# Patient Record
Sex: Female | Born: 1960 | Race: White | Hispanic: No | Marital: Married | Smoking: Former smoker
Health system: Southern US, Community
[De-identification: ages and names within clinical notes are randomized; demographics above are authoritative.]

## PROBLEM LIST (undated history)

## (undated) ENCOUNTER — Emergency Department (HOSPITAL_COMMUNITY): Admission: EM | Payer: Medicaid Other | Source: Home / Self Care

## (undated) ENCOUNTER — Emergency Department (HOSPITAL_COMMUNITY): Discharge status: Against Medical Advice | Payer: Self-pay

## (undated) DIAGNOSIS — G8929 Other chronic pain: Secondary | ICD-10-CM

## (undated) DIAGNOSIS — F329 Major depressive disorder, single episode, unspecified: Secondary | ICD-10-CM

## (undated) DIAGNOSIS — I639 Cerebral infarction, unspecified: Secondary | ICD-10-CM

## (undated) DIAGNOSIS — R569 Unspecified convulsions: Secondary | ICD-10-CM

## (undated) DIAGNOSIS — F32A Depression, unspecified: Secondary | ICD-10-CM

## (undated) DIAGNOSIS — F319 Bipolar disorder, unspecified: Secondary | ICD-10-CM

## (undated) DIAGNOSIS — F1210 Cannabis abuse, uncomplicated: Secondary | ICD-10-CM

## (undated) DIAGNOSIS — M797 Fibromyalgia: Secondary | ICD-10-CM

## (undated) DIAGNOSIS — R51 Headache: Secondary | ICD-10-CM

## (undated) DIAGNOSIS — K599 Functional intestinal disorder, unspecified: Secondary | ICD-10-CM

## (undated) DIAGNOSIS — J449 Chronic obstructive pulmonary disease, unspecified: Secondary | ICD-10-CM

## (undated) DIAGNOSIS — F4310 Post-traumatic stress disorder, unspecified: Secondary | ICD-10-CM

## (undated) DIAGNOSIS — R160 Hepatomegaly, not elsewhere classified: Secondary | ICD-10-CM

## (undated) DIAGNOSIS — K55039 Acute (reversible) ischemia of large intestine, extent unspecified: Secondary | ICD-10-CM

## (undated) DIAGNOSIS — K219 Gastro-esophageal reflux disease without esophagitis: Secondary | ICD-10-CM

## (undated) DIAGNOSIS — M1990 Unspecified osteoarthritis, unspecified site: Secondary | ICD-10-CM

## (undated) DIAGNOSIS — D509 Iron deficiency anemia, unspecified: Secondary | ICD-10-CM

## (undated) DIAGNOSIS — G459 Transient cerebral ischemic attack, unspecified: Secondary | ICD-10-CM

## (undated) DIAGNOSIS — R519 Headache, unspecified: Secondary | ICD-10-CM

## (undated) DIAGNOSIS — J852 Abscess of lung without pneumonia: Secondary | ICD-10-CM

## (undated) HISTORY — DX: Abscess of lung without pneumonia: J85.2

## (undated) HISTORY — DX: Post-traumatic stress disorder, unspecified: F43.10

## (undated) HISTORY — DX: Headache: R51

## (undated) HISTORY — DX: Cannabis abuse, uncomplicated: F12.10

## (undated) HISTORY — PX: VEIN SURGERY: SHX48

## (undated) HISTORY — DX: Bipolar disorder, unspecified: F31.9

## (undated) HISTORY — DX: Chronic obstructive pulmonary disease, unspecified: J44.9

## (undated) HISTORY — DX: Gastro-esophageal reflux disease without esophagitis: K21.9

## (undated) HISTORY — DX: Transient cerebral ischemic attack, unspecified: G45.9

## (undated) HISTORY — DX: Iron deficiency anemia, unspecified: D50.9

## (undated) HISTORY — DX: Other chronic pain: G89.29

## (undated) HISTORY — DX: Hepatomegaly, not elsewhere classified: R16.0

## (undated) HISTORY — PX: COLONOSCOPY: SHX174

## (undated) HISTORY — DX: Functional intestinal disorder, unspecified: K59.9

## (undated) HISTORY — DX: Headache, unspecified: R51.9

---

## 2007-02-16 ENCOUNTER — Emergency Department (HOSPITAL_COMMUNITY): Admission: EM | Admit: 2007-02-16 | Discharge: 2007-02-16 | Payer: Self-pay | Admitting: Emergency Medicine

## 2007-02-16 ENCOUNTER — Ambulatory Visit: Payer: Self-pay | Admitting: *Deleted

## 2007-03-03 DIAGNOSIS — I639 Cerebral infarction, unspecified: Secondary | ICD-10-CM

## 2007-03-03 HISTORY — DX: Cerebral infarction, unspecified: I63.9

## 2007-03-18 ENCOUNTER — Emergency Department (HOSPITAL_COMMUNITY): Admission: EM | Admit: 2007-03-18 | Discharge: 2007-03-18 | Payer: Self-pay | Admitting: Emergency Medicine

## 2007-04-01 ENCOUNTER — Emergency Department (HOSPITAL_COMMUNITY): Admission: EM | Admit: 2007-04-01 | Discharge: 2007-04-01 | Payer: Self-pay | Admitting: Emergency Medicine

## 2007-04-01 ENCOUNTER — Emergency Department (HOSPITAL_COMMUNITY): Admission: EM | Admit: 2007-04-01 | Discharge: 2007-04-01 | Payer: Self-pay | Admitting: Family Medicine

## 2007-04-22 ENCOUNTER — Ambulatory Visit: Payer: Self-pay | Admitting: Internal Medicine

## 2007-05-11 ENCOUNTER — Encounter (INDEPENDENT_AMBULATORY_CARE_PROVIDER_SITE_OTHER): Payer: Self-pay | Admitting: Internal Medicine

## 2007-05-11 ENCOUNTER — Ambulatory Visit: Payer: Self-pay | Admitting: Family Medicine

## 2007-05-11 LAB — CONVERTED CEMR LAB
ALT: 12 units/L (ref 0–35)
AST: 17 units/L (ref 0–37)
Basophils Absolute: 0 10*3/uL (ref 0.0–0.1)
Basophils Relative: 0 % (ref 0–1)
Benzodiazepines.: NEGATIVE
Chloride: 104 meq/L (ref 96–112)
Creatinine, Ser: 0.66 mg/dL (ref 0.40–1.20)
Hemoglobin: 16.2 g/dL — ABNORMAL HIGH (ref 12.0–15.0)
MCHC: 33.2 g/dL (ref 30.0–36.0)
Methadone: NEGATIVE
Monocytes Absolute: 1 10*3/uL (ref 0.1–1.0)
Neutro Abs: 5.6 10*3/uL (ref 1.7–7.7)
Platelets: 228 10*3/uL (ref 150–400)
Propoxyphene: NEGATIVE
RDW: 15.2 % (ref 11.5–15.5)
TSH: 2.596 microintl units/mL (ref 0.350–5.50)
Total Bilirubin: 0.5 mg/dL (ref 0.3–1.2)
Total CHOL/HDL Ratio: 2.6
VLDL: 22 mg/dL (ref 0–40)

## 2007-05-14 ENCOUNTER — Emergency Department (HOSPITAL_COMMUNITY): Admission: EM | Admit: 2007-05-14 | Discharge: 2007-05-14 | Payer: Self-pay | Admitting: Emergency Medicine

## 2007-05-18 ENCOUNTER — Ambulatory Visit: Payer: Self-pay | Admitting: Family Medicine

## 2007-06-17 ENCOUNTER — Encounter (INDEPENDENT_AMBULATORY_CARE_PROVIDER_SITE_OTHER): Payer: Self-pay | Admitting: Nurse Practitioner

## 2007-06-17 LAB — CONVERTED CEMR LAB
ALT: 8 units/L (ref 0–35)
AST: 13 units/L (ref 0–37)
Basophils Absolute: 0 10*3/uL (ref 0.0–0.1)
CO2: 22 meq/L (ref 19–32)
Chlamydia, Swab/Urine, PCR: POSITIVE — AB
Chloride: 106 meq/L (ref 96–112)
Cholesterol: 160 mg/dL (ref 0–200)
Eosinophils Relative: 5 % (ref 0–5)
GC Probe Amp, Urine: NEGATIVE
LH: 2.1 milliintl units/mL
Lymphocytes Relative: 37 % (ref 12–46)
Neutro Abs: 6.5 10*3/uL (ref 1.7–7.7)
Platelets: 366 10*3/uL (ref 150–400)
RDW: 13.5 % (ref 11.5–15.5)
Sodium: 139 meq/L (ref 135–145)
TSH: 2.351 microintl units/mL (ref 0.350–5.50)
Total Bilirubin: 0.4 mg/dL (ref 0.3–1.2)
Total Protein: 8.2 g/dL (ref 6.0–8.3)
VLDL: 10 mg/dL (ref 0–40)

## 2007-07-08 ENCOUNTER — Ambulatory Visit: Payer: Self-pay | Admitting: Internal Medicine

## 2007-07-11 ENCOUNTER — Ambulatory Visit: Payer: Self-pay | Admitting: Internal Medicine

## 2007-07-18 ENCOUNTER — Ambulatory Visit: Payer: Self-pay | Admitting: Internal Medicine

## 2007-07-20 ENCOUNTER — Ambulatory Visit (HOSPITAL_COMMUNITY): Admission: RE | Admit: 2007-07-20 | Discharge: 2007-07-20 | Payer: Self-pay | Admitting: Family Medicine

## 2007-07-22 ENCOUNTER — Ambulatory Visit (HOSPITAL_COMMUNITY): Admission: RE | Admit: 2007-07-22 | Discharge: 2007-07-22 | Payer: Self-pay | Admitting: Internal Medicine

## 2007-08-16 ENCOUNTER — Emergency Department (HOSPITAL_COMMUNITY): Admission: EM | Admit: 2007-08-16 | Discharge: 2007-08-16 | Payer: Self-pay | Admitting: Emergency Medicine

## 2007-08-18 ENCOUNTER — Encounter: Payer: Self-pay | Admitting: Family Medicine

## 2007-08-18 ENCOUNTER — Ambulatory Visit: Payer: Self-pay | Admitting: Internal Medicine

## 2007-08-18 LAB — CONVERTED CEMR LAB: Chlamydia, DNA Probe: NEGATIVE

## 2007-09-27 ENCOUNTER — Emergency Department (HOSPITAL_COMMUNITY): Admission: EM | Admit: 2007-09-27 | Discharge: 2007-09-27 | Payer: Self-pay | Admitting: Family Medicine

## 2007-09-28 ENCOUNTER — Ambulatory Visit (HOSPITAL_COMMUNITY): Admission: RE | Admit: 2007-09-28 | Discharge: 2007-09-28 | Payer: Self-pay | Admitting: Family Medicine

## 2007-10-25 ENCOUNTER — Ambulatory Visit (HOSPITAL_COMMUNITY): Admission: RE | Admit: 2007-10-25 | Discharge: 2007-10-25 | Payer: Self-pay | Admitting: Family Medicine

## 2007-12-09 ENCOUNTER — Emergency Department (HOSPITAL_COMMUNITY): Admission: EM | Admit: 2007-12-09 | Discharge: 2007-12-09 | Payer: Self-pay | Admitting: Family Medicine

## 2008-03-28 ENCOUNTER — Encounter: Admission: RE | Admit: 2008-03-28 | Discharge: 2008-03-28 | Payer: Self-pay | Admitting: Family Medicine

## 2008-04-18 ENCOUNTER — Encounter: Payer: Self-pay | Admitting: Pulmonary Disease

## 2008-04-26 ENCOUNTER — Encounter: Payer: Self-pay | Admitting: Pulmonary Disease

## 2008-05-01 ENCOUNTER — Encounter: Payer: Self-pay | Admitting: Pulmonary Disease

## 2008-05-02 ENCOUNTER — Emergency Department (HOSPITAL_COMMUNITY): Admission: EM | Admit: 2008-05-02 | Discharge: 2008-05-02 | Payer: Self-pay | Admitting: Emergency Medicine

## 2008-05-04 ENCOUNTER — Emergency Department (HOSPITAL_COMMUNITY): Admission: EM | Admit: 2008-05-04 | Discharge: 2008-05-04 | Payer: Self-pay | Admitting: Emergency Medicine

## 2008-05-11 ENCOUNTER — Ambulatory Visit: Payer: Self-pay | Admitting: Family Medicine

## 2008-05-14 ENCOUNTER — Encounter: Admission: RE | Admit: 2008-05-14 | Discharge: 2008-05-14 | Payer: Self-pay | Admitting: Neurology

## 2008-08-16 ENCOUNTER — Ambulatory Visit (HOSPITAL_COMMUNITY): Admission: RE | Admit: 2008-08-16 | Discharge: 2008-08-16 | Payer: Self-pay | Admitting: Family Medicine

## 2008-08-25 ENCOUNTER — Emergency Department (HOSPITAL_COMMUNITY): Admission: EM | Admit: 2008-08-25 | Discharge: 2008-08-25 | Payer: Self-pay | Admitting: Family Medicine

## 2008-08-26 ENCOUNTER — Inpatient Hospital Stay (HOSPITAL_COMMUNITY): Admission: EM | Admit: 2008-08-26 | Discharge: 2008-08-29 | Payer: Self-pay | Admitting: Emergency Medicine

## 2008-09-01 ENCOUNTER — Encounter: Payer: Self-pay | Admitting: Pulmonary Disease

## 2008-09-13 ENCOUNTER — Encounter: Payer: Self-pay | Admitting: Pulmonary Disease

## 2008-09-19 ENCOUNTER — Ambulatory Visit: Payer: Self-pay | Admitting: Internal Medicine

## 2008-09-21 ENCOUNTER — Encounter: Payer: Self-pay | Admitting: Pulmonary Disease

## 2008-09-24 ENCOUNTER — Ambulatory Visit: Payer: Self-pay | Admitting: Pulmonary Disease

## 2008-09-24 DIAGNOSIS — G459 Transient cerebral ischemic attack, unspecified: Secondary | ICD-10-CM | POA: Insufficient documentation

## 2008-09-24 DIAGNOSIS — J449 Chronic obstructive pulmonary disease, unspecified: Secondary | ICD-10-CM | POA: Insufficient documentation

## 2008-09-24 DIAGNOSIS — F4310 Post-traumatic stress disorder, unspecified: Secondary | ICD-10-CM | POA: Insufficient documentation

## 2008-09-24 DIAGNOSIS — R519 Headache, unspecified: Secondary | ICD-10-CM | POA: Insufficient documentation

## 2008-09-24 DIAGNOSIS — Z8679 Personal history of other diseases of the circulatory system: Secondary | ICD-10-CM | POA: Insufficient documentation

## 2008-09-24 DIAGNOSIS — R930 Abnormal findings on diagnostic imaging of skull and head, not elsewhere classified: Secondary | ICD-10-CM

## 2008-09-24 DIAGNOSIS — R51 Headache: Secondary | ICD-10-CM | POA: Insufficient documentation

## 2008-09-24 DIAGNOSIS — J45909 Unspecified asthma, uncomplicated: Secondary | ICD-10-CM | POA: Insufficient documentation

## 2008-09-26 ENCOUNTER — Ambulatory Visit: Admission: RE | Admit: 2008-09-26 | Discharge: 2008-09-26 | Payer: Self-pay | Admitting: Pulmonary Disease

## 2008-09-26 ENCOUNTER — Encounter: Payer: Self-pay | Admitting: Pulmonary Disease

## 2008-09-26 ENCOUNTER — Ambulatory Visit: Payer: Self-pay | Admitting: Pulmonary Disease

## 2008-09-28 ENCOUNTER — Telehealth: Payer: Self-pay | Admitting: Pulmonary Disease

## 2008-10-01 ENCOUNTER — Ambulatory Visit: Payer: Self-pay | Admitting: Internal Medicine

## 2008-10-02 ENCOUNTER — Telehealth (INDEPENDENT_AMBULATORY_CARE_PROVIDER_SITE_OTHER): Payer: Self-pay | Admitting: *Deleted

## 2008-10-03 ENCOUNTER — Ambulatory Visit: Payer: Self-pay | Admitting: Pulmonary Disease

## 2008-10-24 ENCOUNTER — Ambulatory Visit: Payer: Self-pay | Admitting: Pulmonary Disease

## 2008-10-24 DIAGNOSIS — J852 Abscess of lung without pneumonia: Secondary | ICD-10-CM | POA: Insufficient documentation

## 2008-10-24 DIAGNOSIS — R222 Localized swelling, mass and lump, trunk: Secondary | ICD-10-CM

## 2008-10-30 ENCOUNTER — Ambulatory Visit (HOSPITAL_COMMUNITY): Admission: RE | Admit: 2008-10-30 | Discharge: 2008-10-30 | Payer: Self-pay | Admitting: Pulmonary Disease

## 2008-10-30 ENCOUNTER — Telehealth: Payer: Self-pay | Admitting: Pulmonary Disease

## 2008-10-31 ENCOUNTER — Telehealth: Payer: Self-pay | Admitting: Pulmonary Disease

## 2008-10-31 ENCOUNTER — Encounter: Payer: Self-pay | Admitting: Pulmonary Disease

## 2008-11-06 ENCOUNTER — Telehealth: Payer: Self-pay | Admitting: Pulmonary Disease

## 2008-11-07 ENCOUNTER — Ambulatory Visit: Payer: Self-pay | Admitting: Pulmonary Disease

## 2008-11-08 ENCOUNTER — Emergency Department (HOSPITAL_COMMUNITY): Admission: EM | Admit: 2008-11-08 | Discharge: 2008-11-09 | Payer: Self-pay | Admitting: Emergency Medicine

## 2008-11-08 ENCOUNTER — Encounter: Payer: Self-pay | Admitting: Pulmonary Disease

## 2008-11-09 ENCOUNTER — Telehealth (INDEPENDENT_AMBULATORY_CARE_PROVIDER_SITE_OTHER): Payer: Self-pay | Admitting: *Deleted

## 2008-11-12 ENCOUNTER — Ambulatory Visit: Payer: Self-pay | Admitting: Internal Medicine

## 2008-11-15 ENCOUNTER — Ambulatory Visit: Payer: Self-pay | Admitting: Pulmonary Disease

## 2008-11-22 ENCOUNTER — Telehealth: Payer: Self-pay | Admitting: Pulmonary Disease

## 2008-11-26 ENCOUNTER — Ambulatory Visit: Payer: Self-pay | Admitting: Cardiology

## 2008-11-26 ENCOUNTER — Telehealth: Payer: Self-pay | Admitting: Pulmonary Disease

## 2008-11-27 ENCOUNTER — Telehealth (INDEPENDENT_AMBULATORY_CARE_PROVIDER_SITE_OTHER): Payer: Self-pay | Admitting: *Deleted

## 2008-11-27 ENCOUNTER — Telehealth: Payer: Self-pay | Admitting: Pulmonary Disease

## 2008-11-28 ENCOUNTER — Telehealth (INDEPENDENT_AMBULATORY_CARE_PROVIDER_SITE_OTHER): Payer: Self-pay | Admitting: *Deleted

## 2008-12-03 ENCOUNTER — Telehealth: Payer: Self-pay | Admitting: Internal Medicine

## 2008-12-19 ENCOUNTER — Emergency Department (HOSPITAL_COMMUNITY): Admission: EM | Admit: 2008-12-19 | Discharge: 2008-12-20 | Payer: Self-pay | Admitting: Emergency Medicine

## 2009-01-11 ENCOUNTER — Encounter: Payer: Self-pay | Admitting: Pulmonary Disease

## 2009-01-11 ENCOUNTER — Ambulatory Visit: Payer: Self-pay | Admitting: Pulmonary Disease

## 2009-05-09 ENCOUNTER — Ambulatory Visit: Payer: Self-pay | Admitting: Pulmonary Disease

## 2009-05-09 ENCOUNTER — Encounter: Payer: Self-pay | Admitting: Pulmonary Disease

## 2009-06-05 ENCOUNTER — Encounter: Payer: Self-pay | Admitting: Nurse Practitioner

## 2009-07-18 ENCOUNTER — Encounter: Payer: Self-pay | Admitting: Pulmonary Disease

## 2009-08-19 ENCOUNTER — Encounter: Admission: RE | Admit: 2009-08-19 | Discharge: 2009-08-19 | Payer: Self-pay | Admitting: General Practice

## 2009-09-24 ENCOUNTER — Telehealth (INDEPENDENT_AMBULATORY_CARE_PROVIDER_SITE_OTHER): Payer: Self-pay | Admitting: *Deleted

## 2009-09-30 ENCOUNTER — Encounter: Payer: Self-pay | Admitting: Nurse Practitioner

## 2009-11-14 ENCOUNTER — Encounter: Payer: Self-pay | Admitting: Nurse Practitioner

## 2009-11-18 ENCOUNTER — Encounter: Payer: Self-pay | Admitting: Nurse Practitioner

## 2009-11-19 ENCOUNTER — Telehealth (INDEPENDENT_AMBULATORY_CARE_PROVIDER_SITE_OTHER): Payer: Self-pay | Admitting: *Deleted

## 2009-11-19 ENCOUNTER — Encounter (INDEPENDENT_AMBULATORY_CARE_PROVIDER_SITE_OTHER): Payer: Self-pay | Admitting: *Deleted

## 2009-11-20 ENCOUNTER — Ambulatory Visit: Payer: Self-pay | Admitting: Internal Medicine

## 2009-11-20 DIAGNOSIS — D509 Iron deficiency anemia, unspecified: Secondary | ICD-10-CM

## 2009-11-20 DIAGNOSIS — K219 Gastro-esophageal reflux disease without esophagitis: Secondary | ICD-10-CM

## 2009-11-21 ENCOUNTER — Telehealth: Payer: Self-pay | Admitting: Nurse Practitioner

## 2009-11-25 ENCOUNTER — Ambulatory Visit: Payer: Self-pay | Admitting: Internal Medicine

## 2009-11-28 ENCOUNTER — Ambulatory Visit: Payer: Self-pay | Admitting: Pulmonary Disease

## 2010-03-03 ENCOUNTER — Encounter (INDEPENDENT_AMBULATORY_CARE_PROVIDER_SITE_OTHER): Payer: Self-pay | Admitting: *Deleted

## 2010-03-14 ENCOUNTER — Telehealth: Payer: Self-pay | Admitting: Internal Medicine

## 2010-03-14 ENCOUNTER — Encounter (INDEPENDENT_AMBULATORY_CARE_PROVIDER_SITE_OTHER): Payer: Self-pay | Admitting: *Deleted

## 2010-03-23 ENCOUNTER — Encounter: Payer: Self-pay | Admitting: Obstetrics and Gynecology

## 2010-03-24 ENCOUNTER — Encounter (INDEPENDENT_AMBULATORY_CARE_PROVIDER_SITE_OTHER): Payer: Self-pay | Admitting: *Deleted

## 2010-03-24 ENCOUNTER — Encounter: Payer: Self-pay | Admitting: Family Medicine

## 2010-03-24 ENCOUNTER — Encounter: Payer: Self-pay | Admitting: Pulmonary Disease

## 2010-04-01 NOTE — Assessment & Plan Note (Signed)
Summary: rov for copd   Copy to:  [facility] Primary Provider/Referring Provider:  Dr. Alfonse Ras  CC:  Pt is here for a 4 month f/u appt.  Pt states breathing is " alittle" better since last ov- still has sob with exertion.  Pt c/o coughing up clear to yellow sputum and dizziness and "light headedness."  Pt states she is currently smoking greater than 1 ppd.  Pt would like rx for Chantix to help quit smoking. Marland Kitchen  History of Present Illness: the pt comes in today for f/u of her known emphysema and lung abscesses.  Her doe is at its usual baseline, but she is continuing to smoke greater than one ppd.  She has ongoing congestion, with intermittantly discolored mucus.  She does not feel she has a chest cold.  She has not had any further f/c/sweats.  There has been no hemoptysis.  She has continued to put back on her weight that she had lost.  Medications Prior to Update: 1)  Advair Diskus 250-50 Mcg/dose Misc (Fluticasone-Salmeterol) .... Inhale 1 Puff Two Times A Day 2)  Proair Hfa 108 (90 Base) Mcg/act  Aers (Albuterol Sulfate) .Marland Kitchen.. 1-2 Puffs Every 4-6 Hours As Needed 3)  Albuterol Sulfate (2.5 Mg/85ml) 0.083% Nebu (Albuterol Sulfate) .Marland Kitchen.. 1 Vial in Nebulizer Every 4 To 6 Hours As Needed 4)  Spiriva Handihaler 18 Mcg  Caps (Tiotropium Bromide Monohydrate) .... Inhale Contents of 1 Capsule Once A Day 5)  Tizanidine Hcl 4 Mg Tabs (Tizanidine Hcl) .... Take 1 Tablet By Mouth Two Times A Day As Needed 6)  Vistaril 50 Mg Caps (Hydroxyzine Pamoate) .... Take 1 Tablet By Mouth Three Times A Day 7)  Topamax 25 Mg Tabs (Topiramate) .... Take 1 Tab By Mouth 3 To 4 Times Per Day 8)  Xyzal 5 Mg Tabs (Levocetirizine Dihydrochloride) .... Take 1 Tablet By Mouth Once A Day 9)  Ibuprofen 800 Mg Tabs (Ibuprofen) .... Take 1 Tablet By Mouth Three Times A Day 10)  Elestat 0.05 % Soln (Epinastine Hcl) .Marland Kitchen.. 1 Drop in Each Eye Once Daily 11)  Freshkote 2.7-2 % Soln (Polyvin Alc-Povidon-Dimethylam) .... Use As  Needed 12)  Omeprazole 20 Mg Cpdr (Omeprazole) .Marland Kitchen.. 1-2 Tabs Once A Day 13)  Flexeril 10 Mg Tabs (Cyclobenzaprine Hcl) .... Take 1 Tablet By Mouth Two Times A Day As Needed 14)  Sudafed Triple Action 30-325-200 Mg Tabs (Pseudoephed-Apap-Guaifenesin) .... Take 1 Tablet By Mouth Two Times A Day  Allergies (verified): 1)  ! Tylox 2)  ! Codeine 3)  ! Penicillin 4)  ! Clindamycin  Social History: Patient states currently smoker.r. started at age 7.  Currently smoking approx. greater than 1 ppd.   pt is divorced. pt has 4 sons.  2 are alive and 2 deceased. pt is on disability.  previously worked as a Actuary care.   cats  Review of Systems      See HPI  Vital Signs:  Patient profile:   50 year old female Height:      67.5 inches Weight:      151.50 pounds BMI:     23.46 O2 Sat:      94 % on Room air Temp:     98.1 degrees F oral Pulse rate:   84 / minute BP sitting:   120 / 78  (left arm) Cuff size:   regular  Vitals Entered By: Arman Filter LPN (May 09, 2009 3:17 PM)  O2 Flow:  Room air CC: Pt  is here for a 4 month f/u appt.  Pt states breathing is " alittle" better since last ov- still has sob with exertion.  Pt c/o coughing up clear to yellow sputum, dizziness and "light headedness."  Pt states she is currently smoking greater than 1 ppd.  Pt would like rx for Chantix to help quit smoking.  Comments Medications reviewed with patient Arman Filter LPN  May 09, 2009 3:19 PM    Physical Exam  General:  thin female in nad Nose:  no purulence noted. Lungs:  mild decrease in bs, no wheezing Heart:  rrr, no mrg Extremities:  no edema or cyanosis Neurologic:  alert and oriented, moves all 4.   Impression & Recommendations:  Problem # 1:  LUNG ABSCESS (ICD-513.0)  the pt's xray today is much improved, and she denies any symptoms suggestive of ongoing pulmonary infection.  I suspect her current cxr represents her new baseline.  Problem # 2:  C O P D  (ICD-496) the pt is on a very good bronchodilator regimen, but continues to have symptoms due to her ongoing smoking.  I have told her things will never improve until she totally stops smoking.  I am very hesitant to prescribe chantix given her multiple neurologic/psychiatric meds, and have recommended instead nicotine gum and smoking cesssation classes.  I have given her samples of the gum, and instructed her on how to use.  I have also given her invitations to the smoking cessation classes at [facility].  Other Orders: Est. Patient Level III (52841) Tobacco use cessation intermediate 3-10 minutes (32440)  Patient Instructions: 1)  no change in pulmonary meds 2)  you must stop smoking 3)  can try nicorette gum 4mg .  Chew one piece every 1-2 hrs, so that you chew at least 9 pieces a day for the first 6 weeks. 4)  followup with me in 6mos.  Prescriptions: XYZAL 5 MG TABS (LEVOCETIRIZINE DIHYDROCHLORIDE) Take 1 tablet by mouth once a day  #90 x 0   Entered and Authorized by:   Barbaraann Share MD   Signed by:   Barbaraann Share MD on 05/09/2009   Method used:   Print then Give to Patient   RxID:   [id number]

## 2010-04-01 NOTE — Letter (Signed)
Summary: [facility] Instructions  [facility]  [address] [place], [place] [address]   Phone: [phone]  Fax: [phone]       [name]    [birth year]    MRN: [id number]        Procedure Day /Date: 11-25-20     Arrival Time:3:00 PM      Procedure Time: 4:00 PM     Location of Procedure:                    X      [facility] (4th Floor) PREPARATION FOR COLONOSCOPY WITH MOVIPREP   Starting 5 days prior to your procedure 9-21-11do not eat nuts, seeds, popcorn, corn, beans, peas,  salads, or any raw vegetables.  Do not take any fiber supplements (e.g. Metamucil, Citrucel, and Benefiber).  THE DAY BEFORE YOUR PROCEDURE         DATE: 11-24-09  DAY: Sunday  1.  Drink clear liquids the entire day-NO SOLID FOOD  2.  Do not drink anything colored red or purple.  Avoid juices with pulp.  No orange juice.  3.  Drink at least 64 oz. (8 glasses) of fluid/clear liquids during the day to prevent dehydration and help the prep work efficiently.  CLEAR LIQUIDS INCLUDE: Water Jello Ice Popsicles Tea (sugar ok, no milk/cream) Powdered fruit flavored drinks Coffee (sugar ok, no milk/cream) Gatorade Juice: apple, white grape, white cranberry  Lemonade Clear bullion, consomm, broth Carbonated beverages (any kind) Strained chicken noodle soup Hard Candy                             4.  In the morning, mix first dose of MoviPrep solution:    Empty 1 Pouch A and 1 Pouch B into the disposable container    Add lukewarm drinking water to the top line of the container. Mix to dissolve    Refrigerate (mixed solution should be used within 24 hrs)  5.  Begin drinking the prep at 5:00 p.m. The MoviPrep container is divided by 4 marks.   Every 15 minutes drink the solution down to the next mark (approximately 8 oz) until the full liter is complete.   6.  Follow completed prep with 16 oz of clear liquid of your choice (Nothing red or purple).  Continue to drink  clear liquids until bedtime.  7.  Before going to bed, mix second dose of MoviPrep solution:    Empty 1 Pouch A and 1 Pouch B into the disposable container    Add lukewarm drinking water to the top line of the container. Mix to dissolve    Refrigerate  THE DAY OF YOUR PROCEDURE      DATE: 11-25-09 DAY: Monday  Beginning at 11:00 Am  (5 hours before procedure):         1. Every 15 minutes, drink the solution down to the next mark (approx 8 oz) until the full liter is complete.  2. Follow completed prep with 16 oz. of clear liquid of your choice.    3. You may drink clear liquids until 2:00 PM (2 HOURS BEFORE PROCEDURE).   MEDICATION INSTRUCTIONS  Unless otherwise instructed, you should take regular prescription medications with a small sip of water   as early as possible the morning of your procedure.      OTHER INSTRUCTIONS  You will need a responsible adult at least 50 years of  age to accompany you and drive you home.   This person must remain in the waiting room during your procedure.  Wear loose fitting clothing that is easily removed.  Leave jewelry and other valuables at home.  However, you may wish to bring a book to read or  an iPod/MP3 player to listen to music as you wait for your procedure to start.  Remove all body piercing jewelry and leave at home.  Total time from sign-in until discharge is approximately 2-3 hours.  You should go home directly after your procedure and rest.  You can resume normal activities the  day after your procedure.  The day of your procedure you should not:   Drive   Make legal decisions   Operate machinery   Drink alcohol   Return to work  You will receive specific instructions about eating, activities and medications before you leave.    The above instructions have been reviewed and explained to me by   _______________________    I fully understand and can verbalize these instructions _____________________________  Date _________

## 2010-04-01 NOTE — Letter (Signed)
Summary: [facility]  [facility]   Imported By: Lester Fairfield 12/25/2009 10:27:29  _____________________________________________________________________  External Attachment:    Type:   Image     Comment:   External Document

## 2010-04-01 NOTE — Assessment & Plan Note (Signed)
Summary: RIGHT UPPER QUAD PAIN...AS.        (NEW TO GI)   History of Present Illness Visit Type: Initial Consult Primary GI MD: [name] MD Primary Provider: Dr. Alfonse Ras Requesting Provider: Darrin Nipper, MD Chief Complaint: RIght sided abd pain x 5-6 months. Pt states she has been very constipated for 6 months. Pt has taken Miralax, fiber supplements, enemas, MOM without any improvement. Pt did have a abd film done at Dr. Donovan Kail office that showed a completely blocked bowel. Pt started Lactulose, and continued Miralax, and Benefiber with a tiny BM on Saturday but not a complete BM. History of Present Illness:   Patient is a 50 year old female here for evaluation of a 4-5 month history of right mid abdominal pain and constipation. Abdominal xray at [facility] revealed marked stool and gas in right colon (records reviewed). Defecation improves pain to some degree. Taking Mirlax, MOM and Lactulose but still constipated.  No recent medication changes or dietary changes. No rectal bleeding.  Patient takes a daily baby ASA and infrequently takes Ketorolac. She has menstrual cycles every 40 days or so with light bleeding. No weight loss, in fact has gained weight. Some nausea without vomiting. Patient gives history of chronic GERD with regurgitation and heartburn. Takes daily PPI.    GI Review of Systems    Reports abdominal pain, bloating, and  nausea.     Location of  Abdominal pain: right side.    Denies acid reflux, belching, chest pain, dysphagia with liquids, dysphagia with solids, heartburn, loss of appetite, vomiting, vomiting blood, weight loss, and  weight gain.      Reports change in bowel habits and  constipation.     Denies anal fissure, black tarry stools, diarrhea, diverticulosis, fecal incontinence, heme positive stool, hemorrhoids, irritable bowel syndrome, jaundice, light color stool, liver problems, rectal bleeding, and  rectal pain.    Current Medications (verified): 1)   Advair Diskus 250-50 Mcg/dose Misc (Fluticasone-Salmeterol) .... Inhale 1 Puff Two Times A Day 2)  Spiriva Handihaler 18 Mcg  Caps (Tiotropium Bromide Monohydrate) .... Inhale Contents of 1 Capsule Once A Day 3)  Xyzal 5 Mg Tabs (Levocetirizine Dihydrochloride) .... Take 1 Tablet By Mouth Once A Day 4)  Omeprazole 20 Mg Cpdr (Omeprazole) .Marland Kitchen.. 1-2 Tabs Once A Day 5)  Miralax  Powd (Polyethylene Glycol 3350) .... One Capful Once Daily 6)  Benefiber  Powd (Wheat Dextrin) .Marland Kitchen.. 1-2 Capful By Mouth Once Daily 7)  Lactulose  Powd (Lactulose) .... 2 Tbsp  Every 2 Hours 8)  Diazepam 10 Mg Tabs (Diazepam) .... 2 Tablets By Mouth Once Daily As Needed 9)  Cardizem Cd 180 Mg Xr24h-Cap (Diltiazem Hcl Coated Beads) .... One Capsule By Mouth Once Daily 10)  Aspirin 81 Mg  Tabs (Aspirin) .... One Tablet By Mouth Once Daily 11)  Advair Diskus 250-50 Mcg/dose Aepb (Fluticasone-Salmeterol) .... As Needed 12)  B-100  Tabs (Vitamins-Lipotropics) .... One Tablet By Mouth Once Daily 13)  Vitamin B-6 100 Mg Tabs (Pyridoxine Hcl) .... One Tablet By Mouth Once Daily 14)  Vitamin B-12 100 Mcg Tabs (Cyanocobalamin) .... One Tablet By Mouth Once Daily 15)  Vitamin C 500 Mg  Tabs (Ascorbic Acid) .... One Tablet By Mouth Once Daily 16)  Vitamin E 600 Unit  Caps (Vitamin E) .... One Tablet By Mouth Once Daily 17)  Vitamin D 1000 Unit  Tabs (Cholecalciferol) .... One Tablet By Mouth Once Daily 18)  Glucosamine 500 Mg Caps (Glucosamine Sulfate) .... One  Capsule By Mouth Once Daily 19)  Ketorolac Tromethamine 10 Mg Tabs (Ketorolac Tromethamine) .... One Tablet By Mouth Once Daily As Needed  Allergies: 1)  ! Tylox 2)  ! Codeine  Past History:  Past Medical History: HEADACHE, CHRONIC (ICD-784.0) TIA (ICD-435.9)?? POST TRAUMATIC STRESS SYNDROME (ICD-309.81) ANGINA, HX OF (ICD-V12.50) BIPOLAR DISORDER COPD GERD  Past Surgical History: Reviewed history from 10/03/2008 and no changes required. vein transplant 2 C  section  Family History: Reviewed history from 09/24/2008 and no changes required. emphysema: father  asthma: son clotting disorders: mother rheumatism: sister, brother cancer: sister (ovarian/cervical)   Social History: Reviewed history from 05/09/2009 and no changes required. Patient states currently smoker.r. started at age 76.  Currently smoking approx. greater than 1 ppd.   pt is divorced. pt has 4 sons.  2 are alive and 2 deceased. pt is on disability.  previously worked as a Actuary care.   cats  Review of Systems  The patient denies allergy/sinus, anemia, anxiety-new, arthritis/joint pain, back pain, blood in urine, breast changes/lumps, change in vision, confusion, cough, coughing up blood, depression-new, fainting, fatigue, fever, headaches-new, hearing problems, heart murmur, heart rhythm changes, itching, menstrual pain, muscle pains/cramps, night sweats, nosebleeds, pregnancy symptoms, shortness of breath, skin rash, sleeping problems, sore throat, swelling of feet/legs, swollen lymph glands, thirst - excessive , urination - excessive , urination changes/pain, urine leakage, vision changes, and voice change.    Vital Signs:  Patient profile:   50 year old female Height:      67.5 inches Weight:      148.25 pounds BMI:     22.96 Pulse rate:   100 / minute Pulse rhythm:   regular BP sitting:   106 / 64  (left arm) Cuff size:   regular  Vitals Entered By: Christie Nottingham CMA Duncan Dull) (November 20, 2009 3:07 PM)  Physical Exam  General:  Well developed, well nourished, no acute distress. Head:  Normocephalic and atraumatic. Eyes:  Conjunctiva pink, no icterus.  Mouth:  No oral lesions. Tongue moist.  Neck:  ? thyroid Lungs:  Clear throughout to auscultation. Heart:  Regular rate and rhythm; no murmurs, rubs,  or bruits. Abdomen:  Soft, slightly distended with normal bowel sounds. No tenderness, no obvious masses. Rectal:  No external or internal  lesions. Light brown, heme negative stool. Msk:  Symmetrical with no gross deformities. Normal posture. Extremities:  No palmar erythema, no edema.  Neurologic:  Alert and  oriented x4;  grossly normal neurologically. Skin:  Intact without significant lesions or rashes. Cervical Nodes:  No significant cervical adenopathy. Psych:  Alert and cooperative. Normal mood and affect.   Impression & Recommendations:  Problem # 1:  CHANGE IN BOWELS (ZOX-096.04) Assessment New Four to five month history of severe constipation despite Miralax, MOM, and Lactulose. She is hemoccult negative today. Need to exclude colon neoplasm.  For further evaluation the patient will be scheduled for a colonoscopy with biopsies/polypectomy (if indicated).  The risks and benefits of the procedure, as well as alternatives were discussed with the patient and she agrees to proceed. Needs clear liquids for two days prior to procedure as she will likely be difficult to clean out. Add daily Dulcolax suppositories. Will call Dr. Donovan Kail office for copies of recent labwork.   Orders: Colonoscopy (Colon)  Problem # 2:  C O P D (ICD-496) Assessment: Comment Only  Problem # 3:  GERD (ICD-530.81) Assessment: Unchanged Continue daily PPI.  Assessment: New  Patient Instructions: 1)  Continue the Miralax. 17 grams in 8 0z of water or clear juice twice daily, AM and PM.  2)  Use Dulcolax suppositories 1 every morning. 3)  We have scheduled the Colonscopy with Dr [name] for 11-25-09. 4)  Directions and brochure provided. 5)  Be on clear liquids on 9-24 and 9-25.  6)  [facility] Patient Information Guide given to patient. 7)  Copy sent to : Darrin Nipper, MD 8)  The medication list was reviewed and reconciled.  All changed / newly prescribed medications were explained.  A complete medication list was provided to the patient / caregiver. Prescriptions: DULCOLAX 5 MG  TBEC (BISACODYL) Day before procedure  take 2 at 3pm and 2 at 8pm.  #4 x 0   Entered by:   Lowry Ram NCMA   Authorized by:   Willette Cluster NP   Signed by:   Lowry Ram NCMA on 11/20/2009   Method used:   Electronically to        [facility] [phone]* (retail)       [address]       [place], [place]  [address]       Ph: [phone]       Fax: [phone]   RxID:   [id number] REGLAN 10 MG  TABS (METOCLOPRAMIDE HCL) As per prep instructions.  #2 x 0   Entered by:   Lowry Ram NCMA   Authorized by:   Willette Cluster NP   Signed by:   Lowry Ram NCMA on 11/20/2009   Method used:   Electronically to        [facility] [phone] (retail)       [address]       [place], [place]  [address]       Ph: [phone]       Fax: [phone]   RxID:   [id number] MIRALAX   POWD (POLYETHYLENE GLYCOL 3350) As per prep  instructions.  #255gm x 0   Entered by:   Lowry Ram NCMA   Authorized by:   Willette Cluster NP   Signed by:   Lowry Ram NCMA on 11/20/2009   Method used:   Electronically to        [facility] [phone]* (retail)       [address]       [place], [place]  [address]       Ph: [phone]       Fax: [phone]   RxID:   [id number]  Called [facility] and cancelled the above prescriptions.  I ordered Moviprep over the phone to the pharmacist, [facility] [address] Lowry Ram CMA

## 2010-04-01 NOTE — Letter (Signed)
Summary: [facility]  [facility]   Imported By: Lester Nobles 12/25/2009 10:29:13  _____________________________________________________________________  External Attachment:    Type:   Image     Comment:   External Document

## 2010-04-01 NOTE — Assessment & Plan Note (Signed)
Summary: rov for emphysema and h/o lung abscess.   Copy to:  Darrin Nipper, MD Primary Provider/Referring Provider:  Dr. Alfonse Ras  CC:  follow up. Pt states her breathing has been doing a little better. Pt c/o of occasional cough with yellow phlem.  Pt states she is smoking 1 pdd. Pt states the nicorette gum did not help her. Pt states it made her crave nicotine more. Marland Kitchen  History of Present Illness: the pt comes in today for f/u of her known emphysema and h/o lung abscess.  She has been doing well from a lung abscess standpoint, with no further left sided chest pain, and no purulence of significance noted.  She unfortunately continues to smoke, and is taking advair but not spiriva regularly.  She feels that her exertional tolerance is reasonable and stable.    Current Medications (verified): 1)  Advair Diskus 250-50 Mcg/dose Misc (Fluticasone-Salmeterol) .... Inhale 1 Puff Two Times A Day 2)  Spiriva Handihaler 18 Mcg  Caps (Tiotropium Bromide Monohydrate) .... Inhale Contents of 1 Capsule Once A Day 3)  Xyzal 5 Mg Tabs (Levocetirizine Dihydrochloride) .... Take 1 Tablet By Mouth Once A Day 4)  Omeprazole 20 Mg Cpdr (Omeprazole) .Marland Kitchen.. 1-2 Tabs Once A Day 5)  Benefiber  Powd (Wheat Dextrin) .Marland Kitchen.. 1-2 Capful By Mouth Once Daily 6)  Diazepam 10 Mg Tabs (Diazepam) .... 2 Tablets By Mouth Once Daily As Needed 7)  Cardizem Cd 180 Mg Xr24h-Cap (Diltiazem Hcl Coated Beads) .... One Capsule By Mouth Once Daily 8)  Aspirin 81 Mg  Tabs (Aspirin) .... One Tablet By Mouth Once Daily 9)  B-100  Tabs (Vitamins-Lipotropics) .... One Tablet By Mouth Once Daily 10)  Vitamin B-6 100 Mg Tabs (Pyridoxine Hcl) .... One Tablet By Mouth Once Daily 11)  Vitamin B-12 100 Mcg Tabs (Cyanocobalamin) .... One Tablet By Mouth Once Daily 12)  Vitamin C 500 Mg  Tabs (Ascorbic Acid) .... One Tablet By Mouth Once Daily 13)  Vitamin E 600 Unit  Caps (Vitamin E) .... One Tablet By Mouth Once Daily 14)  Vitamin D 1000 Unit  Tabs  (Cholecalciferol) .... One Tablet By Mouth Once Daily 15)  Glucosamine 500 Mg Caps (Glucosamine Sulfate) .... One Capsule By Mouth Once Daily 16)  Ketorolac Tromethamine 10 Mg Tabs (Ketorolac Tromethamine) .... One Tablet By Mouth Once Daily As Needed 17)  Reglan 10 Mg  Tabs (Metoclopramide Hcl) .... As Per Prep Instructions. 18)  Miralax   Powd (Polyethylene Glycol 3350) .... Mix 17 Grams in A Glass of Water, Twice A Day. 19)  Ventolin Hfa 108 (90 Base) Mcg/act Aers (Albuterol Sulfate) .Marland Kitchen.. 1-2 Puffs Every 4-6 Hrs As Needed 20)  Nasonex 50 Mcg/act Susp (Mometasone Furoate) .... 2 Sprays Each Nostrile Once Daily  Allergies (verified): 1)  ! Tylox 2)  ! Codeine  Review of Systems       The patient complains of shortness of breath with activity, shortness of breath at rest, productive cough, non-productive cough, acid heartburn, indigestion, nasal congestion/difficulty breathing through nose, sneezing, and joint stiffness or pain.  The patient denies coughing up blood, chest pain, irregular heartbeats, loss of appetite, weight change, abdominal pain, difficulty swallowing, sore throat, tooth/dental problems, headaches, itching, ear ache, anxiety, depression, hand/feet swelling, rash, change in color of mucus, and fever.    Vital Signs:  Patient profile:   50 year old female Height:      67.5 inches Weight:      147.38 pounds BMI:  22.82 O2 Sat:      97 % on Room air Temp:     98.6 degrees F oral Pulse rate:   96 / minute BP sitting:   130 / 74  (right arm) Cuff size:   regular  Vitals Entered By: Carver Fila (November 28, 2009 4:03 PM)  O2 Flow:  Room air CC: follow up. Pt states her breathing has been doing a little better. Pt c/o of occasional cough with yellow phlem.  Pt states she is smoking 1 pdd. Pt states the nicorette gum did not help her. Pt states it made her crave nicotine more.  Comments meds and allergies updated Phone number updated Carver Fila  November 28, 2009  4:03 PM    Physical Exam  General:  thin female in nad Lungs:  decreased bs, no wheezing, no rhonchi Heart:  rrr, no mrg Extremities:  no edema or cyanosis  Neurologic:  alert and oriented, moves all 4.   Impression & Recommendations:  Problem # 1:  C O P D (ICD-496) the pt continues to smoke, and needs to be more compliant with her bronchodilator use.  Despite this, she appears to be near her usual baseline.  I have encouraged her again to stop smoking, and also asked her to be working on some type of exercise program.  She needs to also increase her calorie intake.  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  Will check one more cxr for completeness.  Medications Added to Medication List This Visit: 1)  Ventolin Hfa 108 (90 Base) Mcg/act Aers (Albuterol sulfate) .Marland Kitchen.. 1-2 puffs every 4-6 hrs as needed 2)  Nasonex 50 Mcg/act Susp (Mometasone furoate) .... 2 sprays each nostrile once daily  Other Orders: Est. Patient Level III ([address]) Admin 1st Vaccine (60454) Flu Vaccine 63yrs + (09811) T-2 View CXR (71020TC)  Patient Instructions: 1)  will check cxr today, and call you with results. 2)  will give you the flu shot 3)  stop smoking!!! 4)  stay on spiriva and advair every day.   5)  followup with me in 6mos.   Immunization History:  Influenza Immunization History:    Influenza:  historical (01/11/2009)         Flu Vaccine Consent Questions     Do you have a history of severe allergic reactions to this vaccine? no    Any prior history of allergic reactions to egg and/or gelatin? no    Do you have a sensitivity to the preservative Thimersol? no    Do you have a past history of Guillan-Barre Syndrome? no    Do you currently have an acute febrile illness? no    Have you ever had a severe reaction to latex? no    Vaccine information given and explained to patient? yes    Are you currently pregnant? no    Lot Number:[id number]   Exp  Date:08/30/2010   Site Given  Left Deltoid IMflu   Carver Fila  November 28, 2009 4:36 PM

## 2010-04-01 NOTE — Letter (Signed)
Summary: New Patient letter  [facility]  [address] [place], [place] [address]   Phone: [phone]  Fax: [phone]       11/19/2009 MRN: [id number]  [name] [address] [place], [place]  [address]  Dear [name],  Welcome to the Gastroenterology Division at [facility].    You are scheduled to see Dr. Jarold Motto on 01/03/2010 at 9:30AM on the 3rd floor at [facility], [address]  We ask that you try to arrive at our office 15 minutes prior to your appointment time to allow for check-in.  We would like you to complete the enclosed self-administered evaluation form prior to your visit and bring it with you on the day of your appointment.  We will review it with you.  Also, please bring a complete list of all your medications or, if you prefer, bring the medication bottles and we will list them.  Please bring your insurance card so that we may make a copy of it.  If your insurance requires a referral to see a specialist, please bring your referral form from your primary care physician.  Co-payments are due at the time of your visit and may be paid by cash, check or credit card.     Your office visit will consist of a consult with your physician (includes a physical exam), any laboratory testing he/she may order, scheduling of any necessary diagnostic testing (e.g. x-ray, ultrasound, CT-scan), and scheduling of a procedure (e.g. Endoscopy, Colonoscopy) if required.  Please allow enough time on your schedule to allow for any/all of these possibilities.    If you cannot keep your appointment, please call [phone] to cancel or reschedule prior to your appointment date.  This allows [place] the opportunity to schedule an appointment for another patient in need of care.  If you do not cancel or reschedule by 5 p.m. the business day prior to your appointment date, you will be charged a $50.00 late cancellation/no-show fee.    Thank you for choosing  [facility] for your medical needs.  We appreciate the opportunity to care for you.  Please visit [place] at our website  to learn more about our practice.                     Sincerely,                                                             The Gastroenterology Division

## 2010-04-01 NOTE — Procedures (Signed)
Summary: Colonoscopy  Patient: Sya Nestler Note: All result statuses are Final unless otherwise noted.  Tests: (1) Colonoscopy (COL)   COL Colonoscopy           DONE     [facility]     [address]     [place], [place]  [address]           COLONOSCOPY PROCEDURE REPORT           PATIENT:  Jernee, Murtaugh  MR#:  [id number]     BIRTHDATE:  [birth year], 49 yrs. old  GENDER:  female     ENDOSCOPIST:  Hedwig Morton. Juanda Chance, MD     REF. BY: DR Alfonse Ras, Dr Dell Ponto     PROCEDURE DATE:  11/25/2009     PROCEDURE:  Colonoscopy [address]     ASA CLASS:  Class II     INDICATIONS:  Abdominal pain, Abnormal CT of abdomen constipationx     5 months, RUQ abd. pain     MEDICATIONS:   Versed 10 mg, Fentanyl 125 mcg, Benadryl 25 mg           DESCRIPTION OF PROCEDURE:   After the risks benefits and     alternatives of the procedure were thoroughly explained, informed     consent was obtained.  No rectal exam performed. The LB [id number]-[id number]     [id number] endoscope was introduced through the anus and advanced to     the cecum, which was identified by both the appendix and ileocecal     valve, without limitations.  The quality of the prep was good,     using MiraLax.  The instrument was then slowly withdrawn as the     colon was fully examined.     <<PROCEDUREIMAGES>>           FINDINGS:  No polyps or cancers were seen (see image1, image2,     image3, image4, image5, image6, image7, image8, and image9).     mildly dilated colon, redundant especially right side and splenic     flexure, difficult intubation   Retroflexed views in the rectum     revealed no abnormalities.    The scope was then withdrawn from     the patient and the procedure completed.           COMPLICATIONS:  None     ENDOSCOPIC IMPRESSION:     1) No polyps or cancers     redundant slightly dilated colon, suspect colonic inertia     RECOMMENDATIONS:     1) high fiber diet     continue Miralax 17gm qd or bid, consider Amitiza     needs an  OV follow up to modify her bowl program.     REPEAT EXAM:  In 10 year(s) for.           ______________________________     Hedwig Morton. Juanda Chance, MD           CC:           n.     eSIGNED:   Hedwig Morton. Brodie at 11/25/2009 05:22 PM           Otilio Saber, [id number]  Note: An exclamation mark (!) indicates a result that was not dispersed into the flowsheet. Document Creation Date: 11/25/2009 5:23 PM _______________________________________________________________________  (1) Order result status: Final Collection or observation date-time: 11/25/2009 17:10 Requested date-time:  Receipt date-time:  Reported date-time:  Referring Physician:   Ordering Physician: Verlee Monte  Juanda Chance [phone]) Specimen Source:  Source: Launa Grill Order Number: [id number] Lab site:   Appended Document: Colonoscopy    Clinical Lists Changes  Observations: Added new observation of COLONNXTDUE: 11/2019 (11/25/2009 17:50)

## 2010-04-01 NOTE — Progress Notes (Signed)
Summary: Triage  Phone Note Call from Patient Call back at Home Phone [phone]   Caller: Patient Call For: Willette Cluster Reason for Call: Talk to Nurse Summary of Call: pt is sch'd for COL on 11-25-09 and has some questions about her prep and being "impacted" Initial call taken by: Karna Christmas,  November 21, 2009 2:03 PM  Follow-up for Phone Call        Pt had questions about takint Lactulose along with the Miralax.  Gunnar Fusi said she could but not to take the Lactulose on Sun when you do the prep. Follow-up by: Joselyn Glassman,  November 21, 2009 4:38 PM

## 2010-04-01 NOTE — Miscellaneous (Signed)
Summary: Orders Update  Clinical Lists Changes  Orders: Added new Test order of T-2 View CXR (71020TC) - Signed 

## 2010-04-01 NOTE — Miscellaneous (Signed)
Summary: Miralax RX  Clinical Lists Changes  Medications: Added new medication of MIRALAX   POWD (POLYETHYLENE GLYCOL 3350) Mix 17 grams in a glass of water, twice a day. - Signed Rx of MIRALAX   POWD (POLYETHYLENE GLYCOL 3350) Mix 17 grams in a glass of water, twice a day.;  #522 grams x 12;  Signed;  Entered by: Durwin Glaze RN;  Authorized by: Hart Carwin MD;  Method used: Electronically to [facility] [address] [phone]*, [address] [place], [place]  [address], Ph: [phone], Fax: [phone]    Prescriptions: MIRALAX   POWD (POLYETHYLENE GLYCOL 3350) Mix 17 grams in a glass of water, twice a day.  #522 grams x 12   Entered by:   Durwin Glaze RN   Authorized by:   Hart Carwin MD   Signed by:   Durwin Glaze RN on 11/25/2009   Method used:   Electronically to        [facility] [address] [phone]* (retail)       [address]       [place], [place]  [address]       Ph: [phone]       Fax: [phone]   RxID:   [id number]

## 2010-04-01 NOTE — Letter (Signed)
Summary: [facility]  [facility]   Imported By: Lester [place] 12/25/2009 10:30:43  _____________________________________________________________________  External Attachment:    Type:   Image     Comment:   External Document

## 2010-04-01 NOTE — Progress Notes (Signed)
Summary: Triage-Sooner Appt.  Phone Note Call from Patient Call back at Home Phone [phone]   Caller: Patient Call For:  any doc Reason for Call: Talk to Nurse Summary of Call: Patient wants to be seen sooner than 11-4 for severe abd and rectal pain and Lactulose is not helping her. Initial call taken by: Tawni Levy,  November 19, 2009 1:45 PM  Follow-up for Phone Call        Message left for patient to callback. Laureen Ochs LPN  November 19, 2009 1:55 PM   Pt'a appt. has been moved to 11-20-09 at 3pm. Pt. advised of med.list/co-pay. If symptoms become worse call back immediately or go to ER.  Follow-up by: Laureen Ochs LPN,  November 19, 2009 2:07 PM

## 2010-04-01 NOTE — Progress Notes (Signed)
Summary: pulmonary rehab  Phone Note Call from Patient Call back at Home Phone [phone]   Caller: Patient Call For: clance Summary of Call: pt wants to see about getting into pulmonary rehab Initial call taken by: Lacinda Axon,  September 24, 2009 12:14 PM  Follow-up for Phone Call        called and spoke with pt.  pt would like to be referred to pulm rehab at [facility].  will forward message to [facility] to address.  Aundra Millet Reynolds LPN  September 24, 2009 12:36 PM   Additional Follow-up for Phone Call Additional follow up Details #1::        I do not see where we have ever done breathing tests on her.  You can only qualify for pulmonary rehab with significant lung disease.  please see if she has ever had pfts in the past..Marland KitchenI can;t find in computer.   Additional Follow-up by: Barbaraann Share MD,  September 24, 2009 12:56 PM    Additional Follow-up for Phone Call Additional follow up Details #2::    called and spoke with pt and she stated that she had a breathing test done in the last month at Dr. Louanna Raw office---called Dr. [name] office and they  will have to look for this and will fax it to [place]. Randell Loop CMA  September 24, 2009 2:17 PM    received paperwork and put in [facility] very important look at folder.  Aundra Millet Reynolds LPN  September 24, 2009 3:10 PM   Additional Follow-up for Phone Call Additional follow up Details #3:: Details for Additional Follow-up Action Taken: pfts reviewed.  Is a candidate for pulmonary rehab.  let her know I will send an order to [facility]  Called, spoke with pt.  Pt informed per [facility], she is a canadiate for pulmonary rehab and order was sent to [facility] for this.  She verbalized understanding. Gweneth Dimitri RN  September 26, 2009 8:44 AM

## 2010-04-03 NOTE — Progress Notes (Signed)
Summary: Triage  Phone Note Call from Patient Call back at Home Phone [phone]   Caller: Patient Call For: Dr. Juanda Chance Reason for Call: Talk to Nurse Summary of Call: Pt has an impacted bowel, saw her primary today and had x-rays that showed it, having a lot of pain aswell, wants to speak wtih nurse Initial call taken by: [place] Johnson,  March 14, 2010 4:18 PM  Follow-up for Phone Call        Patient calling because she has "impacted bowel" States she went to her PCP and had an ultrasound that showed she was impacted. States she is taking Miralax two times a day. Pain for the last 2 weeks in her right side in the belly button area that is getting worse. States her last BM was yesterday. Dr. Juanda Chance notified of above. Per Dr. Juanda Chance- Magnesium Citrate 1 bottle daily until bowels are moving better.Patient aware. Follow-up by: Jesse Fall RN,  March 14, 2010 4:54 PM

## 2010-04-16 ENCOUNTER — Encounter (INDEPENDENT_AMBULATORY_CARE_PROVIDER_SITE_OTHER): Payer: Self-pay | Admitting: *Deleted

## 2010-04-18 ENCOUNTER — Telehealth: Payer: Self-pay | Admitting: Internal Medicine

## 2010-04-18 DIAGNOSIS — F319 Bipolar disorder, unspecified: Secondary | ICD-10-CM | POA: Insufficient documentation

## 2010-04-18 DIAGNOSIS — Z8719 Personal history of other diseases of the digestive system: Secondary | ICD-10-CM | POA: Insufficient documentation

## 2010-04-21 ENCOUNTER — Ambulatory Visit: Payer: Self-pay | Admitting: Internal Medicine

## 2010-04-23 NOTE — Letter (Signed)
Summary: [facility]-CT ABD/PELVIS  [facility] A[facility]   Imported By: Lamona Curl CMA (AAMA) 04/18/2010 14:25:49  _____________________________________________________________________  External Attachment:    Type:   Image     Comment:   External Document

## 2010-04-23 NOTE — Letter (Signed)
Summary: [facility]-Abdominal Ultrasound  [facility]   Imported By: Lamona Curl CMA (AAMA) 04/18/2010 16:14:16  _____________________________________________________________________  External Attachment:    Type:   Image     Comment:   External Document

## 2010-04-23 NOTE — Progress Notes (Addendum)
Summary: Triage  Phone Note Call from Patient Call back at Home Phone [phone]   Caller: Patient Call For: Dr. Juanda Chance Reason for Call: Talk to Nurse Summary of Call: Pt says her bowel is still impacted, had CT scan that is being sent to [place] by her primary doctor, wants to be seen and something done about this because drink mag citrate is not helping and she si having a lot of pain Initial call taken by: [place] Johnson,  April 18, 2010 10:12 AM  Follow-up for Phone Call        Patient c/o continued constipation.  She states that Dr Onalee Hua id a CT on her and she has a fecal impaction again.  She has been taking a bottle of Mag Citrate daily and htis is not helping.  Patient is advised to hold Mag Citrate.  I have advised her to take 2 dulcolax tablets and then 1 hour later begin 17 Gm Miralax in 8 oz of clear liquids every 15 minutes for 6-8 glasses with last glass please take 2 more Dulcolax.  She refused appointment with Willette Cluster RNP for today.  She did take an appointment with Dr Juanda Chance for Monday at 8:30.  She is c/o right sided abdominal pain.  She is also asked to contact Dr Onalee Hua to have CT scan faxed here. Follow-up by: Darcey Nora RN, CGRN,  April 18, 2010 12:16 PM  Additional Follow-up for Phone Call Additional follow up Details #1::        OK,  i will see her, and will review her CT scan Additional Follow-up by: Hart Carwin MD,  April 18, 2010 7:45 PM     Appended Document: Triage patient canceled the appointment for this am via the on call MD

## 2010-04-23 NOTE — Letter (Signed)
Summary: [facility] Office Note  [facility] Office Note   Imported By: Lamona Curl CMA (AAMA) 04/18/2010 16:13:42  _____________________________________________________________________  External Attachment:    Type:   Image     Comment:   External Document

## 2010-04-23 NOTE — Letter (Signed)
Summary: [facility] Office Note  [facility] Office Note   Imported By: Lamona Curl CMA (AAMA) 04/18/2010 16:14:39  _____________________________________________________________________  External Attachment:    Type:   Image     Comment:   External Document

## 2010-05-01 ENCOUNTER — Encounter (HOSPITAL_COMMUNITY): Payer: Self-pay

## 2010-05-04 ENCOUNTER — Emergency Department (HOSPITAL_COMMUNITY): Payer: Medicaid Other

## 2010-05-04 ENCOUNTER — Telehealth: Payer: Self-pay | Admitting: Internal Medicine

## 2010-05-04 ENCOUNTER — Emergency Department (HOSPITAL_COMMUNITY)
Admission: EM | Admit: 2010-05-04 | Discharge: 2010-05-04 | Disposition: A | Discharge status: Home / Self Care | Payer: Medicaid Other | Attending: Emergency Medicine | Admitting: Emergency Medicine

## 2010-05-04 DIAGNOSIS — R509 Fever, unspecified: Secondary | ICD-10-CM | POA: Insufficient documentation

## 2010-05-04 DIAGNOSIS — R112 Nausea with vomiting, unspecified: Secondary | ICD-10-CM | POA: Insufficient documentation

## 2010-05-04 DIAGNOSIS — K5900 Constipation, unspecified: Secondary | ICD-10-CM | POA: Insufficient documentation

## 2010-05-05 ENCOUNTER — Telehealth: Payer: Self-pay | Admitting: Internal Medicine

## 2010-05-06 ENCOUNTER — Telehealth: Payer: Self-pay | Admitting: Internal Medicine

## 2010-05-13 NOTE — Progress Notes (Signed)
Summary: Questions  Phone Note Call from Patient Call back at Home Phone [phone]   Caller: Patient Call For: Dr. Juanda Chance Reason for Call: Talk to Nurse Summary of Call: Patient needs to speak with nurse again about medication being called in for her Initial call taken by: [place] Johnson,  May 06, 2010 11:26 AM  Follow-up for Phone Call        Patient calling because she is concerned about not taking laxative for five days after taking the sitz marker pill. She states she does not go at all without Miralax and this worries her. Explained to patient that this is how the test is done to see what her body is doing.  Follow-up by: Jesse Fall RN,  May 06, 2010 11:56 AM  Additional Follow-up for Phone Call Additional follow up Details #1::        I agree, she needs to stay OFF laxatives for 5 days. Additional Follow-up by: Hart Carwin MD,  May 06, 2010 10:17 PM

## 2010-05-13 NOTE — Progress Notes (Signed)
Summary: triage  Phone Note Call from Patient Call back at Home Phone [phone]   Caller: Patient Call For: Dr Juanda Chance Reason for Call: Talk to Nurse Summary of Call: Patient wants to speak to nurse regarding rx Dr Leone Payor set to her this weekend. Initial call taken by: Tawni Levy,  May 05, 2010 11:17 AM  Follow-up for Phone Call        Patient calling because her brother mixed her Colyte for her and he mixed it in [employer] aid. She states the instructions say NOT to mix this with a flavored drink. She is wanting to know if she can get a new rx and mix it correctly with water. Follow-up by: Jesse Fall RN,  May 05, 2010 12:04 PM  Additional Follow-up for Phone Call Additional follow up Details #1::        it should work with Kool-Aid just fine. Additional Follow-up by: Hart Carwin MD,  May 05, 2010 1:06 PM     Appended Document: triage Called patient and she has already poured out the Colyte. May I reorder it?  Appended Document: triage May reorder per Dr Juanda Chance. Rx sent to pharmacy   Clinical Lists Changes  Medications: Rx of COLYTE WITH FLAVOR PACKS 240 GM SOLR (PEG 3350-KCL-NABCB-NACL-NASULF) drink 4 liters over 4-5 hours (try for 8 oz every 15 minutes to treat severe constipation;  #1 x 0;  Signed;  Entered by: Jesse Fall RN;  Authorized by: Hart Carwin MD;  Method used: Electronically to [facility] [address] [phone]*, [address] [place], [place]  [address], Ph: [phone], Fax: [phone]    Prescriptions: COLYTE WITH FLAVOR PACKS 240 GM SOLR (PEG 3350-KCL-NABCB-NACL-NASULF) drink 4 liters over 4-5 hours (try for 8 oz every 15 minutes to treat severe constipation  #1 x 0   Entered by:   Jesse Fall RN   Authorized by:   Hart Carwin MD   Signed by:   Jesse Fall RN on 05/05/2010   Method used:   Electronically to        [facility] [address] [address] [phone]* (retail)       [address]       [place], [place]  [address]       Ph: [phone]  Fax: [phone]   RxID:   [id number]    Appended Document: triage Left a message for patient as per her request that a new rx has been sent.

## 2010-05-13 NOTE — Progress Notes (Signed)
Summary: constipation and abd pain  Phone Note Call from Patient   Summary of Call: having persistent constipation and RUQ pain as before despite using dulcolax, enemas and daily MiraLax. Missed appt late Feb due to laryngitis. Things are really an exacerbation of chronic problems. CT, [place], colonoscopy recently, I reviewed. 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 Plan: PEG-based lavage today will forward note to Dr. Juanda Chance to review and for further recomendations. Initial call taken by: Iva Boop MD, Clementeen Graham,  May 04, 2010 2:21 PM    New/Updated Medications: COLYTE WITH FLAVOR PACKS 240 GM SOLR (PEG 3350-KCL-NABCB-NACL-NASULF) drink 4 liters over 4-5 hours (try for 8 oz every 15 minutes to treat severe constipation Prescriptions: COLYTE WITH FLAVOR PACKS 240 GM SOLR (PEG 3350-KCL-NABCB-NACL-NASULF) drink 4 liters over 4-5 hours (try for 8 oz every 15 minutes to treat severe constipation  #1 x 0   Entered and Authorized by:   Iva Boop MD, [facility]   Signed by:   Iva Boop MD, FACG on 05/04/2010   Method used:   Electronically to        [facility] [address] [address] [id number]* (retail)       [address]       [place], [place]  [address]       Ph: [phone]       Fax: [phone]   RxID:   [id number]   Appended Document: constipation and abd pain reviewed, I would like to do a Stz mark transit time  test, to get an idea  whay we are dealing with. Rene Kocher, please schedule, she will not be able to take any laxatives for 5 days whilr doing the transit study. I will see her in the office to discuss options.  Appended Document: constipation and abd pain Spoke with patient. She is having problems Medicaid card and will need to schedule after she gets her card straightend out probably around April 1st.

## 2010-05-26 ENCOUNTER — Encounter: Payer: Self-pay | Admitting: Pulmonary Disease

## 2010-05-28 ENCOUNTER — Ambulatory Visit: Payer: Self-pay | Admitting: Pulmonary Disease

## 2010-06-02 ENCOUNTER — Telehealth: Payer: Self-pay | Admitting: *Deleted

## 2010-06-02 NOTE — Telephone Encounter (Signed)
Spoke with patient re: sitz markers. She wants to call back and set it up after she checks on someone giving her a ride to our office.

## 2010-06-05 ENCOUNTER — Ambulatory Visit: Payer: Self-pay | Admitting: Pulmonary Disease

## 2010-06-06 LAB — COMPREHENSIVE METABOLIC PANEL
ALT: 14 U/L (ref 0–35)
AST: 17 U/L (ref 0–37)
Albumin: 3.5 g/dL (ref 3.5–5.2)
CO2: 27 mEq/L (ref 19–32)
Chloride: 100 mEq/L (ref 96–112)
GFR calc Af Amer: >60 mL/min (ref >60)
GFR calc non Af Amer: >60 mL/min (ref >60)
Potassium: 3.2 mEq/L — ABNORMAL LOW (ref 3.5–5.1)
Sodium: 134 mEq/L — ABNORMAL LOW (ref 135–145)
Total Bilirubin: 0.2 mg/dL — ABNORMAL LOW (ref 0.3–1.2)

## 2010-06-06 LAB — DIFFERENTIAL
Basophils Absolute: 0 10*3/uL (ref 0.0–0.1)
Eosinophils Absolute: 0.1 10*3/uL (ref 0.0–0.7)
Eosinophils Relative: 1 % (ref 0–5)
Lymphs Abs: 3.1 10*3/uL (ref 0.7–4.0)
Monocytes Absolute: 0.9 10*3/uL (ref 0.1–1.0)

## 2010-06-06 LAB — POCT CARDIAC MARKERS
CKMB, poc: 1.1 ng/mL (ref 1.0–8.0)
Troponin i, poc: <0.05 ng/mL (ref 0.00–0.09)

## 2010-06-06 LAB — CBC
Platelets: 348 10*3/uL (ref 150–400)
RBC: 4.34 MIL/uL (ref 3.87–5.11)
WBC: 11.8 10*3/uL — ABNORMAL HIGH (ref 4.0–10.5)

## 2010-06-07 LAB — GLUCOSE, CAPILLARY: Glucose-Capillary: 96 mg/dL (ref 70–99)

## 2010-06-08 LAB — CULTURE, RESPIRATORY W GRAM STAIN: Special Requests: ABNORMAL

## 2010-06-08 LAB — AFB STAIN: Acid Fast Smear: NONE SEEN

## 2010-06-08 LAB — MISCELLANEOUS TEST

## 2010-06-08 LAB — FUNGUS CULTURE W SMEAR
Fungal Smear: NONE SEEN
Special Requests: ABNORMAL

## 2010-06-09 ENCOUNTER — Telehealth: Payer: Self-pay | Admitting: Pulmonary Disease

## 2010-06-09 ENCOUNTER — Telehealth: Payer: Self-pay | Admitting: Internal Medicine

## 2010-06-09 ENCOUNTER — Encounter: Payer: Self-pay | Admitting: Pulmonary Disease

## 2010-06-09 DIAGNOSIS — K5900 Constipation, unspecified: Secondary | ICD-10-CM

## 2010-06-09 LAB — BLOOD GAS, ARTERIAL
Acid-base deficit: 8.5 mmol/L — ABNORMAL HIGH (ref 0.0–2.0)
Bicarbonate: 17.5 mEq/L — ABNORMAL LOW (ref 20.0–24.0)
Drawn by: 145321
Expiratory PAP: 5
FIO2: 0.5 %
FIO2: 0.5 %
O2 Saturation: 98.6 %
Patient temperature: 98.6
RATE: 15 resp/min
TCO2: 15.5 mmol/L (ref 0–100)
pCO2 arterial: 42.3 mmHg (ref 35.0–45.0)
pH, Arterial: 7.266 — ABNORMAL LOW (ref 7.350–7.400)
pH, Arterial: 7.305 — ABNORMAL LOW (ref 7.350–7.400)
pO2, Arterial: 144 mmHg — ABNORMAL HIGH (ref 80.0–100.0)

## 2010-06-09 LAB — URINALYSIS, ROUTINE W REFLEX MICROSCOPIC
Glucose, UA: NEGATIVE mg/dL
Hgb urine dipstick: NEGATIVE
Ketones, ur: NEGATIVE mg/dL
Protein, ur: NEGATIVE mg/dL

## 2010-06-09 LAB — CULTURE, BLOOD (SINGLE)

## 2010-06-09 LAB — BASIC METABOLIC PANEL
BUN: 10 mg/dL (ref 6–23)
BUN: 10 mg/dL (ref 6–23)
CO2: 18 mEq/L — ABNORMAL LOW (ref 19–32)
CO2: 22 mEq/L (ref 19–32)
CO2: 24 mEq/L (ref 19–32)
Calcium: 7.9 mg/dL — ABNORMAL LOW (ref 8.4–10.5)
Calcium: 8.5 mg/dL (ref 8.4–10.5)
Calcium: 9.3 mg/dL (ref 8.4–10.5)
Chloride: 112 mEq/L (ref 96–112)
Creatinine, Ser: 0.55 mg/dL (ref 0.4–1.2)
Creatinine, Ser: 0.66 mg/dL (ref 0.4–1.2)
Creatinine, Ser: 0.74 mg/dL (ref 0.4–1.2)
Creatinine, Ser: 0.75 mg/dL (ref 0.4–1.2)
GFR calc Af Amer: >60 mL/min (ref >60)
GFR calc Af Amer: >60 mL/min (ref >60)
GFR calc Af Amer: >60 mL/min (ref >60)
GFR calc non Af Amer: >60 mL/min (ref >60)
Glucose, Bld: 123 mg/dL — ABNORMAL HIGH (ref 70–99)
Glucose, Bld: 127 mg/dL — ABNORMAL HIGH (ref 70–99)
Glucose, Bld: 165 mg/dL — ABNORMAL HIGH (ref 70–99)
Sodium: 139 mEq/L (ref 135–145)

## 2010-06-09 LAB — CULTURE, BLOOD (ROUTINE X 2): Culture: NO GROWTH

## 2010-06-09 LAB — CBC
HCT: 40.3 % (ref 36.0–46.0)
Hemoglobin: 11.9 g/dL — ABNORMAL LOW (ref 12.0–15.0)
MCHC: 33.1 g/dL (ref 30.0–36.0)
MCHC: 33.5 g/dL (ref 30.0–36.0)
MCHC: 33.7 g/dL (ref 30.0–36.0)
MCV: 94.4 fL (ref 78.0–100.0)
Platelets: 155 10*3/uL (ref 150–400)
Platelets: 156 10*3/uL (ref 150–400)
Platelets: 187 10*3/uL (ref 150–400)
RBC: 3.98 MIL/uL (ref 3.87–5.11)
RDW: 13.9 % (ref 11.5–15.5)
RDW: 14.1 % (ref 11.5–15.5)
RDW: 14.1 % (ref 11.5–15.5)
WBC: 10.9 10*3/uL — ABNORMAL HIGH (ref 4.0–10.5)
WBC: 9.1 10*3/uL (ref 4.0–10.5)

## 2010-06-09 LAB — HEMOGLOBIN A1C
Hgb A1c MFr Bld: 5.6 % (ref 4.6–6.1)
Mean Plasma Glucose: 114 mg/dL

## 2010-06-09 LAB — DIFFERENTIAL
Basophils Absolute: 0 10*3/uL (ref 0.0–0.1)
Eosinophils Relative: 0 % (ref 0–5)
Lymphocytes Relative: 4 % — ABNORMAL LOW (ref 12–46)
Lymphs Abs: 0.4 10*3/uL — ABNORMAL LOW (ref 0.7–4.0)
Neutro Abs: 9.9 10*3/uL — ABNORMAL HIGH (ref 1.7–7.7)
Neutrophils Relative %: 91 % — ABNORMAL HIGH (ref 43–77)

## 2010-06-09 LAB — COMPREHENSIVE METABOLIC PANEL
ALT: 12 U/L (ref 0–35)
AST: 21 U/L (ref 0–37)
Albumin: 2.4 g/dL — ABNORMAL LOW (ref 3.5–5.2)
Chloride: 104 mEq/L (ref 96–112)
Creatinine, Ser: 1.13 mg/dL (ref 0.4–1.2)
GFR calc Af Amer: >60 mL/min (ref >60)
Sodium: 133 mEq/L — ABNORMAL LOW (ref 135–145)
Total Bilirubin: 0.6 mg/dL (ref 0.3–1.2)

## 2010-06-09 LAB — URINE CULTURE
Colony Count: NO GROWTH
Culture: NO GROWTH
Special Requests: NEGATIVE

## 2010-06-09 LAB — PHOSPHORUS: Phosphorus: 2.4 mg/dL (ref 2.3–4.6)

## 2010-06-09 NOTE — Telephone Encounter (Signed)
Pt aware KC has no available appts for 06/13/2010 and rescheduled for Wed., 07/02/2010.

## 2010-06-09 NOTE — Telephone Encounter (Signed)
Reviewed and agree.

## 2010-06-09 NOTE — Telephone Encounter (Signed)
Patient is due for sitz marks not capsule endoscopy.  She will come by today after 2 pm and ask for me.  She will come on Friday for KUB

## 2010-06-10 ENCOUNTER — Ambulatory Visit: Payer: Self-pay | Admitting: Pulmonary Disease

## 2010-06-11 ENCOUNTER — Telehealth: Payer: Self-pay | Admitting: *Deleted

## 2010-06-11 NOTE — Telephone Encounter (Signed)
See other note from 06/03/10

## 2010-06-11 NOTE — Telephone Encounter (Signed)
Left a message for patient to see if she has a ride lined up so she can do the sitz markers.

## 2010-06-11 NOTE — Telephone Encounter (Signed)
Left patient a message to see if she has arranged a ride yet.

## 2010-06-12 LAB — POCT I-STAT, CHEM 8
BUN: 11 mg/dL (ref 6–23)
Calcium, Ion: 1.16 mmol/L (ref 1.12–1.32)
Chloride: 104 mEq/L (ref 96–112)
Creatinine, Ser: 0.8 mg/dL (ref 0.4–1.2)
Glucose, Bld: 113 mg/dL — ABNORMAL HIGH (ref 70–99)
HCT: 49 % — ABNORMAL HIGH (ref 36.0–46.0)
Potassium: 3.7 mEq/L (ref 3.5–5.1)

## 2010-06-12 LAB — DIFFERENTIAL
Lymphocytes Relative: 18 % (ref 12–46)
Lymphs Abs: 1.1 10*3/uL (ref 0.7–4.0)
Lymphs Abs: 2.1 10*3/uL (ref 0.7–4.0)
Monocytes Absolute: 0.7 10*3/uL (ref 0.1–1.0)
Monocytes Relative: 11 % (ref 3–12)
Monocytes Relative: 13 % — ABNORMAL HIGH (ref 3–12)
Neutro Abs: 4.6 10*3/uL (ref 1.7–7.7)
Neutro Abs: 3.6 10*3/uL (ref 1.7–7.7)
Neutrophils Relative %: 70 % (ref 43–77)
Neutrophils Relative %: 54 % (ref 43–77)

## 2010-06-12 LAB — COMPREHENSIVE METABOLIC PANEL
ALT: 16 U/L (ref 0–35)
Albumin: 4 g/dL (ref 3.5–5.2)
BUN: 10 mg/dL (ref 6–23)
Calcium: 9.1 mg/dL (ref 8.4–10.5)
Glucose, Bld: 86 mg/dL (ref 70–99)
Sodium: 138 mEq/L (ref 135–145)
Total Protein: 7 g/dL (ref 6.0–8.3)

## 2010-06-12 LAB — URINALYSIS, ROUTINE W REFLEX MICROSCOPIC
Bilirubin Urine: NEGATIVE
Glucose, UA: NEGATIVE mg/dL
Hgb urine dipstick: NEGATIVE
Ketones, ur: NEGATIVE mg/dL
Nitrite: NEGATIVE
Specific Gravity, Urine: 1.01 (ref 1.005–1.030)
pH: 6.5 (ref 5.0–8.0)

## 2010-06-12 LAB — CBC
Hemoglobin: 15.6 g/dL — ABNORMAL HIGH (ref 12.0–15.0)
Hemoglobin: 15.7 g/dL — ABNORMAL HIGH (ref 12.0–15.0)
MCHC: 34.8 g/dL (ref 30.0–36.0)
MCHC: 33.4 g/dL (ref 30.0–36.0)
Platelets: 209 10*3/uL (ref 150–400)
RBC: 4.9 MIL/uL (ref 3.87–5.11)
RDW: 14.7 % (ref 11.5–15.5)
WBC: 6.5 10*3/uL (ref 4.0–10.5)

## 2010-06-12 LAB — RAPID URINE DRUG SCREEN, HOSP PERFORMED
Benzodiazepines: NOT DETECTED
Cocaine: NOT DETECTED
Opiates: NOT DETECTED
Tetrahydrocannabinol: POSITIVE — AB

## 2010-06-12 LAB — VALPROIC ACID LEVEL: Valproic Acid Lvl: <10 ug/mL — ABNORMAL LOW (ref 50.0–100.0)

## 2010-06-13 ENCOUNTER — Ambulatory Visit (INDEPENDENT_AMBULATORY_CARE_PROVIDER_SITE_OTHER)
Admission: RE | Admit: 2010-06-13 | Discharge: 2010-06-13 | Disposition: A | Discharge status: Home / Self Care | Payer: Medicaid Other | Source: Ambulatory Visit | Attending: Internal Medicine | Admitting: Internal Medicine

## 2010-06-13 DIAGNOSIS — K5900 Constipation, unspecified: Secondary | ICD-10-CM

## 2010-06-16 ENCOUNTER — Telehealth: Payer: Self-pay | Admitting: Internal Medicine

## 2010-06-16 NOTE — Telephone Encounter (Signed)
Spoke with patient and gave her the results of KUB/sitz markers. Patient instructed to add Dulcolax tab in AM with Miralax. Scheduled patient for OV on 06/26/10 at 3:00 PM.

## 2010-06-16 NOTE — Telephone Encounter (Signed)
Patient had KUB

## 2010-06-17 ENCOUNTER — Telehealth: Payer: Self-pay | Admitting: Internal Medicine

## 2010-06-17 NOTE — Telephone Encounter (Signed)
Spoke with patient and she states she is having nausea since she had to stop her constipation medications for the sitz marker test. States in the past she has used Phenergan supp 25 mg and it helped. States she has vomited x1. She is using Miralax and Dulcolax to get her bowels going again. [facility] [employer]. Please, advise.

## 2010-06-17 NOTE — Telephone Encounter (Signed)
OK to send Phenergan 25 mg, tab or supp, ,#10, 1 q 6 hrs prn nausea

## 2010-06-17 NOTE — Telephone Encounter (Signed)
Left message for patient to call me

## 2010-06-18 MED ORDER — PROMETHAZINE HCL 25 MG RE SUPP: 25.0000 mg | Freq: Four times a day (QID) | RECTAL | Status: DC | PRN

## 2010-06-18 NOTE — Telephone Encounter (Signed)
Left a message for patient that rx has been sent to her pharmacy

## 2010-06-24 ENCOUNTER — Other Ambulatory Visit (HOSPITAL_COMMUNITY): Payer: Self-pay | Admitting: *Deleted

## 2010-06-26 ENCOUNTER — Ambulatory Visit: Payer: Medicaid Other | Admitting: Internal Medicine

## 2010-06-30 ENCOUNTER — Encounter: Payer: Self-pay | Admitting: Pulmonary Disease

## 2010-06-30 ENCOUNTER — Ambulatory Visit (HOSPITAL_COMMUNITY)
Admission: RE | Admit: 2010-06-30 | Payer: Medicaid Other | Source: Ambulatory Visit | Attending: *Deleted | Admitting: *Deleted

## 2010-07-02 ENCOUNTER — Ambulatory Visit (HOSPITAL_COMMUNITY)
Admission: RE | Admit: 2010-07-02 | Payer: Medicaid Other | Source: Ambulatory Visit | Attending: *Deleted | Admitting: *Deleted

## 2010-07-02 ENCOUNTER — Ambulatory Visit (INDEPENDENT_AMBULATORY_CARE_PROVIDER_SITE_OTHER): Payer: Medicaid Other | Admitting: Pulmonary Disease

## 2010-07-02 ENCOUNTER — Encounter: Payer: Self-pay | Admitting: Pulmonary Disease

## 2010-07-02 VITALS — BP 106/60 | HR 85 | Temp 98.5°F | Ht 67.5 in | Wt 140.0 lb

## 2010-07-02 DIAGNOSIS — J449 Chronic obstructive pulmonary disease, unspecified: Secondary | ICD-10-CM

## 2010-07-02 DIAGNOSIS — R930 Abnormal findings on diagnostic imaging of skull and head, not elsewhere classified: Secondary | ICD-10-CM

## 2010-07-02 MED ORDER — NICOTINE 14 MG/24HR TD PT24: 1.0000 | MEDICATED_PATCH | TRANSDERMAL | Status: AC

## 2010-07-02 NOTE — Progress Notes (Signed)
  Subjective:    Patient ID: [name], female    DOB: 1961-01-28, 50 y.o.   MRN: [id number]  HPI The pt comes in today for f/u of her known severe obstructive disease.  She is maintaining on her meds, but unfortunately continues to smoke.  She denies any chest congestion or purulence, and feels her breathing is a little better from the last visit. She is eating well, but has lost some weight compared to last visit.   Review of Systems  Constitutional: Positive for fever. Negative for unexpected weight change.  HENT: Positive for ear pain, congestion, sneezing and sinus pressure. Negative for nosebleeds, sore throat, rhinorrhea, trouble swallowing, dental problem and postnasal drip.   Eyes: Positive for redness and itching.  Respiratory: Positive for cough, shortness of breath and wheezing. Negative for chest tightness.   Cardiovascular: Negative for leg swelling.  Gastrointestinal: Positive for nausea. Negative for vomiting.  Genitourinary: Positive for dysuria.  Musculoskeletal: Negative for joint swelling.  Skin: Negative for rash.  Neurological: Positive for headaches.  Hematological: Bruises/bleeds easily.  Psychiatric/Behavioral: Negative for dysphoric mood. The patient is nervous/anxious.        Objective:   Physical Exam Thin female in nad Chest with mildly decreased bs, no wheezing Cor with rrr LE without edema, no cyanosis  Alert and oriented, moves all 4        Assessment & Plan:

## 2010-07-02 NOTE — Patient Instructions (Signed)
Stay on spiriva and advair Stop smoking.  Will try nicotine patch 14mg  one daily followup with me in 6mos

## 2010-07-06 NOTE — Assessment & Plan Note (Signed)
The pt feels that she is better to stable on her current bronchodilator regimen.  I have told her the most important thing that she can do is quit smoking.  Failure to do so will result in worsening lung disease and disability.  The pt states she will try harder.  I have asked her to stay on her current meds, and try and get some kind of conditioning.

## 2010-07-15 NOTE — H&P (Signed)
NAME:  [name], BUSHARD NO.:  [id number]   MEDICAL RECORD NO.:  [id number]          PATIENT TYPE:  INP   LOCATION:  1228                         FACILITY:  [facility]   PHYSICIAN:  Vania Rea, M.D. DATE OF BIRTH:  [birth year]   DATE OF ADMISSION:  08/26/2008  DATE OF DISCHARGE:                              HISTORY & PHYSICAL   PRIMARY CARE PHYSICIAN:  Dr. Alfonse Ras at [facility].   CHIEF COMPLAINT:  Fever, cough and shortness of breath for the past 3  days.   HISTORY OF PRESENT ILLNESS:  This is a 50 year old Caucasian lady with a  long history of tobacco abuse and COPD who for the past 2-3 days has  been feeling very unwell with headache, fever, cough and shortness of  breath and eventually came to the emergency room to be evaluated.  Was  found to be very ill-looking tachypneic saturating at 91% on room air,  and the hospitalist service called to assist with management.   The patient also complains of episodic vomiting when she tries to eat  about once per day.  Also complains of a marked weight loss and  decreased appetite over the past 3 days and headache and generalized  malaise.   The patient has a history of H1N1 infection as well as pneumonia about  one year ago.   PAST MEDICAL HISTORY:  1. Tobacco abuse.  2. COPD.  3. Bipolar disorder.  4. Depression.  5. Post-traumatic stress disorder.  6. Sinusitis.   MEDICATIONS:  1. Xyzal 5 mg daily.  2. Depakote extended release 500 mg twice daily.  3. Celexa 40 mg daily.  4. Vistaril 50 mg twice daily.  5. Advair discus 250/50 one puff twice daily.  6. Albuterol inhaler every 4 hours p.r.n. usually uses daily.  7. Atrovent inhaler every 4 hours p.r.n. usually uses this daily.  8. Clindamycin.   ALLERGIES:  CLINDAMYCIN, PENICILLIN, CODEINE AND TYLOX.  ALSO, ALLERGIC  TO ANOTHER MEDICATION THAT CAUSED THE STROKE-LIKE SYMPTOMS.  HOWEVER,  SHE CANNOT REMEMBER THE NAME OF IT.  SHE FEELS IT WAS FOR  SINUSITIS.   SOCIAL HISTORY:  She lives with her husband and son.  She smokes one and  a half packs per day.  Denies alcohol or illicit drug use in front of  her family.  However, her medical records indicate cannabis abuse.   FAMILY HISTORY:  Significant for diabetes.  She denies any family  history of lung problems or cancers.  She denies any sick contacts.   REVIEW OF SYSTEMS:  On a 10-point review of systems, other than noted  above, was unremarkable.   PHYSICAL EXAMINATION:  GENERAL:  An ill-looking middle-aged Caucasian  lady lying in the stretcher, looks dehydrated with sunken eyes.  VITALS:  Temperature is 101, pulses 109, respiration 20, blood pressure  is 80/45.  She is saturating at 92% on room air.  Pupils are round and  equal.  Mucous membranes pink.  Anicteric.  She is moderately  dehydrated.  No cervical lymphadenopathy or thyromegaly.  No jugular  venous distention.  No carotid bruit.  CHEST:  She has diffuse rhonchi and crackles bilaterally.  CARDIOVASCULAR:  Regular rhythm.  No murmur.  ABDOMEN:  Obese and soft.  She is tender in the right upper quadrant  with fullness in the right upper quadrant.  EXTREMITIES:  Without edema.  She has 2+ dorsalis pedis pulses  bilaterally.  CENTRAL NERVOUS SYSTEM: Cranial nerves II-XII are grossly intact, and  she has no focal neurologic deficit.   LABORATORY DATA:  White count is 10.9, hemoglobin 13.6, MCV 93.8,  platelets 156.  She has 91% neutrophils.  Absolute granulocyte count  elevated at 9.9.  No serum chemistry has been done as yet.  A two-view  chest x-ray shows bilateral upper lobe pneumonia, left greater than  right with underlying COPD.  No effusions.   ASSESSMENT:  1. Bilateral pneumonia.  2. Acute exacerbation of COPD.  3. Tobacco abuse.  4. Bipolar disorder.  5. Post-traumatic stress disorder.  6. Dehydration and hypotension.  7. Clinically enlarged liver.   PLAN:  We will the admit lady to the step-down  unit to give IV fluids  and check her serum chemistry.  Agree with treating her with Avelox.  Will add aztreonam and check her liver functions.  Will give serial  nebulizations, but will withhold steroids for the time being because of  infection.  We will give nicotine replacement patch.  Other plans as per  orders.      Vania Rea, M.D.  Electronically Signed     LC/MEDQ  D:  08/26/2008  T:  08/26/2008  Job:  [id number]   cc:   Sharin Grave, MD   [facility] Price [place], [place]

## 2010-07-15 NOTE — Discharge Summary (Signed)
[name], [name]                 ACCOUNT NO.:  [id number]   MEDICAL RECORD NO.:  [id number]          PATIENT TYPE:  INP   LOCATION:  1442                         FACILITY:  [facility]   PHYSICIAN:  Hillery Aldo, M.D.   DATE OF BIRTH:  October 26, 1960   DATE OF ADMISSION:  08/25/2008  DATE OF DISCHARGE:  08/29/2008                               DISCHARGE SUMMARY   PRIMARY CARE PHYSICIAN:  Dr. Alfonse Ras at [facility]   DISCHARGE DIAGNOSES:  1. Bilateral community-acquired pneumonia.  2. COPD with acute exacerbation.  3. Tobacco abuse.  4. Bipolar disorder.  5. Post traumatic stress disorder.  6. Steroid-induced hyperglycemia.  7. Chronic sinusitis post nasal drip.   DISCHARGE MEDICATIONS:  1. Xyzal 5 mg p.o. daily.  2. Depakote ER 500 mg p.o. b.i.d.  3. Celexa 40 mg p.o. daily.  4. Vistaril 50 mg p.o. b.i.d.  5. Advair discus 250/50 one inhalation b.i.d.  6. Albuterol inhaler 2 puffs q.4 h p.r.n. dyspnea.  7. Atrovent inhaler 2 puffs q.4 h p.r.n. dyspnea.  8. Aspirin 81 mg p.o. daily.  9. Reglan 10 mg p.o. q.6 h p.r.n. nausea.  10.Toradol 10 mg p.o. q.8 h p.r.n. headache.  11.Topiramate 100 mg p.o. q.h.s.  12.Tramadol 50 mg p.o. q.6 h p.r.n. pain.  13.Avelox 400 mg p.o. daily times 7 days.  14.Prednisone taper 60 mg to off over the next 6 days.   CONSULTATIONS:  None.   BRIEF ADMISSION HISTORY OF PRESENT ILLNESS:  The patient is a 50-year-  old female with past medical history of chronic sinusitis, chronic  obstructive pulmonary disease, and ongoing tobacco use who presented to  the hospital with a chief complaint of feeling unwell with headache,  fever, cough, and change in sputum production.  The patient was also  tachypneic with mild hypoxia and subsequently was referred to the  hospitalist service for further evaluation and inpatient treatment.  For  the full details, please see the dictated report done by Dr. Orvan Falconer.   PROCEDURES AND DIAGNOSTIC STUDIES.:  1. Chest x-ray  on August 25, 2008, showed bilateral pneumonia, left      greater than right.  2. Chest x-ray on August 28, 2008, showed worsening asymmetric air space      infiltrates, left worse than right.   DISCHARGE LABORATORY VALUES:  Sodium was 139, potassium four, chloride  112, bicarb 24, BUN 18, creatinine 0.55, glucose 123, calcium 8.5.  White blood cell count was 9.4, hemoglobin 11.9, hematocrit 36,  platelets 177.  Hemoglobin A1c was 5.6%.  Urine cultures and blood  cultures are negative to date.   HOSPITAL COURSE:  1. Bilateral community-acquired pneumonia.  The patient was put on      empiric aztreonam and Avelox and has completed 3 days of therapy.      We will continue her on these medications for an additional 7 days      for a total treatment course of 10 days.  The patient is advised to      follow up with her primary care physician at the end of her  treatment course to ensure that she is progressing well.  2. Chronic obstructive pulmonary disease with acute exacerbation:  The      patient was put on IV Solu-Medrol, empiric antibiotics, nebulized      bronchodilator therapy.  She is maintaining her oxygen saturation      in the low 90s on room air.  She is anxious for discharge, and      therefore, we will discharge her home with strict instructions to      return to the hospital if she develops any further dyspnea, or      change in her sputum production.  3. Tobacco abuse:  The patient was the patient was counseled on the      importance of cessation.  She was provided with a nicotine patch      while in the hospital.  4. Bipolar disorder/post traumatic stress disorder:  The patient was      continued on the usual psychotropic medications.  Her mood was      stable.  5. Hyperglycemia.  The patient's hyperglycemia is felt to be steroid      induced.  Hemoglobin A1c failed to show any evidence of underlying      diabetes.   DISPOSITION:  The patient is medically stable and can  be discharged  home.  She is encouraged to follow up with Dr. Weyman Pedro in 1-2 weeks.   Time spent coordinating care for discharge and discharge instructions  approximately equal to 35 minutes.      Hillery Aldo, M.D.  Electronically Signed     CR/MEDQ  D:  08/29/2008  T:  08/29/2008  Job:  [id number]   cc:   Sharin Grave, MD

## 2010-07-15 NOTE — Op Note (Signed)
NAME:  [name], [name]                 ACCOUNT NO.:  [id number]   MEDICAL RECORD NO.:  [id number]          PATIENT TYPE:  AMB   LOCATION:  CARD                         FACILITY:  [facility]   PHYSICIAN:  Barbaraann Share, MD,FCCPDATE OF BIRTH:  [birth year]   DATE OF PROCEDURE:  09/26/2008  DATE OF DISCHARGE:                               OPERATIVE REPORT   PROCEDURE:  Flexible fiberoptic bronchoscopy with bronchial brushes and  bronchoalveolar lavage.   INDICATIONS:  Bilateral pulmonary infiltrates of unknown origin that are  refractory to antibiotics.   ANESTHESIA:  Demerol 50 mg IV, Versed 10 mg IV in various aliquots, and  also topical 1% lidocaine to both cords and airways during the  procedure.   DESCRIPTION OF PROCEDURE:  After obtaining informed consent and under  close cardiopulmonary monitoring, the above preop anesthesia was given  and the fiberoptic scope was passed through the right nare and into the  posterior pharynx.  There were no obvious anatomical abnormalities  noted.  The vocal cords appeared to be within normal limits and moved  bilaterally on phonation.  The scope was then passed into the trachea  where it was examined along its entire length down to the level of the  carina; all of which was normal.  The left and right tracheobronchial  trees were examined serially with no obvious endobronchial abnormality  being found.  Surprisingly, there were very few secretions noted, and  only a small amount of creamy white congealed mucus in the apical  posterior segment of the left upper lobe.  A bronchoalveolar lavage was  done from the various segments of the left upper lobe and right upper  lobe and combined into one specimen.  Bronchial brushes were then done  from the apical posterior segment of the left upper lobe and sent for  cytology and also air-dried slots for AFB smears.  Finally, brushes were  also done from the anterior, posterior and apical segments of the right  upper lobe and sent for cytology only since there was some concern about  a spiculated mass in that area.  Overall, the patient tolerated the  procedure well and there were no complications.      Barbaraann Share, MD,FCCP  Electronically Signed     [facility]/MEDQ  D:  09/26/2008  T:  09/26/2008  Job:  [id number]   cc:   Barbaraann Share, MD,FCCP  [address]  [place]  [place] [address]

## 2010-07-16 ENCOUNTER — Other Ambulatory Visit (HOSPITAL_COMMUNITY): Payer: Self-pay | Admitting: Obstetrics and Gynecology

## 2010-07-17 ENCOUNTER — Ambulatory Visit (HOSPITAL_COMMUNITY)
Admission: RE | Admit: 2010-07-17 | Discharge: 2010-07-17 | Disposition: A | Discharge status: Home / Self Care | Payer: Medicaid Other | Source: Ambulatory Visit | Attending: *Deleted | Admitting: *Deleted

## 2010-08-07 ENCOUNTER — Telehealth: Payer: Self-pay | Admitting: Internal Medicine

## 2010-08-07 NOTE — Telephone Encounter (Signed)
Spoke with patient. She states she has been having a lot of problems with constipation and last week she had vomiting also. States she saw Dr. [name] at [facility]gent [facility] and was advised to f/u here. Patient states she is using Miralax, Dulcolax and Magnesium Citrate. States she had a bowel movement yesterday. Patient is crying stating she was told last week by a scheduler that she had to have a referral to get an appointment. Patient states she had a KUB with sitz markers and did not keep the appointment after this in April. She wants to get the results and know what to do now. Called Dr. Dell Ponto and obtained a referral for patient. Scheduled her to see Willette Cluster, NP on 08/08/10 at 1:30 PM.

## 2010-08-07 NOTE — Telephone Encounter (Signed)
Agree with her seeing Gunnar Fusi NP

## 2010-08-08 ENCOUNTER — Ambulatory Visit (INDEPENDENT_AMBULATORY_CARE_PROVIDER_SITE_OTHER): Payer: Medicaid Other | Admitting: Nurse Practitioner

## 2010-08-08 ENCOUNTER — Other Ambulatory Visit (INDEPENDENT_AMBULATORY_CARE_PROVIDER_SITE_OTHER): Payer: Medicaid Other

## 2010-08-08 ENCOUNTER — Encounter: Payer: Self-pay | Admitting: Nurse Practitioner

## 2010-08-08 DIAGNOSIS — K5900 Constipation, unspecified: Secondary | ICD-10-CM

## 2010-08-08 LAB — BASIC METABOLIC PANEL
CO2: 31 mEq/L (ref 19–32)
Chloride: 104 mEq/L (ref 96–112)
Potassium: 5.1 mEq/L (ref 3.5–5.1)
Sodium: 140 mEq/L (ref 135–145)

## 2010-08-08 MED ORDER — LUBIPROSTONE 8 MCG PO CAPS: 8.0000 ug | ORAL_CAPSULE | Freq: Two times a day (BID) | ORAL | Status: AC

## 2010-08-08 MED ORDER — NA SULFATE-K SULFATE-MG SULF 17.5-3.13-1.6 GM/177ML PO SOLN: ORAL | Status: DC

## 2010-08-08 MED ORDER — PROMETHAZINE HCL 25 MG PO TABS: 25.0000 mg | ORAL_TABLET | Freq: Four times a day (QID) | ORAL | Status: DC | PRN

## 2010-08-08 NOTE — Patient Instructions (Addendum)
Please go to the basement level to have your labs drawn.  We will call you with the lab results. Drink PLENTY OF FLUIDS!! [name] would like you to drink a bowel prep. We have given you a sample of SUPREP. Follow the directions on the box. We have given you samples of Amitiza, start after the bowel prep. Please call us after you have taken the bowel prep and give [place] a progress report.

## 2010-08-08 NOTE — Progress Notes (Signed)
[name] [id number] 10-04-1960   HISTORY OR PRESENT ILLNESS :  Ms. [name] is a 50 year old female followed by Dr. Juanda Chance for history of severe chronic constipation. In April patient had a sitz marker study with 16 rings remaining in the colon at the end of the study. Patient was supposed to follow up with Dr. Juanda Chance but because of insurance and scheduling issues she has not been able to come in. Despite Miralax, magnesium citrate, enemas, etc...patient still having tremendous times moving her bowels.  With Colonoscopy in September of 2011 revealed redundant, slightly dilated colon. She has frequent episodes of right mid abdominal pain which may be secondary to constipation. No nausea or vomiting.   Current Medications, Allergies, Past Medical History, Past Surgical History, Family History and Social History were reviewed in [employer] record.   PHYSICAL EXAMINATION : General: Well developed  female in no acute distress Head: Normocephalic and atraumatic Eyes:  sclerae anicteric,conjunctive pink. Ears: Normal auditory acuity Mouth: No deformity or lesions. Tongue somewhat dry Neck: Supple, no masses.  Lungs: Clear throughout to auscultation Heart: Regular rate and rhythm; no murmurs heard Abdomen: Soft, nontender, mildly distended. No masses or hepatomegaly noted. Normal bowel sounds Rectal: No impaction, no stool in vault.  Musculoskeletal: Symmetrical with no gross deformities  Skin: No lesions on visible extremities Extremities: No edema or deformities noted Neurological: Alert oriented x 4, grossly nonfocal Cervical Nodes:  No significant cervical adenopathy Psychological:  Alert and cooperative. Normal mood and affect  ASSESSMENT AND PLAN :

## 2010-08-11 ENCOUNTER — Telehealth: Payer: Self-pay | Admitting: Nurse Practitioner

## 2010-08-11 ENCOUNTER — Other Ambulatory Visit: Payer: Self-pay | Admitting: Pulmonary Disease

## 2010-08-11 ENCOUNTER — Telehealth: Payer: Self-pay | Admitting: Pulmonary Disease

## 2010-08-11 MED ORDER — FLUTICASONE-SALMETEROL 250-50 MCG/DOSE IN AEPB: 1.0000 | INHALATION_SPRAY | Freq: Two times a day (BID) | RESPIRATORY_TRACT | Status: DC

## 2010-08-11 NOTE — Telephone Encounter (Signed)
Called and spoke with pt.  Pt is requesting a refill on her Advair 250/50.  Informed pt rx sent to pharmacy.  Also, pt wanted to see if she can get rx for Chantix.  States she is currently on the nicotine patches but "it is making it harder for pt to quit smoking."  Pt states KC had recommended Chantix in the past but didn't want to prescribe to pt d/t her psych meds.  Pt states she is currently off Depakote x 1 week because she will be started a new med (Lithium) on 6/26.  Therefore wanted to try Chantix while not on any psych meds.  KC out of office this week.  Will forward message to doc of the day to address.

## 2010-08-11 NOTE — Telephone Encounter (Signed)
Ok to refill advair. REgarding chantix: given [facility] psych hx, not being my patient, she needs to discuss with [facility] about it upon return as telephone note or return OV

## 2010-08-11 NOTE — Telephone Encounter (Signed)
Spoke with pt and notified of recs per MR. She verbalized understanding. She prefers this be handled through msg rather than ov. KC, pls advise thanks

## 2010-08-11 NOTE — Telephone Encounter (Signed)
Called the pt back and she did do the bowel purge with Suprep on Sat.  She did the split dose with several hours in between the doses. She said she did get rid of a lot of stool.   She said that evening and Sunday 6-10, she felt better and the pain on her side was much better.  She started taking the Amitiza on Sunday 08-10-10.

## 2010-08-12 ENCOUNTER — Telehealth: Payer: Self-pay | Admitting: Nurse Practitioner

## 2010-08-12 ENCOUNTER — Telehealth: Payer: Self-pay | Admitting: Internal Medicine

## 2010-08-12 NOTE — Telephone Encounter (Signed)
Spoke with patient and told her the Cmet results were normal.

## 2010-08-12 NOTE — Telephone Encounter (Signed)
Left a message for patient to call me. 

## 2010-08-13 NOTE — Progress Notes (Signed)
i agree with the plan outlined in this note 

## 2010-08-13 NOTE — Assessment & Plan Note (Addendum)
Severe chronic constipation / colonic inertia. Colonoscopy Sept. 2011 revealed redundant slightly dilated colon. Sitz marker study in April abnormal with 16 rings remaining at end of study. Patient is taking numerous agents including Miralax, Mag citrate, Dulcolax, enemas, etc..Marland KitchenWill purge bowels with a colonoscopy prep then give her a trial of Amitiza 8mg  BID which can be increased to 24mg  BID in a couple of weeks if need be. Encouraged adequate fluid intake. Her heartrate is 100, tongue slightly dry, ? Slightly dehydrated. Will check basic metabolic profile today .

## 2010-08-13 NOTE — Telephone Encounter (Signed)
No medication indicated in this refill request. Will sign off.

## 2010-08-14 ENCOUNTER — Telehealth: Payer: Self-pay | Admitting: Internal Medicine

## 2010-08-14 MED ORDER — NA SULFATE-K SULFATE-MG SULF 17.5-3.13-1.6 GM/177ML PO SOLN: ORAL | Status: DC

## 2010-08-14 NOTE — Telephone Encounter (Signed)
Patient advised.

## 2010-08-14 NOTE — Telephone Encounter (Signed)
Patient was started on Amitiza 24 last week by Willette Cluster RNP .  The patient did a bowel purge on Saturday with Suprep. The patient states she was instructed to stop her other laxatives while on Amitiza.   She had a BM over the weekend , but not had one since.  She states she is in pain, her back and side hurts.  Dr Juanda Chance she is asking for another bowel purge.  Dr Juanda Chance please advise

## 2010-08-14 NOTE — Telephone Encounter (Signed)
Please prescribe Suprep 1 bottle 2x/week, disp #10, 2 refill, may skip a dose if diarrhea occurs. May continue other laxatives. Would check electrolytes(, b-met) monthly

## 2010-08-18 NOTE — Telephone Encounter (Signed)
I informed pt of KC's recommendations. Pt verbalized understanding

## 2010-08-18 NOTE — Telephone Encounter (Signed)
I would prefer she not be on this medication if possible.  I am concerned that she will need to be on chantix for awhile, and there may be interactions with a lot of the meds she may need to take.

## 2010-08-18 NOTE — Telephone Encounter (Signed)
[id number].[name], CMA  

## 2010-09-25 ENCOUNTER — Telehealth: Payer: Self-pay | Admitting: *Deleted

## 2010-09-25 ENCOUNTER — Other Ambulatory Visit (INDEPENDENT_AMBULATORY_CARE_PROVIDER_SITE_OTHER): Payer: Medicaid Other

## 2010-09-25 DIAGNOSIS — K5900 Constipation, unspecified: Secondary | ICD-10-CM

## 2010-09-25 LAB — BASIC METABOLIC PANEL
Chloride: 95 mEq/L — ABNORMAL LOW (ref 96–112)
GFR: 106.09 mL/min (ref >60.00)
Glucose, Bld: 90 mg/dL (ref 70–99)
Potassium: 4 mEq/L (ref 3.5–5.1)
Sodium: 132 mEq/L — ABNORMAL LOW (ref 135–145)

## 2010-09-25 NOTE — Telephone Encounter (Signed)
Patient is in lab stating she needs to have monthly labs. Per Dr. Regino Schultze note patient is to have Bmet monthly. Labs in computer.

## 2010-09-26 ENCOUNTER — Telehealth: Payer: Self-pay | Admitting: Internal Medicine

## 2010-09-26 NOTE — Telephone Encounter (Signed)
Patient calling for lab results and had many questions about long term use of laxatives and the effects on her body. She wants to know if there is anything that can be done for her condition other than laxatives. Please, advise.

## 2010-09-27 ENCOUNTER — Other Ambulatory Visit: Payer: Self-pay | Admitting: Pulmonary Disease

## 2010-09-28 NOTE — Telephone Encounter (Signed)
Please call pt with normal K+, the Na+ is slightly low due to laxatives. Please dring lot of liquids. For now she has to take laxatives frequently enough to have at least 2-3 BM's per week. Use Probiotics,too.  Lot of fiber. Prune juice.

## 2010-09-29 ENCOUNTER — Telehealth: Payer: Self-pay

## 2010-09-29 NOTE — Telephone Encounter (Signed)
Pt aware.

## 2010-09-29 NOTE — Telephone Encounter (Signed)
Message copied by Michele Mcalpine on Mon Sep 29, 2010  2:57 PM ------      Message from: [place], [place] M      Created: Sun Sep 28, 2010 10:14 AM       Please call pt with K+ being back to normal now.

## 2010-09-30 ENCOUNTER — Other Ambulatory Visit: Payer: Self-pay | Admitting: Family Medicine

## 2010-09-30 DIAGNOSIS — M419 Scoliosis, unspecified: Secondary | ICD-10-CM

## 2010-09-30 DIAGNOSIS — N6320 Unspecified lump in the left breast, unspecified quadrant: Secondary | ICD-10-CM

## 2010-10-08 ENCOUNTER — Ambulatory Visit
Admission: RE | Admit: 2010-10-08 | Discharge: 2010-10-08 | Disposition: A | Discharge status: Home / Self Care | Payer: Medicaid Other | Source: Ambulatory Visit | Attending: Family Medicine | Admitting: Family Medicine

## 2010-10-08 ENCOUNTER — Other Ambulatory Visit: Payer: Medicaid Other

## 2010-10-08 ENCOUNTER — Other Ambulatory Visit: Payer: Self-pay | Admitting: Family Medicine

## 2010-10-08 DIAGNOSIS — R0781 Pleurodynia: Secondary | ICD-10-CM

## 2010-11-20 LAB — POCT CARDIAC MARKERS
CKMB, poc: <1 — ABNORMAL LOW
Myoglobin, poc: 52.8
Operator id: 161631
Troponin i, poc: <0.05

## 2010-11-20 LAB — I-STAT 8, (EC8 V) (CONVERTED LAB)
Bicarbonate: 26.9 — ABNORMAL HIGH
Glucose, Bld: 101 — ABNORMAL HIGH
Sodium: 138
TCO2: 28
pCO2, Ven: 50.2 — ABNORMAL HIGH
pH, Ven: 7.337 — ABNORMAL HIGH

## 2010-11-20 LAB — D-DIMER, QUANTITATIVE

## 2010-11-20 LAB — PROTIME-INR: INR: 1

## 2010-11-28 LAB — POCT RAPID STREP A: Streptococcus, Group A Screen (Direct): NEGATIVE

## 2010-12-01 ENCOUNTER — Other Ambulatory Visit: Payer: Self-pay | Admitting: Pulmonary Disease

## 2010-12-01 LAB — POCT URINALYSIS DIP (DEVICE)
Glucose, UA: NEGATIVE
Nitrite: NEGATIVE
Operator id: 29721
Urobilinogen, UA: 0.2

## 2010-12-01 LAB — POCT PREGNANCY, URINE: Preg Test, Ur: NEGATIVE

## 2011-01-02 ENCOUNTER — Ambulatory Visit: Payer: Medicaid Other | Admitting: Pulmonary Disease

## 2011-01-05 NOTE — Telephone Encounter (Signed)
done

## 2011-01-18 ENCOUNTER — Other Ambulatory Visit: Payer: Self-pay | Admitting: Pulmonary Disease

## 2011-01-30 ENCOUNTER — Ambulatory Visit: Payer: Self-pay | Admitting: Pulmonary Disease

## 2011-02-05 ENCOUNTER — Ambulatory Visit: Payer: Self-pay | Admitting: Pulmonary Disease

## 2011-02-09 ENCOUNTER — Ambulatory Visit (INDEPENDENT_AMBULATORY_CARE_PROVIDER_SITE_OTHER): Payer: Medicaid Other | Admitting: Pulmonary Disease

## 2011-02-09 ENCOUNTER — Encounter: Payer: Self-pay | Admitting: Pulmonary Disease

## 2011-02-09 ENCOUNTER — Ambulatory Visit (INDEPENDENT_AMBULATORY_CARE_PROVIDER_SITE_OTHER)
Admission: RE | Admit: 2011-02-09 | Discharge: 2011-02-09 | Disposition: A | Discharge status: Home / Self Care | Payer: Medicaid Other | Source: Ambulatory Visit | Attending: Pulmonary Disease | Admitting: Pulmonary Disease

## 2011-02-09 VITALS — BP 102/70 | HR 108 | Temp 98.0°F | Ht 67.5 in | Wt 136.0 lb

## 2011-02-09 DIAGNOSIS — J4489 Other specified chronic obstructive pulmonary disease: Secondary | ICD-10-CM

## 2011-02-09 DIAGNOSIS — J449 Chronic obstructive pulmonary disease, unspecified: Secondary | ICD-10-CM

## 2011-02-09 NOTE — Assessment & Plan Note (Signed)
The patient is fairly stable from a COPD standpoint.  She denies a recent acute exacerbation, and feels that her dyspnea on exertion is at her usual baseline.  I have asked her to continue on her bronchodilator regimen, and that smoking cessation is the best treatment currently for her lung disease.  I have recommended that she not take daliresp at this time.

## 2011-02-09 NOTE — Patient Instructions (Signed)
Stay on spiriva and advair Would stop daliresp since you have not recently been having recurrent flareups, and the number one cause of flareups is smoking.  Do the best you can to quit.  Will check cxr today, and call you with results.  followup with me in 6mos.

## 2011-02-09 NOTE — Progress Notes (Signed)
  Subjective:    Patient ID: [name], female    DOB: 02/18/1961, 50 y.o.   MRN: [id number]  HPI Patient comes in today for followup of her known COPD.  She's been staying on her bronchodilator regimen, but unfortunately continues to smoke.  She has not been having recurrent exacerbations since her last visit.  She was started on daliresp, but has not been taking the medication.  I have told her a better treatment will be total smoking cessation.  Patient states that her dyspnea on exertion is at her usual baseline.   Review of Systems  Constitutional: Positive for chills. Negative for fever and unexpected weight change.  HENT: Positive for trouble swallowing. Negative for ear pain, nosebleeds, congestion, sore throat, rhinorrhea, sneezing, dental problem, postnasal drip and sinus pressure.   Eyes: Negative for redness and itching.  Respiratory: Positive for cough, chest tightness and shortness of breath. Negative for wheezing.   Cardiovascular: Negative for palpitations and leg swelling.  Gastrointestinal: Negative for nausea and vomiting.  Genitourinary: Negative for dysuria.  Musculoskeletal: Negative for joint swelling.  Skin: Negative for rash.  Neurological: Positive for headaches.  Hematological: Does not bruise/bleed easily.  Psychiatric/Behavioral: Negative for dysphoric mood. The patient is not nervous/anxious.        Objective:   Physical Exam Thin female in no acute distress Nose without purulence or discharge noted Chest with mild decrease in breath sounds, no wheezes or rhonchi Cardiac exam with mild tachycardia, but regular Lower extremities without edema, no cyanosis noted Alert and oriented, moves all 4 extremities.       Assessment & Plan:

## 2011-02-18 ENCOUNTER — Other Ambulatory Visit: Payer: Self-pay | Admitting: Pulmonary Disease

## 2011-03-13 ENCOUNTER — Telehealth: Payer: Self-pay | Admitting: Pulmonary Disease

## 2011-03-13 NOTE — Telephone Encounter (Signed)
I spoke with pt and she states she wanted to know what type of flu shot she got from Dr. Fredirick Maudlin office. I advised her we do not have that on file she will have to call their office and see. She voiced her understanding and had no questions

## 2011-03-25 ENCOUNTER — Telehealth: Payer: Self-pay | Admitting: Pulmonary Disease

## 2011-03-25 NOTE — Telephone Encounter (Signed)
Spoke with pt. She states was hospitalized while visiting her mother in [place]- had PNA and was started on o2. She states has OV with [facility] scheduled for this Friday 03/27/11 and wants to be sure that [facility] has received records from her hospital stay. She states that these were to have been faxed here already.  Checked the file up front and did not see any papers on her and per [facility] she has not received anything.  LMOM for pt (per her request) informing her that we have not received her records and may want to call the hospital she was at and have them refax to [place]. I left our [phone] fax number for her and advised to call back only if has further questions.

## 2011-03-27 ENCOUNTER — Ambulatory Visit: Payer: Medicaid Other | Admitting: Pulmonary Disease

## 2011-06-11 ENCOUNTER — Other Ambulatory Visit: Payer: Self-pay | Admitting: Pulmonary Disease

## 2011-08-09 ENCOUNTER — Other Ambulatory Visit: Payer: Self-pay | Admitting: Pulmonary Disease

## 2011-08-12 ENCOUNTER — Ambulatory Visit: Payer: Medicaid Other | Admitting: Pulmonary Disease

## 2011-08-14 ENCOUNTER — Telehealth: Payer: Self-pay | Admitting: Pulmonary Disease

## 2011-08-14 MED ORDER — FLUTICASONE-SALMETEROL 250-50 MCG/DOSE IN AEPB: 1.0000 | INHALATION_SPRAY | Freq: Two times a day (BID) | RESPIRATORY_TRACT | Status: DC

## 2011-08-14 NOTE — Telephone Encounter (Signed)
Spoke with pt made aware that she needs to make an appt  To see Dr Shelle Iron. Pt  Verbally understood transferred her to  Up front to make appt.

## 2011-09-08 ENCOUNTER — Other Ambulatory Visit: Payer: Self-pay | Admitting: Family Medicine

## 2011-09-08 ENCOUNTER — Ambulatory Visit
Admission: RE | Admit: 2011-09-08 | Discharge: 2011-09-08 | Disposition: A | Discharge status: Home / Self Care | Payer: Medicaid Other | Source: Ambulatory Visit | Attending: Family Medicine | Admitting: Family Medicine

## 2011-09-08 DIAGNOSIS — R52 Pain, unspecified: Secondary | ICD-10-CM

## 2011-09-29 ENCOUNTER — Emergency Department (HOSPITAL_COMMUNITY)
Admission: EM | Admit: 2011-09-29 | Discharge: 2011-09-30 | Disposition: A | Discharge status: Home / Self Care | Payer: Medicaid Other | Attending: Emergency Medicine | Admitting: Emergency Medicine

## 2011-09-29 ENCOUNTER — Encounter (HOSPITAL_COMMUNITY): Payer: Self-pay

## 2011-09-29 DIAGNOSIS — R109 Unspecified abdominal pain: Secondary | ICD-10-CM

## 2011-09-29 DIAGNOSIS — F17200 Nicotine dependence, unspecified, uncomplicated: Secondary | ICD-10-CM | POA: Insufficient documentation

## 2011-09-29 DIAGNOSIS — J438 Other emphysema: Secondary | ICD-10-CM | POA: Insufficient documentation

## 2011-09-29 DIAGNOSIS — R1011 Right upper quadrant pain: Secondary | ICD-10-CM | POA: Insufficient documentation

## 2011-09-29 LAB — CBC
MCH: 30.5 pg (ref 26.0–34.0)
Platelets: 240 10*3/uL (ref 150–400)
RBC: 4.78 MIL/uL (ref 3.87–5.11)
RDW: 13.6 % (ref 11.5–15.5)
WBC: 11.5 10*3/uL — ABNORMAL HIGH (ref 4.0–10.5)

## 2011-09-29 LAB — COMPREHENSIVE METABOLIC PANEL
ALT: 12 U/L (ref 0–35)
AST: 17 U/L (ref 0–37)
Albumin: 3.8 g/dL (ref 3.5–5.2)
CO2: 29 mEq/L (ref 19–32)
Calcium: 9.1 mg/dL (ref 8.4–10.5)
Chloride: 98 mEq/L (ref 96–112)
GFR calc non Af Amer: >90 mL/min (ref >90)
Sodium: 135 mEq/L (ref 135–145)

## 2011-09-29 NOTE — ED Notes (Signed)
Went to PMD today for RUQ pain that she's been having x last year on and off.  Has had issues w/pneumonia and constipation on and off as well.  Her PMD sent her b/c of her liver being enlarged.  Pt is having increased shob.  Wears 2L/O2 aat.

## 2011-09-30 ENCOUNTER — Emergency Department (HOSPITAL_COMMUNITY): Payer: Medicaid Other

## 2011-09-30 LAB — URINALYSIS, ROUTINE W REFLEX MICROSCOPIC
Glucose, UA: NEGATIVE mg/dL
Hgb urine dipstick: NEGATIVE
Leukocytes, UA: NEGATIVE
Protein, ur: NEGATIVE mg/dL
pH: 7 (ref 5.0–8.0)

## 2011-09-30 MED ORDER — FENTANYL CITRATE 0.05 MG/ML IJ SOLN
50.0000 ug | Freq: Once | INTRAMUSCULAR | Status: AC
  Administered 2011-09-30: 02:00:00 via INTRAVENOUS
  Filled 2011-09-30: qty 2

## 2011-09-30 MED ORDER — IOHEXOL 300 MG/ML  SOLN
100.0000 mL | Freq: Once | INTRAMUSCULAR | Status: AC | PRN
  Administered 2011-09-30: 100 mL via INTRAVENOUS

## 2011-09-30 MED ORDER — SODIUM CHLORIDE 0.9 % IV BOLUS (SEPSIS)
1000.0000 mL | Freq: Once | INTRAVENOUS | Status: AC
  Administered 2011-09-30: 1000 mL via INTRAVENOUS

## 2011-09-30 MED ORDER — HYDROMORPHONE HCL PF 1 MG/ML IJ SOLN
1.0000 mg | Freq: Once | INTRAMUSCULAR | Status: AC
  Administered 2011-09-30: 1 mg via INTRAVENOUS
  Filled 2011-09-30: qty 1

## 2011-09-30 NOTE — ED Notes (Signed)
Pt has been told 3 times to call for assistance when getting out of bed after each pain medication administration. Pt is seen walking to and from bathroom hold IV pole and advised not to do so.

## 2011-09-30 NOTE — ED Provider Notes (Signed)
History     CSN: [id number]  Arrival date & time 09/29/11  2207   First MD Initiated Contact with Patient 09/29/11 2356      Chief Complaint  Patient presents with  . Abdominal Pain     The history is provided by the patient.   patient reports pain in her right side of her abdomen the right flank for approximately 9-10 months.  She reports the pain is always there and at times gets worse.  She denies nausea and vomiting.  She has no diarrhea.  She's been seen multiple times by her primary care physician for this without imaging and no clear diagnosis has been made.  Should she went to her primary care doctor today for right-sided abdominal pain he was concerned about possible hepatomegaly and it sounds as though the patient was sent to the emergency department for evaluation.  The patient has a history of pneumonia and is now oxygen dependent as a result of this and her underlying emphysema.  She reports her breathing has been shorter the past several months.  She denies chest pain fevers or chills.  She's had no productive cough.   Past Medical History  Diagnosis Date  . Chronic headache   . TIA (transient ischemic attack)   . PTSD (post-traumatic stress disorder)   . Bipolar disorder   . COPD (chronic obstructive pulmonary disease)   . GERD (gastroesophageal reflux disease)   . Hepatomegaly     hx  . Colonic inertia   . Iron deficiency anemia   . Lung abscess     Past Surgical History  Procedure Date  . Vein surgery     transplant  . Cesarean section     x 2    Family History  Problem Relation Age of Onset  . Emphysema Father   . Asthma Son   . Clotting disorder Mother   . Rheum arthritis Sister   . Rheum arthritis Brother   . Ovarian cancer Sister     History  Substance Use Topics  . Smoking status: Current Everyday Smoker -- 1.0 packs/day for 39 years  . Smokeless tobacco: Not on file  . Alcohol Use: No    OB History    Grav Para Term Preterm Abortions  TAB SAB Ect Mult Living                  Review of Systems  All other systems reviewed and are negative.    Allergies  Codeine; Hydrocodone; and Oxycodone-acetaminophen  Home Medications   Current Outpatient Rx  Name Route Sig Dispense Refill  . ALBUTEROL SULFATE (2.5 MG/3ML) 0.083% IN NEBU Nebulization Take 2.5 mg by nebulization every 6 (six) hours as needed. For shortness of breath    . ALBUTEROL SULFATE HFA 108 (90 BASE) MCG/ACT IN AERS Inhalation Inhale 2 puffs into the lungs every 6 (six) hours as needed.      Marland Kitchen BISACODYL 5 MG PO TBEC Oral Take 5 mg by mouth 2 (two) times daily.      Marland Kitchen DILTIAZEM HCL ER 240 MG PO CP24 Oral Take 240 mg by mouth daily.      Marland Kitchen FLUTICASONE-SALMETEROL 250-50 MCG/DOSE IN AEPB Inhalation Inhale 1 puff into the lungs 2 (two) times daily. 60 each 0  . MOMETASONE FUROATE 50 MCG/ACT NA SUSP Nasal Place 2 sprays into the nose 2 (two) times daily.     . OXYCODONE HCL ER 10 MG PO TB12 Oral Take 10 mg by mouth  every 12 (twelve) hours as needed. For pain    . POLYETHYLENE GLYCOL 3350 PO POWD Oral Take 17 g by mouth daily.     Marland Kitchen PROMETHAZINE HCL 25 MG PO TABS Oral Take 25 mg by mouth 4 (four) times daily as needed. For nausea    . QUETIAPINE FUMARATE 100 MG PO TABS Oral Take 100 mg by mouth at bedtime.      Marland Kitchen SPIRIVA HANDIHALER 18 MCG IN CAPS  inhale contents of 1 capsule by mouth once daily 30 capsule 6  . PROMETHAZINE HCL 25 MG PO TABS Oral Take 1 tablet (25 mg total) by mouth every 6 (six) hours as needed for nausea. 20 tablet 0    BP 118/75  Pulse 95  Temp 97.1 F (36.2 C) (Oral)  Resp 20  SpO2 100%  Physical Exam  Nursing note and vitals reviewed. Constitutional: She is oriented to person, place, and time. She appears well-developed and well-nourished. No distress.  HENT:  Head: Normocephalic and atraumatic.  Eyes: EOM are normal.  Neck: Normal range of motion.  Cardiovascular: Normal rate, regular rhythm and normal heart sounds.     Pulmonary/Chest: Effort normal and breath sounds normal.  Abdominal: Soft. She exhibits no distension. There is no tenderness.  Musculoskeletal: Normal range of motion.  Neurological: She is alert and oriented to person, place, and time.  Skin: Skin is warm and dry.  Psychiatric: She has a normal mood and affect. Judgment normal.    ED Course  Procedures (including critical care time)  Labs Reviewed  CBC - Abnormal; Notable for the following:    WBC 11.5 (*)     All other components within normal limits  COMPREHENSIVE METABOLIC PANEL - Abnormal; Notable for the following:    Glucose, Bld 126 (*)     All other components within normal limits  LIPASE, BLOOD  URINALYSIS, ROUTINE W REFLEX MICROSCOPIC   Ct Abdomen Pelvis Wo Contrast  09/30/2011  *RADIOLOGY REPORT*  Clinical Data: Right upper quadrant abdominal pain.  CT ABDOMEN AND PELVIS WITHOUT CONTRAST  Technique:  Multidetector CT imaging of the abdomen and pelvis was performed following the standard protocol without intravenous contrast.  Comparison: 10/30/2008 PET-CT  Findings: Hyperinflated lung bases.  Normal heart size.  No pleural or pericardial effusion.  Organ abnormality/lesion detection is limited in the absence of intravenous contrast. Within this limitation, unremarkable liver, biliary system, pancreas, and adrenal glands.  Punctate calcifications throughout the spleen, in keeping with sequelae of prior granulomas infection.  Symmetric renal size.  No hydronephrosis or hydroureter.  No urinary tract calculi identified.  No bowel obstruction.  No CT evidence for colitis.  Normal appendix.  No free intraperitoneal air or fluid.  No lymphadenopathy.  There is scattered atherosclerotic calcification of the aorta and its branches. No aneurysmal dilatation.  Mild nonspecific bladder wall thickening.  The bladder is distended.  Noncontrast appearance of the uterus is within normal limits.  No adnexal mass.  Multilevel facet arthropathy.   No acute osseous finding.  IMPRESSION: Mild bladder wall thickening is nonspecific.  Correlate with urinalysis if concerned for cystitis.  Otherwise, within limitations of a noncontrast examination, no acute abnormality identified.  Original Report Authenticated By: Waneta Martins, M.D.   Dg Chest 2 View  09/30/2011  *RADIOLOGY REPORT*  Clinical Data: Emphysema  CHEST - 2 VIEW  Comparison: 02/09/2011.  Findings: The cardiac silhouette, mediastinal and hilar contours are within normal limits and stable.  There are stable emphysematous changes and a  stable calcified left upper lobe granuloma. Left upper lobe scarring changes are stable.  No infiltrates, edema or effusions.  IMPRESSION:  1.  Stable emphysematous changes and pulmonary scarring. 2.  No acute overlying pulmonary process.  Original Report Authenticated By: P. Loralie Champagne, M.D.    I personally reviewed the imaging tests through PACS system  I reviewed available ER/hospitalization records thought the EMR   1. Abdominal pain       MDM  The patient's pain is improved in the emergency department.  Her labs and CT scan are without significant pathology.  The patient will continue to followup with her primary care physician regarding her ongoing pain in her right flank and right lower abdomen        Lyanne Co, MD 09/30/11 [phone]

## 2011-10-09 ENCOUNTER — Ambulatory Visit (INDEPENDENT_AMBULATORY_CARE_PROVIDER_SITE_OTHER): Payer: Medicaid Other | Admitting: Pulmonary Disease

## 2011-10-09 ENCOUNTER — Encounter: Payer: Self-pay | Admitting: Pulmonary Disease

## 2011-10-09 VITALS — BP 120/76 | HR 80 | Temp 98.2°F | Ht 67.5 in | Wt 137.0 lb

## 2011-10-09 DIAGNOSIS — J449 Chronic obstructive pulmonary disease, unspecified: Secondary | ICD-10-CM

## 2011-10-09 NOTE — Patient Instructions (Addendum)
Continue on current inhalers. Stop smoking.  You know this is the key to you getting better. You need to stay on oxygen while sleeping and with exertion.  You do not need while sitting and awake.

## 2011-10-09 NOTE — Progress Notes (Signed)
  Subjective:    Patient ID: [name], female    DOB: 10-02-1960, 51 y.o.   MRN: [id number]  HPI The patient comes in today for followup of her known emphysema.  She was visiting family in [place] in January when she required admission to the hospital for pneumonia.  She improved with antibiotics, but required oxygen at discharge.  Since that time, she has stayed on her oxygen intermittently, as well as her bronchodilators.  Unfortunately, she continues to smoke.  She has a mild cough with no purulent mucus, and her exertional tolerance is at her usual baseline.   Review of Systems  Constitutional: Negative for fever and unexpected weight change.  HENT: Negative for ear pain, nosebleeds, congestion, sore throat, rhinorrhea, sneezing, trouble swallowing, dental problem, postnasal drip and sinus pressure.   Eyes: Negative for redness and itching.  Respiratory: Positive for cough and shortness of breath. Negative for chest tightness and wheezing.   Cardiovascular: Negative for palpitations and leg swelling.  Gastrointestinal: Negative for nausea and vomiting.  Genitourinary: Positive for difficulty urinating. Negative for dysuria.  Musculoskeletal: Negative for joint swelling.  Skin: Negative for rash.  Neurological: Negative for headaches.  Hematological: Does not bruise/bleed easily.  Psychiatric/Behavioral: Negative for dysphoric mood. The patient is not nervous/anxious.   All other systems reviewed and are negative.       Objective:   Physical Exam Thin female in no acute distress Nose with purulent discharge noted Chest with decreased breath sounds throughout, no wheezes or rhonchi Cardiac exam with regular rate and rhythm Lower extremities without edema, no cyanosis Alert and oriented, moves all 4 extremities.       Assessment & Plan:

## 2011-10-09 NOTE — Assessment & Plan Note (Signed)
The patient appears to be fairly stable from a pulmonary standpoint currently, but unfortunately continues to smoke.  I have told her that she will have progressive disease, and also recurrent pulmonary infections if she continues to do so.  She is on an excellent bronchodilator regimen, and unfortunately still requires oxygen with exertion.  I suspect she will not be able to come off oxygen until she totally quits, and also participates in pulmonary rehabilitation.

## 2011-10-12 ENCOUNTER — Telehealth: Payer: Self-pay | Admitting: Pulmonary Disease

## 2011-10-12 NOTE — Telephone Encounter (Signed)
Called spoke with patient who stated that she has been having urinary symptoms for 1.5-2 yrs, approx as long as she has been on the spiriva.  C/o difficulty starting urinating, she occasionally has to strain and push to urinate, does not feel as though she empties her bladder, occasional urgency and finds herself holding her urine until her bladder is fuller and this makes it easier.  Denies burning, odor or darker clears.  Remarks that she drinks plenty of water and her urine is generally light- to clear in color.  Notes symptoms have worsened in the last 9 months.  Wonders if symptoms may be related to her spiriva.   Dr Shelle Iron please advise, thanks.  *NOTE: pt did mention she was okay with a call back tomorrow d/t the late hour.

## 2011-10-12 NOTE — Telephone Encounter (Signed)
Then stop the spiriva and see if things get better.

## 2011-10-13 ENCOUNTER — Ambulatory Visit: Payer: Medicaid Other | Admitting: Pulmonary Disease

## 2011-10-13 NOTE — Telephone Encounter (Signed)
lmomtcb x1 

## 2011-10-13 NOTE — Telephone Encounter (Signed)
I spoke with pt and is aware of [facility] recs. She voiced her understanding and had no questions

## 2011-10-15 ENCOUNTER — Other Ambulatory Visit (HOSPITAL_COMMUNITY): Payer: Self-pay | Admitting: Family Medicine

## 2011-10-15 DIAGNOSIS — R1011 Right upper quadrant pain: Secondary | ICD-10-CM

## 2011-10-15 DIAGNOSIS — R1110 Vomiting, unspecified: Secondary | ICD-10-CM

## 2011-10-15 DIAGNOSIS — R634 Abnormal weight loss: Secondary | ICD-10-CM

## 2011-10-18 ENCOUNTER — Other Ambulatory Visit: Payer: Self-pay | Admitting: Pulmonary Disease

## 2011-10-21 ENCOUNTER — Telehealth: Payer: Self-pay | Admitting: Internal Medicine

## 2011-10-21 ENCOUNTER — Ambulatory Visit (HOSPITAL_COMMUNITY)
Admission: RE | Admit: 2011-10-21 | Discharge: 2011-10-21 | Disposition: A | Discharge status: Home / Self Care | Payer: Medicaid Other | Source: Ambulatory Visit | Attending: Family Medicine | Admitting: Family Medicine

## 2011-10-21 DIAGNOSIS — R1110 Vomiting, unspecified: Secondary | ICD-10-CM | POA: Insufficient documentation

## 2011-10-21 DIAGNOSIS — R634 Abnormal weight loss: Secondary | ICD-10-CM

## 2011-10-21 DIAGNOSIS — R1011 Right upper quadrant pain: Secondary | ICD-10-CM

## 2011-10-21 MED ORDER — TECHNETIUM TC 99M MEBROFENIN IV KIT
5.0000 | PACK | Freq: Once | INTRAVENOUS | Status: AC | PRN
  Administered 2011-10-21: 5 via INTRAVENOUS

## 2011-10-21 NOTE — Telephone Encounter (Signed)
Patient calling to report nausea and chronic constipation. States she is taking Miralax, Dulcolax and enemas but not having good bowel movements. She states she has nausea and is only taking in liquids. Patient states her PCP ordered HIDA scan and Ultrasound abdomen recently. States Dr. Clarene Duke her PCP has not given her anything for constipation. Patient states she did not continue the Suprep purges or montlhy labs as suggested 08/14/2010. She is interested in discussing if this is the way she will always have to take medications to have bowel movements. Scheduled patient with Willette Cluster, NP on 10/27/11 at 2:30 PM.

## 2011-10-21 NOTE — Telephone Encounter (Signed)
She has a hx of colonic hypomotility/redundant colon on last colon 2011, positive Sitz marks. She has to be on something strong. She has tried alredy  Amitiza, Mag citrate, Miralax. Do you want her  to try Lactulose 45 cc po qam,16 oz,  Dulcolox tab 2 po qhs. before she sees [facility]?

## 2011-10-22 MED ORDER — LACTULOSE 10 GM/15ML PO SOLN: ORAL | Status: DC

## 2011-10-22 NOTE — Telephone Encounter (Signed)
Left a message for patient to call me. 

## 2011-10-22 NOTE — Telephone Encounter (Signed)
Spoke with patient and gave her Dr. Brodie's recommendation. Rx sent to pharmacy. 

## 2011-10-27 ENCOUNTER — Encounter: Payer: Self-pay | Admitting: Nurse Practitioner

## 2011-10-27 ENCOUNTER — Other Ambulatory Visit (HOSPITAL_COMMUNITY): Payer: Medicaid Other

## 2011-10-27 ENCOUNTER — Ambulatory Visit (INDEPENDENT_AMBULATORY_CARE_PROVIDER_SITE_OTHER): Payer: Medicaid Other | Admitting: Nurse Practitioner

## 2011-10-27 VITALS — BP 100/70 | HR 64 | Ht 66.5 in | Wt 136.8 lb

## 2011-10-27 DIAGNOSIS — K5900 Constipation, unspecified: Secondary | ICD-10-CM

## 2011-10-27 DIAGNOSIS — R1310 Dysphagia, unspecified: Secondary | ICD-10-CM | POA: Insufficient documentation

## 2011-10-27 DIAGNOSIS — R1013 Epigastric pain: Secondary | ICD-10-CM | POA: Insufficient documentation

## 2011-10-27 DIAGNOSIS — R110 Nausea: Secondary | ICD-10-CM | POA: Insufficient documentation

## 2011-10-27 MED ORDER — ESOMEPRAZOLE MAGNESIUM 40 MG PO PACK: 40.0000 mg | PACK | Freq: Every day | ORAL | Status: DC

## 2011-10-27 MED ORDER — NA SULFATE-K SULFATE-MG SULF 17.5-3.13-1.6 GM/177ML PO SOLN: 1.0000 | Freq: Once | ORAL | Status: AC

## 2011-10-27 NOTE — Patient Instructions (Addendum)
We made you an appointment with Dr. Juanda Chance on 12-01-2011. At that appointment she can discuss with you when her hospital week is in October to do the Endoscopy. We have given you a Suprep to help purge the bowels.  Directions on the side of the box.  After taking the Suprep, DAILY take 1 dose of Miralax twice daily.   Also, 3 times a week take 1 Dulcolax tablet.  After 2 weeks of this if you are not doing any better with constipation,  We have given you samples of Linzess 145 mcg to take 1 daily.

## 2011-10-27 NOTE — Progress Notes (Addendum)
[name] [id number] December 13, 1960   HISTORY OR PRESENT ILLNESS :  Ms. Poznanski is a 51 year old female with multiple medical problems. Patient is known to Dr. Juanda Chance for history of colonic inertia, she is here today with multiple GI complaints including nausea, epigastric pain, dysphasia, anorexia, right lower quadrant pain, and ongoing constipation.  I saw her June 2012 for severe constipation, bowels were purged and patient started on Amitiza. Somewhere in between then and now the Amitiza was stopped. She takes a capful of Miralax every day and Dulcolax almost daily.    Patient gives a several month history of nausea, epigastric discomfort, and basic swallowing problem. Patient does not feel food goes down the esophagus like it should though nothing actually gets stuck. No NSAID use. She is on daily Prilosec. Nexium used to cause loose stool, she would like to change back to Nexium in hopes of helping constipation.   Current Medications, Allergies, Past Medical History, Past Surgical History, Family History and Social History were reviewed in [employer] record.   PHYSICAL EXAMINATION : General:  thin  female in no acute distress Head: Normocephalic and atraumatic Eyes:  sclerae anicteric,conjunctive pink. Ears: Normal auditory acuity Neck: Supple, no masses.  Lungs: Clear throughout to auscultation. Portable oxygen tank. Heart: Regular rate and rhythm Abdomen: Soft, nondistended, mild right mid abdominal tenderness. No masses or hepatomegaly noted. Normal bowel sounds Musculoskeletal: Symmetrical with no gross deformities  Skin: No lesions on visible extremities Extremities: No edema or deformities noted Neurological: Oriented x 4, grossly nonfocal Cervical Nodes:  No significant cervical adenopathy Psychological:  Alert and cooperative. Normal mood and affect  ASSESSMENT AND PLAN :  1. Colonic inertia, patient extremely frustrated with ongoing constipation and  RLQ pain. I am doubtful she has given Miralax an adequate try as she has never taken it more than once daily. First, she will need another bowel purge.. Following that, recommend she try MiraLax every day twice a day for at least 2 weeks. She can continue Dulcolax three times a week. After 2 weeks if no results she will add Linzess. If constipation refractory to above, consider referral to motility clinic.  2. Addended 11/19/11.   Nausea, epigastric discomfort, anorexia and vague swallowing problems. Symptoms have been going on for about 5 weeks. She is already on a daily PPI.  Will change her Prilosec to Nexium, samples given. We discussed EGD. Her weight is the same as it was in Dec 2012. I think we need to get constipation under better control and see if symptoms improve. If not then she will probably need EGD with Propofol. Procedure will probably need to be done at hospital as she is on oxygen.Follow up with Dr. Juanda Chance, need for EGD can be decided at that point.     Addendum: Reviewed and agree with initial management. Beverley Fiedler, MD

## 2011-10-28 ENCOUNTER — Telehealth: Payer: Self-pay | Admitting: *Deleted

## 2011-10-28 NOTE — Telephone Encounter (Signed)
Patient calling because she is not eating much only taking liquids for 4-5 weeks. She is wanting to know if she can get something to increase her appetite. Please, advise.

## 2011-11-03 ENCOUNTER — Telehealth: Payer: Self-pay | Admitting: *Deleted

## 2011-11-03 NOTE — Telephone Encounter (Signed)
She can drink 3 cans of Ensure or Boost daily to boost calories.

## 2011-11-03 NOTE — Telephone Encounter (Signed)
I called and left a message with the patient's husband. She was just getting in the shower.  I advised him per Willette Cluster ACNP for the pt to drink 3 cans of either Ensure or Boost daily.  I told him to have her call us on Friday and let us know how she is doing.

## 2011-11-04 NOTE — Telephone Encounter (Signed)
Left a message for patient. 

## 2011-11-04 NOTE — Telephone Encounter (Signed)
Spoke with patient and gave her Willette Cluster, NP recommendations. She states she is drinking Boost. She is concerned about waiting to see Dr. Juanda Chance on 12/01/11. States she has already had 4-5 weeks of problems and now has to wait again. Patient is asking to set up EGD and not wait on appointment. Please, advise.

## 2011-11-10 ENCOUNTER — Other Ambulatory Visit: Payer: Self-pay | Admitting: Internal Medicine

## 2011-11-11 ENCOUNTER — Other Ambulatory Visit: Payer: Self-pay | Admitting: *Deleted

## 2011-11-11 MED ORDER — LACTULOSE 10 GM/15ML PO SOLN: ORAL | Status: DC

## 2011-11-17 ENCOUNTER — Other Ambulatory Visit: Payer: Self-pay | Admitting: Pulmonary Disease

## 2011-11-17 ENCOUNTER — Ambulatory Visit: Payer: Medicaid Other

## 2011-11-19 NOTE — Telephone Encounter (Signed)
Per Willette Cluster, FNP, patient needs to follow her advice given at the time of her office visit. If she would like any further testing, she must follow up with Dr Juanda Chance first. I have spoken to patient and have advised her that Gunnar Fusi would like her to follow prior recommendations until she gets Dr Regino Schultze opinion. Patient verbalizes understanding.

## 2011-11-26 ENCOUNTER — Other Ambulatory Visit: Payer: Self-pay | Admitting: Obstetrics and Gynecology

## 2011-12-01 ENCOUNTER — Ambulatory Visit (INDEPENDENT_AMBULATORY_CARE_PROVIDER_SITE_OTHER): Payer: Medicaid Other | Admitting: Internal Medicine

## 2011-12-01 ENCOUNTER — Encounter: Payer: Self-pay | Admitting: Internal Medicine

## 2011-12-01 VITALS — BP 120/64 | HR 100 | Ht 66.5 in | Wt 136.2 lb

## 2011-12-01 DIAGNOSIS — K599 Functional intestinal disorder, unspecified: Secondary | ICD-10-CM

## 2011-12-01 DIAGNOSIS — R110 Nausea: Secondary | ICD-10-CM

## 2011-12-01 MED ORDER — SUCRALFATE 1 G PO TABS: 1.0000 g | ORAL_TABLET | Freq: Two times a day (BID) | ORAL | Status: DC

## 2011-12-01 MED ORDER — LACTULOSE 10 GM/15ML PO SOLN: 30.0000 mL | Freq: Three times a day (TID) | ORAL | Status: DC

## 2011-12-01 MED ORDER — ONDANSETRON HCL 4 MG PO TABS: 4.0000 mg | ORAL_TABLET | Freq: Three times a day (TID) | ORAL | Status: DC | PRN

## 2011-12-01 NOTE — Patient Instructions (Addendum)
You have been scheduled for an endoscopy. Please follow written instructions given to you at your visit today. If you use inhalers (even only as needed), please bring them with you on the day of your procedure. We have sent the following medications to your pharmacy for you to pick up at your convenience: Carafate twice daily Zofran every 8 hours as needed Kristalose 2 tablespoons three times daily CC: Dr Aida Puffer

## 2011-12-01 NOTE — Progress Notes (Signed)
[name] 02/09/1961 MRN [id number]    History of Present Illness:  This is a 51 year old white female with colonic inertia, nausea, vomiting and dyspepsia. She has a strong family history of gallbladder disease in her mother, sister and paternal uncle. She just had a negative upper abdominal ultrasound and HIDA scan with normal ejection fraction. She had some crampy abdominal pain after the CCK infusion . She has oxygen-dependent COPD. She has been on Prilosec 20 mg twice a day for gastroesophageal reflux and dyspepsia. She tried Nexium but did not like it. Her last colonoscopy in September 2011 showed a redundant slightly dilated colon suggestive of inertia. A CT scan of the abdomen showed a thickened bladder and increased stool burden. She saw Paula Guenther, NP in August 2013. An upper endoscopy was considered in case Nexium did not work.   Past Medical History  Diagnosis Date  . Chronic headache   . TIA (transient ischemic attack)   . PTSD (post-traumatic stress disorder)   . Bipolar disorder   . COPD (chronic obstructive pulmonary disease)   . GERD (gastroesophageal reflux disease)   . Hepatomegaly     hx  . Colonic inertia   . Iron deficiency anemia   . Lung abscess    Past Surgical History  Procedure Date  . Vein surgery     transplant  . Cesarean section     x 2    reports that she has been smoking.  She has never used smokeless tobacco. She reports that she does not drink alcohol or use illicit drugs. family history includes Asthma in her son; Clotting disorder in her mother; Emphysema in her father; Ovarian cancer in her sister; and Rheum arthritis in her brother and sister. Allergies  Allergen Reactions  . Codeine Other (See Comments)    unknown  . Hydrocodone Other (See Comments)    unknown  . Oxycodone-Acetaminophen     Tylox        Review of Systems: Denies dysphagia. Positive for abdominal epigastric pain constipation constant bloating and nausea  The  remainder of the 10 point ROS is negative except as outlined in H&P   Physical Exam: General appearance  Well developed, in no distress.appears nervous, speaks fast with pressured expressions Eyes- non icteric. HEENT nontraumatic, normocephalic. Mouth no lesions, tongue papillated,  Cheilosis.mouth corners Neck supple without adenopathy, thyroid not enlarged, no carotid bruits, no JVD. Lungs Clear to auscultation bilaterally. Cor normal S1, normal S2, regular rhythm, no murmur,  quiet precordium. Abdomen: Tender mostly in epigastrium and right upper quadrant. No palpable mass. No rebound. Normal active bowel sounds.liver not enlarged by percussion Extremities no pedal edema. Skin no lesions. Neurological alert and oriented x 3. Psychological nervous with pressured affect. Patient seems to be somewhat agitated.  Assessment and Plan:  Problem #1 Chronic nausea and dyspepsia. Patient has normal gallbladder studies despite a strong family history of gallbladder disease. She has had no response to proton pump inhibitor I suspect gastrittis or PUD. We will proceed with an upper endoscopy and biopsies. I have given her a prescription for Zofran 4 mg when necessary for nausea. We will also add Carafate 1 g by mouth twice a day.  Problem #2 Colonic inertia. We will try lactulose 30 cc 3 times a day. She is up-to-date on her colonoscopy.   12/01/2011 Jasper Hanf  

## 2011-12-02 ENCOUNTER — Other Ambulatory Visit: Payer: Self-pay | Admitting: Obstetrics and Gynecology

## 2011-12-02 DIAGNOSIS — N644 Mastodynia: Secondary | ICD-10-CM

## 2011-12-08 ENCOUNTER — Ambulatory Visit
Admission: RE | Admit: 2011-12-08 | Discharge: 2011-12-08 | Disposition: A | Discharge status: Home / Self Care | Payer: Medicaid Other | Source: Ambulatory Visit | Attending: Obstetrics and Gynecology | Admitting: Obstetrics and Gynecology

## 2011-12-08 ENCOUNTER — Other Ambulatory Visit: Payer: Self-pay | Admitting: Obstetrics and Gynecology

## 2011-12-08 DIAGNOSIS — N644 Mastodynia: Secondary | ICD-10-CM

## 2011-12-09 ENCOUNTER — Telehealth: Payer: Self-pay | Admitting: Pulmonary Disease

## 2011-12-09 NOTE — Telephone Encounter (Signed)
Pt aware we have the flu vaccine that covers 3 strands of influenza. Pt verbalized understanding.

## 2011-12-23 ENCOUNTER — Ambulatory Visit (HOSPITAL_COMMUNITY)
Admission: RE | Admit: 2011-12-23 | Discharge: 2011-12-23 | Disposition: A | Discharge status: Home / Self Care | Payer: Medicaid Other | Source: Ambulatory Visit | Attending: Internal Medicine | Admitting: Internal Medicine

## 2011-12-23 ENCOUNTER — Encounter (HOSPITAL_COMMUNITY)
Admission: RE | Disposition: A | Discharge status: Home / Self Care | Payer: Self-pay | Source: Ambulatory Visit | Attending: Internal Medicine

## 2011-12-23 ENCOUNTER — Encounter (HOSPITAL_COMMUNITY): Payer: Self-pay | Admitting: *Deleted

## 2011-12-23 DIAGNOSIS — J4489 Other specified chronic obstructive pulmonary disease: Secondary | ICD-10-CM | POA: Insufficient documentation

## 2011-12-23 DIAGNOSIS — K219 Gastro-esophageal reflux disease without esophagitis: Secondary | ICD-10-CM | POA: Insufficient documentation

## 2011-12-23 DIAGNOSIS — K3189 Other diseases of stomach and duodenum: Secondary | ICD-10-CM | POA: Insufficient documentation

## 2011-12-23 DIAGNOSIS — R110 Nausea: Secondary | ICD-10-CM

## 2011-12-23 DIAGNOSIS — Z8673 Personal history of transient ischemic attack (TIA), and cerebral infarction without residual deficits: Secondary | ICD-10-CM | POA: Insufficient documentation

## 2011-12-23 DIAGNOSIS — J449 Chronic obstructive pulmonary disease, unspecified: Secondary | ICD-10-CM | POA: Insufficient documentation

## 2011-12-23 DIAGNOSIS — R112 Nausea with vomiting, unspecified: Secondary | ICD-10-CM

## 2011-12-23 HISTORY — DX: Unspecified osteoarthritis, unspecified site: M19.90

## 2011-12-23 HISTORY — DX: Major depressive disorder, single episode, unspecified: F32.9

## 2011-12-23 HISTORY — DX: Fibromyalgia: M79.7

## 2011-12-23 HISTORY — DX: Unspecified convulsions: R56.9

## 2011-12-23 HISTORY — DX: Cerebral infarction, unspecified: I63.9

## 2011-12-23 HISTORY — DX: Depression, unspecified: F32.A

## 2011-12-23 HISTORY — PX: ESOPHAGOGASTRODUODENOSCOPY: SHX5428

## 2011-12-23 SURGERY — EGD (ESOPHAGOGASTRODUODENOSCOPY)
Anesthesia: Moderate Sedation

## 2011-12-23 MED ORDER — SODIUM CHLORIDE 0.9 % IV SOLN
INTRAVENOUS | Status: DC
  Administered 2011-12-23: 10:00:00 via INTRAVENOUS

## 2011-12-23 MED ORDER — MIDAZOLAM HCL 10 MG/2ML IJ SOLN
INTRAMUSCULAR | Status: AC
  Filled 2011-12-23: qty 2

## 2011-12-23 MED ORDER — FENTANYL CITRATE 0.05 MG/ML IJ SOLN
INTRAMUSCULAR | Status: AC
  Filled 2011-12-23: qty 2

## 2011-12-23 MED ORDER — BUTAMBEN-TETRACAINE-BENZOCAINE 2-2-14 % EX AERO
INHALATION_SPRAY | CUTANEOUS | Status: DC | PRN
  Administered 2011-12-23: 2 via TOPICAL

## 2011-12-23 MED ORDER — MIDAZOLAM HCL 10 MG/2ML IJ SOLN
INTRAMUSCULAR | Status: DC | PRN
  Administered 2011-12-23 (×4): 2 mg via INTRAVENOUS

## 2011-12-23 MED ORDER — FENTANYL CITRATE 0.05 MG/ML IJ SOLN
INTRAMUSCULAR | Status: DC | PRN
  Administered 2011-12-23 (×3): 25 ug via INTRAVENOUS

## 2011-12-23 NOTE — Interval H&P Note (Signed)
History and Physical Interval Note:  12/23/2011 8:24 AM  [name]  has presented today for surgery, with the diagnosis of nausea  The various methods of treatment have been discussed with the patient and family. After consideration of risks, benefits and other options for treatment, the patient has consented to  Procedure(s) (LRB) with comments: ESOPHAGOGASTRODUODENOSCOPY (EGD) (N/A) as a surgical intervention .  The patient's history has been reviewed, patient examined, no change in status, stable for surgery.  I have reviewed the patient's chart and labs.  Questions were answered to the patient's satisfaction.     [name]

## 2011-12-23 NOTE — H&P (View-Only) (Signed)
[name] 1960/03/10 MRN [id number]    History of Present Illness:  This is a 51 year old white female with colonic inertia, nausea, vomiting and dyspepsia. She has a strong family history of gallbladder disease in her mother, sister and paternal uncle. She just had a negative upper abdominal ultrasound and HIDA scan with normal ejection fraction. She had some crampy abdominal pain after the CCK infusion . She has oxygen-dependent COPD. She has been on Prilosec 20 mg twice a day for gastroesophageal reflux and dyspepsia. She tried Nexium but did not like it. Her last colonoscopy in September 2011 showed a redundant slightly dilated colon suggestive of inertia. A CT scan of the abdomen showed a thickened bladder and increased stool burden. She saw Willette Cluster, NP in August 2013. An upper endoscopy was considered in case Nexium did not work.   Past Medical History  Diagnosis Date  . Chronic headache   . TIA (transient ischemic attack)   . PTSD (post-traumatic stress disorder)   . Bipolar disorder   . COPD (chronic obstructive pulmonary disease)   . GERD (gastroesophageal reflux disease)   . Hepatomegaly     hx  . Colonic inertia   . Iron deficiency anemia   . Lung abscess    Past Surgical History  Procedure Date  . Vein surgery     transplant  . Cesarean section     x 2    reports that she has been smoking.  She has never used smokeless tobacco. She reports that she does not drink alcohol or use illicit drugs. family history includes Asthma in her son; Clotting disorder in her mother; Emphysema in her father; Ovarian cancer in her sister; and Rheum arthritis in her brother and sister. Allergies  Allergen Reactions  . Codeine Other (See Comments)    unknown  . Hydrocodone Other (See Comments)    unknown  . Oxycodone-Acetaminophen     Tylox        Review of Systems: Denies dysphagia. Positive for abdominal epigastric pain constipation constant bloating and nausea  The  remainder of the 10 point ROS is negative except as outlined in H&P   Physical Exam: General appearance  Well developed, in no distress.appears nervous, speaks fast with pressured expressions Eyes- non icteric. HEENT nontraumatic, normocephalic. Mouth no lesions, tongue papillated,  Cheilosis.mouth corners Neck supple without adenopathy, thyroid not enlarged, no carotid bruits, no JVD. Lungs Clear to auscultation bilaterally. Cor normal S1, normal S2, regular rhythm, no murmur,  quiet precordium. Abdomen: Tender mostly in epigastrium and right upper quadrant. No palpable mass. No rebound. Normal active bowel sounds.liver not enlarged by percussion Extremities no pedal edema. Skin no lesions. Neurological alert and oriented x 3. Psychological nervous with pressured affect. Patient seems to be somewhat agitated.  Assessment and Plan:  Problem #1 Chronic nausea and dyspepsia. Patient has normal gallbladder studies despite a strong family history of gallbladder disease. She has had no response to proton pump inhibitor I suspect gastrittis or PUD. We will proceed with an upper endoscopy and biopsies. I have given her a prescription for Zofran 4 mg when necessary for nausea. We will also add Carafate 1 g by mouth twice a day.  Problem #2 Colonic inertia. We will try lactulose 30 cc 3 times a day. She is up-to-date on her colonoscopy.   12/01/2011 [name]

## 2011-12-23 NOTE — Op Note (Addendum)
[facility] [address] [place], [address]   ENDOSCOPY PROCEDURE REPORT  PATIENT: [name], [name]  MR#: [id number] BIRTHDATE: [birth year] , 51  yrs. old GENDER: Female ENDOSCOPIST: Hart Carwin, MD REFERRED BY:  Aida Puffer, M.D. PROCEDURE DATE:  12/23/2011 PROCEDURE:  EGD, diagnostic ASA CLASS:     Class III INDICATIONS:  nausea.   vomiting.   positive family hx of gall bladder disease,negative HIDA,,O2 dependent COPD. MEDICATIONS: These medications were titrated to patient response per physician's verbal order, Fentanyl-Detailed 75 mcg IV, and Versed-Detailed 8 mg IV TOPICAL ANESTHETIC: Cetacaine Spray  DESCRIPTION OF PROCEDURE: After the risks benefits and alternatives of the procedure were thoroughly explained, informed consent was obtained.  The Pentax Gastroscope [id number] endoscope was introduced through the mouth and advanced to the second portion of the duodenum. Without limitations.  The instrument was slowly withdrawn as the mucosa was fully examined.    The upper, middle and distal third of the esophagus were carefully inspected and no abnormalities were noted.  The z-line was well seen at the GEJ.  The endoscope was pushed into the fundus which was normal including a retroflexed view.  The antrum, gastric body, first and second part of the duodenum were unremarkable. Retroflexed views revealed no abnormalities.     The scope was then withdrawn from the patient and the procedure completed.  COMPLICATIONS: There were no complications. ENDOSCOPIC IMPRESSION: Normal EGD , nothing to account for N&V  RECOMMENDATIONS: continue PPI, Carafate and Zofran REPEAT EXAM: no repeat EGD  eSigned:  Hart Carwin, MD 12/23/2011 11:12 AM   CC:

## 2011-12-24 ENCOUNTER — Encounter (HOSPITAL_COMMUNITY): Payer: Self-pay

## 2011-12-24 ENCOUNTER — Encounter (HOSPITAL_COMMUNITY): Payer: Self-pay | Admitting: Internal Medicine

## 2012-01-14 ENCOUNTER — Telehealth: Payer: Self-pay | Admitting: Pulmonary Disease

## 2012-01-14 NOTE — Telephone Encounter (Signed)
Spoke with pt She is c/o dry, stuffy nose Does not want to take the nasonex anymore since this causes HA's  I advised KC out of the office until tomorrow, and will ask him for recs, but in the meantime can try saline NS otc She verbalized understanding KC, please advise, thanks!

## 2012-01-15 NOTE — Telephone Encounter (Signed)
Agree with nasal saline spray.   Can also take otc antihistamine such as zyrtec, allegra, or claritin.

## 2012-01-15 NOTE — Telephone Encounter (Signed)
lmomtcb  

## 2012-01-15 NOTE — Telephone Encounter (Signed)
mucinex is fine, would avoid asirin unless prescribed by a neurologist, and can take ibuprofen and tylenol ok.

## 2012-01-15 NOTE — Telephone Encounter (Signed)
Pt called back and she is aware of KC recs for the nasal saline spray and otc antihistamine such as Zyrtec, allegra or claritin.  Pt stated that she has questions about taking the mucinex.  Is this ok for her to take with her breathing issues and pt also wanted to know about taking tylenol, aspirin or ibuprofen for her headaches.  KC please advise.  thanks

## 2012-01-18 NOTE — Telephone Encounter (Signed)
lmomtcb x1 

## 2012-01-18 NOTE — Telephone Encounter (Signed)
Pt returned call. [name]  

## 2012-01-18 NOTE — Telephone Encounter (Signed)
Spoke with pt and notified of recs per KC She verbalized understanding and states nothing further needed 

## 2012-01-21 ENCOUNTER — Telehealth (HOSPITAL_COMMUNITY): Payer: Self-pay | Admitting: *Deleted

## 2012-01-21 NOTE — Telephone Encounter (Signed)
Several calls have been made to this patient regarding Pulmonary Rehab Referral.  She has been approved for financial assistance, however we have been unable to speak with her.  Letter has been mailed to her to contact us.

## 2012-01-26 ENCOUNTER — Telehealth: Payer: Self-pay | Admitting: Pulmonary Disease

## 2012-01-26 NOTE — Telephone Encounter (Signed)
Pt returned call. [name]  

## 2012-01-26 NOTE — Telephone Encounter (Signed)
KC, are you okay with giving an order for this or would you rather patient buy this out of pocket at a medical store?

## 2012-01-26 NOTE — Telephone Encounter (Signed)
If she just wants something to help her sleep more upright, it would save her a lot of money to go by [employer] or [facility].

## 2012-01-26 NOTE — Telephone Encounter (Signed)
LMTCB x 1 

## 2012-01-27 NOTE — Telephone Encounter (Signed)
Called, spoke with pt. Informed her of below per [facility].  She verbalized understanding and states Medicaid will pay for this through [facility] with an order.  KC, are you ok with order being sent?

## 2012-01-27 NOTE — Telephone Encounter (Signed)
Called [employer] and spoke with "Q" to see what guidelines they go by when ordering wedge pillows.  Per Q, Medicaid does not reimburse for wedge pillows.  Pt could try to speak with someone w/ Medicaid to see if she herself would be reimbursed.  Called spoke with patient who reiterated that she is not able to afford a wedge pillow at this time, even if she could be reimbursed.  Pt stated that it was either [facility] or [facility].  Called spoke with Lucretia w/ [facility] who needed to check on this > call her back in 5 mins.  Called Lucretia who stated that Medicaid does not reimburse them for wedge pillows.  Lucretia recommended she try using several pillows to elevate her head.  Called spoke with patient, advised that [facility] stated that Medicaid will not reimburse for a wedge pillow.  Before could discuss with patient other options, she said "okay, thank you" and hung up.  Will sign and forward to [facility] as FYI.

## 2012-01-27 NOTE — Telephone Encounter (Signed)
I have no idea what to order, what angle, how large, etc.  If there is anyone to give me some guidance, I am not opposed to giving her a prescription.   I still think [facility] is her best bet.

## 2012-02-02 ENCOUNTER — Other Ambulatory Visit: Payer: Self-pay | Admitting: *Deleted

## 2012-02-02 MED ORDER — LACTULOSE 10 GM/15ML PO SOLN: 30.0000 mL | Freq: Three times a day (TID) | ORAL | Status: DC

## 2012-02-12 ENCOUNTER — Other Ambulatory Visit (HOSPITAL_COMMUNITY): Payer: Self-pay | Admitting: Cardiovascular Disease

## 2012-02-12 DIAGNOSIS — R0602 Shortness of breath: Secondary | ICD-10-CM

## 2012-02-16 ENCOUNTER — Ambulatory Visit (HOSPITAL_COMMUNITY): Payer: Medicaid Other

## 2012-02-16 ENCOUNTER — Telehealth (HOSPITAL_COMMUNITY): Payer: Self-pay | Admitting: *Deleted

## 2012-02-16 NOTE — Telephone Encounter (Signed)
[name] was scheduled for orientation to Pulmonary Rehab on 02/16/2012.  She called on 02/15/2012 requesting cancel due to "family crisis".  She did not reschedule.   Cathie Olden RN

## 2012-02-17 ENCOUNTER — Ambulatory Visit (HOSPITAL_COMMUNITY)
Admission: RE | Admit: 2012-02-17 | Discharge: 2012-02-17 | Disposition: A | Discharge status: Home / Self Care | Payer: Medicaid Other | Source: Ambulatory Visit | Attending: Cardiovascular Disease | Admitting: Cardiovascular Disease

## 2012-02-17 DIAGNOSIS — R0602 Shortness of breath: Secondary | ICD-10-CM

## 2012-02-17 DIAGNOSIS — J449 Chronic obstructive pulmonary disease, unspecified: Secondary | ICD-10-CM | POA: Insufficient documentation

## 2012-02-17 DIAGNOSIS — K219 Gastro-esophageal reflux disease without esophagitis: Secondary | ICD-10-CM | POA: Insufficient documentation

## 2012-02-17 DIAGNOSIS — F17200 Nicotine dependence, unspecified, uncomplicated: Secondary | ICD-10-CM | POA: Insufficient documentation

## 2012-02-17 DIAGNOSIS — J4489 Other specified chronic obstructive pulmonary disease: Secondary | ICD-10-CM | POA: Insufficient documentation

## 2012-02-17 NOTE — Progress Notes (Signed)
[facility]   2D echo completed 02/17/2012.   Cindy Blessing Ozga, RDCS   

## 2012-02-18 ENCOUNTER — Ambulatory Visit (HOSPITAL_COMMUNITY): Payer: Medicaid Other

## 2012-02-18 ENCOUNTER — Emergency Department (HOSPITAL_BASED_OUTPATIENT_CLINIC_OR_DEPARTMENT_OTHER)
Admission: EM | Admit: 2012-02-18 | Discharge: 2012-02-18 | Disposition: A | Discharge status: Home / Self Care | Payer: Medicaid Other | Attending: Emergency Medicine | Admitting: Emergency Medicine

## 2012-02-18 ENCOUNTER — Encounter (HOSPITAL_BASED_OUTPATIENT_CLINIC_OR_DEPARTMENT_OTHER): Payer: Self-pay | Admitting: *Deleted

## 2012-02-18 ENCOUNTER — Emergency Department (HOSPITAL_BASED_OUTPATIENT_CLINIC_OR_DEPARTMENT_OTHER): Payer: Medicaid Other

## 2012-02-18 DIAGNOSIS — Z8041 Family history of malignant neoplasm of ovary: Secondary | ICD-10-CM | POA: Insufficient documentation

## 2012-02-18 DIAGNOSIS — F17200 Nicotine dependence, unspecified, uncomplicated: Secondary | ICD-10-CM | POA: Insufficient documentation

## 2012-02-18 DIAGNOSIS — R209 Unspecified disturbances of skin sensation: Secondary | ICD-10-CM | POA: Insufficient documentation

## 2012-02-18 DIAGNOSIS — K219 Gastro-esophageal reflux disease without esophagitis: Secondary | ICD-10-CM | POA: Insufficient documentation

## 2012-02-18 DIAGNOSIS — Z8739 Personal history of other diseases of the musculoskeletal system and connective tissue: Secondary | ICD-10-CM | POA: Insufficient documentation

## 2012-02-18 DIAGNOSIS — Z8679 Personal history of other diseases of the circulatory system: Secondary | ICD-10-CM | POA: Insufficient documentation

## 2012-02-18 DIAGNOSIS — G40909 Epilepsy, unspecified, not intractable, without status epilepticus: Secondary | ICD-10-CM | POA: Insufficient documentation

## 2012-02-18 DIAGNOSIS — J4489 Other specified chronic obstructive pulmonary disease: Secondary | ICD-10-CM | POA: Insufficient documentation

## 2012-02-18 DIAGNOSIS — Z8673 Personal history of transient ischemic attack (TIA), and cerebral infarction without residual deficits: Secondary | ICD-10-CM | POA: Insufficient documentation

## 2012-02-18 DIAGNOSIS — G8929 Other chronic pain: Secondary | ICD-10-CM

## 2012-02-18 DIAGNOSIS — Z8659 Personal history of other mental and behavioral disorders: Secondary | ICD-10-CM | POA: Insufficient documentation

## 2012-02-18 DIAGNOSIS — K5900 Constipation, unspecified: Secondary | ICD-10-CM | POA: Insufficient documentation

## 2012-02-18 DIAGNOSIS — IMO0001 Reserved for inherently not codable concepts without codable children: Secondary | ICD-10-CM | POA: Insufficient documentation

## 2012-02-18 DIAGNOSIS — Z862 Personal history of diseases of the blood and blood-forming organs and certain disorders involving the immune mechanism: Secondary | ICD-10-CM | POA: Insufficient documentation

## 2012-02-18 DIAGNOSIS — G894 Chronic pain syndrome: Secondary | ICD-10-CM | POA: Insufficient documentation

## 2012-02-18 DIAGNOSIS — J449 Chronic obstructive pulmonary disease, unspecified: Secondary | ICD-10-CM | POA: Insufficient documentation

## 2012-02-18 DIAGNOSIS — Z79899 Other long term (current) drug therapy: Secondary | ICD-10-CM | POA: Insufficient documentation

## 2012-02-18 DIAGNOSIS — G5710 Meralgia paresthetica, unspecified lower limb: Secondary | ICD-10-CM | POA: Insufficient documentation

## 2012-02-18 DIAGNOSIS — R112 Nausea with vomiting, unspecified: Secondary | ICD-10-CM

## 2012-02-18 DIAGNOSIS — Z8261 Family history of arthritis: Secondary | ICD-10-CM | POA: Insufficient documentation

## 2012-02-18 DIAGNOSIS — R202 Paresthesia of skin: Secondary | ICD-10-CM

## 2012-02-18 LAB — URINALYSIS, ROUTINE W REFLEX MICROSCOPIC
Leukocytes, UA: NEGATIVE
Nitrite: NEGATIVE
Specific Gravity, Urine: 1.009 (ref 1.005–1.030)
Urobilinogen, UA: 1 mg/dL (ref 0.0–1.0)

## 2012-02-18 LAB — COMPREHENSIVE METABOLIC PANEL
ALT: 12 U/L (ref 0–35)
Alkaline Phosphatase: 78 U/L (ref 39–117)
CO2: 32 mEq/L (ref 19–32)
GFR calc Af Amer: >90 mL/min (ref >90)
Glucose, Bld: 151 mg/dL — ABNORMAL HIGH (ref 70–99)
Potassium: 3.9 mEq/L (ref 3.5–5.1)
Sodium: 138 mEq/L (ref 135–145)
Total Protein: 7.4 g/dL (ref 6.0–8.3)

## 2012-02-18 LAB — CBC WITH DIFFERENTIAL/PLATELET
Eosinophils Absolute: 0.1 10*3/uL (ref 0.0–0.7)
Lymphocytes Relative: 33 % (ref 12–46)
Lymphs Abs: 2.5 10*3/uL (ref 0.7–4.0)
Neutro Abs: 4.5 10*3/uL (ref 1.7–7.7)
Neutrophils Relative %: 58 % (ref 43–77)
Platelets: 256 10*3/uL (ref 150–400)
RBC: 4.7 MIL/uL (ref 3.87–5.11)
WBC: 7.7 10*3/uL (ref 4.0–10.5)

## 2012-02-18 MED ORDER — ONDANSETRON HCL 4 MG PO TABS: 4.0000 mg | ORAL_TABLET | Freq: Three times a day (TID) | ORAL | Status: DC | PRN

## 2012-02-18 MED ORDER — ONDANSETRON HCL 4 MG/2ML IJ SOLN
4.0000 mg | Freq: Once | INTRAMUSCULAR | Status: AC
  Administered 2012-02-18: 4 mg via INTRAVENOUS
  Filled 2012-02-18: qty 2

## 2012-02-18 MED ORDER — FENTANYL CITRATE 0.05 MG/ML IJ SOLN
50.0000 ug | Freq: Once | INTRAMUSCULAR | Status: AC
  Administered 2012-02-18: 50 ug via INTRAVENOUS
  Filled 2012-02-18: qty 2

## 2012-02-18 MED ORDER — SODIUM CHLORIDE 0.9 % IV BOLUS (SEPSIS)
1000.0000 mL | Freq: Once | INTRAVENOUS | Status: AC
  Administered 2012-02-18: 1000 mL via INTRAVENOUS

## 2012-02-18 MED ORDER — HYDROMORPHONE HCL PF 1 MG/ML IJ SOLN
1.0000 mg | Freq: Once | INTRAMUSCULAR | Status: AC
  Administered 2012-02-18: 1 mg via INTRAVENOUS
  Filled 2012-02-18: qty 1

## 2012-02-18 NOTE — ED Notes (Signed)
This RN asked pt if she could E-sign, confirming this RN has discussed her D/C instructions, medications and answered questions. Pt states, "No, I'm not signing anything" and walked out of room.

## 2012-02-18 NOTE — ED Provider Notes (Signed)
History     CSN: [id number]  Arrival date & time 02/18/12  1533   First MD Initiated Contact with Patient 02/18/12 1547      Chief Complaint  Patient presents with  . Nausea    (Consider location/radiation/quality/duration/timing/severity/associated sxs/prior treatment) HPI Pt with multiple medical problems reports she has had intermittent nausea and vomiting for the last several months, worse in the last few days, associated with decreased appetite and poor PO intake. She has had extensive outpatient workup of same including endoscopy two months ago which was normal. She also reports chronic constipation, presumably related to chronic narcotic use.  She has a second complaint of numbness/tingling on L foot and lower leg. Described as a pins and needles sensation, worse with standing, associated with warm feeling. She has never had this before, but she has chronic diffuse back pain.   Past Medical History  Diagnosis Date  . TIA (transient ischemic attack)   . GERD (gastroesophageal reflux disease)   . Hepatomegaly     hx  . Colonic inertia   . Lung abscess     "calcified over"; bilateral   . COPD (chronic obstructive pulmonary disease)   . Asthma   . Stroke 2009    TIA  . PTSD (post-traumatic stress disorder)   . Bipolar disorder   . Depression   . Chronic headache   . Seizures     in past  . Fibromyalgia   . Iron deficiency anemia   . Arthritis   . Pneumonia     Past Surgical History  Procedure Date  . 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 Cesarean section     x 2  . Esophagogastroduodenoscopy 12/23/2011    Procedure: ESOPHAGOGASTRODUODENOSCOPY (EGD);  Surgeon: Hart Carwin, MD;  Location: [facility];  Service: Endoscopy;  Laterality: N/A;    Family History  Problem Relation Age of Onset  . Emphysema Father   . Asthma Son   . Clotting disorder Mother   . Rheum arthritis Sister   . Rheum arthritis Brother   . Ovarian  cancer Sister     History  Substance Use Topics  . Smoking status: Current Every Day Smoker -- 1.0 packs/day for 39 years  . Smokeless tobacco: Never Used  . Alcohol Use: No    OB History    Grav Para Term Preterm Abortions TAB SAB Ect Mult Living                  Review of Systems All other systems reviewed and are negative except as noted in HPI.   Allergies  Codeine; Hydrocodone; and Oxycodone-acetaminophen  Home Medications   Current Outpatient Rx  Name  Route  Sig  Dispense  Refill  . ADVAIR DISKUS 250-50 MCG/DOSE IN AEPB      inhale 1 dose by mouth twice a day   60 each   5   . ALBUTEROL SULFATE (2.5 MG/3ML) 0.083% IN NEBU   Nebulization   Take 2.5 mg by nebulization every 6 (six) hours as needed. For shortness of breath         . ALBUTEROL SULFATE HFA 108 (90 BASE) MCG/ACT IN AERS   Inhalation   Inhale 2 puffs into the lungs every 6 (six) hours as needed.           . BUPROPION HCL ER (XL) 300 MG PO TB24   Oral   Take 300  mg by mouth daily.         Marland Kitchen CLONAZEPAM 2 MG PO TABS   Oral   Take 2 mg by mouth 3 (three) times daily.         Marland Kitchen DILTIAZEM HCL 120 MG PO TABS   Oral   Take 120 mg by mouth daily.         Marland Kitchen DEPAKOTE PO   Oral   Take by mouth. 1000 mg. Every night         . ESOMEPRAZOLE MAGNESIUM 40 MG PO PACK   Oral   Take 40 mg by mouth daily before breakfast.   30 each   12     Lot # [id number] Exp date [id number]   . LACTULOSE 10 GM/15ML PO SOLN   Oral   Take 30 mLs (20 g total) by mouth 3 (three) times daily. Take 45 cc po every morning   500 mL   1   . LACTULOSE PO   Oral   Take by mouth. 45 mg daily         . LEVOCETIRIZINE DIHYDROCHLORIDE 5 MG PO TABS   Oral   Take 5 mg by mouth every evening.         . MOMETASONE FUROATE 50 MCG/ACT NA SUSP   Nasal   Place 2 sprays into the nose 2 (two) times daily.          Marland Kitchen OMEPRAZOLE 20 MG PO CPDR   Oral   Take 20 mg by mouth 2 (two) times daily.         Marland Kitchen  ONDANSETRON HCL 4 MG PO TABS   Oral   Take 1 tablet (4 mg total) by mouth every 8 (eight) hours as needed for nausea.   30 tablet   1   . OXYCODONE HCL ER 10 MG PO TB12   Oral   Take 10 mg by mouth every 12 (twelve) hours as needed. For pain         . SPIRIVA HANDIHALER 18 MCG IN CAPS      inhale contents of 1 capsule by mouth once daily   30 capsule   6   . SUCRALFATE 1 G PO TABS   Oral   Take 1 tablet (1 g total) by mouth 2 (two) times daily.   60 tablet   1     BP 118/61  Pulse 107  Temp 97.5 F (36.4 C) (Oral)  Resp 16  Ht 5\' 7"  (1.702 m)  Wt 134 lb (60.782 kg)  BMI 20.99 kg/m2  SpO2 95%  Physical Exam  Nursing note and vitals reviewed. Constitutional: She is oriented to person, place, and time. She appears well-developed and well-nourished.  HENT:  Head: Normocephalic and atraumatic.  Eyes: EOM are normal. Pupils are equal, round, and reactive to light.  Neck: Normal range of motion. Neck supple.  Cardiovascular: Normal rate, normal heart sounds and intact distal pulses.   Pulmonary/Chest: Effort normal and breath sounds normal.  Abdominal: Bowel sounds are normal. She exhibits no distension. There is no tenderness.  Musculoskeletal: Normal range of motion. She exhibits no edema and no tenderness.  Neurological: She is alert and oriented to person, place, and time. She has normal strength. No cranial nerve deficit or sensory deficit (subjective paresthesia of LLE below knee in a stocking distribution).  Skin: Skin is warm and dry. No rash noted.  Psychiatric: She has a normal mood and affect.    ED Course  Procedures (including critical care time)  Labs Reviewed  COMPREHENSIVE METABOLIC PANEL - Abnormal; Notable for the following:    Glucose, Bld 151 (*)     All other components within normal limits  URINALYSIS, ROUTINE W REFLEX MICROSCOPIC  CBC WITH DIFFERENTIAL  LIPASE, BLOOD   Dg Abd Acute W/chest  02/18/2012  *RADIOLOGY REPORT*  Clinical Data:  Nausea, low grade fever, history of COPD, asthma, stroke, prior lung pneumonia lung abscess  ACUTE ABDOMEN SERIES (ABDOMEN 2 VIEW & CHEST 1 VIEW)  Comparison: Chest radiograph 09/30/2011, abdominal radiograph 06/13/2010  Findings: Normal heart size, mediastinal contours, and pulmonary vascularity. Calcified granuloma left upper lobe. Emphysematous and bronchitic changes compatible with COPD. Bilateral nipple shadows. No definite infiltrate, pleural effusion, or pneumothorax. Scarring upper lobes appear stable.  Nonobstructive bowel gas pattern. No bowel dilatation, bowel wall thickening or free intraperitoneal air. Osseous demineralization. No urinary tract calcification.  IMPRESSION: No acute abnormalities. COPD.   Original Report Authenticated By: Ulyses Southward, M.D.      No diagnosis found.    MDM  Labs unremarkable. Pt advised to see her PCP for refill of her chronic pain medications and that no narcotics would be prescribed for her in the ED. Offered NSAID rx which she declines. Will give Rx for zofran for her nausea.         Charles B. Bernette Mayers, MD 02/18/12 1610

## 2012-02-18 NOTE — ED Notes (Signed)
This RN offered wheelchair to assist pt to the car, pt states "my husband went to the car to get my walker". This RN offered pt wheelchair again. Pt states "I'll walk". This RN walking beside pt up to registration, pt yelled "get out of my way" to this RN. Security, Faylene Million offered wheelchair again to pt, pt refused, yelling at him.

## 2012-02-18 NOTE — ED Notes (Signed)
Pt called ED stating she needed her nausea medication called in to [facility] on [address]. Consulted with Dr. Bernette Mayers who approved. Rx for zofran 4mg  odt #12 tab called to pharmacy as requested.

## 2012-02-18 NOTE — ED Notes (Signed)
Patient transported to X-ray via stretcher 

## 2012-02-18 NOTE — ED Notes (Signed)
Nausea. Low grade fever. Pain in her left leg. Home oxygen 2l/m [place]. Hx of pneumonia and lung abscess.

## 2012-02-18 NOTE — ED Notes (Signed)
Pt had some questions about d/c.  Lauren ,RN ask the doctor about nthe concerns and I went back in with her to help with the explanation.  She wanted to know about pain meds until Monday because that when her doctor would be back  In the office and why if we couldn't tell her what was wrong with her leg,the she wanted to see someone else now.  She also wanted [place] to call and get her in tonight,which Dr. Bernette Mayers said he was not going to do for chronic pain.  Then she became very rude and hostile and stated she would not pay the bill.Since we had just given her dilaudid to go home on and a prescription for zofran  waiting in the pharmacy.  She and husband continued to argue about not getting meds for pain and her doctor would not be back in the office til Monday.  Pt continued to be rude and hostile.  She did finally admit on that her pain was better but her nausea was not.  Finally left still disgruntled.

## 2012-02-23 ENCOUNTER — Ambulatory Visit (HOSPITAL_COMMUNITY): Payer: Medicaid Other

## 2012-02-25 ENCOUNTER — Ambulatory Visit (HOSPITAL_COMMUNITY): Payer: Medicaid Other

## 2012-03-01 ENCOUNTER — Inpatient Hospital Stay (HOSPITAL_COMMUNITY): Admission: RE | Admit: 2012-03-01 | Payer: Medicaid Other | Source: Ambulatory Visit

## 2012-03-02 DIAGNOSIS — F1210 Cannabis abuse, uncomplicated: Secondary | ICD-10-CM

## 2012-03-02 HISTORY — DX: Cannabis abuse, uncomplicated: F12.10

## 2012-03-03 ENCOUNTER — Ambulatory Visit (HOSPITAL_COMMUNITY): Payer: Medicaid Other

## 2012-03-08 ENCOUNTER — Ambulatory Visit (HOSPITAL_COMMUNITY): Payer: Medicaid Other

## 2012-03-10 ENCOUNTER — Ambulatory Visit (HOSPITAL_COMMUNITY): Payer: Medicaid Other

## 2012-03-15 ENCOUNTER — Ambulatory Visit (HOSPITAL_COMMUNITY): Payer: Medicaid Other

## 2012-03-17 ENCOUNTER — Ambulatory Visit (HOSPITAL_COMMUNITY): Payer: Medicaid Other

## 2012-03-22 ENCOUNTER — Ambulatory Visit (HOSPITAL_COMMUNITY): Payer: Medicaid Other

## 2012-03-23 ENCOUNTER — Other Ambulatory Visit: Payer: Self-pay | Admitting: Pulmonary Disease

## 2012-03-23 MED ORDER — TIOTROPIUM BROMIDE MONOHYDRATE 18 MCG IN CAPS: 18.0000 ug | ORAL_CAPSULE | Freq: Every day | RESPIRATORY_TRACT | Status: DC

## 2012-03-23 NOTE — Telephone Encounter (Signed)
Pt hsa appt on 04-12-12

## 2012-03-24 ENCOUNTER — Ambulatory Visit (HOSPITAL_COMMUNITY): Payer: Medicaid Other

## 2012-03-29 ENCOUNTER — Ambulatory Visit (HOSPITAL_COMMUNITY): Payer: Medicaid Other

## 2012-03-31 ENCOUNTER — Ambulatory Visit (HOSPITAL_COMMUNITY): Payer: Medicaid Other

## 2012-04-05 ENCOUNTER — Ambulatory Visit (HOSPITAL_COMMUNITY): Payer: Medicaid Other

## 2012-04-05 ENCOUNTER — Telehealth: Payer: Self-pay | Admitting: Internal Medicine

## 2012-04-05 NOTE — Telephone Encounter (Signed)
Her PCP can refer her to an [facility] or to Dr Kinnie Scales or to Dr Loreta Ave.

## 2012-04-05 NOTE — Telephone Encounter (Signed)
Patient calling because she would like a referral somewhere else for her chronic constipation. States she cant handle it anymore. Please, advise.

## 2012-04-06 ENCOUNTER — Telehealth: Payer: Self-pay | Admitting: Internal Medicine

## 2012-04-06 DIAGNOSIS — K5909 Other constipation: Secondary | ICD-10-CM

## 2012-04-06 NOTE — Telephone Encounter (Signed)
I doubt surgeon would operate on her in the setting of O2 dependent COPD. She has not been seen in the office for over 2 years so she ought to be seen by me first. She saw Gunnar Fusi) To go to a surgeon at this time would not be productive.

## 2012-04-06 NOTE — Telephone Encounter (Signed)
Left a message for patient to call me. 

## 2012-04-06 NOTE — Telephone Encounter (Signed)
Patient given Dr. Brodie's recommendation. 

## 2012-04-06 NOTE — Telephone Encounter (Signed)
Patient states she is looking for a surgical referral to take out the colon that is not working. She states Dr. Juanda Chance told her about having this back in 2011 when she had her colonoscopy. She states she does not want another GI MD. Please, advise.

## 2012-04-07 ENCOUNTER — Ambulatory Visit (HOSPITAL_COMMUNITY): Payer: Medicaid Other

## 2012-04-07 NOTE — Telephone Encounter (Signed)
I have spoken to the pt at length ( at least 30 minutes). She will start Miralax 17 gm am and Dulcolox 2 hs. I offered to refer her to [facility] in [facility]. She agrees to go there. Please make a referral to [facility] division of GI dismot[facility], and send my notes. Do they call her with an appointment or they will call us. I asked the pt to call us in 1-2 weeks with an update.Consider adding Linzess .

## 2012-04-07 NOTE — Telephone Encounter (Signed)
Last OV 12/01/11 per patient and she wants to know what would change with another OV. Please, advise.

## 2012-04-07 NOTE — Telephone Encounter (Signed)
Left a message for patient to call me. 

## 2012-04-08 NOTE — Telephone Encounter (Signed)
Patient notified of the process for referral to [facility].

## 2012-04-08 NOTE — Telephone Encounter (Signed)
Faxed records to [facility] for review.

## 2012-04-08 NOTE — Telephone Encounter (Signed)
Spoke with [facility] [phone]) and they have received our referral. They will call the patient with the appointment and fax [place] the appointment information.

## 2012-04-08 NOTE — Telephone Encounter (Signed)
Left a message for patient to call me. 

## 2012-04-12 ENCOUNTER — Ambulatory Visit (HOSPITAL_COMMUNITY): Payer: Medicaid Other

## 2012-04-12 ENCOUNTER — Ambulatory Visit: Payer: Medicaid Other | Admitting: Pulmonary Disease

## 2012-04-14 ENCOUNTER — Ambulatory Visit (HOSPITAL_COMMUNITY): Payer: Medicaid Other

## 2012-04-19 ENCOUNTER — Ambulatory Visit (HOSPITAL_COMMUNITY): Payer: Medicaid Other

## 2012-04-21 ENCOUNTER — Ambulatory Visit (HOSPITAL_COMMUNITY): Payer: Medicaid Other

## 2012-04-25 ENCOUNTER — Ambulatory Visit (INDEPENDENT_AMBULATORY_CARE_PROVIDER_SITE_OTHER): Payer: Self-pay | Admitting: Surgery

## 2012-04-26 ENCOUNTER — Ambulatory Visit (HOSPITAL_COMMUNITY): Payer: Medicaid Other

## 2012-04-28 ENCOUNTER — Ambulatory Visit (HOSPITAL_COMMUNITY): Payer: Medicaid Other

## 2012-05-02 ENCOUNTER — Ambulatory Visit: Payer: Medicaid Other | Admitting: Pulmonary Disease

## 2012-05-03 ENCOUNTER — Ambulatory Visit (HOSPITAL_COMMUNITY): Payer: Medicaid Other

## 2012-05-05 ENCOUNTER — Ambulatory Visit (HOSPITAL_COMMUNITY): Payer: Medicaid Other

## 2012-05-10 ENCOUNTER — Ambulatory Visit (HOSPITAL_COMMUNITY): Payer: Medicaid Other

## 2012-05-10 ENCOUNTER — Telehealth: Payer: Self-pay | Admitting: *Deleted

## 2012-05-10 NOTE — Telephone Encounter (Signed)
Called [facility] to verify if patient has been scheduled to see them. Was told patient is going to see Dr. Dorita Fray but has not been scheduled yet. Appointment will be in April. Patient should be scheduled in the next few weeks.

## 2012-05-12 ENCOUNTER — Ambulatory Visit (HOSPITAL_COMMUNITY): Payer: Medicaid Other

## 2012-05-17 ENCOUNTER — Ambulatory Visit (HOSPITAL_COMMUNITY): Payer: Medicaid Other

## 2012-05-19 ENCOUNTER — Ambulatory Visit (HOSPITAL_COMMUNITY): Payer: Medicaid Other

## 2012-05-24 ENCOUNTER — Ambulatory Visit (HOSPITAL_COMMUNITY): Payer: Medicaid Other

## 2012-05-26 ENCOUNTER — Ambulatory Visit (HOSPITAL_COMMUNITY): Payer: Medicaid Other

## 2012-05-31 ENCOUNTER — Ambulatory Visit (HOSPITAL_COMMUNITY): Payer: Medicaid Other

## 2012-06-02 ENCOUNTER — Other Ambulatory Visit: Payer: Self-pay | Admitting: Pulmonary Disease

## 2012-06-02 ENCOUNTER — Ambulatory Visit (HOSPITAL_COMMUNITY): Payer: Medicaid Other

## 2012-06-02 ENCOUNTER — Telehealth: Payer: Self-pay | Admitting: *Deleted

## 2012-06-02 NOTE — Telephone Encounter (Signed)
Spoke with [facility] and scheduled referral 08/08/12 1:20 PM Dr. Mills Koller.[facility])  Patient notified of appointment date and time.

## 2012-06-02 NOTE — Telephone Encounter (Signed)
Message copied by Daphine Deutscher on Thu Jun 02, 2012  3:59 PM ------      Message from: Daphine Deutscher      Created: Mon May 23, 2012 11:29 AM       Did patient get appointment at [facility]? ------

## 2012-07-07 ENCOUNTER — Other Ambulatory Visit: Payer: Self-pay | Admitting: Pulmonary Disease

## 2012-07-13 ENCOUNTER — Ambulatory Visit (INDEPENDENT_AMBULATORY_CARE_PROVIDER_SITE_OTHER)
Admission: RE | Admit: 2012-07-13 | Discharge: 2012-07-13 | Disposition: A | Discharge status: Home / Self Care | Payer: Medicaid Other | Source: Ambulatory Visit | Attending: Pulmonary Disease | Admitting: Pulmonary Disease

## 2012-07-13 ENCOUNTER — Ambulatory Visit (INDEPENDENT_AMBULATORY_CARE_PROVIDER_SITE_OTHER): Payer: Medicaid Other | Admitting: Pulmonary Disease

## 2012-07-13 ENCOUNTER — Encounter: Payer: Self-pay | Admitting: Pulmonary Disease

## 2012-07-13 ENCOUNTER — Other Ambulatory Visit: Payer: Medicaid Other

## 2012-07-13 VITALS — BP 110/68 | HR 102 | Temp 97.3°F | Ht 66.5 in | Wt 138.0 lb

## 2012-07-13 DIAGNOSIS — J449 Chronic obstructive pulmonary disease, unspecified: Secondary | ICD-10-CM

## 2012-07-13 DIAGNOSIS — J4489 Other specified chronic obstructive pulmonary disease: Secondary | ICD-10-CM

## 2012-07-13 NOTE — Assessment & Plan Note (Signed)
The patient overall is fairly stable, but I have explained to her that she needs to quit smoking and get back to pulmonary rehabilitation in order to improve her exertional tolerance.  I have asked her to continue on her current bronchodilator regimen, and we'll check a chest x-ray today to followup.  Will also check an alpha-1 antitrypsin level today since this has not been done.

## 2012-07-13 NOTE — Patient Instructions (Addendum)
No change in your breathing medications Will check cxr today, and also a blood test for the genetic form of emphysema that is inherited. Stop smoking.  This is the single most important factor for you to improve your quality of life. Get back to pulmonary rehab.  Let us know if we need to send another order to return.  followup with me in 6mos.

## 2012-07-13 NOTE — Progress Notes (Signed)
  Subjective:    Patient ID: [name], female    DOB: 19-Oct-1960, 52 y.o.   MRN: [id number]  HPI Patient comes in today for followup of her known COPD.  She denies having an acute exacerbation or true chest infection since last visit, but unfortunately has continued to smoke.  She feels that her breathing has at least stabilized, but she is motivated to try and work on improvement.  She wants to go back to pulmonary rehabilitation.  She denies any significant cough, mucus, or chest congestion currently.   Review of Systems  Constitutional: Negative for fever and unexpected weight change.  HENT: Positive for congestion. Negative for ear pain, nosebleeds, sore throat, rhinorrhea, sneezing, trouble swallowing, dental problem, postnasal drip and sinus pressure.        Allergies  Eyes: Negative for redness and itching.  Respiratory: Positive for cough, shortness of breath and wheezing. Negative for chest tightness.   Cardiovascular: Negative for palpitations and leg swelling.  Gastrointestinal: Negative for nausea and vomiting.  Genitourinary: Negative for dysuria.  Musculoskeletal: Negative for joint swelling.  Skin: Negative for rash.  Neurological: Negative for headaches.  Hematological: Does not bruise/bleed easily.  Psychiatric/Behavioral: Negative for dysphoric mood. The patient is not nervous/anxious.        Objective:   Physical Exam Thin female in no acute distress Nose without purulent discharge noted  neck without lymphadenopathy or thyromegaly Chest with decreased breath sounds, no active wheezing or rhonchi Cardiac exam regular rate and rhythm Lower extremities without edema, no cyanosis  alert and oriented, moves all 4 extremities.       Assessment & Plan:

## 2012-07-18 LAB — ALPHA-1 ANTITRYPSIN PHENOTYPE: A-1 Antitrypsin: 91 mg/dL (ref 83–199)

## 2012-07-19 ENCOUNTER — Ambulatory Visit: Payer: Medicaid Other | Attending: Anesthesiology | Admitting: Physical Therapy

## 2012-07-19 DIAGNOSIS — M545 Low back pain, unspecified: Secondary | ICD-10-CM | POA: Insufficient documentation

## 2012-07-19 DIAGNOSIS — M546 Pain in thoracic spine: Secondary | ICD-10-CM | POA: Insufficient documentation

## 2012-07-19 DIAGNOSIS — Z9981 Dependence on supplemental oxygen: Secondary | ICD-10-CM | POA: Insufficient documentation

## 2012-07-19 DIAGNOSIS — M542 Cervicalgia: Secondary | ICD-10-CM | POA: Insufficient documentation

## 2012-07-19 DIAGNOSIS — IMO0001 Reserved for inherently not codable concepts without codable children: Secondary | ICD-10-CM | POA: Insufficient documentation

## 2012-07-19 DIAGNOSIS — M4120 Other idiopathic scoliosis, site unspecified: Secondary | ICD-10-CM | POA: Insufficient documentation

## 2012-07-20 ENCOUNTER — Telehealth: Payer: Self-pay | Admitting: Pulmonary Disease

## 2012-07-20 NOTE — Telephone Encounter (Signed)
Spoke with patient, informed her of results as listed below per [facility] Patient verbalized understanding and nothing further needed at this time.

## 2012-07-20 NOTE — Telephone Encounter (Signed)
Notes Recorded by Barbaraann Share, MD on 07/18/2012 at 5:48 PM Please let pt know that bloodwork for the hereditary form of emphysema is normal   lmtcb x1

## 2012-07-20 NOTE — Progress Notes (Signed)
Quick Note:  Spoke with patient, informed her of results as listed below per [facility] Patient verbalized understanding and nothing further needed at this time. ______

## 2012-07-20 NOTE — Telephone Encounter (Signed)
Pt returned call. She says it's difficult to call back but will try to answer the phone when called back again. [name]

## 2012-07-27 ENCOUNTER — Telehealth: Payer: Self-pay | Admitting: Pulmonary Disease

## 2012-07-27 NOTE — Telephone Encounter (Signed)
Overall is safe to do the procedure, but there is always some increased risk in patients with copd.  I think she will do ok.

## 2012-07-27 NOTE — Telephone Encounter (Signed)
Spoke to pt. She states that she is going to [facility] for pain management. States that they are wanting to do a lumbar injection and she would be put to sleep. Wanting to know if this would be safe to do with her condition.  KC - please advise. Thanks.

## 2012-07-27 NOTE — Telephone Encounter (Signed)
Called and spoke with pt and she is aware of [facility] recs.  Nothing further is needed.  

## 2012-07-28 ENCOUNTER — Ambulatory Visit: Payer: Medicaid Other | Admitting: Physical Therapy

## 2012-08-02 ENCOUNTER — Other Ambulatory Visit: Payer: Self-pay | Admitting: Pulmonary Disease

## 2012-08-03 ENCOUNTER — Ambulatory Visit: Payer: Medicaid Other | Attending: Anesthesiology | Admitting: Physical Therapy

## 2012-08-03 DIAGNOSIS — M545 Low back pain, unspecified: Secondary | ICD-10-CM | POA: Insufficient documentation

## 2012-08-03 DIAGNOSIS — IMO0001 Reserved for inherently not codable concepts without codable children: Secondary | ICD-10-CM | POA: Insufficient documentation

## 2012-08-03 DIAGNOSIS — Z9981 Dependence on supplemental oxygen: Secondary | ICD-10-CM | POA: Insufficient documentation

## 2012-08-03 DIAGNOSIS — M4120 Other idiopathic scoliosis, site unspecified: Secondary | ICD-10-CM | POA: Insufficient documentation

## 2012-08-03 DIAGNOSIS — M542 Cervicalgia: Secondary | ICD-10-CM | POA: Insufficient documentation

## 2012-08-03 DIAGNOSIS — M546 Pain in thoracic spine: Secondary | ICD-10-CM | POA: Insufficient documentation

## 2012-08-11 ENCOUNTER — Ambulatory Visit: Payer: Medicaid Other | Admitting: Physical Therapy

## 2012-08-15 ENCOUNTER — Telehealth: Payer: Self-pay | Admitting: Pulmonary Disease

## 2012-08-15 NOTE — Telephone Encounter (Signed)
Per CY-okay to rake Nuvigil despite COPD.

## 2012-08-15 NOTE — Telephone Encounter (Signed)
Pt aware and will forward to Dr. Shelle Iron as an Lorain Childes

## 2012-08-15 NOTE — Telephone Encounter (Signed)
I spoke with [name]. She stated she was giving nuvigil 150 mg by another physician for ADHD. She stated since she has COPD she was told to make sure this was okay to take. She does not want to wait until [facility] comes back in the office next week for an answer. Please advise MR thanks

## 2012-08-15 NOTE — Telephone Encounter (Signed)
thiss questioin is best directed to a sleep/pulmonary doc in the office like Dr Maple Hudson, Dr Craige Cotta, Dr Vassie Loll who LIKELY will have more experience on this. I do not have experience to comment on this questions   Dr. Kalman Shan, M.D., [facility].C.P Pulmonary and Critical Care Medicine Staff Physician [facility] [place] [facility] Pager: [phone], If no answer or between  15:00h - 7:00h: call [phone]  08/15/2012 10:34 AM

## 2012-08-15 NOTE — Telephone Encounter (Signed)
Will forward to Dr. Maple Hudson. Please advise thanks

## 2012-09-09 ENCOUNTER — Other Ambulatory Visit: Payer: Self-pay | Admitting: *Deleted

## 2012-09-09 ENCOUNTER — Encounter: Payer: Self-pay | Admitting: *Deleted

## 2012-09-09 NOTE — Telephone Encounter (Signed)
error 

## 2012-10-13 ENCOUNTER — Inpatient Hospital Stay (HOSPITAL_COMMUNITY): Payer: Medicaid Other

## 2012-10-13 ENCOUNTER — Encounter (HOSPITAL_COMMUNITY): Payer: Self-pay | Admitting: Emergency Medicine

## 2012-10-13 ENCOUNTER — Inpatient Hospital Stay (HOSPITAL_COMMUNITY)
Admission: EM | Admit: 2012-10-13 | Discharge: 2012-10-14 | DRG: 069 | Disposition: A | Discharge status: Home / Self Care | Payer: Medicaid Other | Attending: Internal Medicine | Admitting: Internal Medicine

## 2012-10-13 ENCOUNTER — Emergency Department (HOSPITAL_COMMUNITY): Payer: Medicaid Other

## 2012-10-13 DIAGNOSIS — J449 Chronic obstructive pulmonary disease, unspecified: Secondary | ICD-10-CM

## 2012-10-13 DIAGNOSIS — R110 Nausea: Secondary | ICD-10-CM

## 2012-10-13 DIAGNOSIS — Z8673 Personal history of transient ischemic attack (TIA), and cerebral infarction without residual deficits: Secondary | ICD-10-CM

## 2012-10-13 DIAGNOSIS — K5909 Other constipation: Secondary | ICD-10-CM | POA: Diagnosis present

## 2012-10-13 DIAGNOSIS — R109 Unspecified abdominal pain: Secondary | ICD-10-CM

## 2012-10-13 DIAGNOSIS — F319 Bipolar disorder, unspecified: Secondary | ICD-10-CM | POA: Diagnosis present

## 2012-10-13 DIAGNOSIS — F1210 Cannabis abuse, uncomplicated: Secondary | ICD-10-CM | POA: Diagnosis present

## 2012-10-13 DIAGNOSIS — F17200 Nicotine dependence, unspecified, uncomplicated: Secondary | ICD-10-CM | POA: Diagnosis present

## 2012-10-13 DIAGNOSIS — G459 Transient cerebral ischemic attack, unspecified: Principal | ICD-10-CM | POA: Diagnosis present

## 2012-10-13 DIAGNOSIS — G8929 Other chronic pain: Secondary | ICD-10-CM | POA: Diagnosis present

## 2012-10-13 DIAGNOSIS — F411 Generalized anxiety disorder: Secondary | ICD-10-CM | POA: Diagnosis present

## 2012-10-13 DIAGNOSIS — R4789 Other speech disturbances: Secondary | ICD-10-CM | POA: Diagnosis present

## 2012-10-13 DIAGNOSIS — F4310 Post-traumatic stress disorder, unspecified: Secondary | ICD-10-CM | POA: Diagnosis present

## 2012-10-13 DIAGNOSIS — J4489 Other specified chronic obstructive pulmonary disease: Secondary | ICD-10-CM | POA: Diagnosis present

## 2012-10-13 DIAGNOSIS — R4781 Slurred speech: Secondary | ICD-10-CM

## 2012-10-13 DIAGNOSIS — I517 Cardiomegaly: Secondary | ICD-10-CM

## 2012-10-13 LAB — TROPONIN I
Troponin I: <0.3 ng/mL (ref <0.30)
Troponin I: <0.3 ng/mL (ref <0.30)
Troponin I: <0.3 ng/mL (ref <0.30)

## 2012-10-13 LAB — POCT I-STAT TROPONIN I: Troponin i, poc: 0 ng/mL (ref 0.00–0.08)

## 2012-10-13 LAB — URINALYSIS, ROUTINE W REFLEX MICROSCOPIC
Bilirubin Urine: NEGATIVE
Leukocytes, UA: NEGATIVE
Nitrite: NEGATIVE
Specific Gravity, Urine: 1.014 (ref 1.005–1.030)
pH: 6 (ref 5.0–8.0)

## 2012-10-13 LAB — COMPREHENSIVE METABOLIC PANEL
ALT: 13 U/L (ref 0–35)
Calcium: 10 mg/dL (ref 8.4–10.5)
Creatinine, Ser: 0.51 mg/dL (ref 0.50–1.10)
GFR calc Af Amer: >90 mL/min (ref >90)
Glucose, Bld: 115 mg/dL — ABNORMAL HIGH (ref 70–99)
Sodium: 132 mEq/L — ABNORMAL LOW (ref 135–145)
Total Protein: 7.7 g/dL (ref 6.0–8.3)

## 2012-10-13 LAB — CBC
Hemoglobin: 15.9 g/dL — ABNORMAL HIGH (ref 12.0–15.0)
MCH: 30.3 pg (ref 26.0–34.0)
MCHC: 34.3 g/dL (ref 30.0–36.0)

## 2012-10-13 LAB — RAPID URINE DRUG SCREEN, HOSP PERFORMED
Amphetamines: NOT DETECTED
Benzodiazepines: NOT DETECTED
Cocaine: NOT DETECTED
Tetrahydrocannabinol: POSITIVE — AB

## 2012-10-13 MED ORDER — DIAZEPAM 5 MG PO TABS
5.0000 mg | ORAL_TABLET | Freq: Two times a day (BID) | ORAL | Status: DC | PRN
  Administered 2012-10-13: 5 mg via ORAL
  Filled 2012-10-13 (×2): qty 1

## 2012-10-13 MED ORDER — ONDANSETRON 4 MG PO TBDP
4.0000 mg | ORAL_TABLET | Freq: Three times a day (TID) | ORAL | Status: DC | PRN
  Administered 2012-10-13: 4 mg via ORAL
  Filled 2012-10-13 (×2): qty 1

## 2012-10-13 MED ORDER — FLUTICASONE PROPIONATE 50 MCG/ACT NA SUSP
2.0000 | Freq: Every day | NASAL | Status: DC
  Administered 2012-10-13: 2 via NASAL
  Filled 2012-10-13: qty 16

## 2012-10-13 MED ORDER — PROMETHAZINE HCL 25 MG/ML IJ SOLN
12.5000 mg | Freq: Four times a day (QID) | INTRAMUSCULAR | Status: AC | PRN
  Administered 2012-10-14 (×2): 12.5 mg via INTRAVENOUS
  Filled 2012-10-13 (×2): qty 1

## 2012-10-13 MED ORDER — PANTOPRAZOLE SODIUM 40 MG PO TBEC
40.0000 mg | DELAYED_RELEASE_TABLET | Freq: Every day | ORAL | Status: DC
  Administered 2012-10-13 – 2012-10-14 (×2): 40 mg via ORAL
  Filled 2012-10-13 (×2): qty 1

## 2012-10-13 MED ORDER — LUBIPROSTONE 24 MCG PO CAPS
24.0000 ug | ORAL_CAPSULE | Freq: Every day | ORAL | Status: DC
  Administered 2012-10-14: 24 ug via ORAL
  Filled 2012-10-13 (×5): qty 1

## 2012-10-13 MED ORDER — HYDROMORPHONE HCL PF 1 MG/ML IJ SOLN
0.5000 mg | INTRAMUSCULAR | Status: DC | PRN
  Administered 2012-10-13: 0.5 mg via INTRAVENOUS
  Filled 2012-10-13: qty 1

## 2012-10-13 MED ORDER — TIOTROPIUM BROMIDE MONOHYDRATE 18 MCG IN CAPS
18.0000 ug | ORAL_CAPSULE | Freq: Every day | RESPIRATORY_TRACT | Status: DC
  Administered 2012-10-13 – 2012-10-14 (×2): 18 ug via RESPIRATORY_TRACT
  Filled 2012-10-13: qty 5

## 2012-10-13 MED ORDER — ASPIRIN 81 MG PO CHEW
324.0000 mg | CHEWABLE_TABLET | Freq: Once | ORAL | Status: AC
  Administered 2012-10-13: 324 mg via ORAL
  Filled 2012-10-13: qty 4

## 2012-10-13 MED ORDER — ASPIRIN 325 MG PO TABS
325.0000 mg | ORAL_TABLET | Freq: Every day | ORAL | Status: DC
  Administered 2012-10-14: 325 mg via ORAL
  Filled 2012-10-13: qty 1

## 2012-10-13 MED ORDER — HEPARIN SODIUM (PORCINE) 5000 UNIT/ML IJ SOLN
5000.0000 [IU] | Freq: Three times a day (TID) | INTRAMUSCULAR | Status: DC
  Administered 2012-10-13 – 2012-10-14 (×3): 5000 [IU] via SUBCUTANEOUS
  Filled 2012-10-13 (×11): qty 1

## 2012-10-13 MED ORDER — HYDROMORPHONE HCL PF 1 MG/ML IJ SOLN
1.0000 mg | INTRAMUSCULAR | Status: DC | PRN
  Administered 2012-10-13: 1 mg via INTRAVENOUS
  Filled 2012-10-13: qty 1

## 2012-10-13 MED ORDER — ALBUTEROL SULFATE HFA 108 (90 BASE) MCG/ACT IN AERS
2.0000 | INHALATION_SPRAY | Freq: Four times a day (QID) | RESPIRATORY_TRACT | Status: DC | PRN
  Administered 2012-10-13: 2 via RESPIRATORY_TRACT
  Filled 2012-10-13: qty 6.7

## 2012-10-13 MED ORDER — DIAZEPAM 5 MG PO TABS: 5.0000 mg | ORAL_TABLET | Freq: Two times a day (BID) | ORAL | Status: DC | PRN

## 2012-10-13 MED ORDER — OXYCODONE HCL 5 MG PO TABS: 5.0000 mg | ORAL_TABLET | ORAL | Status: DC | PRN

## 2012-10-13 MED ORDER — MOMETASONE FURO-FORMOTEROL FUM 100-5 MCG/ACT IN AERO
2.0000 | INHALATION_SPRAY | Freq: Two times a day (BID) | RESPIRATORY_TRACT | Status: DC
  Administered 2012-10-13 – 2012-10-14 (×3): 2 via RESPIRATORY_TRACT
  Filled 2012-10-13: qty 8.8

## 2012-10-13 MED ORDER — ONDANSETRON HCL 4 MG/2ML IJ SOLN
4.0000 mg | Freq: Once | INTRAMUSCULAR | Status: AC
Start: 2012-10-13 — End: 2012-10-13
  Administered 2012-10-13: 4 mg via INTRAVENOUS
  Filled 2012-10-13: qty 2

## 2012-10-13 MED ORDER — LORATADINE 10 MG PO TABS
10.0000 mg | ORAL_TABLET | Freq: Every day | ORAL | Status: DC
  Administered 2012-10-13 – 2012-10-14 (×2): 10 mg via ORAL
  Filled 2012-10-13 (×3): qty 1

## 2012-10-13 NOTE — Progress Notes (Signed)
Patient tearful while vascular tech was performing 2D Echo stating she was in severe pain in bilateral feet describing it as a "shooting, throbbing, cramping pain."  She is also complaining of abdominal pain on the right side and that she hasn't had much of an appetite in the past couple of weeks and has lost 12 pounds recently due to repeat nausea/vomiting.  MD made aware of patient's pain in feet and abdomen.  Told patient I would notify her once I heard from MD. Micah Flesher in patient's room to tell her he had adjusted her pain medication and patient was asleep. Will continue to monitor.

## 2012-10-13 NOTE — ED Provider Notes (Signed)
CSN: [id number]     Arrival date & time 10/13/12  0410 History     First MD Initiated Contact with Patient 10/13/12 0536     Chief Complaint  Patient presents with  . Chest Pain   (Consider location/radiation/quality/duration/timing/severity/associated sxs/prior Treatment) HPI Hx per PT - on the phone with her sister, developed slurred speech and some left leg heaviness. She is unable to say how long this lasted but believes it was anywhere from a minute to a few minutes. Her husband was home but not in the same room at the time.  Symptoms now improved. Patient has history of TIA about 2 years ago. She recently started on new medication, Cymbalta. He became very anxious with symptoms develop some slight chest pain that is now resolved. Symptoms moderate in severity.   Past Medical History  Diagnosis Date  . TIA (transient ischemic attack)   . GERD (gastroesophageal reflux disease)   . Hepatomegaly     hx  . Colonic inertia   . Lung abscess     "calcified over"; bilateral   . COPD (chronic obstructive pulmonary disease)   . Stroke 2009    TIA  . PTSD (post-traumatic stress disorder)   . Bipolar disorder   . Depression   . Chronic headache   . Seizures     in past  . Fibromyalgia   . Iron deficiency anemia   . Arthritis    Past Surgical History  Procedure Laterality Date  . 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 Cesarean section      x 2  . Esophagogastroduodenoscopy  12/23/2011    Procedure: ESOPHAGOGASTRODUODENOSCOPY (EGD);  Surgeon: Hart Carwin, MD;  Location: [facility];  Service: Endoscopy;  Laterality: N/A;   Family History  Problem Relation Age of Onset  . Emphysema Father   . Asthma Son   . Clotting disorder Mother   . Rheum arthritis Sister   . Rheum arthritis Brother   . Ovarian cancer Sister    History  Substance Use Topics  . Smoking status: Current Every Day Smoker -- 1.00 packs/day for 39 years  . Smokeless  tobacco: Never Used  . Alcohol Use: No   OB History   Grav Para Term Preterm Abortions TAB SAB Ect Mult Living                 Review of Systems  Constitutional: Negative for fever and chills.  HENT: Negative for neck pain and neck stiffness.   Eyes: Negative for pain.  Respiratory: Negative for shortness of breath.   Cardiovascular: Positive for chest pain.  Gastrointestinal: Negative for abdominal pain.  Genitourinary: Negative for dysuria.  Musculoskeletal: Negative for back pain.  Skin: Negative for rash.  Neurological: Positive for speech difficulty and weakness. Negative for headaches.  All other systems reviewed and are negative.    Allergies  Codeine; Hydrocodone; and Oxycodone-acetaminophen  Home Medications   Current Outpatient Rx  Name  Route  Sig  Dispense  Refill  . ADVAIR DISKUS 250-50 MCG/DOSE AEPB      inhale 1 dose by mouth twice a day   60 each   4   . albuterol (PROVENTIL) (2.5 MG/3ML) 0.083% nebulizer solution   Nebulization   Take 2.5 mg by nebulization every 6 (six) hours as needed. For shortness of breath         . albuterol (VENTOLIN HFA) 108 (90 BASE)  MCG/ACT inhaler   Inhalation   Inhale 2 puffs into the lungs every 6 (six) hours as needed.           . diazepam (VALIUM) 10 MG tablet   Oral   Take 10 mg by mouth 2 (two) times daily.         Marland Kitchen lactulose (CHRONULAC) 10 GM/15ML solution   Oral   Take 30 mLs (20 g total) by mouth 3 (three) times daily. Take 45 cc po every morning   500 mL   1   . loratadine (CLARITIN) 10 MG tablet   Oral   Take 10 mg by mouth daily.         Marland Kitchen lubiprostone (AMITIZA) 24 MCG capsule   Oral   Take 24 mcg by mouth daily with breakfast.         . mometasone (NASONEX) 50 MCG/ACT nasal spray   Nasal   Place 2 sprays into the nose 2 (two) times daily.          Marland Kitchen omeprazole (PRILOSEC) 20 MG capsule   Oral   Take 20 mg by mouth 2 (two) times daily.         . ondansetron (ZOFRAN) 4 MG  tablet   Oral   Take 1 tablet (4 mg total) by mouth every 8 (eight) hours as needed for nausea.   12 tablet   0   . oxyCODONE (OXYCONTIN) 10 MG 12 hr tablet   Oral   Take 10 mg by mouth every 12 (twelve) hours as needed. For pain         . Polyethylene Glycol 3350 (MIRALAX PO)   Oral   Take 1 packet by mouth daily as needed. For constipation         . tiotropium (SPIRIVA HANDIHALER) 18 MCG inhalation capsule   Inhalation   Place 1 capsule (18 mcg total) into inhaler and inhale daily.   30 capsule   6    BP 157/75  Pulse 109  Temp(Src) 97.9 F (36.6 C) (Oral)  Resp 18  Ht 5\' 7"  (1.702 m)  Wt 134 lb (60.782 kg)  BMI 20.98 kg/m2  SpO2 97% Physical Exam  Nursing note and vitals reviewed. Constitutional: She is oriented to person, place, and time. She appears well-developed and well-nourished.  HENT:  Head: Normocephalic and atraumatic.  Eyes: EOM are normal. Pupils are equal, round, and reactive to light.  Neck: Neck supple.  Cardiovascular: Normal heart sounds and intact distal pulses.   Pulmonary/Chest: Effort normal. No respiratory distress.  Musculoskeletal: Normal range of motion. She exhibits no edema.  Neurological: She is alert and oriented to person, place, and time.  Speech clear without unilateral deficits. Equal grips, biceps triceps. Equal hip flexion and dorsi plantar flexion. Sensorium to light touch equal and intact throughout. No facial droop. No pronator drift.  Skin: Skin is warm and dry.  Psychiatric:  Anxious with some pressured speech    ED Course   Procedures (including critical care time)  Results for orders placed during the hospital encounter of 10/13/12  CBC      Result Value Range   WBC 9.7  4.0 - 10.5 K/uL   RBC 5.24 (*) 3.87 - 5.11 MIL/uL   Hemoglobin 15.9 (*) 12.0 - 15.0 g/dL   HCT 16.1 (*) 09.6 - 04.5 %   MCV 88.5  78.0 - 100.0 fL   MCH 30.3  26.0 - 34.0 pg   MCHC 34.3  30.0 - 36.0  g/dL   RDW 16.1  09.6 - 04.5 %   Platelets  235  150 - 400 K/uL  COMPREHENSIVE METABOLIC PANEL      Result Value Range   Sodium 132 (*) 135 - 145 mEq/L   Potassium 3.8  3.5 - 5.1 mEq/L   Chloride 95 (*) 96 - 112 mEq/L   CO2 29  19 - 32 mEq/L   Glucose, Bld 115 (*) 70 - 99 mg/dL   BUN 9  6 - 23 mg/dL   Creatinine, Ser 4.09  0.50 - 1.10 mg/dL   Calcium 81.1  8.4 - 91.4 mg/dL   Total Protein 7.7  6.0 - 8.3 g/dL   Albumin 3.9  3.5 - 5.2 g/dL   AST 20  0 - 37 U/L   ALT 13  0 - 35 U/L   Alkaline Phosphatase 82  39 - 117 U/L   Total Bilirubin 0.3  0.3 - 1.2 mg/dL   GFR calc non Af Amer >90  >90 mL/min   GFR calc Af Amer >90  >90 mL/min  URINALYSIS, ROUTINE W REFLEX MICROSCOPIC      Result Value Range   Color, Urine YELLOW  YELLOW   APPearance CLEAR  CLEAR   Specific Gravity, Urine 1.014  1.005 - 1.030   pH 6.0  5.0 - 8.0   Glucose, UA NEGATIVE  NEGATIVE mg/dL   Hgb urine dipstick NEGATIVE  NEGATIVE   Bilirubin Urine NEGATIVE  NEGATIVE   Ketones, ur NEGATIVE  NEGATIVE mg/dL   Protein, ur NEGATIVE  NEGATIVE mg/dL   Urobilinogen, UA 0.2  0.0 - 1.0 mg/dL   Nitrite NEGATIVE  NEGATIVE   Leukocytes, UA NEGATIVE  NEGATIVE  POCT I-STAT TROPONIN I      Result Value Range   Troponin i, poc 0.00  0.00 - 0.08 ng/mL   Comment 3            Dg Chest 2 View  10/13/2012   *RADIOLOGY REPORT*  Clinical Data: Chest pain.  CHEST - 2 VIEW  Comparison: Chest x-ray 07/13/2012.  Findings: Lungs appear hyperexpanded with flattening of the hemidiaphragms, increased retrosternal air space and pruning of the pulmonary vasculature in the periphery, suggestive of underlying COPD.  No acute consolidative airspace disease.  No pleural effusions.  No evidence of pulmonary edema.  Calcified granuloma in the left upper lobe.  Spiculated nodular opacity in the right suprahilar region is unchanged compared to several prior examinations, favored to represent an area of post infectious or inflammatory scarring.  Heart size is normal.  Mediastinal contours are  unremarkable.  Prominent left-sided nipple shadow again noted. Old healed fracture of the posterolateral aspect of the left eleventh rib.  IMPRESSION: 1.  Chronic changes of COPD redemonstrated, as above, without radiographic evidence of acute cardiopulmonary disease.   Original Report Authenticated By: Trudie Reed, M.D.   Ct Head Wo Contrast  10/13/2012   *RADIOLOGY REPORT*  Clinical Data: Confusion and rambling speech.  Weakness.  CT HEAD WITHOUT CONTRAST  Technique:  Contiguous axial images were obtained from the base of the skull through the vertex without contrast.  Comparison: 05/02/2008.  Findings: No evidence of an acute infarct, acute hemorrhage, mass lesion, mass effect or hydrocephalus.  A focal area of low attenuation in the right basal ganglia is unchanged and may present a perivascular space or remote lacunar infarct.  Visualized portions of the paranasal sinuses and mastoid air cells are clear.  IMPRESSION: No acute intracranial abnormality.   Original  Report Authenticated By: Leanna Battles, M.D.     Date: 10/13/2012  Rate: 86  Rhythm: normal sinus rhythm  QRS Axis: normal  Intervals: normal  ST/T Wave abnormalities: nonspecific ST changes  Conduction Disutrbances:none  Narrative Interpretation:   Old EKG Reviewed: none available  7:42 AM MED consult, plan admit TIA work up  MDM  Slurred speech while talking to her sister on the phone prior to arrival. Symptoms resolved in a matter of minutes. Patient developed some anxiety symptoms and chest tightness that resolved. Normal neuro exam. EKG. Chest x-ray. Labs obtained and reviewed as above. Medical admission    Sunnie Nielsen, MD 10/13/12 [phone]

## 2012-10-13 NOTE — H&P (Signed)
[facility] History and Physical  [name]:[id number] DOB: 1960-07-08 DOA: 10/13/2012  Referring physician: Dr. Dierdre Highman PCP: Pcp Not In System  Specialists: none  Chief Complaint: slurred speech  HPI: [name] is a 52 y.o. female has a past medical history significant for TIA few years ago, COPD, PTSD, chronic pain, presents to ED with a chief complaint of slurred speech, self resolved that happened last night. She also complains of left leg heaviness at the same time. In the ED her symptoms have improved. Also endorses fleeting chest pain that she experienced this morning. Denies fever/chills, denies lightheadedness, she denies shortness of breath.   Review of Systems: as per HPI otherwise negative  Past Medical History  Diagnosis Date  . TIA (transient ischemic attack)   . GERD (gastroesophageal reflux disease)   . Hepatomegaly     hx  . Colonic inertia   . Lung abscess     "calcified over"; bilateral   . COPD (chronic obstructive pulmonary disease)   . Stroke 2009    TIA  . PTSD (post-traumatic stress disorder)   . Bipolar disorder   . Depression   . Chronic headache   . Seizures     in past  . Fibromyalgia   . Iron deficiency anemia   . Arthritis    Past Surgical History  Procedure Laterality Date  . 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 Cesarean section      x 2  . Esophagogastroduodenoscopy  12/23/2011    Procedure: ESOPHAGOGASTRODUODENOSCOPY (EGD);  Surgeon: Hart Carwin, MD;  Location: [facility];  Service: Endoscopy;  Laterality: N/A;   Social History:  reports that she has been smoking.  She has never used smokeless tobacco. She reports that she does not drink alcohol or use illicit drugs.  Allergies  Allergen Reactions  . Codeine Other (See Comments)    unknown  . Hydrocodone Other (See Comments)    unknown  . Oxycodone-Acetaminophen     Tylox (Oxycontin is on the PTA med list)    Family  History  Problem Relation Age of Onset  . Emphysema Father   . Asthma Son   . Clotting disorder Mother   . Rheum arthritis Sister   . Rheum arthritis Brother   . Ovarian cancer Sister    Prior to Admission medications   Medication Sig Start Date End Date Taking? Authorizing Provider  ADVAIR DISKUS 250-50 MCG/DOSE AEPB inhale 1 dose by mouth twice a day 08/02/12  Yes Barbaraann Share, MD  albuterol (PROVENTIL) (2.5 MG/3ML) 0.083% nebulizer solution Take 2.5 mg by nebulization every 6 (six) hours as needed. For shortness of breath   Yes Historical Provider, MD  albuterol (VENTOLIN HFA) 108 (90 BASE) MCG/ACT inhaler Inhale 2 puffs into the lungs every 6 (six) hours as needed.     Yes Historical Provider, MD  diazepam (VALIUM) 10 MG tablet Take 10 mg by mouth 2 (two) times daily.   Yes Historical Provider, MD  lactulose (CHRONULAC) 10 GM/15ML solution Take 30 mLs (20 g total) by mouth 3 (three) times daily. Take 45 cc po every morning 02/02/12  Yes Hart Carwin, MD  loratadine (CLARITIN) 10 MG tablet Take 10 mg by mouth daily.   Yes Historical Provider, MD  lubiprostone (AMITIZA) 24 MCG capsule Take 24 mcg by mouth daily with breakfast.   Yes Historical Provider, MD  mometasone (NASONEX) 50 MCG/ACT  nasal spray Place 2 sprays into the nose 2 (two) times daily.    Yes Historical Provider, MD  omeprazole (PRILOSEC) 20 MG capsule Take 20 mg by mouth 2 (two) times daily.   Yes Historical Provider, MD  ondansetron (ZOFRAN) 4 MG tablet Take 1 tablet (4 mg total) by mouth every 8 (eight) hours as needed for nausea. 02/18/12  Yes Charles B. Bernette Mayers, MD  oxyCODONE (OXYCONTIN) 10 MG 12 hr tablet Take 10 mg by mouth every 12 (twelve) hours as needed. For pain   Yes Historical Provider, MD  Polyethylene Glycol 3350 (MIRALAX PO) Take 1 packet by mouth daily as needed. For constipation   Yes Historical Provider, MD  tiotropium (SPIRIVA HANDIHALER) 18 MCG inhalation capsule Place 1 capsule (18 mcg total) into inhaler  and inhale daily. 03/23/12  Yes Barbaraann Share, MD   Physical Exam: Filed Vitals:   10/13/12 1324 10/13/12 0951 10/13/12 1153 10/13/12 1347  BP:  151/63  122/71  Pulse:  74  84  Temp:  97 F (36.1 C)  97.7 F (36.5 C)  TempSrc:  Oral  Axillary  Resp:  18  18  Height: 5\' 7"  (1.702 m)     Weight: 57.788 kg (127 lb 6.4 oz)     SpO2:  93% 92% 93%     General:  No apparent distress  Eyes: PERRL, EOMI, no scleral icterus  ENT: moist oropharynx  Neck: supple, no JVD  Cardiovascular: regular rate without MRG; 2+ peripheral pulses  Respiratory: CTA biL, good air movement without wheezing, rhonchi or crackled  Abdomen: soft, non tender to palpation, positive bowel sounds, no guarding, no rebound  Skin: no rashes  Musculoskeletal: no peripheral edema  Psychiatric: normal mood and affect  Neurologic: CN 2-12 grossly intact, MS 5/5 in all 4  Labs on Admission:  Basic Metabolic Panel:  Recent Labs Lab 10/13/12 0515  NA 132*  K 3.8  CL 95*  CO2 29  GLUCOSE 115*  BUN 9  CREATININE 0.51  CALCIUM 10.0   Liver Function Tests:  Recent Labs Lab 10/13/12 0515  AST 20  ALT 13  ALKPHOS 82  BILITOT 0.3  PROT 7.7  ALBUMIN 3.9   CBC:  Recent Labs Lab 10/13/12 0515  WBC 9.7  HGB 15.9*  HCT 46.4*  MCV 88.5  PLT 235   Cardiac Enzymes:  Recent Labs Lab 10/13/12 0905  TROPONINI <0.30    Radiological Exams on Admission: Dg Chest 2 View  10/13/2012   *RADIOLOGY REPORT*  Clinical Data: Chest pain.  CHEST - 2 VIEW  Comparison: Chest x-ray 07/13/2012.  Findings: Lungs appear hyperexpanded with flattening of the hemidiaphragms, increased retrosternal air space and pruning of the pulmonary vasculature in the periphery, suggestive of underlying COPD.  No acute consolidative airspace disease.  No pleural effusions.  No evidence of pulmonary edema.  Calcified granuloma in the left upper lobe.  Spiculated nodular opacity in the right suprahilar region is unchanged compared  to several prior examinations, favored to represent an area of post infectious or inflammatory scarring.  Heart size is normal.  Mediastinal contours are unremarkable.  Prominent left-sided nipple shadow again noted. Old healed fracture of the posterolateral aspect of the left eleventh rib.  IMPRESSION: 1.  Chronic changes of COPD redemonstrated, as above, without radiographic evidence of acute cardiopulmonary disease.   Original Report Authenticated By: Trudie Reed, M.D.   Ct Head Wo Contrast  10/13/2012   *RADIOLOGY REPORT*  Clinical Data: Confusion and rambling speech.  Weakness.  CT HEAD WITHOUT CONTRAST  Technique:  Contiguous axial images were obtained from the base of the skull through the vertex without contrast.  Comparison: 05/02/2008.  Findings: No evidence of an acute infarct, acute hemorrhage, mass lesion, mass effect or hydrocephalus.  A focal area of low attenuation in the right basal ganglia is unchanged and may present a perivascular space or remote lacunar infarct.  Visualized portions of the paranasal sinuses and mastoid air cells are clear.  IMPRESSION: No acute intracranial abnormality.   Original Report Authenticated By: Leanna Battles, M.D.   Mr [facility] Wo Contrast  10/13/2012   *RADIOLOGY REPORT*  Clinical Data:  Confusion. Possible TIA.  Slurred speech.  MRI HEAD WITHOUT CONTRAST MRA HEAD WITHOUT CONTRAST  Technique:  Multiplanar, multiecho pulse sequences of the brain and surrounding structures were obtained without intravenous contrast. Angiographic images of the head were obtained using MRA technique without contrast.  Comparison:  CT head 10/13/2012  MRI HEAD  Findings:  There is no evidence for acute infarction, intracranial hemorrhage, mass lesion, hydrocephalus, or extra-axial fluid. There is no atrophy or white matter disease.  No foci of chronic hemorrhage.  No midline shift.  Flow voids are maintained throughout intracranial vasculature.  Negative osseous structures. No  sinus or mastoid disease.  Good agreement prior CT.  IMPRESSION: Negative exam.  MRA HEAD  Findings: Widely patent internal carotid arteries and basilar artery.  Both vertebrals contribute to formation of the basilar with the right dominant.  No intracranial stenosis or visible aneurysm.  IMPRESSION: Negative exam.   Original Report Authenticated By: Davonna Belling, M.D.   Mri Brain Without Contrast  10/13/2012   *RADIOLOGY REPORT*  Clinical Data:  Confusion. Possible TIA.  Slurred speech.  MRI HEAD WITHOUT CONTRAST MRA HEAD WITHOUT CONTRAST  Technique:  Multiplanar, multiecho pulse sequences of the brain and surrounding structures were obtained without intravenous contrast. Angiographic images of the head were obtained using MRA technique without contrast.  Comparison:  CT head 10/13/2012  MRI HEAD  Findings:  There is no evidence for acute infarction, intracranial hemorrhage, mass lesion, hydrocephalus, or extra-axial fluid. There is no atrophy or white matter disease.  No foci of chronic hemorrhage.  No midline shift.  Flow voids are maintained throughout intracranial vasculature.  Negative osseous structures. No sinus or mastoid disease.  Good agreement prior CT.  IMPRESSION: Negative exam.  MRA HEAD  Findings: Widely patent internal carotid arteries and basilar artery.  Both vertebrals contribute to formation of the basilar with the right dominant.  No intracranial stenosis or visible aneurysm.  IMPRESSION: Negative exam.   Original Report Authenticated By: Davonna Belling, M.D.    EKG: Independently reviewed.  Assessment/Plan Active Problems:   TIA   C O P D  Patient admitted with TIA symptoms, now resolved. Will pursue workup including MRI/MRA, carotid dopplers, TTE. Telemetry. Cycle CE given fleeting chest pain. UDS positive for marijuana.  Code Status: Presumed full  Family Communication: none  Disposition Plan: home when medically ready, hopefully 1 days  Time spent: 44  Aneesa Romey M. Elvera Lennox,  MD [facility] Pager [phone]  If 7PM-7AM, please contact night-coverage [email or web address] Password [facility] 10/13/2012, 2:37 PM

## 2012-10-13 NOTE — Progress Notes (Signed)
Called for report at 0800 and waited on hold for 4 minutes. Told by NT to call back in a couple minutes because the RN was in a patient's room.

## 2012-10-13 NOTE — ED Notes (Signed)
Report given to 4 th floor 

## 2012-10-13 NOTE — Progress Notes (Signed)
Received report at [phone] from ED, patient admitted with CP/TIA? Asked RN to do bedside swallow since patient is being admitted to unit NPO. RN stated no order for bedside swallow. Will page admitting MD once arrival to unit.

## 2012-10-13 NOTE — Progress Notes (Signed)
Called ED to check on status of patient coming up to 4th floor. NT stated per RN that MD came to bedside and ordered a stroke workup that RN needed to perform.  Pt. Will be up after this is done.

## 2012-10-13 NOTE — Progress Notes (Signed)
*  PRELIMINARY RESULTS* Vascular Ultrasound Carotid Duplex (Doppler) has been completed.  Preliminary findings: Bilateral:  1-39% ICA stenosis.  Vertebral artery flow is antegrade.      Farrel Demark, RDMS, RVT  10/13/2012, 10:14 AM

## 2012-10-13 NOTE — ED Notes (Signed)
Pt c/o midsternal chest pain onset this evening (unsure of time) radiating to L arm and leg, describes as heaviness. Pt appears slightly confused with rambling speech.

## 2012-10-13 NOTE — Progress Notes (Signed)
  Echocardiogram 2D Echocardiogram has been performed.  Arvil Chaco 10/13/2012, 5:12 PM

## 2012-10-14 DIAGNOSIS — R4789 Other speech disturbances: Secondary | ICD-10-CM

## 2012-10-14 DIAGNOSIS — R110 Nausea: Secondary | ICD-10-CM

## 2012-10-14 DIAGNOSIS — G8929 Other chronic pain: Secondary | ICD-10-CM

## 2012-10-14 DIAGNOSIS — R109 Unspecified abdominal pain: Secondary | ICD-10-CM

## 2012-10-14 DIAGNOSIS — K5900 Constipation, unspecified: Secondary | ICD-10-CM

## 2012-10-14 DIAGNOSIS — K5909 Other constipation: Secondary | ICD-10-CM | POA: Diagnosis present

## 2012-10-14 LAB — BASIC METABOLIC PANEL
CO2: 33 mEq/L — ABNORMAL HIGH (ref 19–32)
Chloride: 96 mEq/L (ref 96–112)
Glucose, Bld: 105 mg/dL — ABNORMAL HIGH (ref 70–99)
Sodium: 135 mEq/L (ref 135–145)

## 2012-10-14 LAB — CBC
Hemoglobin: 14.9 g/dL (ref 12.0–15.0)
MCV: 91 fL (ref 78.0–100.0)
Platelets: 243 10*3/uL (ref 150–400)
RBC: 4.98 MIL/uL (ref 3.87–5.11)
WBC: 7.6 10*3/uL (ref 4.0–10.5)

## 2012-10-14 LAB — LIPID PANEL
Cholesterol: 166 mg/dL (ref 0–200)
Total CHOL/HDL Ratio: 2.9 RATIO

## 2012-10-14 MED ORDER — OXYCODONE HCL 5 MG PO TABS
5.0000 mg | ORAL_TABLET | Freq: Once | ORAL | Status: AC
  Administered 2012-10-14: 5 mg via ORAL
  Filled 2012-10-14: qty 1

## 2012-10-14 MED ORDER — ASPIRIN EC 81 MG PO TBEC: 81.0000 mg | DELAYED_RELEASE_TABLET | Freq: Every day | ORAL | Status: DC

## 2012-10-14 MED ORDER — OXYCODONE HCL 10 MG PO TB12: 10.0000 mg | ORAL_TABLET | Freq: Two times a day (BID) | ORAL | Status: DC | PRN

## 2012-10-14 MED ORDER — LACTULOSE 10 GM/15ML PO SOLN: 30.0000 mL | Freq: Three times a day (TID) | ORAL | Status: DC

## 2012-10-14 MED ORDER — DIAZEPAM 5 MG PO TABS: 10.0000 mg | ORAL_TABLET | Freq: Two times a day (BID) | ORAL | Status: DC | PRN

## 2012-10-14 MED ORDER — DIAZEPAM 10 MG PO TABS: 10.0000 mg | ORAL_TABLET | Freq: Two times a day (BID) | ORAL | Status: DC | PRN

## 2012-10-14 MED ORDER — POLYETHYLENE GLYCOL 3350 17 GM/SCOOP PO POWD: 17.0000 g | Freq: Every day | ORAL | Status: DC | PRN

## 2012-10-14 MED ORDER — CYCLOBENZAPRINE HCL 5 MG PO TABS
5.0000 mg | ORAL_TABLET | Freq: Once | ORAL | Status: AC
  Administered 2012-10-14: 5 mg via ORAL
  Filled 2012-10-14: qty 1

## 2012-10-14 MED ORDER — CYCLOBENZAPRINE HCL 5 MG PO TABS: 5.0000 mg | ORAL_TABLET | Freq: Three times a day (TID) | ORAL | Status: DC | PRN

## 2012-10-14 MED ORDER — PROMETHAZINE HCL 12.5 MG PO TABS: 12.5000 mg | ORAL_TABLET | Freq: Four times a day (QID) | ORAL | Status: DC | PRN

## 2012-10-14 NOTE — Progress Notes (Signed)
Patient is alert and oriented x 4. Patient refusing bed alarm, said to NT, "Can you show me how to turn off the bed alarm so I can use the bathroom?" NT declined showing patient how to turn off bed alarm and both NT and RN educated patient on fall prevention safety and the importance of the bed alarm. A few minutes later, the patient gets up and the bed alarm went off. Before RN or NT could reach the room, the patient had turned the bed alarm off herself. Patient now up in chair with chair alarm placed. Patient is continuing to get up frequently, despite the alarms and further education. Will continue to monitor and check frequently for safety. Leeroy Cha

## 2012-10-14 NOTE — Discharge Summary (Signed)
Physician Discharge Summary  [name] BJY:[id number] DOB: Jul 13, 1960 DOA: 10/13/2012  PCP: Kaleen Mask, MD  Admit date: 10/13/2012 Discharge date: 10/14/2012  Time spent: 45 minutes  Recommendations for Outpatient Follow-up:  1. Follow up with PCP in a week 2. Follow up with [facility] as previously scheduled for chronic abdominal pain, chronic nausea and vomiting and chronic constipation   Discharge Diagnoses:  Active Problems:   TIA   C O P D   Chronic abdominal pain   Chronic constipation   Chronic nausea  Discharge Condition: stable  Diet recommendation: heart healthy  Filed Weights   10/13/12 0415 10/13/12 0943  Weight: 60.782 kg (134 lb) 57.788 kg (127 lb 6.4 oz)   History of present illness:  [name] is a 52 y.o. female has a past medical history significant for TIA few years ago, COPD, PTSD, chronic pain, presents to ED with a chief complaint of slurred speech, self resolved that happened last night. She also complains of left leg heaviness at the same time. In the ED her symptoms have improved. Also endorses fleeting chest pain that she experienced this morning. Denies fever/chills, denies lightheadedness, she denies shortness of breath.   Hospital Course:  Slurred speech - self resolved episode at home. Patient reports that she has had episode like this with her previous TIA. She was admitted to telemetry and underwent MRI which was negative for evidence of ischemia. 2D echo showed normal systolic and diastolic function, carotid dopplers were done as well (results below). Patient was started on Aspirin during this hospitalization. She was advised to follow up with her PCP in a week following discharge.   Drug abuse - UDS positive for marijuana, and per patient she only had "2 puffs" to help with her nausea and she does not commonly uses marijuana. I did however noticed that she is on multiple medications for pain, anxiety and nausea which can make her drowsy  including Oxycodone and Valium, and on 8/15 am as I was talking to Mrs. Pavlov after she received iv phenergan for nausea she seemed drowsy and sleepy. I have had extensive discussion with the patient regarding her medication regimen, the fact that her pain medication, and anxiety medication, nausea medication, they can all cause drowsiness and impaired level of consciousness. While she may have indications for each one of them, I urged her to take them separately and never combined as it may be dangerous. She expressed understanding. Chronic abdominal pain with nausea and chronic constipation - she had previous extensive workup as evidenced by gastroenterology visits as an outpatient, she is currently followed at [facility], and she has an appointment in about 10 days for additional testing.  Procedures:  2D echo Study Conclusions Left ventricle: The cavity size was normal. Wall thickness was increased in a pattern of mild LVH. Systolic function was normal. The estimated ejection fraction was in the range of 50% to 55%. Left ventricular diastolic function parameters were normal.   Carotid doppler  Summary: - The vertebral arteries appear patent with antegrade flow. Findings consistent with less than 39 percent stenosis involving the right internal carotid artery and the left internal carotid artery.  Consultations:  none  Discharge Exam: Filed Vitals:   10/13/12 2013 10/13/12 2100 10/14/12 0026 10/14/12 0504  BP:  111/53 112/71 91/75  Pulse:  98 89 86  Temp:  98.2 F (36.8 C) 97.7 F (36.5 C) 97.5 F (36.4 C)  TempSrc:  Axillary Axillary Axillary  Resp:  18  18 16  Height:      Weight:      SpO2: 89% 93% 96% 97%   General: NAD Cardiovascular: RRR Respiratory: CTA biL  Discharge Instructions   Future Appointments Provider Department Dept Phone   01/10/2013 3:30 PM Barbaraann Share, MD [facility] [phone]       Medication List         ADVAIR DISKUS 250-50 MCG/DOSE  Aepb  Generic drug:  Fluticasone-Salmeterol  inhale 1 dose by mouth twice a day     aspirin EC 81 MG tablet  Take 1 tablet (81 mg total) by mouth daily.     cyclobenzaprine 5 MG tablet  Commonly known as:  FLEXERIL  Take 1 tablet (5 mg total) by mouth 3 (three) times daily as needed for muscle spasms.     diazepam 10 MG tablet  Commonly known as:  VALIUM  Take 1 tablet (10 mg total) by mouth every 12 (twelve) hours as needed for anxiety.     lactulose 10 GM/15ML solution  Commonly known as:  CHRONULAC  Take 30 mL (20 g total) by mouth 3 (three) times daily. Take 45 cc po every morning     loratadine 10 MG tablet  Commonly known as:  CLARITIN  Take 10 mg by mouth daily.     lubiprostone 24 MCG capsule  Commonly known as:  AMITIZA  Take 24 mcg by mouth daily with breakfast.     mometasone 50 MCG/ACT nasal spray  Commonly known as:  NASONEX  Place 2 sprays into the nose 2 (two) times daily.     omeprazole 20 MG capsule  Commonly known as:  PRILOSEC  Take 20 mg by mouth 2 (two) times daily.     ondansetron 4 MG tablet  Commonly known as:  ZOFRAN  Take 1 tablet (4 mg total) by mouth every 8 (eight) hours as needed for nausea.     oxyCODONE 10 MG 12 hr tablet  Commonly known as:  OXYCONTIN  Take 1 tablet (10 mg total) by mouth every 12 (twelve) hours as needed. For pain     polyethylene glycol powder powder  Commonly known as:  MIRALAX  Take 17 g by mouth daily as needed.     tiotropium 18 MCG inhalation capsule  Commonly known as:  SPIRIVA HANDIHALER  Place 1 capsule (18 mcg total) into inhaler and inhale daily.     VENTOLIN HFA 108 (90 BASE) MCG/ACT inhaler  Generic drug:  albuterol  Inhale 2 puffs into the lungs every 6 (six) hours as needed.     albuterol (2.5 MG/3ML) 0.083% nebulizer solution  Commonly known as:  PROVENTIL  Take 2.5 mg by nebulization every 6 (six) hours as needed. For shortness of breath       The results of significant diagnostics from  this hospitalization (including imaging, microbiology, ancillary and laboratory) are listed below for reference.    Significant Diagnostic Studies: Dg Chest 2 View  10/13/2012   *RADIOLOGY REPORT*  Clinical Data: Chest pain.  CHEST - 2 VIEW  Comparison: Chest x-ray 07/13/2012.  Findings: Lungs appear hyperexpanded with flattening of the hemidiaphragms, increased retrosternal air space and pruning of the pulmonary vasculature in the periphery, suggestive of underlying COPD.  No acute consolidative airspace disease.  No pleural effusions.  No evidence of pulmonary edema.  Calcified granuloma in the left upper lobe.  Spiculated nodular opacity in the right suprahilar region is unchanged compared to several prior examinations, favored to  represent an area of post infectious or inflammatory scarring.  Heart size is normal.  Mediastinal contours are unremarkable.  Prominent left-sided nipple shadow again noted. Old healed fracture of the posterolateral aspect of the left eleventh rib.  IMPRESSION: 1.  Chronic changes of COPD redemonstrated, as above, without radiographic evidence of acute cardiopulmonary disease.   Original Report Authenticated By: Trudie Reed, M.D.   Ct Head Wo Contrast  10/13/2012   *RADIOLOGY REPORT*  Clinical Data: Confusion and rambling speech.  Weakness.  CT HEAD WITHOUT CONTRAST  Technique:  Contiguous axial images were obtained from the base of the skull through the vertex without contrast.  Comparison: 05/02/2008.  Findings: No evidence of an acute infarct, acute hemorrhage, mass lesion, mass effect or hydrocephalus.  A focal area of low attenuation in the right basal ganglia is unchanged and may present a perivascular space or remote lacunar infarct.  Visualized portions of the paranasal sinuses and mastoid air cells are clear.  IMPRESSION: No acute intracranial abnormality.   Original Report Authenticated By: Leanna Battles, M.D.   Mr [facility] Wo Contrast  10/13/2012   *RADIOLOGY  REPORT*  Clinical Data:  Confusion. Possible TIA.  Slurred speech.  MRI HEAD WITHOUT CONTRAST MRA HEAD WITHOUT CONTRAST  Technique:  Multiplanar, multiecho pulse sequences of the brain and surrounding structures were obtained without intravenous contrast. Angiographic images of the head were obtained using MRA technique without contrast.  Comparison:  CT head 10/13/2012  MRI HEAD  Findings:  There is no evidence for acute infarction, intracranial hemorrhage, mass lesion, hydrocephalus, or extra-axial fluid. There is no atrophy or white matter disease.  No foci of chronic hemorrhage.  No midline shift.  Flow voids are maintained throughout intracranial vasculature.  Negative osseous structures. No sinus or mastoid disease.  Good agreement prior CT.  IMPRESSION: Negative exam.  MRA HEAD  Findings: Widely patent internal carotid arteries and basilar artery.  Both vertebrals contribute to formation of the basilar with the right dominant.  No intracranial stenosis or visible aneurysm.  IMPRESSION: Negative exam.   Original Report Authenticated By: Davonna Belling, M.D.   Mri Brain Without Contrast  10/13/2012   *RADIOLOGY REPORT*  Clinical Data:  Confusion. Possible TIA.  Slurred speech.  MRI HEAD WITHOUT CONTRAST MRA HEAD WITHOUT CONTRAST  Technique:  Multiplanar, multiecho pulse sequences of the brain and surrounding structures were obtained without intravenous contrast. Angiographic images of the head were obtained using MRA technique without contrast.  Comparison:  CT head 10/13/2012  MRI HEAD  Findings:  There is no evidence for acute infarction, intracranial hemorrhage, mass lesion, hydrocephalus, or extra-axial fluid. There is no atrophy or white matter disease.  No foci of chronic hemorrhage.  No midline shift.  Flow voids are maintained throughout intracranial vasculature.  Negative osseous structures. No sinus or mastoid disease.  Good agreement prior CT.  IMPRESSION: Negative exam.  MRA HEAD  Findings: Widely  patent internal carotid arteries and basilar artery.  Both vertebrals contribute to formation of the basilar with the right dominant.  No intracranial stenosis or visible aneurysm.  IMPRESSION: Negative exam.   Original Report Authenticated By: Davonna Belling, M.D.   Labs: Basic Metabolic Panel:  Recent Labs Lab 10/13/12 0515 10/14/12 0500  NA 132* 135  K 3.8 3.9  CL 95* 96  CO2 29 33*  GLUCOSE 115* 105*  BUN 9 7  CREATININE 0.51 0.61  CALCIUM 10.0 10.2   Liver Function Tests:  Recent Labs Lab 10/13/12 0515  AST 20  ALT 13  ALKPHOS 82  BILITOT 0.3  PROT 7.7  ALBUMIN 3.9   CBC:  Recent Labs Lab 10/13/12 0515 10/14/12 0500  WBC 9.7 7.6  HGB 15.9* 14.9  HCT 46.4* 45.3  MCV 88.5 91.0  PLT 235 243   Cardiac Enzymes:  Recent Labs Lab 10/13/12 0905 10/13/12 1445 10/13/12 2053  TROPONINI <0.30 <0.30 <0.30    Signed:  Pamella Pert  [facility] 10/14/2012, 9:56 AM

## 2012-10-29 ENCOUNTER — Inpatient Hospital Stay (HOSPITAL_COMMUNITY)
Admission: EM | Admit: 2012-10-29 | Discharge: 2012-11-02 | DRG: 393 | Disposition: A | Discharge status: Home / Self Care | Payer: Medicaid Other | Attending: Internal Medicine | Admitting: Internal Medicine

## 2012-10-29 ENCOUNTER — Emergency Department (HOSPITAL_COMMUNITY): Payer: Medicaid Other

## 2012-10-29 ENCOUNTER — Encounter (HOSPITAL_COMMUNITY): Payer: Self-pay | Admitting: Emergency Medicine

## 2012-10-29 DIAGNOSIS — K921 Melena: Secondary | ICD-10-CM

## 2012-10-29 DIAGNOSIS — F319 Bipolar disorder, unspecified: Secondary | ICD-10-CM

## 2012-10-29 DIAGNOSIS — K529 Noninfective gastroenteritis and colitis, unspecified: Secondary | ICD-10-CM

## 2012-10-29 DIAGNOSIS — R1310 Dysphagia, unspecified: Secondary | ICD-10-CM

## 2012-10-29 DIAGNOSIS — E43 Unspecified severe protein-calorie malnutrition: Secondary | ICD-10-CM

## 2012-10-29 DIAGNOSIS — F17200 Nicotine dependence, unspecified, uncomplicated: Secondary | ICD-10-CM | POA: Diagnosis present

## 2012-10-29 DIAGNOSIS — R1013 Epigastric pain: Secondary | ICD-10-CM

## 2012-10-29 DIAGNOSIS — F4310 Post-traumatic stress disorder, unspecified: Secondary | ICD-10-CM

## 2012-10-29 DIAGNOSIS — R110 Nausea: Secondary | ICD-10-CM

## 2012-10-29 DIAGNOSIS — G8929 Other chronic pain: Secondary | ICD-10-CM

## 2012-10-29 DIAGNOSIS — G459 Transient cerebral ischemic attack, unspecified: Secondary | ICD-10-CM

## 2012-10-29 DIAGNOSIS — R930 Abnormal findings on diagnostic imaging of skull and head, not elsewhere classified: Secondary | ICD-10-CM

## 2012-10-29 DIAGNOSIS — Z8719 Personal history of other diseases of the digestive system: Secondary | ICD-10-CM

## 2012-10-29 DIAGNOSIS — J449 Chronic obstructive pulmonary disease, unspecified: Secondary | ICD-10-CM

## 2012-10-29 DIAGNOSIS — K625 Hemorrhage of anus and rectum: Secondary | ICD-10-CM

## 2012-10-29 DIAGNOSIS — R112 Nausea with vomiting, unspecified: Secondary | ICD-10-CM

## 2012-10-29 DIAGNOSIS — R51 Headache: Secondary | ICD-10-CM

## 2012-10-29 DIAGNOSIS — K5900 Constipation, unspecified: Secondary | ICD-10-CM | POA: Diagnosis present

## 2012-10-29 DIAGNOSIS — F411 Generalized anxiety disorder: Secondary | ICD-10-CM | POA: Diagnosis present

## 2012-10-29 DIAGNOSIS — K55059 Acute (reversible) ischemia of intestine, part and extent unspecified: Principal | ICD-10-CM | POA: Diagnosis present

## 2012-10-29 DIAGNOSIS — R109 Unspecified abdominal pain: Secondary | ICD-10-CM

## 2012-10-29 DIAGNOSIS — K219 Gastro-esophageal reflux disease without esophagitis: Secondary | ICD-10-CM

## 2012-10-29 DIAGNOSIS — Z8679 Personal history of other diseases of the circulatory system: Secondary | ICD-10-CM

## 2012-10-29 DIAGNOSIS — Z8673 Personal history of transient ischemic attack (TIA), and cerebral infarction without residual deficits: Secondary | ICD-10-CM

## 2012-10-29 DIAGNOSIS — D509 Iron deficiency anemia, unspecified: Secondary | ICD-10-CM

## 2012-10-29 DIAGNOSIS — J4489 Other specified chronic obstructive pulmonary disease: Secondary | ICD-10-CM

## 2012-10-29 DIAGNOSIS — K5909 Other constipation: Secondary | ICD-10-CM

## 2012-10-29 DIAGNOSIS — K55039 Acute (reversible) ischemia of large intestine, extent unspecified: Secondary | ICD-10-CM

## 2012-10-29 HISTORY — DX: Acute (reversible) ischemia of large intestine, extent unspecified: K55.039

## 2012-10-29 LAB — CBC WITH DIFFERENTIAL/PLATELET
Eosinophils Absolute: 0.2 10*3/uL (ref 0.0–0.7)
Eosinophils Relative: 1 % (ref 0–5)
HCT: 48 % — ABNORMAL HIGH (ref 36.0–46.0)
Lymphocytes Relative: 31 % (ref 12–46)
Lymphs Abs: 4 10*3/uL (ref 0.7–4.0)
MCH: 30.1 pg (ref 26.0–34.0)
MCV: 89.9 fL (ref 78.0–100.0)
Monocytes Absolute: 0.9 10*3/uL (ref 0.1–1.0)
Monocytes Relative: 7 % (ref 3–12)
RBC: 5.34 MIL/uL — ABNORMAL HIGH (ref 3.87–5.11)
WBC: 13.1 10*3/uL — ABNORMAL HIGH (ref 4.0–10.5)

## 2012-10-29 MED ORDER — IOHEXOL 300 MG/ML  SOLN
50.0000 mL | Freq: Once | INTRAMUSCULAR | Status: AC | PRN
  Administered 2012-10-29: 50 mL via ORAL

## 2012-10-29 NOTE — ED Notes (Signed)
Pt has had abdominal pain for the last month. Pt has been seen by doctor for abdominal pain.  Pt was told by doctor that she needed to come to ED after reporting rectal bleeding.  Pt complains of right lower abdominal pain.  Pt reports 16#'s in the last month

## 2012-10-29 NOTE — ED Provider Notes (Signed)
CSN: [id number]     Arrival date & time 10/29/12  2247 History   First MD Initiated Contact with Patient 10/29/12 2308     Chief Complaint  Patient presents with  . Rectal Pain  . Abdominal Pain   (Consider location/radiation/quality/duration/timing/severity/associated sxs/prior Treatment) HPI 52 yo female presents to the ER from home with complaint of worsening right sided abdominal pain and passing blood clots in stool.  Pt reports h/o chronic constipation, right sided abd pain for over a year.  She is followed by GI at [facility].  She was admitted 2 weeks ago for stroke like symptoms, thought to be due to her celexa.  She reports 16 lb weight loss and chronic nausea over the last month.  Her pcm has been working up these complaints, but has been put on hold due to recent admission.  Pt woke in the middle of the night with severe sharp right lower abd pain.  It resolved some, but has been persistent through the day.  Pt with 5-8 episodes of blood per rectum today, sometimes mixed with stool but other times just dark blood clots.  No prior h/o same.  Colonoscopy about 3 years ago.  No history of diverticulitis.  She reports fever to 101.9, took tylenol prior to arrival  Past Medical History  Diagnosis Date  . TIA (transient ischemic attack)   . GERD (gastroesophageal reflux disease)   . Hepatomegaly     hx  . Colonic inertia   . Lung abscess     "calcified over"; bilateral   . COPD (chronic obstructive pulmonary disease)   . Stroke 2009    TIA  . PTSD (post-traumatic stress disorder)   . Bipolar disorder   . Depression   . Chronic headache   . Seizures     in past  . Fibromyalgia   . Iron deficiency anemia   . Arthritis    Past Surgical History  Procedure Laterality Date  . 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 Cesarean section      x 2  . Esophagogastroduodenoscopy  12/23/2011    Procedure: ESOPHAGOGASTRODUODENOSCOPY (EGD);  Surgeon: Hart Carwin, MD;  Location: [facility];  Service: Endoscopy;  Laterality: N/A;   Family History  Problem Relation Age of Onset  . Emphysema Father   . Asthma Son   . Clotting disorder Mother   . Rheum arthritis Sister   . Rheum arthritis Brother   . Ovarian cancer Sister    History  Substance Use Topics  . Smoking status: Current Every Day Smoker -- 1.00 packs/day for 39 years  . Smokeless tobacco: Never Used  . Alcohol Use: No   OB History   Grav Para Term Preterm Abortions TAB SAB Ect Mult Living                 Review of Systems  All other systems reviewed and are negative.    Allergies  Oxycodone-acetaminophen; Codeine; and Hydrocodone  Home Medications   Current Outpatient Rx  Name  Route  Sig  Dispense  Refill  . ADVAIR DISKUS 250-50 MCG/DOSE AEPB      inhale 1 dose by mouth twice a day   60 each   4   . albuterol (PROVENTIL) (2.5 MG/3ML) 0.083% nebulizer solution   Nebulization   Take 2.5 mg by nebulization every 6 (six) hours as needed. For shortness of breath         .  albuterol (VENTOLIN HFA) 108 (90 BASE) MCG/ACT inhaler   Inhalation   Inhale 2 puffs into the lungs every 6 (six) hours as needed.           Marland Kitchen aspirin EC 81 MG tablet   Oral   Take 1 tablet (81 mg total) by mouth daily.   30 tablet   2   . cyclobenzaprine (FLEXERIL) 5 MG tablet   Oral   Take 1 tablet (5 mg total) by mouth 3 (three) times daily as needed for muscle spasms.   6 tablet   0   . diazepam (VALIUM) 10 MG tablet   Oral   Take 1 tablet (10 mg total) by mouth every 12 (twelve) hours as needed for anxiety.   14 tablet   0   . lactulose (CHRONULAC) 10 GM/15ML solution   Oral   Take 30 mL (20 g total) by mouth 3 (three) times daily. Take 45 cc po every morning   500 mL   1   . loratadine (CLARITIN) 10 MG tablet   Oral   Take 10 mg by mouth daily.         Marland Kitchen lubiprostone (AMITIZA) 24 MCG capsule   Oral   Take 24 mcg by mouth daily with breakfast.         .  mometasone (NASONEX) 50 MCG/ACT nasal spray   Nasal   Place 2 sprays into the nose 2 (two) times daily.          Marland Kitchen omeprazole (PRILOSEC) 20 MG capsule   Oral   Take 20 mg by mouth 2 (two) times daily.         . ondansetron (ZOFRAN) 4 MG tablet   Oral   Take 1 tablet (4 mg total) by mouth every 8 (eight) hours as needed for nausea.   12 tablet   0   . oxyCODONE (OXYCONTIN) 10 MG 12 hr tablet   Oral   Take 1 tablet (10 mg total) by mouth every 12 (twelve) hours as needed. For pain   14 tablet   0   . polyethylene glycol powder (MIRALAX) powder   Oral   Take 17 g by mouth daily as needed.   255 g   0   . promethazine (PHENERGAN) 12.5 MG tablet   Oral   Take 1 tablet (12.5 mg total) by mouth every 6 (six) hours as needed for nausea.   15 tablet   0   . tiotropium (SPIRIVA HANDIHALER) 18 MCG inhalation capsule   Inhalation   Place 1 capsule (18 mcg total) into inhaler and inhale daily.   30 capsule   6    BP 109/68  Pulse 111  Temp(Src) 98.3 F (36.8 C) (Oral)  Resp 20  SpO2 94% Physical Exam  Nursing note and vitals reviewed. Constitutional: She is oriented to person, place, and time. She appears well-developed and well-nourished.  HENT:  Head: Normocephalic and atraumatic.  Nose: Nose normal.  Mouth/Throat: Oropharynx is clear and moist.  Eyes: Conjunctivae and EOM are normal. Pupils are equal, round, and reactive to light.  Neck: Normal range of motion. Neck supple. No JVD present. No tracheal deviation present. No thyromegaly present.  Cardiovascular: Normal rate, regular rhythm, normal heart sounds and intact distal pulses.  Exam reveals no gallop and no friction rub.   No murmur heard. Pulmonary/Chest: Effort normal and breath sounds normal. No stridor. No respiratory distress. She has no wheezes. She has no rales. She exhibits no  tenderness.  Abdominal: Soft. Bowel sounds are normal. She exhibits no distension and no mass. There is tenderness (right abd  pain with palpation). There is no rebound and no guarding.  Genitourinary: Guaiac positive stool.  Musculoskeletal: Normal range of motion. She exhibits no edema and no tenderness.  Lymphadenopathy:    She has no cervical adenopathy.  Neurological: She is alert and oriented to person, place, and time. She exhibits normal muscle tone. Coordination normal.  Skin: Skin is warm and dry. No rash noted. No erythema. No pallor.  Psychiatric: She has a normal mood and affect. Her behavior is normal. Judgment and thought content normal.    ED Course  Procedures (including critical care time) Labs Review Labs Reviewed  CBC WITH DIFFERENTIAL - Abnormal; Notable for the following:    WBC 13.1 (*)    RBC 5.34 (*)    Hemoglobin 16.1 (*)    HCT 48.0 (*)    Neutro Abs 8.0 (*)    All other components within normal limits  URINALYSIS, ROUTINE W REFLEX MICROSCOPIC - Abnormal; Notable for the following:    Hgb urine dipstick LARGE (*)    All other components within normal limits  LACTIC ACID, PLASMA - Abnormal; Notable for the following:    Lactic Acid, Venous 2.5 (*)    All other components within normal limits  CBC - Abnormal; Notable for the following:    WBC 12.1 (*)    Hemoglobin 15.2 (*)    All other components within normal limits  OCCULT BLOOD, POC DEVICE - Abnormal; Notable for the following:    Fecal Occult Bld POSITIVE (*)    All other components within normal limits  POCT I-STAT, CHEM 8 - Abnormal; Notable for the following:    BUN 5 (*)    Hemoglobin 17.7 (*)    HCT 52.0 (*)    All other components within normal limits  CLOSTRIDIUM DIFFICILE BY PCR  STOOL CULTURE  APTT  PROTIME-INR  URINE MICROSCOPIC-ADD ON  PROTIME-INR  COMPREHENSIVE METABOLIC PANEL  SEDIMENTATION RATE  C-REACTIVE PROTEIN  CBC WITH DIFFERENTIAL   Imaging Review Ct Abdomen Pelvis W Contrast  10/30/2012   CLINICAL DATA:  Right lower quadrant pain.  EXAM: CT ABDOMEN AND PELVIS WITH CONTRAST  TECHNIQUE:  Multidetector CT imaging of the abdomen and pelvis was performed using the standard protocol following bolus administration of intravenous contrast.  CONTRAST:  50mL OMNIPAQUE IOHEXOL 300 MG/ML SOLN, OMNIPAQUE IOHEXOL 300 MG/ML SOLN  COMPARISON:  09/30/2011  FINDINGS: Lung bases are clear. No effusions. Heart is normal size.  Liver, gallbladder, pancreas, adrenals and kidneys are normal. Calcifications in the spleen compatible with old granulomatous disease.  Moderate stool in the right colon appendix is visualized and is normal. Small bowel is decompressed. There is apparent wall thickening involving the descending colon. Slight surrounding inflammatory change. This extends into the proximal sigmoid colon. Findings concerning for left-sided colitis, likely infectious or inflammatory. Mesenteric vessels are patent. Calcifications in the distal aorta and iliac vessels without aneurysm.  Uterus, urinary bladder, and adnexa are unremarkable. No free fluid, free air or adenopathy.  No acute bony abnormality.  IMPRESSION: Descending colonic and proximal sigmoid wall thickening and surrounding inflammation concerning for infectious or inflammatory colitis.  Moderate stool burden in the right colon.  Appendix normal.   Electronically Signed   By: Charlett Nose   On: 10/30/2012 01:32    MDM   1. Colitis   2. Chronic nausea   3. Chronic constipation  52 yo female with acute on chronic abdominal pain, now with blood per rectum.  Concern for diverticulitis, possible colonic malignancy.  Will get labs, ct abd pelvis.    Olivia Mackie, MD 10/30/12 [phone]

## 2012-10-30 ENCOUNTER — Inpatient Hospital Stay (HOSPITAL_COMMUNITY): Payer: Medicaid Other

## 2012-10-30 ENCOUNTER — Encounter (HOSPITAL_COMMUNITY): Payer: Self-pay | Admitting: General Practice

## 2012-10-30 DIAGNOSIS — K625 Hemorrhage of anus and rectum: Secondary | ICD-10-CM

## 2012-10-30 DIAGNOSIS — K5289 Other specified noninfective gastroenteritis and colitis: Secondary | ICD-10-CM

## 2012-10-30 DIAGNOSIS — R109 Unspecified abdominal pain: Secondary | ICD-10-CM | POA: Diagnosis present

## 2012-10-30 DIAGNOSIS — K529 Noninfective gastroenteritis and colitis, unspecified: Secondary | ICD-10-CM | POA: Diagnosis present

## 2012-10-30 DIAGNOSIS — F319 Bipolar disorder, unspecified: Secondary | ICD-10-CM

## 2012-10-30 LAB — CBC
HCT: 44.2 % (ref 36.0–46.0)
Hemoglobin: 14.6 g/dL (ref 12.0–15.0)
MCH: 30.9 pg (ref 26.0–34.0)
MCH: 30.2 pg (ref 26.0–34.0)
MCHC: 34.4 g/dL (ref 30.0–36.0)
MCHC: 33.5 g/dL (ref 30.0–36.0)
MCV: 91.2 fL (ref 78.0–100.0)
Platelets: 188 10*3/uL (ref 150–400)
Platelets: 217 10*3/uL (ref 150–400)
RBC: 4.57 MIL/uL (ref 3.87–5.11)
RDW: 13.5 % (ref 11.5–15.5)
RDW: 13.6 % (ref 11.5–15.5)
RDW: 13.5 % (ref 11.5–15.5)
WBC: 7.3 10*3/uL (ref 4.0–10.5)

## 2012-10-30 LAB — URINE MICROSCOPIC-ADD ON

## 2012-10-30 LAB — URINALYSIS, ROUTINE W REFLEX MICROSCOPIC
Bilirubin Urine: NEGATIVE
Glucose, UA: NEGATIVE mg/dL
Ketones, ur: NEGATIVE mg/dL
Leukocytes, UA: NEGATIVE
pH: 6 (ref 5.0–8.0)

## 2012-10-30 LAB — POCT I-STAT, CHEM 8
Calcium, Ion: 1.21 mmol/L (ref 1.12–1.23)
Chloride: 100 mEq/L (ref 96–112)
HCT: 52 % — ABNORMAL HIGH (ref 36.0–46.0)
Sodium: 139 mEq/L (ref 135–145)

## 2012-10-30 LAB — COMPREHENSIVE METABOLIC PANEL
ALT: 10 U/L (ref 0–35)
AST: 15 U/L (ref 0–37)
Alkaline Phosphatase: 75 U/L (ref 39–117)
CO2: 30 mEq/L (ref 19–32)
GFR calc Af Amer: >90 mL/min (ref >90)
GFR calc non Af Amer: >90 mL/min (ref >90)
Glucose, Bld: 109 mg/dL — ABNORMAL HIGH (ref 70–99)
Potassium: 3.8 mEq/L (ref 3.5–5.1)
Sodium: 135 mEq/L (ref 135–145)
Total Protein: 6.7 g/dL (ref 6.0–8.3)

## 2012-10-30 LAB — PROTIME-INR: INR: 1.01 (ref 0.00–1.49)

## 2012-10-30 LAB — OCCULT BLOOD, POC DEVICE: Fecal Occult Bld: POSITIVE — AB

## 2012-10-30 MED ORDER — PROCHLORPERAZINE MALEATE 10 MG PO TABS
10.0000 mg | ORAL_TABLET | Freq: Four times a day (QID) | ORAL | Status: DC | PRN
  Administered 2012-10-30: 06:00:00 10 mg via ORAL
  Filled 2012-10-30 (×2): qty 1

## 2012-10-30 MED ORDER — HYDROMORPHONE HCL PF 1 MG/ML IJ SOLN
0.5000 mg | INTRAMUSCULAR | Status: DC | PRN
  Administered 2012-10-30 – 2012-11-01 (×9): 0.5 mg via INTRAVENOUS
  Filled 2012-10-30 (×9): qty 1

## 2012-10-30 MED ORDER — ONDANSETRON HCL 4 MG/2ML IJ SOLN
4.0000 mg | Freq: Four times a day (QID) | INTRAMUSCULAR | Status: DC | PRN
  Administered 2012-10-30 – 2012-10-31 (×3): 4 mg via INTRAVENOUS
  Filled 2012-10-30 (×2): qty 2

## 2012-10-30 MED ORDER — MORPHINE SULFATE 2 MG/ML IJ SOLN
2.0000 mg | INTRAMUSCULAR | Status: DC | PRN
  Administered 2012-11-02: 2 mg via INTRAVENOUS
  Filled 2012-10-30 (×2): qty 1

## 2012-10-30 MED ORDER — SODIUM CHLORIDE 0.9 % IJ SOLN
3.0000 mL | Freq: Two times a day (BID) | INTRAMUSCULAR | Status: DC
  Administered 2012-10-30 – 2012-11-02 (×6): 3 mL via INTRAVENOUS

## 2012-10-30 MED ORDER — ALBUTEROL SULFATE (5 MG/ML) 0.5% IN NEBU
INHALATION_SOLUTION | RESPIRATORY_TRACT | Status: AC
  Filled 2012-10-30: qty 0.5

## 2012-10-30 MED ORDER — CIPROFLOXACIN IN D5W 400 MG/200ML IV SOLN
400.0000 mg | Freq: Once | INTRAVENOUS | Status: AC
  Administered 2012-10-30: 400 mg via INTRAVENOUS
  Filled 2012-10-30: qty 200

## 2012-10-30 MED ORDER — ALBUTEROL SULFATE HFA 108 (90 BASE) MCG/ACT IN AERS: 2.0000 | INHALATION_SPRAY | Freq: Four times a day (QID) | RESPIRATORY_TRACT | Status: DC | PRN

## 2012-10-30 MED ORDER — NICOTINE 14 MG/24HR TD PT24
14.0000 mg | MEDICATED_PATCH | Freq: Every day | TRANSDERMAL | Status: DC
  Administered 2012-10-30 – 2012-11-02 (×4): 14 mg via TRANSDERMAL
  Filled 2012-10-30 (×4): qty 1

## 2012-10-30 MED ORDER — ONDANSETRON HCL 4 MG/2ML IJ SOLN
4.0000 mg | Freq: Once | INTRAMUSCULAR | Status: DC
  Filled 2012-10-30 (×2): qty 2

## 2012-10-30 MED ORDER — MOMETASONE FURO-FORMOTEROL FUM 100-5 MCG/ACT IN AERO
2.0000 | INHALATION_SPRAY | Freq: Two times a day (BID) | RESPIRATORY_TRACT | Status: DC
  Administered 2012-10-30 – 2012-11-02 (×7): 2 via RESPIRATORY_TRACT
  Filled 2012-10-30 (×2): qty 8.8

## 2012-10-30 MED ORDER — CYCLOBENZAPRINE HCL 5 MG PO TABS
5.0000 mg | ORAL_TABLET | Freq: Three times a day (TID) | ORAL | Status: DC | PRN
  Administered 2012-10-30 – 2012-11-02 (×6): 5 mg via ORAL
  Filled 2012-10-30 (×6): qty 1

## 2012-10-30 MED ORDER — LORATADINE 10 MG PO TABS
10.0000 mg | ORAL_TABLET | Freq: Every day | ORAL | Status: DC
  Administered 2012-10-30 – 2012-11-02 (×4): 10 mg via ORAL
  Filled 2012-10-30 (×4): qty 1

## 2012-10-30 MED ORDER — METRONIDAZOLE IN NACL 5-0.79 MG/ML-% IV SOLN
500.0000 mg | Freq: Once | INTRAVENOUS | Status: AC
  Administered 2012-10-30: 500 mg via INTRAVENOUS
  Filled 2012-10-30: qty 100

## 2012-10-30 MED ORDER — PROMETHAZINE HCL 25 MG/ML IJ SOLN
12.5000 mg | Freq: Three times a day (TID) | INTRAMUSCULAR | Status: DC | PRN
  Administered 2012-10-30 – 2012-11-02 (×4): 12.5 mg via INTRAVENOUS
  Filled 2012-10-30 (×7): qty 1

## 2012-10-30 MED ORDER — PROMETHAZINE HCL 25 MG/ML IJ SOLN
12.5000 mg | Freq: Once | INTRAMUSCULAR | Status: AC
  Administered 2012-10-30: 12.5 mg via INTRAVENOUS
  Filled 2012-10-30: qty 1

## 2012-10-30 MED ORDER — TIOTROPIUM BROMIDE MONOHYDRATE 18 MCG IN CAPS
18.0000 ug | ORAL_CAPSULE | Freq: Every day | RESPIRATORY_TRACT | Status: DC
  Administered 2012-10-30 – 2012-11-01 (×3): 18 ug via RESPIRATORY_TRACT
  Filled 2012-10-30 (×2): qty 5

## 2012-10-30 MED ORDER — IOHEXOL 300 MG/ML  SOLN
100.0000 mL | Freq: Once | INTRAMUSCULAR | Status: AC | PRN
  Administered 2012-10-30: 100 mL via INTRAVENOUS

## 2012-10-30 MED ORDER — PANTOPRAZOLE SODIUM 40 MG IV SOLR
40.0000 mg | Freq: Two times a day (BID) | INTRAVENOUS | Status: DC
  Administered 2012-10-30 – 2012-10-31 (×3): 40 mg via INTRAVENOUS
  Filled 2012-10-30 (×4): qty 40

## 2012-10-30 MED ORDER — METRONIDAZOLE IN NACL 5-0.79 MG/ML-% IV SOLN
500.0000 mg | Freq: Three times a day (TID) | INTRAVENOUS | Status: DC
  Administered 2012-10-30 – 2012-10-31 (×3): 500 mg via INTRAVENOUS
  Filled 2012-10-30 (×4): qty 100

## 2012-10-30 MED ORDER — SACCHAROMYCES BOULARDII 250 MG PO CAPS
250.0000 mg | ORAL_CAPSULE | Freq: Two times a day (BID) | ORAL | Status: DC
  Administered 2012-10-30 – 2012-10-31 (×2): 250 mg via ORAL
  Filled 2012-10-30 (×4): qty 1

## 2012-10-30 MED ORDER — ONDANSETRON HCL 4 MG PO TABS: 4.0000 mg | ORAL_TABLET | Freq: Four times a day (QID) | ORAL | Status: DC | PRN

## 2012-10-30 MED ORDER — ALBUTEROL SULFATE (5 MG/ML) 0.5% IN NEBU
2.5000 mg | INHALATION_SOLUTION | Freq: Four times a day (QID) | RESPIRATORY_TRACT | Status: DC | PRN
  Administered 2012-10-30 – 2012-11-01 (×2): 2.5 mg via RESPIRATORY_TRACT
  Filled 2012-10-30: qty 0.5

## 2012-10-30 MED ORDER — CIPROFLOXACIN IN D5W 400 MG/200ML IV SOLN
400.0000 mg | Freq: Two times a day (BID) | INTRAVENOUS | Status: DC
  Administered 2012-10-30 – 2012-10-31 (×2): 400 mg via INTRAVENOUS
  Filled 2012-10-30 (×3): qty 200

## 2012-10-30 MED ORDER — DIAZEPAM 5 MG PO TABS
5.0000 mg | ORAL_TABLET | Freq: Two times a day (BID) | ORAL | Status: DC | PRN
  Administered 2012-10-30 – 2012-11-01 (×4): 5 mg via ORAL
  Filled 2012-10-30 (×4): qty 1

## 2012-10-30 MED ORDER — FLUTICASONE PROPIONATE 50 MCG/ACT NA SUSP
2.0000 | Freq: Every day | NASAL | Status: DC
  Administered 2012-10-30 – 2012-11-02 (×3): 2 via NASAL
  Filled 2012-10-30 (×2): qty 16

## 2012-10-30 MED ORDER — BOOST PLUS PO LIQD
237.0000 mL | Freq: Three times a day (TID) | ORAL | Status: DC
  Administered 2012-10-30 – 2012-11-02 (×5): 237 mL via ORAL
  Filled 2012-10-30 (×10): qty 237

## 2012-10-30 MED ORDER — SODIUM CHLORIDE 0.9 % IV BOLUS (SEPSIS)
1000.0000 mL | Freq: Once | INTRAVENOUS | Status: AC
  Administered 2012-10-30: 08:00:00 1000 mL via INTRAVENOUS

## 2012-10-30 MED ORDER — FENTANYL CITRATE 0.05 MG/ML IJ SOLN
50.0000 ug | Freq: Once | INTRAMUSCULAR | Status: AC
  Administered 2012-10-30: 50 ug via INTRAVENOUS
  Filled 2012-10-30: qty 2

## 2012-10-30 MED ORDER — SODIUM CHLORIDE 0.9 % IV SOLN
INTRAVENOUS | Status: DC
  Administered 2012-10-30: 08:00:00 via INTRAVENOUS
  Administered 2012-10-30: 100 mL/h via INTRAVENOUS
  Administered 2012-10-31: 02:00:00 via INTRAVENOUS

## 2012-10-30 NOTE — Progress Notes (Signed)
Patient is upset about having a bed alarm on. Explained about her fall risk score and the need per protocol for the bed alarm and offered for the charge nurse to come by and also to explain. Will continue to educate. Patient is argumentative about explanations. Erskin Burnet RN

## 2012-10-30 NOTE — H&P (Signed)
[facility] History and Physical  Patient: [name]  ZOX:[id number]  DOB: [birth year]  DOA: 10/29/2012  Referring physician: Dr. Norlene Campbell PCP: Kaleen Mask, MD  Specialists: Dr. Loreta Ave  Chief Complaint: Abdominal pain nausea and vomiting, with blood in bowel  HPI: [name] is a 52 y.o. female with Past medical history of colonic inertia, GERD, iron deficiency anemia, recent admission for TIA. Today she presented with the complaint of abdominal pain that started yesterday located on the right side felt like a continuous sharp pain. Followed by nausea and vomiting without any blood. This morning when she woke up and went for urination she had bright red blood per rectum, she continues to have similar episodes 4 times at home with dark maroon blood. When she was in the ER she had another episode. She had a colonoscopy done with Dr. Melina Fiddler 3 years ago which did not show diverticulosis, or any mucosal abnormality.  She mentions that she had a fever of 101 and has taken Tylenol before arrival. She denies any complaint of chest pain, but have some epigastric discomfort and heartburn. Denies any shortness of breath headache focal neurological deficit.  Review of Systems: as mentioned in the history of present illness.  A Comprehensive review of the other systems is negative.  Past Medical History  Diagnosis Date  . TIA (transient ischemic attack)   . GERD (gastroesophageal reflux disease)   . Hepatomegaly     hx  . Colonic inertia   . Lung abscess     "calcified over"; bilateral   . COPD (chronic obstructive pulmonary disease)   . Stroke 2009    TIA  . PTSD (post-traumatic stress disorder)   . Bipolar disorder   . Depression   . Chronic headache   . Seizures     in past  . Fibromyalgia   . Iron deficiency anemia   . Arthritis    Past Surgical History  Procedure Laterality Date  . 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  Cesarean section      x 2  . Esophagogastroduodenoscopy  12/23/2011    Procedure: ESOPHAGOGASTRODUODENOSCOPY (EGD);  Surgeon: Hart Carwin, MD;  Location: [facility];  Service: Endoscopy;  Laterality: N/A;   Social History:  reports that she has been smoking.  She has never used smokeless tobacco. She reports that she does not drink alcohol or use illicit drugs. Patient is coming from home. Independent for most of her  ADL.  Allergies  Allergen Reactions  . Oxycodone-Acetaminophen Anaphylaxis, Itching and Nausea And Vomiting    Tylox (Pt states, "I can only take Demerol, Oxycontin, and Dilaudid")  . Codeine Nausea And Vomiting  . Hydrocodone Nausea And Vomiting    Family History  Problem Relation Age of Onset  . Emphysema Father   . Asthma Son   . Clotting disorder Mother   . Rheum arthritis Sister   . Rheum arthritis Brother   . Ovarian cancer Sister     Prior to Admission medications   Medication Sig Start Date End Date Taking? Authorizing Provider  albuterol (PROVENTIL) (2.5 MG/3ML) 0.083% nebulizer solution Take 2.5 mg by nebulization every 6 (six) hours as needed for shortness of breath. For shortness of breath   Yes Historical Provider, MD  albuterol (VENTOLIN HFA) 108 (90 BASE) MCG/ACT inhaler Inhale 2 puffs into the lungs every 6 (six) hours as needed for wheezing or shortness of breath.  Yes Historical Provider, MD  aspirin EC 81 MG tablet Take 1 tablet (81 mg total) by mouth daily. 10/14/12  Yes Pamella Pert, MD  cyclobenzaprine (FLEXERIL) 5 MG tablet Take 1 tablet (5 mg total) by mouth 3 (three) times daily as needed for muscle spasms. 10/14/12  Yes Pamella Pert, MD  diazepam (VALIUM) 10 MG tablet Take 1 tablet (10 mg total) by mouth every 12 (twelve) hours as needed for anxiety. 10/14/12  Yes Costin Gherghe, MD  Fluticasone-Salmeterol (ADVAIR) 250-50 MCG/DOSE AEPB Inhale 1 puff into the lungs every 12 (twelve) hours.   Yes Historical Provider, MD  lactulose  (CHRONULAC) 10 GM/15ML solution Take 30 mLs by mouth 3 (three) times daily. 10/14/12  Yes Pamella Pert, MD  loratadine (CLARITIN) 10 MG tablet Take 10 mg by mouth every morning.    Yes Historical Provider, MD  mometasone (NASONEX) 50 MCG/ACT nasal spray Place 2 sprays into the nose 2 (two) times daily.    Yes Historical Provider, MD  omeprazole (PRILOSEC) 20 MG capsule Take 20 mg by mouth 2 (two) times daily.   Yes Historical Provider, MD  oxyCODONE (OXYCONTIN) 10 MG 12 hr tablet Take 1 tablet (10 mg total) by mouth every 12 (twelve) hours as needed. For pain 10/14/12  Yes Pamella Pert, MD  polyethylene glycol (MIRALAX / GLYCOLAX) packet Take 17 g by mouth daily as needed (constipation).   Yes Historical Provider, MD  promethazine (PHENERGAN) 25 MG tablet Take 12.5-25 mg by mouth every 6 (six) hours as needed for nausea.   Yes Historical Provider, MD  tiotropium (SPIRIVA HANDIHALER) 18 MCG inhalation capsule Place 1 capsule (18 mcg total) into inhaler and inhale daily. 03/23/12  Yes Barbaraann Share, MD  prochlorperazine (COMPAZINE) 10 MG tablet Take 10 mg by mouth every 6 (six) hours as needed (nausea).    Historical Provider, MD    Physical Exam: Filed Vitals:   10/30/12 0018 10/30/12 0320 10/30/12 0441 10/30/12 0457  BP: 129/64 113/70 101/54   Pulse: 103 92 92   Temp: 98.1 F (36.7 C) 97.9 F (36.6 C) 97.4 F (36.3 C)   TempSrc: Oral Oral Oral   Resp: 18 15 18    Height:    5' 6.93" (1.7 m)  Weight:    57.8 kg (127 lb 6.8 oz)  SpO2: 97% 97% 92%     General: Alert, Awake and Oriented to Time, Place and Person. Appear in moderate distress Eyes: PERRL ENT: Oral Mucosa clear dry. Neck: No JVD, no Carotid Bruits  Cardiovascular: S1 and S2 Present, no Murmur, Peripheral Pulses Present Respiratory: Bilateral Air entry equal and Decreased, Clear to Auscultation,  No Crackles, no wheezes Abdomen: Bowel Sound Present, Soft and Non tender on the left, mild tenderness on the right side. Skin:  No Rash Extremities: Trace Pedal edema, no calf tenderness Neurologic: Grossly Unremarkable.  Labs on Admission:  CBC:  Recent Labs Lab 10/29/12 2320 10/30/12  WBC 13.1*  --   NEUTROABS 8.0*  --   HGB 16.1* 17.7*  HCT 48.0* 52.0*  MCV 89.9  --   PLT 246  --     CMP     Component Value Date/Time   NA 139 10/30/2012 0000   K 3.8 10/30/2012 0000   CL 100 10/30/2012 0000   CO2 33* 10/14/2012 0500   GLUCOSE 87 10/30/2012 0000   BUN 5* 10/30/2012 0000   CREATININE 0.80 10/30/2012 0000   CALCIUM 10.2 10/14/2012 0500   PROT 7.7 10/13/2012 0515   ALBUMIN  3.9 10/13/2012 0515   AST 20 10/13/2012 0515   ALT 13 10/13/2012 0515   ALKPHOS 82 10/13/2012 0515   BILITOT 0.3 10/13/2012 0515   GFRNONAA >90 10/14/2012 0500   GFRAA >90 10/14/2012 0500    No results found for this basename: LIPASE, AMYLASE,  in the last 168 hours No results found for this basename: AMMONIA,  in the last 168 hours  Cardiac Enzymes: No results found for this basename: CKTOTAL, CKMB, CKMBINDEX, TROPONINI,  in the last 168 hours  BNP (last 3 results) No results found for this basename: PROBNP,  in the last 8760 hours  Radiological Exams on Admission: Ct Abdomen Pelvis W Contrast  10/30/2012   CLINICAL DATA:  Right lower quadrant pain.  EXAM: CT ABDOMEN AND PELVIS WITH CONTRAST  TECHNIQUE: Multidetector CT imaging of the abdomen and pelvis was performed using the standard protocol following bolus administration of intravenous contrast.  CONTRAST:  50mL OMNIPAQUE IOHEXOL 300 MG/ML SOLN, OMNIPAQUE IOHEXOL 300 MG/ML SOLN  COMPARISON:  09/30/2011  FINDINGS: Lung bases are clear. No effusions. Heart is normal size.  Liver, gallbladder, pancreas, adrenals and kidneys are normal. Calcifications in the spleen compatible with old granulomatous disease.  Moderate stool in the right colon appendix is visualized and is normal. Small bowel is decompressed. There is apparent wall thickening involving the descending colon. Slight  surrounding inflammatory change. This extends into the proximal sigmoid colon. Findings concerning for left-sided colitis, likely infectious or inflammatory. Mesenteric vessels are patent. Calcifications in the distal aorta and iliac vessels without aneurysm.  Uterus, urinary bladder, and adnexa are unremarkable. No free fluid, free air or adenopathy.  No acute bony abnormality.  IMPRESSION: Descending colonic and proximal sigmoid wall thickening and surrounding inflammation concerning for infectious or inflammatory colitis.  Moderate stool burden in the right colon.  Appendix normal.   Electronically Signed   By: Charlett Nose   On: 10/30/2012 01:32   Assessment/Plan Principal Problem:   Colitis Active Problems:   GERD   Abdominal pain   1. Colitis The patient presents with abdominal pain, hematochezia and vomiting. She had a CT scan that shows possible infectious versus inflammatory colitis involving descending colon and proximal sigmoid. Although she does not have any pain on the left side on examination. She has signs of hemoconcentration. But her hemoglobin appears stable. She does not appear to have any coagulopathy. Other than recent hospitalization she does not have any significant risk factor for C. Difficile. Considering this I will monitor patient's H&H. GI will be consulted, patient has seen Dr. Melina Fiddler from [facility] in the past. IV Protonix. IV fluids. IV Dilaudid for pain control. IV Zofran for nausea. I will keep the patient n.p.o.. Since the patient's hemoglobin is stable as well as she is hemodynamically stable she can safely monitored on telemetry floor. Considering possibility of infectious etiology I will give the patient IV Cipro IV Flagyl. I will check C. difficile PCR, and stool cultures.  2. Anxiety Continue diazepam at a lower dose  3. History of COPD Continue inhalers  DVT Prophylaxis: mechanical compression device Nutrition: npo  Code Status: full     Author: Lynden Oxford, MD [facility] Pager: [phone] 10/30/2012, 5:37 AM    If 7PM-7AM, please contact night-coverage [email or web address] Password [email or web address]

## 2012-10-30 NOTE — ED Notes (Signed)
Patient transported to CT 

## 2012-10-30 NOTE — Progress Notes (Signed)
Patient is again reminded of the hospital fall policy and that she needs to have a a bed alarm on. Went through each point again on her fall risk score. Emphasized that this is per protocol and for her safety and is not something that we do without solid reason. Patient is extremely argumentative. Erskin Burnet RN

## 2012-10-30 NOTE — ED Notes (Signed)
Pt went to bathroom passed bright red blood from rectum  rn notifited

## 2012-10-30 NOTE — Progress Notes (Signed)
Pt is unhappy with having to have the bed alarm on.  Pt's direct care RN has already spoken with the pt and attempted to explain the need for the bed alarm.  Pt was not satisfied after the conversation because she said she was told by a physician that she did not need the bed alarm.  This RN, Press photographer, went in to explain to the pt about how we assess fall risk, was shown the fall risk assessment tool, her own fall risk score, along with the risk factors that she has that makes up the fall risk score.  The pt was then shown the protocol for the interventions required of a patient with that high of a fall risk score.  Pt is concerned because she say she get cramps in her feet and needs to get up and walk around often to "work the cramp out."  Pt was told to call the RN or the tech if she needs to ambulate.  Pt still insists that she does not want the bed alarm on when her husband gets here so that he can ambulate her.  It was explained to the pt that the ambulation must still be supervised by our staff per fall risk protocol.  Pt does not wish to comply with this protocol when her husband arrives.  Risks of a fall were explained to the patient.  Bed alarm is currently still on now.

## 2012-10-30 NOTE — Progress Notes (Signed)
INITIAL NUTRITION ASSESSMENT  DOCUMENTATION CODES Per approved criteria  -Severe malnutrition in the context of acute illness or injury   INTERVENTION:  Boost tid  Monitor diet tolerance and intake.  (Patient received full liquid tray while I was in the room.  This tray had double amount usually provided.  Told patient that she should only eat small amounts until tolerance of food established.)  NUTRITION DIAGNOSIS: Inadequate oral intake related to altered gi function as evidenced by diagnosis, diet hx.  Goal: Tolerate diet advancement with intake >90% of meals/supplements.  Monitor:  Diet advancement, tolerance, intake, labs, weight  Reason for Assessment: mst  52 y.o. female  Admitting Dx: Colitis  ASSESSMENT: Patient admitted with colitis.  Reported 16 lb weight loss and chronic nausea over the past month.  Patient reports intake of 3-4 Boost daily and oatmeal with the occasional meal.  Otherwise reported not tolerating.  Patient with sunken eyes, Very black orbital area.    Patient meets criteria for severe malnutrition related to chronic illness AEB 9% weight loss in the past 3 1/2 months and intake <50% for the past month.    Height: Ht Readings from Last 1 Encounters:  10/30/12 5' 6.93" (1.7 m)    Weight: Wt Readings from Last 1 Encounters:  10/30/12 127 lb 6.8 oz (57.8 kg)    Ideal Body Weight: 135 lbs  % Ideal Body Weight: 94  Wt Readings from Last 10 Encounters:  10/30/12 127 lb 6.8 oz (57.8 kg)  10/13/12 127 lb 6.4 oz (57.788 kg)  07/13/12 138 lb (62.596 kg)  02/18/12 134 lb (60.782 kg)  12/23/11 134 lb (60.782 kg)  12/23/11 134 lb (60.782 kg)  12/01/11 136 lb 3.2 oz (61.78 kg)  10/27/11 136 lb 12.8 oz (62.052 kg)  10/09/11 137 lb (62.143 kg)  02/09/11 136 lb (61.689 kg)    Usual Body Weight: 138 lbs in May 2014, 144 lbs 1 month ago per patient.  % Usual Body Weight: 8% weight loss in the past 3.5 months.12% loss in the past month per patient  report.  BMI:  Body mass index is 20 kg/(m^2).  Estimated Nutritional Needs: Kcal: 1600-1700 Protein: 70-80 gm Fluid: >1.6L  Skin: no rash  Diet Order: Full Liquid  EDUCATION NEEDS: -Education needs addressed   Intake/Output Summary (Last 24 hours) at 10/30/12 1519 Last data filed at 10/30/12 1004  Gross per 24 hour  Intake    200 ml  Output      0 ml  Net    200 ml    Labs:   Recent Labs Lab 10/30/12 10/30/12 0540  NA 139 135  K 3.8 3.8  CL 100 98  CO2  --  30  BUN 5* 6  CREATININE 0.80 0.62  CALCIUM  --  9.5  GLUCOSE 87 109*    CBG (last 3)   Recent Labs  10/30/12 0732  GLUCAP 90    Scheduled Meds: . ciprofloxacin  400 mg Intravenous Q12H  . fluticasone  2 spray Each Nare Daily  . loratadine  10 mg Oral Daily  . metronidazole  500 mg Intravenous Q8H  . mometasone-formoterol  2 puff Inhalation BID  . nicotine  14 mg Transdermal Daily  . ondansetron  4 mg Intravenous Once  . pantoprazole (PROTONIX) IV  40 mg Intravenous Q12H  . sodium chloride  3 mL Intravenous Q12H  . tiotropium  18 mcg Inhalation Daily    Continuous Infusions: . sodium chloride 125 mL/hr at 10/30/12 0813  Past Medical History  Diagnosis Date  . TIA (transient ischemic attack)   . GERD (gastroesophageal reflux disease)   . Hepatomegaly     hx  . Colonic inertia   . Lung abscess     "calcified over"; bilateral   . COPD (chronic obstructive pulmonary disease)   . Stroke 2009    TIA  . PTSD (post-traumatic stress disorder)   . Bipolar disorder   . Depression   . Chronic headache   . Seizures     in past  . Fibromyalgia   . Iron deficiency anemia   . Arthritis     Past Surgical History  Procedure Laterality Date  . 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 Cesarean section      x 2  . Esophagogastroduodenoscopy  12/23/2011    Procedure: ESOPHAGOGASTRODUODENOSCOPY (EGD);  Surgeon: Hart Carwin, MD;  Location: [facility];   Service: Endoscopy;  Laterality: N/A;    Oran Rein, RD, LDN Clinical Inpatient Dietitian Pager:  [phone] Weekend and after hours pager:  [phone]

## 2012-10-30 NOTE — Consult Note (Signed)
Cross cover [facility] Reason for Consult: Abdominal pain/rectal bleeding. Referring Physician: THP  [name] is an 52 y.o. female.  HPI: 52 year old white female, with multiple medical problems listed below, admitted for worsening RLQ pain and rectal bleeding, that started on Saturday morning. On Friday night she woke up with abdominal cramping. Patient gives a history of having a sluggish colon and on a colonoscopy done in 2011 by Dr. Juanda Chance, she was found to have a very redundant colon with no other abnormalities noted on exam. She had a EGD done last year for nausea, vomiting and abdominal pain and that exam was normal as well. She has a history of chronic constipation and gives a history of  BRBPR with loose stools for the last 2 days. As per her nurse, she has not had a BM since admission. The patient tells me she is not at a "high risk for falls" and wants to walk around the hallways. She seems to have a flight of ideas and it is hard to get a history from her. She claims she has lost 15 lbs in the last few weeks and may have gained 5 of those pounds back. She suffers from chronic constipation and also has GERD. There is a history of dysphagia in the past but no stricture or esophagitis was noted on her EGD. CT scan of the abdomen and pelvis revealed thickening of the descending colon ?infectious vs inflammatory colitis.    Past Medical History  Diagnosis Date  . TIA (transient ischemic attack)   . GERD (gastroesophageal reflux disease)   . Hepatomegaly     hx  . Colonic inertia   . Lung abscess     "calcified over"; bilateral   . COPD (chronic obstructive pulmonary disease)   . Stroke 2009    TIA  . PTSD (post-traumatic stress disorder)   . Bipolar disorder   . Depression   . Chronic headache   . Seizures     in past  . Fibromyalgia   . Iron deficiency anemia   . Arthritis    Past Surgical History  Procedure Laterality Date  . Vein surgery      transplant; removed from RT leg  to inside of LT arm   . Cesarean section      x 2  . Esophagogastroduodenoscopy  12/23/2011    Procedure: ESOPHAGOGASTRODUODENOSCOPY (EGD);  Surgeon: Hart Carwin, MD;  Location: [facility];  Service: Endoscopy;  Laterality: N/A;   Family History  Problem Relation Age of Onset  . Emphysema Father   . Asthma Son   . Clotting disorder Mother   . Rheum arthritis Sister   . Rheum arthritis Brother   . Ovarian cancer Sister    Social History:  reports that she has been smoking.  She has never used smokeless tobacco. She reports that she does not drink alcohol or use illicit drugs.  Allergies:  Allergies  Allergen Reactions  . Oxycodone-Acetaminophen Anaphylaxis, Itching and Nausea And Vomiting    Tylox (Pt states, "I can only take Demerol, Oxycontin, and Dilaudid")  . Codeine Nausea And Vomiting  . Hydrocodone Nausea And Vomiting   Medications: I have reviewed the patient's current medications.  Results for orders placed during the hospital encounter of 10/29/12 (from the past 48 hour(s))  CBC WITH DIFFERENTIAL     Status: Abnormal   Collection Time    10/29/12 11:20 PM      Result Value Range   WBC 13.1 (*)  4.0 - 10.5 K/uL   RBC 5.34 (*) 3.87 - 5.11 MIL/uL   Hemoglobin 16.1 (*) 12.0 - 15.0 g/dL   HCT 08.6 (*) 57.8 - 46.9 %   MCV 89.9  78.0 - 100.0 fL   MCH 30.1  26.0 - 34.0 pg   MCHC 33.5  30.0 - 36.0 g/dL   RDW 62.9  52.8 - 41.3 %   Platelets 246  150 - 400 K/uL   Neutrophils Relative % 61  43 - 77 %   Neutro Abs 8.0 (*) 1.7 - 7.7 K/uL   Lymphocytes Relative 31  12 - 46 %   Lymphs Abs 4.0  0.7 - 4.0 K/uL   Monocytes Relative 7  3 - 12 %   Monocytes Absolute 0.9  0.1 - 1.0 K/uL   Eosinophils Relative 1  0 - 5 %   Eosinophils Absolute 0.2  0.0 - 0.7 K/uL   Basophils Relative 0  0 - 1 %   Basophils Absolute 0.0  0.0 - 0.1 K/uL  LACTIC ACID, PLASMA     Status: Abnormal   Collection Time    10/29/12 11:30 PM      Result Value Range   Lactic Acid, Venous 2.5 (*) 0.5 -  2.2 mmol/L  APTT     Status: None   Collection Time    10/29/12 11:33 PM      Result Value Range   aPTT 27  24 - 37 seconds  PROTIME-INR     Status: None   Collection Time    10/29/12 11:33 PM      Result Value Range   Prothrombin Time 12.8  11.6 - 15.2 seconds   INR 0.98  0.00 - 1.49  POCT I-STAT, CHEM 8     Status: Abnormal   Collection Time    10/30/12 12:00 AM      Result Value Range   Sodium 139  135 - 145 mEq/L   Potassium 3.8  3.5 - 5.1 mEq/L   Chloride 100  96 - 112 mEq/L   BUN 5 (*) 6 - 23 mg/dL   Creatinine, Ser 2.44  0.50 - 1.10 mg/dL   Glucose, Bld 87  70 - 99 mg/dL   Calcium, Ion 0.10  2.72 - 1.23 mmol/L   TCO2 28  0 - 100 mmol/L   Hemoglobin 17.7 (*) 12.0 - 15.0 g/dL   HCT 53.6 (*) 64.4 - 03.4 %  OCCULT BLOOD, POC DEVICE     Status: Abnormal   Collection Time    10/30/12 12:03 AM      Result Value Range   Fecal Occult Bld POSITIVE (*) NEGATIVE  URINALYSIS, ROUTINE W REFLEX MICROSCOPIC     Status: Abnormal   Collection Time    10/30/12 12:15 AM      Result Value Range   Color, Urine YELLOW  YELLOW   APPearance CLEAR  CLEAR   Specific Gravity, Urine 1.013  1.005 - 1.030   pH 6.0  5.0 - 8.0   Glucose, UA NEGATIVE  NEGATIVE mg/dL   Hgb urine dipstick LARGE (*) NEGATIVE   Bilirubin Urine NEGATIVE  NEGATIVE   Ketones, ur NEGATIVE  NEGATIVE mg/dL   Protein, ur NEGATIVE  NEGATIVE mg/dL   Urobilinogen, UA 0.2  0.0 - 1.0 mg/dL   Nitrite NEGATIVE  NEGATIVE   Leukocytes, UA NEGATIVE  NEGATIVE  URINE MICROSCOPIC-ADD ON     Status: None   Collection Time    10/30/12 12:15 AM  Result Value Range   Squamous Epithelial / LPF RARE  RARE   WBC, UA 0-2  <3 WBC/hpf   RBC / HPF 0-2  <3 RBC/hpf   Bacteria, UA RARE  RARE  COMPREHENSIVE METABOLIC PANEL     Status: Abnormal   Collection Time    10/30/12  5:40 AM      Result Value Range   Sodium 135  135 - 145 mEq/L   Potassium 3.8  3.5 - 5.1 mEq/L   Chloride 98  96 - 112 mEq/L   CO2 30  19 - 32 mEq/L   Glucose,  Bld 109 (*) 70 - 99 mg/dL   BUN 6  6 - 23 mg/dL   Creatinine, Ser 3.08  0.50 - 1.10 mg/dL   Calcium 9.5  8.4 - 65.7 mg/dL   Total Protein 6.7  6.0 - 8.3 g/dL   Albumin 3.4 (*) 3.5 - 5.2 g/dL   AST 15  0 - 37 U/L   ALT 10  0 - 35 U/L   Alkaline Phosphatase 75  39 - 117 U/L   Total Bilirubin 0.6  0.3 - 1.2 mg/dL   GFR calc non Af Amer >90  >90 mL/min   GFR calc Af Amer >90  >90 mL/min   Comment: (NOTE)     The eGFR has been calculated using the CKD EPI equation.     This calculation has not been validated in all clinical situations.     eGFR's persistently <90 mL/min signify possible Chronic Kidney     Disease.  CBC     Status: Abnormal   Collection Time    10/30/12  5:40 AM      Result Value Range   WBC 12.1 (*) 4.0 - 10.5 K/uL   RBC 4.92  3.87 - 5.11 MIL/uL   Hemoglobin 15.2 (*) 12.0 - 15.0 g/dL   HCT 84.6  96.2 - 95.2 %   MCV 89.8  78.0 - 100.0 fL   MCH 30.9  26.0 - 34.0 pg   MCHC 34.4  30.0 - 36.0 g/dL   RDW 84.1  32.4 - 40.1 %   Platelets 224  150 - 400 K/uL  PROTIME-INR     Status: None   Collection Time    10/30/12  5:40 AM      Result Value Range   Prothrombin Time 13.1  11.6 - 15.2 seconds   INR 1.01  0.00 - 1.49  SEDIMENTATION RATE     Status: None   Collection Time    10/30/12  5:40 AM      Result Value Range   Sed Rate 5  0 - 22 mm/hr  C-REACTIVE PROTEIN     Status: Abnormal   Collection Time    10/30/12  5:40 AM      Result Value Range   CRP 1.6 (*) <0.60 mg/dL   Comment: Performed at [facility]  GLUCOSE, CAPILLARY     Status: None   Collection Time    10/30/12  7:32 AM      Result Value Range   Glucose-Capillary 90  70 - 99 mg/dL  CBC     Status: None   Collection Time    10/30/12  1:10 PM      Result Value Range   WBC 9.2  4.0 - 10.5 K/uL   RBC 4.84  3.87 - 5.11 MIL/uL   Hemoglobin 14.6  12.0 - 15.0 g/dL   HCT 02.7  25.3 - 66.4 %  MCV 90.1  78.0 - 100.0 fL   MCH 30.2  26.0 - 34.0 pg   MCHC 33.5  30.0 - 36.0 g/dL   RDW 16.1  09.6 -  04.5 %   Platelets 188  150 - 400 K/uL   Ct Abdomen Pelvis W Contrast  10/30/2012   CLINICAL DATA:  Right lower quadrant pain.  EXAM: CT ABDOMEN AND PELVIS WITH CONTRAST  TECHNIQUE: Multidetector CT imaging of the abdomen and pelvis was performed using the standard protocol following bolus administration of intravenous contrast.  CONTRAST:  50mL OMNIPAQUE IOHEXOL 300 MG/ML SOLN, OMNIPAQUE IOHEXOL 300 MG/ML SOLN  COMPARISON:  09/30/2011  FINDINGS: Lung bases are clear. No effusions. Heart is normal size.  Liver, gallbladder, pancreas, adrenals and kidneys are normal. Calcifications in the spleen compatible with old granulomatous disease.  Moderate stool in the right colon appendix is visualized and is normal. Small bowel is decompressed. There is apparent wall thickening involving the descending colon. Slight surrounding inflammatory change. This extends into the proximal sigmoid colon. Findings concerning for left-sided colitis, likely infectious or inflammatory. Mesenteric vessels are patent. Calcifications in the distal aorta and iliac vessels without aneurysm.  Uterus, urinary bladder, and adnexa are unremarkable. No free fluid, free air or adenopathy.  No acute bony abnormality.  IMPRESSION: Descending colonic and proximal sigmoid wall thickening and surrounding inflammation concerning for infectious or inflammatory colitis.  Moderate stool burden in the right colon.  Appendix normal.   Electronically Signed   By: Charlett Nose   On: 10/30/2012 01:32   Dg Chest Port 1 View  10/30/2012   *RADIOLOGY REPORT*  Clinical Data: Shortness of breath and cough  PORTABLE CHEST - 1 VIEW  Comparison: 10/13/2012 and prior chest radiographs dating back to 04/01/2007  Findings: The cardiomediastinal silhouette is unremarkable. COPD/emphysema again identified. There is no evidence of focal airspace disease, pulmonary edema, suspicious pulmonary nodule/mass, pleural effusion, or pneumothorax. No acute bony  abnormalities are identified.  IMPRESSION: COPD/emphysema without evidence of acute cardiopulmonary disease.   Original Report Authenticated By: Harmon Pier, M.D.   Review of Systems  Constitutional: Positive for fever, weight loss and malaise/fatigue. Negative for diaphoresis.  HENT: Negative for hearing loss, ear pain and tinnitus.   Eyes: Negative.   Cardiovascular: Negative.   Gastrointestinal: Positive for heartburn, nausea, vomiting, abdominal pain and constipation.  Genitourinary: Negative.   Skin: Negative.   Neurological: Positive for weakness and headaches.  Psychiatric/Behavioral: Positive for depression.   Blood pressure 101/54, pulse 92, temperature 97.4 F (36.3 C), temperature source Oral, resp. rate 18, height 5' 6.93" (1.7 m), weight 57.8 kg (127 lb 6.8 oz), SpO2 91.00%. Physical Exam  Constitutional: She is oriented to person, place, and time. She appears well-developed and well-nourished.  HENT:  Head: Normocephalic and atraumatic.  Eyes: Conjunctivae and EOM are normal. Pupils are equal, round, and reactive to light.  Neck: Normal range of motion. Neck supple.  Cardiovascular: Normal rate and regular rhythm.   Respiratory: Effort normal and breath sounds normal.  GI: Soft. Bowel sounds are normal. There is tenderness. There is guarding. There is no rebound.  Musculoskeletal: Normal range of motion.  Neurological: She is alert and oriented to person, place, and time.  Skin: Skin is warm and dry.  Psychiatric: She has a normal mood and affect. Her behavior is normal. Judgment and thought content normal.   Assessment/Plan: 1) Chronic constipation/RLQ pain/Colonic inertia: Will observe the patient for now as discussed with Dr. Janee Morn. If he continues to  have hematochezia and ongoing abdominal pain, a colonoscopy will be done.  2) Colitis on CT of unclear significance-will continue to monitor.  3) GERD.  4) Bipolar disorder/ PTSD. [name] 10/30/2012, 3:25 PM

## 2012-10-30 NOTE — Progress Notes (Signed)
ANTIBIOTIC CONSULT NOTE - INITIAL  Pharmacy Consult for Cipro Indication: Suspected infectious colitis/diverticulitis  Allergies  Allergen Reactions  . Oxycodone-Acetaminophen Anaphylaxis, Itching and Nausea And Vomiting    Tylox (Pt states, "I can only take Demerol, Oxycontin, and Dilaudid")  . Codeine Nausea And Vomiting  . Hydrocodone Nausea And Vomiting    Patient Measurements: Height: 5' 6.93" (170 cm) Weight: 127 lb 6.8 oz (57.8 kg) IBW/kg (Calculated) : 61.44  Vital Signs: Temp: 97.4 F (36.3 C) (08/31 0441) Temp src: Oral (08/31 0441) BP: 101/54 mmHg (08/31 0441) Pulse Rate: 92 (08/31 0441) Intake/Output from previous day:   Intake/Output from this shift:    Labs:  Recent Labs  10/29/12 2320 10/30/12  WBC 13.1*  --   HGB 16.1* 17.7*  PLT 246  --   CREATININE  --  0.80   Estimated Creatinine Clearance: 75.1 ml/min (by C-G formula based on Cr of 0.8). No results found for this basename: VANCOTROUGH, VANCOPEAK, VANCORANDOM, GENTTROUGH, GENTPEAK, GENTRANDOM, TOBRATROUGH, TOBRAPEAK, TOBRARND, AMIKACINPEAK, AMIKACINTROU, AMIKACIN,  in the last 72 hours   Microbiology: No results found for this or any previous visit (from the past 720 hour(s)).  Medical History: Past Medical History  Diagnosis Date  . TIA (transient ischemic attack)   . GERD (gastroesophageal reflux disease)   . Hepatomegaly     hx  . Colonic inertia   . Lung abscess     "calcified over"; bilateral   . COPD (chronic obstructive pulmonary disease)   . Stroke 2009    TIA  . PTSD (post-traumatic stress disorder)   . Bipolar disorder   . Depression   . Chronic headache   . Seizures     in past  . Fibromyalgia   . Iron deficiency anemia   . Arthritis     Medications:  Scheduled:  . ciprofloxacin  400 mg Intravenous Once  . fluticasone  2 spray Each Nare Daily  . metronidazole  500 mg Intravenous Q8H  . mometasone-formoterol  2 puff Inhalation BID  . ondansetron  4 mg Intravenous  Once  . pantoprazole (PROTONIX) IV  40 mg Intravenous Q12H  . sodium chloride  3 mL Intravenous Q12H  . tiotropium  18 mcg Inhalation Daily   Infusions:  . sodium chloride 100 mL/hr (10/30/12 0400)   Assessment:  52 yr female with complaints of worsening abdominal pain and blood clots in stool.  WBC = 13.1; Scr = 0.8 with CrCl ~ 75 ml/min; AFebrile  CT abdomen/pelvis = concerning for infectious or inflammatory colitis  IV Flagyl (per MD) and Cipro (per Rx) ordered  Goal of Therapy:  Eradication of infection  Plan:  Cipro 400mg  IV q12h  Carreen Milius, Joselyn Glassman, PharmD 10/30/2012,4:59 AM

## 2012-10-30 NOTE — Progress Notes (Signed)
I have seen and assessed patient and agree with Dr Eliane Decree assessment and plan. No further bleeding noted. No BM. GI consulted. Continue IVF, supportive care, follow H/H. Monitor.

## 2012-10-30 NOTE — ED Notes (Signed)
Report called to the floor, receiving Nurse responding the Code Blue in the same Unit. To transfer pt. Once receiving Nurse is available. Charge Nurse made aware.

## 2012-10-30 NOTE — Progress Notes (Signed)
Patient is up on floor. Alert and oriented times 4. Patient had stated down in ED that she was dizzy, but refused to use anything but the bathroom. Floor RN told patient she was a moderate fall risk and to call when she needed to get up, but patient is refusing the bed alarm. Will continue to monitor patient.

## 2012-10-30 NOTE — ED Notes (Signed)
Pt. Requesting pain medicine at this time, refused ordered pain medicine Morphine and preferred for Dilaudid Iv or Demerol IV. To notify the Hospitalist.

## 2012-10-31 DIAGNOSIS — K219 Gastro-esophageal reflux disease without esophagitis: Secondary | ICD-10-CM

## 2012-10-31 DIAGNOSIS — K921 Melena: Secondary | ICD-10-CM

## 2012-10-31 DIAGNOSIS — E43 Unspecified severe protein-calorie malnutrition: Secondary | ICD-10-CM | POA: Diagnosis present

## 2012-10-31 DIAGNOSIS — G8929 Other chronic pain: Secondary | ICD-10-CM

## 2012-10-31 LAB — CBC
HCT: 43.5 % (ref 36.0–46.0)
Hemoglobin: 14 g/dL (ref 12.0–15.0)
MCHC: 32.2 g/dL (ref 30.0–36.0)
RDW: 13.5 % (ref 11.5–15.5)
WBC: 7.5 10*3/uL (ref 4.0–10.5)

## 2012-10-31 LAB — BASIC METABOLIC PANEL
Chloride: 103 mEq/L (ref 96–112)
GFR calc Af Amer: >90 mL/min (ref >90)
GFR calc non Af Amer: >90 mL/min (ref >90)
Glucose, Bld: 111 mg/dL — ABNORMAL HIGH (ref 70–99)
Potassium: 3.8 mEq/L (ref 3.5–5.1)
Sodium: 141 mEq/L (ref 135–145)

## 2012-10-31 MED ORDER — PANTOPRAZOLE SODIUM 40 MG PO TBEC
40.0000 mg | DELAYED_RELEASE_TABLET | Freq: Every day | ORAL | Status: DC
  Administered 2012-10-31 – 2012-11-02 (×3): 40 mg via ORAL
  Filled 2012-10-31 (×5): qty 1

## 2012-10-31 MED ORDER — PEG 3350-KCL-NA BICARB-NACL 420 G PO SOLR
4000.0000 mL | Freq: Once | ORAL | Status: AC
  Administered 2012-10-31: 17:00:00 4000 mL via ORAL

## 2012-10-31 NOTE — Progress Notes (Signed)
Pt had apparently returned to unit and was found in the waiting area. Pt made aware that she is unable to leave the unit without a MD order and especially without letting the RN know. Julio Sicks RN

## 2012-10-31 NOTE — Progress Notes (Signed)
Pt has been ambulating in hallways with her husband. Went to check on pt, unable to find. Searched this unit, outside main entrance, cafeteria and education entrance. Rechecked unit, still unable to find. Optician, dispensing. Julio Sicks RN

## 2012-10-31 NOTE — Progress Notes (Signed)
Called security to check on status of finding pt. Security unable to find pt. MD made aware. Pt's belongings still in room (cell phone, comb, lotion, etc...). Will continue to look for pt. Julio Sicks RN

## 2012-10-31 NOTE — Progress Notes (Signed)
Gave patient chamber for her Dulera MDI.

## 2012-10-31 NOTE — Progress Notes (Signed)
[facility] PROGRESS NOTE  [name] ZOX:[id number] DOB: October 18, 1960 DOA: 10/29/2012 PCP: Kaleen Mask, MD  Assessment/Plan: #1 hematochezia/rectal bleeding with abnormal changes in the descending colon on admission noted on CT scan/Colitis Questionable etiology. Differential includes ischemic colitis. Patient with no diarrhea. Patient with normal white count. Doubt infectious colitis. IV antibiotics have been discontinued. Patient's hemoglobin is stable. Blood pressure is stable. Patient has been seen by gastroenterology and patient for colonoscopy tomorrow for further evaluation. Per GI.  #2 chronic abdominal pain Patient with history of colonic inertia. Outpatient followup. Continue pain management.  #3 history of COPD Stable. Nebs as needed.  #4 anxiety Continue anxiolytics.  #5 gastroesophageal reflux disease PPI.  #6 history of TIA Aspirin on hold secondary to problem #1. GI to advise on aspirin can be resumed.  #7 prophylaxis PPI for GI prophylaxis. SCDs for DVT prophylaxis.  Code Status: Full. Family Communication: Updated patient and husband Disposition Plan: Home when medically stable.   Consultants:  GI: Dr Loreta Ave 10/30/12  Procedures:  CT abd/pelvis 10/30/12  Antibiotics:  IV Cipro 8/31--->10/31/12  IV Flagyl 8/31---> 10/31/12  HPI/Subjective: Patient states small drop of blood today, no BM. Patient still c/o abdominal pain.  Objective: Filed Vitals:   10/31/12 1345  BP: 126/64  Pulse: 90  Temp: 97.2 F (36.2 C)  Resp: 20    Intake/Output Summary (Last 24 hours) at 10/31/12 1526 Last data filed at 10/31/12 0930  Gross per 24 hour  Intake   2040 ml  Output    500 ml  Net   1540 ml   Filed Weights   10/30/12 0457  Weight: 57.8 kg (127 lb 6.8 oz)    Exam:   General:  NAD  Cardiovascular: RRR  Respiratory: CTAB  Abdomen: Soft/diffuse TTP/ND/+BS  Musculoskeletal: 4/5 BUE strength, 4/5 BLE strength   Data  Reviewed: Basic Metabolic Panel:  Recent Labs Lab 10/30/12 10/30/12 0540 10/31/12 0445  NA 139 135 141  K 3.8 3.8 3.8  CL 100 98 103  CO2  --  30 31  GLUCOSE 87 109* 111*  BUN 5* 6 4*  CREATININE 0.80 0.62 0.54  CALCIUM  --  9.5 9.0   Liver Function Tests:  Recent Labs Lab 10/30/12 0540  AST 15  ALT 10  ALKPHOS 75  BILITOT 0.6  PROT 6.7  ALBUMIN 3.4*   No results found for this basename: LIPASE, AMYLASE,  in the last 168 hours No results found for this basename: AMMONIA,  in the last 168 hours CBC:  Recent Labs Lab 10/29/12 2320 10/30/12 10/30/12 0540 10/30/12 1310 10/30/12 2109 10/31/12 0445  WBC 13.1*  --  12.1* 9.2 7.3 7.5  NEUTROABS 8.0*  --   --   --   --   --   HGB 16.1* 17.7* 15.2* 14.6 13.7 14.0  HCT 48.0* 52.0* 44.2 43.6 41.7 43.5  MCV 89.9  --  89.8 90.1 91.2 91.6  PLT 246  --  224 188 217 221   Cardiac Enzymes: No results found for this basename: CKTOTAL, CKMB, CKMBINDEX, TROPONINI,  in the last 168 hours BNP (last 3 results) No results found for this basename: PROBNP,  in the last 8760 hours CBG:  Recent Labs Lab 10/30/12 0732 10/31/12 0712  GLUCAP 90 93    No results found for this or any previous visit (from the past 240 hour(s)).   Studies: Ct Abdomen Pelvis W Contrast  10/30/2012   CLINICAL DATA:  Right lower quadrant pain.  EXAM:  CT ABDOMEN AND PELVIS WITH CONTRAST  TECHNIQUE: Multidetector CT imaging of the abdomen and pelvis was performed using the standard protocol following bolus administration of intravenous contrast.  CONTRAST:  50mL OMNIPAQUE IOHEXOL 300 MG/ML SOLN, OMNIPAQUE IOHEXOL 300 MG/ML SOLN  COMPARISON:  09/30/2011  FINDINGS: Lung bases are clear. No effusions. Heart is normal size.  Liver, gallbladder, pancreas, adrenals and kidneys are normal. Calcifications in the spleen compatible with old granulomatous disease.  Moderate stool in the right colon appendix is visualized and is normal. Small bowel is decompressed.  There is apparent wall thickening involving the descending colon. Slight surrounding inflammatory change. This extends into the proximal sigmoid colon. Findings concerning for left-sided colitis, likely infectious or inflammatory. Mesenteric vessels are patent. Calcifications in the distal aorta and iliac vessels without aneurysm.  Uterus, urinary bladder, and adnexa are unremarkable. No free fluid, free air or adenopathy.  No acute bony abnormality.  IMPRESSION: Descending colonic and proximal sigmoid wall thickening and surrounding inflammation concerning for infectious or inflammatory colitis.  Moderate stool burden in the right colon.  Appendix normal.   Electronically Signed   By: Charlett Nose   On: 10/30/2012 01:32   Dg Chest Port 1 View  10/30/2012   *RADIOLOGY REPORT*  Clinical Data: Shortness of breath and cough  PORTABLE CHEST - 1 VIEW  Comparison: 10/13/2012 and prior chest radiographs dating back to 04/01/2007  Findings: The cardiomediastinal silhouette is unremarkable. COPD/emphysema again identified. There is no evidence of focal airspace disease, pulmonary edema, suspicious pulmonary nodule/mass, pleural effusion, or pneumothorax. No acute bony abnormalities are identified.  IMPRESSION: COPD/emphysema without evidence of acute cardiopulmonary disease.   Original Report Authenticated By: Harmon Pier, M.D.    Scheduled Meds: . fluticasone  2 spray Each Nare Daily  . lactose free nutrition  237 mL Oral TID WC  . loratadine  10 mg Oral Daily  . mometasone-formoterol  2 puff Inhalation BID  . nicotine  14 mg Transdermal Daily  . ondansetron  4 mg Intravenous Once  . pantoprazole  40 mg Oral Q0600  . polyethylene glycol-electrolytes  4,000 mL Oral Once  . sodium chloride  3 mL Intravenous Q12H  . tiotropium  18 mcg Inhalation Daily   Continuous Infusions:   Principal Problem:   Hematochezia Active Problems:   C O P D   GERD   BIPOLAR DISORDER UNSPECIFIED   Chronic abdominal pain    Chronic constipation   Chronic nausea   Abdominal pain   Colitis   Protein-calorie malnutrition, severe    Time spent: > 35 mins    [facility]  [facility] Pager [phone]. If 7PM-7AM, please contact night-coverage at [email or web address], password [facility] 10/31/2012, 3:26 PM  LOS: 2 days

## 2012-10-31 NOTE — Progress Notes (Signed)
Cross cover [facility]  Subjective: Since I last evaluated the patient, not much has changed. She has one small pellet like BM today with a small amount of blood on it. She is very upset that the stool sample was not saved to be inspected by me. She is not happy with her care and claims that all these monitors are driving her crazy. She wants all the monitors removed. She wants to go home if this is not done. Her abdominal pain has completely resolved. She insists "that a piece of her colon does not work properly". She has been on a liquid diet and claims she will never have a BM unless she gets "real food".    Objective: Vital signs in last 24 hours: Temp:  [97.5 F (36.4 C)-98.2 F (36.8 C)] 97.5 F (36.4 C) (09/01 0449) Pulse Rate:  [89-96] 89 (09/01 0449) Resp:  [18] 18 (09/01 0449) BP: (103-121)/(41-53) 119/53 mmHg (09/01 0449) SpO2:  [94 %-100 %] 96 % (09/01 0802) Last BM Date: 10/29/12  Intake/Output from previous day: 08/31 0701 - 09/01 0700 In: 2040 [P.O.:240; I.V.:1500; IV Piggyback:300] Out: 500 [Urine:500] Intake/Output this shift:   General appearance: alert, cooperative, appears stated age and no distress Resp: clear to auscultation bilaterally Cardio: regular rate and rhythm, S1, S2 normal, no murmur, click, rub or gallop GI: soft, non-tender; bowel sounds normal; no masses,  no organomegaly Extremities: extremities normal, atraumatic, no cyanosis or edema  Lab Results:  Recent Labs  10/30/12 1310 10/30/12 2109 10/31/12 0445  WBC 9.2 7.3 7.5  HGB 14.6 13.7 14.0  HCT 43.6 41.7 43.5  PLT 188 217 221   BMET  Recent Labs  10/30/12 10/30/12 0540 10/31/12 0445  NA 139 135 141  K 3.8 3.8 3.8  CL 100 98 103  CO2  --  30 31  GLUCOSE 87 109* 111*  BUN 5* 6 4*  CREATININE 0.80 0.62 0.54  CALCIUM  --  9.5 9.0   LFT  Recent Labs  10/30/12 0540  PROT 6.7  ALBUMIN 3.4*  AST 15  ALT 10  ALKPHOS 75  BILITOT 0.6   PT/INR  Recent Labs  10/29/12 2333  10/30/12 0540  LABPROT 12.8 13.1  INR 0.98 1.01   Studies/Results: Ct Abdomen Pelvis W Contrast  10/30/2012   CLINICAL DATA:  Right lower quadrant pain.  EXAM: CT ABDOMEN AND PELVIS WITH CONTRAST  TECHNIQUE: Multidetector CT imaging of the abdomen and pelvis was performed using the standard protocol following bolus administration of intravenous contrast.  CONTRAST:  50mL OMNIPAQUE IOHEXOL 300 MG/ML SOLN, OMNIPAQUE IOHEXOL 300 MG/ML SOLN  COMPARISON:  09/30/2011  FINDINGS: Lung bases are clear. No effusions. Heart is normal size.  Liver, gallbladder, pancreas, adrenals and kidneys are normal. Calcifications in the spleen compatible with old granulomatous disease.  Moderate stool in the right colon appendix is visualized and is normal. Small bowel is decompressed. There is apparent wall thickening involving the descending colon. Slight surrounding inflammatory change. This extends into the proximal sigmoid colon. Findings concerning for left-sided colitis, likely infectious or inflammatory. Mesenteric vessels are patent. Calcifications in the distal aorta and iliac vessels without aneurysm.  Uterus, urinary bladder, and adnexa are unremarkable. No free fluid, free air or adenopathy.  No acute bony abnormality.  IMPRESSION: Descending colonic and proximal sigmoid wall thickening and surrounding inflammation concerning for infectious or inflammatory colitis.  Moderate stool burden in the right colon.  Appendix normal.   Electronically Signed   By: Charlett Nose  On: 10/30/2012 01:32   Dg Chest Port 1 View  10/30/2012   *RADIOLOGY REPORT*  Clinical Data: Shortness of breath and cough  PORTABLE CHEST - 1 VIEW  Comparison: 10/13/2012 and prior chest radiographs dating back to 04/01/2007  Findings: The cardiomediastinal silhouette is unremarkable. COPD/emphysema again identified. There is no evidence of focal airspace disease, pulmonary edema, suspicious pulmonary nodule/mass, pleural effusion, or  pneumothorax. No acute bony abnormalities are identified.  IMPRESSION: COPD/emphysema without evidence of acute cardiopulmonary disease.   Original Report Authenticated By: Harmon Pier, M.D.   Medications: I have reviewed the patient's current medications.  Assessment/Plan: 1) Rectal bleeding with abnormal changes in the descending colon on admission ?ischemic colitis-also has a history of chronic constipation/diagnosed with colonic inertia by Dr. Juanda Chance on her last colonoscopy in 2011. Will discontinue antibiotics and IV fluids for now and prep for a colonoscopy tomorrow. She may have ischemic colitis. She has agreed to stay one more day to have a colonosocopy prior to discharge. 2) Bipolar disorder/PSTD.  3) COPD.  LOS: 2 days   [name] 10/31/2012, 12:39 PM

## 2012-11-01 ENCOUNTER — Encounter (HOSPITAL_COMMUNITY): Payer: Self-pay | Admitting: *Deleted

## 2012-11-01 ENCOUNTER — Encounter (HOSPITAL_COMMUNITY)
Admission: EM | Disposition: A | Discharge status: Home / Self Care | Payer: Self-pay | Source: Home / Self Care | Attending: Internal Medicine

## 2012-11-01 DIAGNOSIS — K5900 Constipation, unspecified: Secondary | ICD-10-CM

## 2012-11-01 DIAGNOSIS — K55039 Acute (reversible) ischemia of large intestine, extent unspecified: Secondary | ICD-10-CM

## 2012-11-01 HISTORY — DX: Acute (reversible) ischemia of large intestine, extent unspecified: K55.039

## 2012-11-01 HISTORY — PX: COLONOSCOPY: SHX5424

## 2012-11-01 LAB — CBC
Hemoglobin: 13.3 g/dL (ref 12.0–15.0)
Platelets: 234 10*3/uL (ref 150–400)
RBC: 4.55 MIL/uL (ref 3.87–5.11)
WBC: 6.5 10*3/uL (ref 4.0–10.5)

## 2012-11-01 LAB — BASIC METABOLIC PANEL
CO2: 34 mEq/L — ABNORMAL HIGH (ref 19–32)
Chloride: 103 mEq/L (ref 96–112)
Glucose, Bld: 106 mg/dL — ABNORMAL HIGH (ref 70–99)
Sodium: 141 mEq/L (ref 135–145)

## 2012-11-01 SURGERY — COLONOSCOPY
Anesthesia: Moderate Sedation

## 2012-11-01 MED ORDER — MIDAZOLAM HCL 5 MG/5ML IJ SOLN
INTRAMUSCULAR | Status: DC | PRN
  Administered 2012-11-01 (×2): 1 mg via INTRAVENOUS
  Administered 2012-11-01 (×2): 2 mg via INTRAVENOUS

## 2012-11-01 MED ORDER — DIPHENHYDRAMINE HCL 50 MG/ML IJ SOLN
INTRAMUSCULAR | Status: DC | PRN
  Administered 2012-11-01: 25 mg via INTRAVENOUS

## 2012-11-01 MED ORDER — DIPHENHYDRAMINE HCL 50 MG/ML IJ SOLN
INTRAMUSCULAR | Status: AC
  Filled 2012-11-01: qty 1

## 2012-11-01 MED ORDER — DICYCLOMINE HCL 20 MG PO TABS
20.0000 mg | ORAL_TABLET | Freq: Three times a day (TID) | ORAL | Status: DC
  Administered 2012-11-01 – 2012-11-02 (×3): 20 mg via ORAL
  Filled 2012-11-01 (×6): qty 1

## 2012-11-01 MED ORDER — FENTANYL CITRATE 0.05 MG/ML IJ SOLN
INTRAMUSCULAR | Status: DC | PRN
  Administered 2012-11-01 (×2): 25 ug via INTRAVENOUS

## 2012-11-01 MED ORDER — FENTANYL CITRATE 0.05 MG/ML IJ SOLN
INTRAMUSCULAR | Status: AC
  Filled 2012-11-01: qty 2

## 2012-11-01 MED ORDER — MIDAZOLAM HCL 10 MG/2ML IJ SOLN
INTRAMUSCULAR | Status: AC
  Filled 2012-11-01: qty 2

## 2012-11-01 MED ORDER — SODIUM CHLORIDE 0.9 % IV SOLN
INTRAVENOUS | Status: DC
  Administered 2012-11-01: 500 mL via INTRAVENOUS

## 2012-11-01 NOTE — Progress Notes (Signed)
Patient had 1 very large, dark brown BM this AM. [place]

## 2012-11-01 NOTE — Care Management Note (Addendum)
    Page 1 of 1   11/02/2012     2:06:41 PM   CARE MANAGEMENT NOTE 11/02/2012  Patient:  [name], [name]   Account Number:  [id number]  Date Initiated:  10/30/2012  Documentation initiated by:  [facility]  Subjective/Objective Assessment:   52 year old female admitted with abdominal pain, nausea, vomiting and and blood in stool.     Action/Plan:   From home.   Anticipated DC Date:  11/02/2012   Anticipated DC Plan:  HOME/SELF CARE      DC Planning Services  CM consult      Choice offered to / List presented to:             Status of service:  Completed, signed off Medicare Important Message given?  NA - LOS <3 / Initial given by admissions (If response is "NO", the following Medicare IM given date fields will be blank) Date Medicare IM given:   Date Additional Medicare IM given:    Discharge Disposition:  HOME/SELF CARE  Per UR Regulation:  Reviewed for med. necessity/level of care/duration of stay  If discussed at Long Length of Stay Meetings, dates discussed:    Comments:  11/02/12 Jeyson Deshotel RN,BSN NCM [phone] D/C HOME NO NEEDS OR ORDERS.  11/01/12 Madsen Riddle RN,BSN NCM [phone] COLONOSCOPY-NOTED RESULTS.NO ANTICIPATED D/C NEEDS.

## 2012-11-01 NOTE — Progress Notes (Signed)
[facility] PROGRESS NOTE  [name] ZOX:[id number] DOB: Aug 27, 1960 DOA: 10/29/2012 PCP: Kaleen Mask, MD  Assessment/Plan: #1 hematochezia/rectal bleeding with abnormal changes in the descending colon on admission noted on CT scan/ Ischemic Colitis Questionable etiology. Likely secondary ischemic colitis. Per colonoscopy non bleeding mucosal ulceration in descending colon at splenic flexure. Biopsies pending. Patient with no diarrhea. Patient with normal white count. Doubt infectious colitis. IV antibiotics have been discontinued. Patient's hemoglobin is stable. Dicyclomine added per GI. Gi ff and appreciate input and rxcs.  #2 chronic abdominal pain Patient with history of colonic inertia. Outpatient followup. Continue pain management.  #3 history of COPD Stable. Nebs as needed.  #4 anxiety Continue anxiolytics.  #5 gastroesophageal reflux disease PPI.  #6 history of TIA Aspirin on hold secondary to problem #1. GI to advise on aspirin can be resumed.  #7 Chronic constipation Patient with hx of colonic inertia. Per GI.  #8 prophylaxis PPI for GI prophylaxis. SCDs for DVT prophylaxis.  Code Status: Full. Family Communication: Updated patient at bedside. Disposition Plan: Home when medically stable hopefully tomm.   Consultants:  GI: Dr Loreta Ave 10/30/12  Procedures:  CT abd/pelvis 10/30/12  Colonoscopy 11/01/12 Dr Leone Payor  Antibiotics:  IV Cipro 8/31--->10/31/12  IV Flagyl 8/31---> 10/31/12  HPI/Subjective: Patient sleeping, post colonoscopy.  Objective: Filed Vitals:   11/01/12 1526  BP: 106/53  Pulse: 65  Temp: 97.6 F (36.4 C)  Resp: 18    Intake/Output Summary (Last 24 hours) at 11/01/12 1746 Last data filed at 11/01/12 0300  Gross per 24 hour  Intake      0 ml  Output    300 ml  Net   -300 ml   Filed Weights   10/30/12 0457  Weight: 57.8 kg (127 lb 6.8 oz)    Exam:   General:  NAD  Cardiovascular: RRR  Respiratory:  CTAB  Abdomen: Soft/diffuse TTP/ND/+BS  Musculoskeletal: 4/5 BUE strength, 4/5 BLE strength   Data Reviewed: Basic Metabolic Panel:  Recent Labs Lab 10/30/12 10/30/12 0540 10/31/12 0445 11/01/12 0346  NA 139 135 141 141  K 3.8 3.8 3.8 4.0  CL 100 98 103 103  CO2  --  30 31 34*  GLUCOSE 87 109* 111* 106*  BUN 5* 6 4* 5*  CREATININE 0.80 0.62 0.54 0.57  CALCIUM  --  9.5 9.0 9.2   Liver Function Tests:  Recent Labs Lab 10/30/12 0540  AST 15  ALT 10  ALKPHOS 75  BILITOT 0.6  PROT 6.7  ALBUMIN 3.4*   No results found for this basename: LIPASE, AMYLASE,  in the last 168 hours No results found for this basename: AMMONIA,  in the last 168 hours CBC:  Recent Labs Lab 10/29/12 2320  10/30/12 0540 10/30/12 1310 10/30/12 2109 10/31/12 0445 11/01/12 0346  WBC 13.1*  --  12.1* 9.2 7.3 7.5 6.5  NEUTROABS 8.0*  --   --   --   --   --   --   HGB 16.1*  < > 15.2* 14.6 13.7 14.0 13.3  HCT 48.0*  < > 44.2 43.6 41.7 43.5 41.5  MCV 89.9  --  89.8 90.1 91.2 91.6 91.2  PLT 246  --  224 188 217 221 234  < > = values in this interval not displayed. Cardiac Enzymes: No results found for this basename: CKTOTAL, CKMB, CKMBINDEX, TROPONINI,  in the last 168 hours BNP (last 3 results) No results found for this basename: PROBNP,  in the last 8760 hours  CBG:  Recent Labs Lab 10/30/12 0732 10/31/12 0712 11/01/12 0813  GLUCAP 90 93 101*    No results found for this or any previous visit (from the past 240 hour(s)).   Studies: No results found.  Scheduled Meds: . dicyclomine  20 mg Oral TID AC  . fluticasone  2 spray Each Nare Daily  . lactose free nutrition  237 mL Oral TID WC  . loratadine  10 mg Oral Daily  . mometasone-formoterol  2 puff Inhalation BID  . nicotine  14 mg Transdermal Daily  . ondansetron  4 mg Intravenous Once  . pantoprazole  40 mg Oral Q0600  . sodium chloride  3 mL Intravenous Q12H  . tiotropium  18 mcg Inhalation Daily   Continuous Infusions:    Principal Problem:   Hematochezia Active Problems:   C O P D   GERD   BIPOLAR DISORDER UNSPECIFIED   Chronic abdominal pain   Chronic constipation   Chronic nausea   Abdominal pain   Colitis   Protein-calorie malnutrition, severe   Acute ischemic colitis    Time spent: > 35 mins    [facility]  [facility] Pager [phone]. If 7PM-7AM, please contact night-coverage at [email or web address], password [facility] 11/01/2012, 5:46 PM  LOS: 3 days

## 2012-11-01 NOTE — Op Note (Signed)
[facility] [address] [place], [address]   FLEXIBLE SIGMOIDOSCOPY PROCEDURE REPORT  PATIENT: Bethany, [name]  MR#: [id number] BIRTHDATE: 1961-01-01 , 52  yrs. old GENDER: Female ENDOSCOPIST: Iva Boop, MD, [facility] PROCEDURE DATE:  11/01/2012 PROCEDURE:   Sigmoidoscopy with biopsy ASA CLASS:   Class II INDICATIONS:hematochezia. MEDICATIONS: Benadryl 25 mg IV, Fentanyl 50 mcg IV, and Versed 7 mg IV  DESCRIPTION OF PROCEDURE:   After the risks benefits and alternatives of the procedure were thoroughly explained, informed consent was obtained.  revealed no abnormalities of the rectum. The endoscope was introduced through the anus  and advanced to the splenic flexure , limited by No adverse events experienced.   The quality of the prep was adequate .  The instrument was then slowly withdrawn as the mucosa was fully examined.     1. COLON FINDINGS: Non-bleeding mucosal ulceration, intermittent across the area examined, was present in the descending colon and at the splenic flexure.  Multiple biopsies were performed using cold forceps. 2.  The colon mucosa was otherwise normal. Retroflexed views revealed internal hemorrhoids.    The scope was then withdrawn from the patient and the procedure terminated.  COMPLICATIONS: There were no complications.  ENDOSCOPIC IMPRESSION: 1.   Non-bleeding mucosal ulceration in the descending colon and at the splenic flexure; multiple biopsies were performed using cold forceps  - looks like ischemic colitis 2.   The colon mucosa was otherwise normal  RECOMMENDATIONS: advance diet add dicyclomine qac review laxative regimen tomorrow before dc - has chronic constipation w/ ? of colonic inertia but does take narcotics at home prn and prior sitz marks study may show most markers in left colon - she has been referred to Dr.  Dorita Fray at [facility] but not clear she made it there home soon (tomorrow should be possible) w/ f/u in  our clinic (Dr. Juanda Chance is primary GI MD)  eSigned:  Iva Boop, MD, [facility] 11/01/2012 1:27 PM  CC: [name], MD

## 2012-11-01 NOTE — Progress Notes (Signed)
Patient drank colonoscopy prep over several hours last night, but finished it completely.  Having brown liquid stool.  Says that she has long-standing constipation at baseline.  Area that needs evaluated is descending colon and proximal sigmoid colon.  Will give tap water enemas for further preparation.

## 2012-11-01 NOTE — Progress Notes (Signed)
Patient was able to drank all but small amount of golytely prep. Pt has not had stools since beginning prep. Dr. Arlyce Dice notified of this.  Instructed to give it a few more hours to see if patient will have results. Will continue to monitor. [facility]

## 2012-11-02 ENCOUNTER — Encounter (HOSPITAL_COMMUNITY): Payer: Self-pay | Admitting: Internal Medicine

## 2012-11-02 ENCOUNTER — Encounter: Payer: Self-pay | Admitting: Internal Medicine

## 2012-11-02 DIAGNOSIS — K55059 Acute (reversible) ischemia of intestine, part and extent unspecified: Principal | ICD-10-CM

## 2012-11-02 LAB — BASIC METABOLIC PANEL
BUN: 7 mg/dL (ref 6–23)
CO2: 31 mEq/L (ref 19–32)
Calcium: 9.3 mg/dL (ref 8.4–10.5)
Creatinine, Ser: 0.63 mg/dL (ref 0.50–1.10)
Glucose, Bld: 119 mg/dL — ABNORMAL HIGH (ref 70–99)

## 2012-11-02 LAB — CBC
Hemoglobin: 13.7 g/dL (ref 12.0–15.0)
MCH: 29.5 pg (ref 26.0–34.0)
MCV: 91.8 fL (ref 78.0–100.0)
RBC: 4.65 MIL/uL (ref 3.87–5.11)

## 2012-11-02 MED ORDER — OXYCODONE HCL 10 MG PO TB12: 10.0000 mg | ORAL_TABLET | Freq: Two times a day (BID) | ORAL | Status: DC | PRN

## 2012-11-02 MED ORDER — NICOTINE 14 MG/24HR TD PT24: 1.0000 | MEDICATED_PATCH | Freq: Every day | TRANSDERMAL | Status: DC

## 2012-11-02 MED ORDER — DICYCLOMINE HCL 20 MG PO TABS: 20.0000 mg | ORAL_TABLET | Freq: Three times a day (TID) | ORAL | Status: DC

## 2012-11-02 MED ORDER — HYDROMORPHONE HCL 2 MG PO TABS: 2.0000 mg | ORAL_TABLET | ORAL | Status: DC | PRN

## 2012-11-02 MED ORDER — DIAZEPAM 10 MG PO TABS: 10.0000 mg | ORAL_TABLET | Freq: Two times a day (BID) | ORAL | Status: DC | PRN

## 2012-11-02 MED ORDER — PANTOPRAZOLE SODIUM 40 MG PO TBEC: 40.0000 mg | DELAYED_RELEASE_TABLET | Freq: Every day | ORAL | Status: DC

## 2012-11-02 MED ORDER — HYDROMORPHONE HCL 2 MG PO TABS
2.0000 mg | ORAL_TABLET | Freq: Once | ORAL | Status: AC
  Administered 2012-11-02: 13:00:00 2 mg via ORAL
  Filled 2012-11-02: qty 1

## 2012-11-02 MED ORDER — PROMETHAZINE HCL 25 MG PO TABS: 12.5000 mg | ORAL_TABLET | Freq: Four times a day (QID) | ORAL | Status: DC | PRN

## 2012-11-02 NOTE — Discharge Summary (Signed)
Physician Discharge Summary  [name] ZOX:[id number] DOB: 1960-04-17 DOA: 10/29/2012  PCP: Kaleen Mask, MD  Admit date: 10/29/2012 Discharge date: 11/02/2012  Recommendations for Outpatient Follow-up:  1. Pt will need to follow up with PCP in 2-3 weeks post discharge 2. Please obtain BMP to evaluate electrolytes and kidney function 3. Please also check CBC to evaluate Hg and Hct levels  Discharge Diagnoses:  Principal Problem:   Hematochezia Active Problems:   C O P D   GERD   BIPOLAR DISORDER UNSPECIFIED   Chronic abdominal pain   Chronic constipation   Chronic nausea   Abdominal pain   Colitis   Protein-calorie malnutrition, severe   Acute ischemic colitis  Discharge Condition: Stable  Diet recommendation: Heart healthy diet discussed in details   History of present illness:  52 y.o. female with Past medical history of colonic inertia, GERD, iron deficiency anemia, recent admission for TIA.  Today she presented with the complaint of abdominal pain that started yesterday located on the right side felt like a continuous sharp pain. Followed by nausea and vomiting without any blood. This morning when she woke up and went for urination she had bright red blood per rectum, she continues to have similar episodes 4 times at home with dark maroon blood. When she was in the ER she had another episode. She had a colonoscopy done with Dr. Melina Fiddler 3 years ago which did not show diverticulosis, or any mucosal abnormality.  She mentions that she had a fever of 101 and has taken Tylenol before arrival.  She denies any complaint of chest pain, but have some epigastric discomfort and heartburn.  Denies any shortness of breath headache focal neurological deficit.   Hospital Course:  Principal Problem:   Hematochezia - secondary to non bleeding mucosal ulcerations as seen on endoscopy 11/01/2012 - this was determined to be most likely secondary to ischemic colitis - dietary  recommendations provided - also discussed low fiber diet in detail and follow up with GI specialist  Active Problems:   C O P D - clinically stable while inpatient - pt maintaining oxygen saturations at target range    GERD - continue Protonix    BIPOLAR DISORDER UNSPECIFIED - stable while inpatient    Chronic abdominal pain - secondary to ischemic colitis - controlled on current medication Dilaudid    Chronic constipation - bowel regimen discussed and dietary recommendations provided    Colitis, acute and ischemic  - self limiting, discussed in detail    Protein-calorie malnutrition, severe - supplementation discussed and encouraged PO intake as pt able to tolearte   Procedures/Studies: Dg Chest 2 View  10/13/2012  Chronic changes of COPD redemonstrated, as above, without radiographic evidence of acute cardiopulmonary disease.   Ct Head Wo Contrast  10/13/2012  No acute intracranial abnormality.    Mri Brain Without Contrast  10/13/2012   Negative exam.  MRA HEAD  Negative exam.   Ct Abdomen Pelvis W Contrast   10/30/2012  Descending colonic and proximal sigmoid wall thickening and surrounding inflammation concerning for infectious or inflammatory colitis.  Moderate stool burden in the right colon. 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     Consultations:  GI  Antibiotics:  None  Discharge Exam: Filed Vitals:   11/02/12 0532  BP: 120/66  Pulse: 93  Temp: 97.8 F (36.6 C)  Resp: 20   Filed Vitals:   11/01/12 1526 11/01/12 2131 11/02/12 0532 11/02/12  0836  BP: 106/53 145/69 120/66   Pulse: 65 108 93   Temp: 97.6 F (36.4 C) 97.9 F (36.6 C) 97.8 F (36.6 C)   TempSrc: Oral Axillary Axillary   Resp: 18 22 20    Height:      Weight:      SpO2: 98% 90% 100% 94%    General: Pt is alert, follows commands appropriately, not in acute distress Cardiovascular: Regular rate and rhythm, S1/S2 +, no murmurs, no  rubs, no gallops Respiratory: Clear to auscultation bilaterally, no wheezing, no crackles, no rhonchi Abdominal: Soft, non tender, non distended, bowel sounds +, no guarding Extremities: no edema, no cyanosis, pulses palpable bilaterally DP and PT Neuro: Grossly nonfocal  Discharge Instructions  Discharge Orders   Future Appointments Provider Department Dept Phone   01/10/2013 3:30 PM Barbaraann Share, MD [facility] [phone]   Future Orders Complete By Expires   Diet - low sodium heart healthy  As directed    Increase activity slowly  As directed        Medication List    STOP taking these medications       lactulose 10 GM/15ML solution  Commonly known as:  CHRONULAC     omeprazole 20 MG capsule  Commonly known as:  PRILOSEC      TAKE these medications       aspirin EC 81 MG tablet  Take 1 tablet (81 mg total) by mouth daily.     cyclobenzaprine 5 MG tablet  Commonly known as:  FLEXERIL  Take 1 tablet (5 mg total) by mouth 3 (three) times daily as needed for muscle spasms.     diazepam 10 MG tablet  Commonly known as:  VALIUM  Take 1 tablet (10 mg total) by mouth every 12 (twelve) hours as needed for anxiety.     dicyclomine 20 MG tablet  Commonly known as:  BENTYL  Take 1 tablet (20 mg total) by mouth 3 (three) times daily before meals.     Fluticasone-Salmeterol 250-50 MCG/DOSE Aepb  Commonly known as:  ADVAIR  Inhale 1 puff into the lungs every 12 (twelve) hours.     loratadine 10 MG tablet  Commonly known as:  CLARITIN  Take 10 mg by mouth every morning.     mometasone 50 MCG/ACT nasal spray  Commonly known as:  NASONEX  Place 2 sprays into the nose 2 (two) times daily.     nicotine 14 mg/24hr patch  Commonly known as:  NICODERM CQ - dosed in mg/24 hours  Place 1 patch onto the skin daily.     oxyCODONE 10 MG 12 hr tablet  Commonly known as:  OXYCONTIN  Take 1 tablet (10 mg total) by mouth every 12 (twelve) hours as needed. For pain      pantoprazole 40 MG tablet  Commonly known as:  PROTONIX  Take 1 tablet (40 mg total) by mouth daily at 6 (six) AM.     polyethylene glycol packet  Commonly known as:  MIRALAX / GLYCOLAX  Take 17 g by mouth daily as needed (constipation).     prochlorperazine 10 MG tablet  Commonly known as:  COMPAZINE  Take 10 mg by mouth every 6 (six) hours as needed (nausea).     promethazine 25 MG tablet  Commonly known as:  PHENERGAN  Take 0.5-1 tablets (12.5-25 mg total) by mouth every 6 (six) hours as needed for nausea.     tiotropium 18 MCG inhalation capsule  Commonly  known as:  SPIRIVA HANDIHALER  Place 1 capsule (18 mcg total) into inhaler and inhale daily.     VENTOLIN HFA 108 (90 BASE) MCG/ACT inhaler  Generic drug:  albuterol  Inhale 2 puffs into the lungs every 6 (six) hours as needed for wheezing or shortness of breath.     albuterol (2.5 MG/3ML) 0.083% nebulizer solution  Commonly known as:  PROVENTIL  Take 2.5 mg by nebulization every 6 (six) hours as needed for shortness of breath. For shortness of breath           Follow-up Information   Follow up with Kaleen Mask, MD In 2 weeks.   Specialty:  Family Medicine   Contact information:   [address] [place] [address] [phone]       Follow up with [name], MD In 4 weeks.   Specialty:  Gastroenterology   Contact information:   [address] [place] [address] [phone]        The results of significant diagnostics from this hospitalization (including imaging, microbiology, ancillary and laboratory) are listed below for reference.     Microbiology: No results found for this or any previous visit (from the past 240 hour(s)).   Labs: Basic Metabolic Panel:  Recent Labs Lab 10/30/12 10/30/12 0540 10/31/12 0445 11/01/12 0346 11/02/12 0433  NA 139 135 141 141 140  K 3.8 3.8 3.8 4.0 3.7  CL 100 98 103 103 102  CO2  --  30 31 34* 31  GLUCOSE 87 109* 111* 106* 119*   BUN 5* 6 4* 5* 7  CREATININE 0.80 0.62 0.54 0.57 0.63  CALCIUM  --  9.5 9.0 9.2 9.3   Liver Function Tests:  Recent Labs Lab 10/30/12 0540  AST 15  ALT 10  ALKPHOS 75  BILITOT 0.6  PROT 6.7  ALBUMIN 3.4*   CBC:  Recent Labs Lab 10/29/12 2320  10/30/12 1310 10/30/12 2109 10/31/12 0445 11/01/12 0346 11/02/12 0433  WBC 13.1*  < > 9.2 7.3 7.5 6.5 8.3  NEUTROABS 8.0*  --   --   --   --   --   --   HGB 16.1*  < > 14.6 13.7 14.0 13.3 13.7  HCT 48.0*  < > 43.6 41.7 43.5 41.5 42.7  MCV 89.9  < > 90.1 91.2 91.6 91.2 91.8  PLT 246  < > 188 217 221 234 243  < > = values in this interval not displayed.  CBG:  Recent Labs Lab 10/30/12 0732 10/31/12 0712 11/01/12 0813 11/02/12 0738  GLUCAP 90 93 101* 114*     SIGNED: Time coordinating discharge: Over 30 minutes  Debbora Presto, MD  [facility] 11/02/2012, 10:38 AM Pager [phone]  If 7PM-7AM, please contact night-coverage [email or web address] Password [email or web address]

## 2012-11-10 ENCOUNTER — Telehealth: Payer: Self-pay | Admitting: Internal Medicine

## 2012-11-10 NOTE — Telephone Encounter (Signed)
I explained the results of the letter. She was recently admitted for ischemic colitis.  She c/o continued abdominal pain, constipation, not able to eat,  and nausea.  She has not had follow up with her primary care MD for CBC or BMP as recommended on her discharge paperwork.  She will come in tomorrow at 1:30 and see Doug Sou, PA

## 2012-11-11 ENCOUNTER — Ambulatory Visit: Payer: Medicaid Other | Admitting: Gastroenterology

## 2012-11-22 ENCOUNTER — Ambulatory Visit (INDEPENDENT_AMBULATORY_CARE_PROVIDER_SITE_OTHER): Payer: Medicaid Other

## 2012-11-22 DIAGNOSIS — Z23 Encounter for immunization: Secondary | ICD-10-CM

## 2012-12-01 ENCOUNTER — Other Ambulatory Visit: Payer: Self-pay | Admitting: Pulmonary Disease

## 2012-12-09 ENCOUNTER — Telehealth: Payer: Self-pay | Admitting: Internal Medicine

## 2012-12-12 NOTE — Telephone Encounter (Signed)
Left message for patient to call back She is a Psychologist, counselling patient

## 2012-12-13 NOTE — Telephone Encounter (Signed)
She is scheduled with Dr. Juanda Chance for a post hospital procedure

## 2012-12-19 ENCOUNTER — Encounter: Payer: Self-pay | Admitting: *Deleted

## 2012-12-19 ENCOUNTER — Other Ambulatory Visit: Payer: Self-pay | Admitting: Internal Medicine

## 2012-12-21 ENCOUNTER — Telehealth: Payer: Self-pay

## 2012-12-27 ENCOUNTER — Ambulatory Visit (INDEPENDENT_AMBULATORY_CARE_PROVIDER_SITE_OTHER): Payer: Medicaid Other | Admitting: Internal Medicine

## 2012-12-27 ENCOUNTER — Encounter: Payer: Self-pay | Admitting: Internal Medicine

## 2012-12-27 ENCOUNTER — Other Ambulatory Visit (INDEPENDENT_AMBULATORY_CARE_PROVIDER_SITE_OTHER): Payer: Medicaid Other

## 2012-12-27 VITALS — BP 120/68 | HR 112 | Ht 66.0 in | Wt 131.5 lb

## 2012-12-27 DIAGNOSIS — K559 Vascular disorder of intestine, unspecified: Secondary | ICD-10-CM

## 2012-12-27 DIAGNOSIS — R933 Abnormal findings on diagnostic imaging of other parts of digestive tract: Secondary | ICD-10-CM

## 2012-12-27 DIAGNOSIS — R1031 Right lower quadrant pain: Secondary | ICD-10-CM

## 2012-12-27 LAB — CBC WITH DIFFERENTIAL/PLATELET
Basophils Absolute: 0 10*3/uL (ref 0.0–0.1)
HCT: 46 % (ref 36.0–46.0)
Lymphocytes Relative: 30.7 % (ref 12.0–46.0)
Lymphs Abs: 2.9 10*3/uL (ref 0.7–4.0)
MCHC: 33.7 g/dL (ref 30.0–36.0)
MCV: 88 fl (ref 78.0–100.0)
Monocytes Absolute: 0.6 10*3/uL (ref 0.1–1.0)
Neutrophils Relative %: 61.9 % (ref 43.0–77.0)
Platelets: 245 10*3/uL (ref 150.0–400.0)
RBC: 5.22 Mil/uL — ABNORMAL HIGH (ref 3.87–5.11)
RDW: 14.6 % (ref 11.5–14.6)

## 2012-12-27 LAB — COMPREHENSIVE METABOLIC PANEL
ALT: 17 U/L (ref 0–35)
AST: 19 U/L (ref 0–37)
Alkaline Phosphatase: 71 U/L (ref 39–117)
CO2: 30 mEq/L (ref 19–32)
Creatinine, Ser: 0.8 mg/dL (ref 0.4–1.2)
GFR: 78.68 mL/min (ref >60.00)
Sodium: 136 mEq/L (ref 135–145)
Total Bilirubin: 0.6 mg/dL (ref 0.3–1.2)
Total Protein: 7.5 g/dL (ref 6.0–8.3)

## 2012-12-27 LAB — SEDIMENTATION RATE: Sed Rate: 6 mm/hr (ref 0–22)

## 2012-12-27 NOTE — Progress Notes (Signed)
[name] 1960/03/04 MRN [id number]   History of Present Illness:  This is a 52 year old white female who was hospitalized from 10/29/12-11/02/2012 for ischemic colitis. A CT scan of the abdomen on 10/30/2012 showed thickening and inflammation of the descending colon and proximal sigmoid colon. Her white cell count was elevated to 13,000 and flexible sigmoidoscopy on 11/01/2012 by Dr Leone Payor showed ulcerations in the left colon. Her last full colonoscopy was in September 2011. She has a history of colonic inertia and chronic constipation. Prior to the exacerbation of ischemic colitis, she was put on Cymbalta 30 mg a day and subsequently 60 mg a day. Patient attributes her episode of ischemic colitis to Cymbalta. She is slowly recovering although still having right middle quadrant abdominal pain. She denies rectal bleeding or fever. She has been on a low residue diet. She has lost 15 pounds.   Past Medical History  Diagnosis Date  . TIA (transient ischemic attack)   . GERD (gastroesophageal reflux disease)   . Hepatomegaly     hx  . Colonic inertia   . Lung abscess     "calcified over"; bilateral   . COPD (chronic obstructive pulmonary disease)   . Stroke 2009    TIA  . PTSD (post-traumatic stress disorder)   . Bipolar disorder   . Depression   . Chronic headache   . Seizures     in past  . Fibromyalgia   . Iron deficiency anemia   . Arthritis   . Acute ischemic colitis 11/01/2012  . Marijuana abuse 2014    positive screen in hospital  . TIA (transient ischemic attack)    Past Surgical History  Procedure Laterality Date  . 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 Cesarean section      x 2  . Esophagogastroduodenoscopy  12/23/2011    Procedure: ESOPHAGOGASTRODUODENOSCOPY (EGD);  Surgeon: Hart Carwin, MD;  Location: [facility];  Service: Endoscopy;  Laterality: N/A;  . Colonoscopy    . Colonoscopy N/A 11/01/2012    Procedure: COLONOSCOPY;   Surgeon: Iva Boop, MD;  Location: [facility];  Service: Endoscopy;  Laterality: N/A;    reports that she has been smoking.  She has never used smokeless tobacco. She reports that she does not drink alcohol or use illicit drugs. family history includes Asthma in her son; Clotting disorder in her mother; Emphysema in her father; Ovarian cancer in her sister; Rheum arthritis in her brother and sister. Allergies  Allergen Reactions  . Oxycodone-Acetaminophen Anaphylaxis, Itching and Nausea And Vomiting    Tylox (Pt states, "I can only take Demerol, Oxycontin, and Dilaudid")  . Codeine Nausea And Vomiting  . Hydrocodone Nausea And Vomiting        Review of Systems: Right-sided abdominal pain. Denies constipation while taking MiraLax  The remainder of the 10 point ROS is negative except as outlined in H&P   Physical Exam: General appearance  Well developed, in no distress. Somewhat maniky Eyes- non icteric. HEENT nontraumatic, normocephalic. Mouth no lesions, tongue papillated, no cheilosis. Neck supple without adenopathy, thyroid not enlarged, no carotid bruits, no JVD. Lungs Clear to auscultation bilaterally. Cor normal S1, normal S2, regular rhythm, no murmur,  quiet precordium. Abdomen: Soft but tender in right middle quadrant without rebound or palpable mass. Normal active bowel sounds. No distention. No tympany. Rectal: Soft trace Hemoccult-positive stool. Extremities no pedal edema. Skin no lesions. Neurological alert and  oriented x 3. Psychological normal mood and affect.  Assessment and Plan:  Problem #57 52 year old, white female with an episode of acute ischemic colitis 2 months ago with persistent right middle quadrant abdominal pain. Her colitis was in the distribution of the sigmoid and descending colon. She has colonic inertia and chronic constipation.by history. She is Hemoccult-positive today. We will repeat a CT scan of the abdomen with oral and IV contrast and  recheck her metabolic panel and CBC today as well as her sedimentation rate. She will remain on a low residue diet until we receive results of the CT scan. If the colitis continues, I would consider either doing a full colonoscopy exam to look at the right colon or doing a CT angiogram to look for mesenteric artery occlusive disease. She is a smoker and user of marijuana..   12/27/2012 [name]

## 2012-12-27 NOTE — Patient Instructions (Signed)
Your physician has requested that you go to the basement for the following lab work before leaving today: CBC, Sed Rate, CMET  Please continue Miralax 1 capful (17 grams) dissolved in at least 8 ounces of water or juice once daily.  You have been scheduled for a CT scan of the abdomen and pelvis at [facility] ([address]---this is in the same building as Architectural technologist).   You are scheduled on Friday, 12/30/12 at 1:00 pm. You should arrive 15 minutes prior to your appointment time for registration. Please follow the written instructions below on the day of your exam:  WARNING: IF YOU ARE ALLERGIC TO IODINE/X-RAY DYE, PLEASE NOTIFY RADIOLOGY IMMEDIATELY AT [phone]! YOU WILL BE GIVEN A 13 HOUR PREMEDICATION PREP.  1) Do not eat or drink anything after 9:00 am (4 hours prior to your test) 2) You have been given 2 bottles of oral contrast to drink. The solution may taste better if refrigerated, but do NOT add ice or any other liquid to this solution. Shake well before drinking.    Drink 1 bottle of contrast @ 11:00 am (2 hours prior to your exam)  Drink 1 bottle of contrast @ 12:00 am (1 hour prior to your exam)  You may take any medications as prescribed with a small amount of water except for the following: Metformin, Glucophage, Glucovance, Avandamet, Riomet, Fortamet, Actoplus Met, Janumet, Glumetza or Metaglip. The above medications must be held the day of the exam AND 48 hours after the exam.  The purpose of you drinking the oral contrast is to aid in the visualization of your intestinal tract. The contrast solution may cause some diarrhea. Before your exam is started, you will be given a small amount of fluid to drink. Depending on your individual set of symptoms, you may also receive an intravenous injection of x-ray contrast/dye. Plan on being at [facility] for 30 minutes or long, depending on the type of exam you are having performed.  This test  typically takes 30-45 minutes to complete.  If you have any questions regarding your exam or if you need to reschedule, you may call the CT department at [phone] between the hours of 8:00 am and 5:00 pm, Monday-Friday.  ________________________________________________________________________ CC: Dr Jeannetta Nap

## 2012-12-30 ENCOUNTER — Ambulatory Visit (INDEPENDENT_AMBULATORY_CARE_PROVIDER_SITE_OTHER)
Admission: RE | Admit: 2012-12-30 | Discharge: 2012-12-30 | Disposition: A | Discharge status: Home / Self Care | Payer: Medicaid Other | Source: Ambulatory Visit | Attending: Internal Medicine | Admitting: Internal Medicine

## 2012-12-30 DIAGNOSIS — K559 Vascular disorder of intestine, unspecified: Secondary | ICD-10-CM

## 2012-12-30 MED ORDER — IOHEXOL 300 MG/ML  SOLN
100.0000 mL | Freq: Once | INTRAMUSCULAR | Status: AC | PRN
  Administered 2012-12-30: 100 mL via INTRAVENOUS

## 2013-01-02 ENCOUNTER — Other Ambulatory Visit: Payer: Self-pay | Admitting: Pulmonary Disease

## 2013-01-10 ENCOUNTER — Ambulatory Visit (INDEPENDENT_AMBULATORY_CARE_PROVIDER_SITE_OTHER): Payer: Medicaid Other | Admitting: Pulmonary Disease

## 2013-01-10 ENCOUNTER — Encounter: Payer: Self-pay | Admitting: Pulmonary Disease

## 2013-01-10 VITALS — BP 134/78 | HR 100 | Temp 97.6°F | Ht 66.5 in | Wt 134.8 lb

## 2013-01-10 DIAGNOSIS — J4489 Other specified chronic obstructive pulmonary disease: Secondary | ICD-10-CM

## 2013-01-10 DIAGNOSIS — J449 Chronic obstructive pulmonary disease, unspecified: Secondary | ICD-10-CM

## 2013-01-10 NOTE — Patient Instructions (Signed)
Will try dulera 100/5  2 inhalations am and pm with spacer in the place of advair for 2 weeks.  Let me know if you like this better Stay on spiriva. Stop smoking.  This is the key for you.  followup with me again in 6mos.

## 2013-01-10 NOTE — Assessment & Plan Note (Signed)
The patient appears to be stable from a pulmonary standpoint since the last visit.  I have asked [name] to continue on [name] bronchodilator regimen, but stressed to [name] the importance of total smoking cessation.  We'll try [name] on dulera to see if she gets more benefit from this, but she is to stay on [name] Spiriva as directed.

## 2013-01-10 NOTE — Progress Notes (Signed)
  Subjective:    Patient ID: [name], female    DOB: 18-May-1960, 52 y.o.   MRN: [id number]  HPI The patient comes in today for followup of her known COPD.  She's been staying on her bronchodilator regimen compliantly, but unfortunately continues to smoke.  She has not had any recent acute exacerbation or pulmonary infection.  She was recently in the hospital for another reason, and they tried her on dulera in the place of Advair.  She thinks she might have done better from a breathing standpoint on this.  Overall, she feels that her exertional tolerance is at baseline.   Review of Systems  Constitutional: Positive for unexpected weight change. Negative for fever.  HENT: Negative for congestion, dental problem, ear pain, nosebleeds, postnasal drip, rhinorrhea, sinus pressure, sneezing, sore throat and trouble swallowing.   Eyes: Negative for redness and itching.  Respiratory: Positive for cough and shortness of breath. Negative for chest tightness and wheezing.   Cardiovascular: Negative for palpitations and leg swelling.  Gastrointestinal: Negative for nausea and vomiting.  Genitourinary: Negative for dysuria.  Musculoskeletal: Negative for joint swelling.  Skin: Negative for rash.  Neurological: Positive for headaches.  Hematological: Does not bruise/bleed easily.  Psychiatric/Behavioral: Positive for dysphoric mood. The patient is nervous/anxious.        Objective:   Physical Exam Thin female in no acute distress Nose without purulence or discharge noted Neck without lymphadenopathy or thyromegaly Chest with decreased breath sounds, no wheezes or rhonchi Cardiac exam with regular rate and rhythm Lower extremities without edema, no cyanosis Alert and oriented, moves all 4 extremities.       Assessment & Plan:

## 2013-02-10 ENCOUNTER — Telehealth: Payer: Self-pay | Admitting: Pulmonary Disease

## 2013-02-10 MED ORDER — MOMETASONE FURO-FORMOTEROL FUM 100-5 MCG/ACT IN AERO: 2.0000 | INHALATION_SPRAY | Freq: Two times a day (BID) | RESPIRATORY_TRACT | Status: DC

## 2013-02-10 NOTE — Telephone Encounter (Signed)
Pt calling again in ref to previous msg says she's out of samples.[name]

## 2013-02-10 NOTE — Telephone Encounter (Signed)
Ok to send in script.

## 2013-02-10 NOTE — Telephone Encounter (Signed)
I called and made pt aware RX has been sent. Nothing further needed

## 2013-02-10 NOTE — Telephone Encounter (Signed)
I called and spoke with pt. She reports the [place] does work better than the advair. She can tell a difference. She is out of samples not and will need RX. Please advise KC thanks  [facility] [address]

## 2013-03-22 ENCOUNTER — Encounter (HOSPITAL_COMMUNITY): Payer: Self-pay | Admitting: Emergency Medicine

## 2013-03-22 ENCOUNTER — Inpatient Hospital Stay (HOSPITAL_COMMUNITY)
Admission: EM | Admit: 2013-03-22 | Discharge: 2013-04-03 | DRG: 190 | Disposition: A | Discharge status: Home / Self Care | Payer: Medicaid Other | Attending: Internal Medicine | Admitting: Internal Medicine

## 2013-03-22 ENCOUNTER — Emergency Department (HOSPITAL_COMMUNITY): Payer: Medicaid Other

## 2013-03-22 DIAGNOSIS — F4310 Post-traumatic stress disorder, unspecified: Secondary | ICD-10-CM | POA: Diagnosis present

## 2013-03-22 DIAGNOSIS — Z79899 Other long term (current) drug therapy: Secondary | ICD-10-CM

## 2013-03-22 DIAGNOSIS — Z885 Allergy status to narcotic agent status: Secondary | ICD-10-CM

## 2013-03-22 DIAGNOSIS — K5909 Other constipation: Secondary | ICD-10-CM

## 2013-03-22 DIAGNOSIS — E871 Hypo-osmolality and hyponatremia: Secondary | ICD-10-CM | POA: Diagnosis present

## 2013-03-22 DIAGNOSIS — M129 Arthropathy, unspecified: Secondary | ICD-10-CM | POA: Diagnosis present

## 2013-03-22 DIAGNOSIS — M549 Dorsalgia, unspecified: Secondary | ICD-10-CM | POA: Diagnosis present

## 2013-03-22 DIAGNOSIS — IMO0001 Reserved for inherently not codable concepts without codable children: Secondary | ICD-10-CM | POA: Diagnosis present

## 2013-03-22 DIAGNOSIS — J449 Chronic obstructive pulmonary disease, unspecified: Secondary | ICD-10-CM

## 2013-03-22 DIAGNOSIS — Y9210 Unspecified residential institution as the place of occurrence of the external cause: Secondary | ICD-10-CM | POA: Diagnosis present

## 2013-03-22 DIAGNOSIS — E860 Dehydration: Secondary | ICD-10-CM | POA: Diagnosis present

## 2013-03-22 DIAGNOSIS — J123 Human metapneumovirus pneumonia: Secondary | ICD-10-CM

## 2013-03-22 DIAGNOSIS — R509 Fever, unspecified: Secondary | ICD-10-CM | POA: Diagnosis present

## 2013-03-22 DIAGNOSIS — R109 Unspecified abdominal pain: Secondary | ICD-10-CM | POA: Diagnosis present

## 2013-03-22 DIAGNOSIS — J9600 Acute respiratory failure, unspecified whether with hypoxia or hypercapnia: Secondary | ICD-10-CM

## 2013-03-22 DIAGNOSIS — K219 Gastro-esophageal reflux disease without esophagitis: Secondary | ICD-10-CM | POA: Diagnosis present

## 2013-03-22 DIAGNOSIS — E861 Hypovolemia: Secondary | ICD-10-CM | POA: Diagnosis present

## 2013-03-22 DIAGNOSIS — F40298 Other specified phobia: Secondary | ICD-10-CM | POA: Diagnosis present

## 2013-03-22 DIAGNOSIS — F411 Generalized anxiety disorder: Secondary | ICD-10-CM | POA: Diagnosis present

## 2013-03-22 DIAGNOSIS — J441 Chronic obstructive pulmonary disease with (acute) exacerbation: Secondary | ICD-10-CM | POA: Diagnosis present

## 2013-03-22 DIAGNOSIS — F419 Anxiety disorder, unspecified: Secondary | ICD-10-CM

## 2013-03-22 DIAGNOSIS — B9789 Other viral agents as the cause of diseases classified elsewhere: Secondary | ICD-10-CM | POA: Diagnosis present

## 2013-03-22 DIAGNOSIS — J208 Acute bronchitis due to other specified organisms: Secondary | ICD-10-CM

## 2013-03-22 DIAGNOSIS — J189 Pneumonia, unspecified organism: Secondary | ICD-10-CM

## 2013-03-22 DIAGNOSIS — E43 Unspecified severe protein-calorie malnutrition: Secondary | ICD-10-CM | POA: Diagnosis present

## 2013-03-22 DIAGNOSIS — Z888 Allergy status to other drugs, medicaments and biological substances status: Secondary | ICD-10-CM

## 2013-03-22 DIAGNOSIS — J440 Chronic obstructive pulmonary disease with acute lower respiratory infection: Principal | ICD-10-CM | POA: Diagnosis present

## 2013-03-22 DIAGNOSIS — J209 Acute bronchitis, unspecified: Principal | ICD-10-CM | POA: Diagnosis present

## 2013-03-22 DIAGNOSIS — Z720 Tobacco use: Secondary | ICD-10-CM | POA: Diagnosis present

## 2013-03-22 DIAGNOSIS — R0902 Hypoxemia: Secondary | ICD-10-CM

## 2013-03-22 DIAGNOSIS — G894 Chronic pain syndrome: Secondary | ICD-10-CM | POA: Diagnosis present

## 2013-03-22 DIAGNOSIS — S8010XA Contusion of unspecified lower leg, initial encounter: Secondary | ICD-10-CM | POA: Diagnosis present

## 2013-03-22 DIAGNOSIS — K559 Vascular disorder of intestine, unspecified: Secondary | ICD-10-CM | POA: Diagnosis present

## 2013-03-22 DIAGNOSIS — J9620 Acute and chronic respiratory failure, unspecified whether with hypoxia or hypercapnia: Secondary | ICD-10-CM | POA: Diagnosis present

## 2013-03-22 DIAGNOSIS — X58XXXA Exposure to other specified factors, initial encounter: Secondary | ICD-10-CM

## 2013-03-22 DIAGNOSIS — IMO0002 Reserved for concepts with insufficient information to code with codable children: Secondary | ICD-10-CM

## 2013-03-22 DIAGNOSIS — Z791 Long term (current) use of non-steroidal anti-inflammatories (NSAID): Secondary | ICD-10-CM

## 2013-03-22 DIAGNOSIS — Z8673 Personal history of transient ischemic attack (TIA), and cerebral infarction without residual deficits: Secondary | ICD-10-CM

## 2013-03-22 DIAGNOSIS — K5900 Constipation, unspecified: Secondary | ICD-10-CM | POA: Diagnosis present

## 2013-03-22 DIAGNOSIS — F17200 Nicotine dependence, unspecified, uncomplicated: Secondary | ICD-10-CM | POA: Diagnosis present

## 2013-03-22 DIAGNOSIS — G8929 Other chronic pain: Secondary | ICD-10-CM

## 2013-03-22 DIAGNOSIS — Z7982 Long term (current) use of aspirin: Secondary | ICD-10-CM

## 2013-03-22 DIAGNOSIS — D509 Iron deficiency anemia, unspecified: Secondary | ICD-10-CM | POA: Diagnosis present

## 2013-03-22 DIAGNOSIS — F319 Bipolar disorder, unspecified: Secondary | ICD-10-CM | POA: Diagnosis present

## 2013-03-22 DIAGNOSIS — J329 Chronic sinusitis, unspecified: Secondary | ICD-10-CM | POA: Diagnosis present

## 2013-03-22 DIAGNOSIS — R569 Unspecified convulsions: Secondary | ICD-10-CM | POA: Diagnosis present

## 2013-03-22 LAB — POCT I-STAT, CHEM 8
BUN: 6 mg/dL (ref 6–23)
Calcium, Ion: 1.12 mmol/L (ref 1.12–1.23)
Chloride: 94 mEq/L — ABNORMAL LOW (ref 96–112)
Creatinine, Ser: 0.7 mg/dL (ref 0.50–1.10)
GLUCOSE: 125 mg/dL — AB (ref 70–99)
HEMATOCRIT: 48 % — AB (ref 36.0–46.0)
HEMOGLOBIN: 16.3 g/dL — AB (ref 12.0–15.0)
Potassium: 4.8 mEq/L (ref 3.7–5.3)
Sodium: 132 mEq/L — ABNORMAL LOW (ref 137–147)
TCO2: 33 mmol/L (ref 0–100)

## 2013-03-22 LAB — CBC WITH DIFFERENTIAL/PLATELET
BASOS PCT: 0 % (ref 0–1)
Basophils Absolute: 0 10*3/uL (ref 0.0–0.1)
EOS ABS: 0 10*3/uL (ref 0.0–0.7)
Eosinophils Relative: 0 % (ref 0–5)
HEMATOCRIT: 43.5 % (ref 36.0–46.0)
Hemoglobin: 14.6 g/dL (ref 12.0–15.0)
Lymphocytes Relative: 24 % (ref 12–46)
Lymphs Abs: 1.3 10*3/uL (ref 0.7–4.0)
MCH: 30.2 pg (ref 26.0–34.0)
MCHC: 33.6 g/dL (ref 30.0–36.0)
MCV: 89.9 fL (ref 78.0–100.0)
MONO ABS: 0.5 10*3/uL (ref 0.1–1.0)
MONOS PCT: 10 % (ref 3–12)
Neutro Abs: 3.4 10*3/uL (ref 1.7–7.7)
Neutrophils Relative %: 65 % (ref 43–77)
Platelets: 190 10*3/uL (ref 150–400)
RBC: 4.84 MIL/uL (ref 3.87–5.11)
RDW: 14.1 % (ref 11.5–15.5)
WBC: 5.2 10*3/uL (ref 4.0–10.5)

## 2013-03-22 MED ORDER — ALBUTEROL SULFATE (2.5 MG/3ML) 0.083% IN NEBU
5.0000 mg | INHALATION_SOLUTION | Freq: Once | RESPIRATORY_TRACT | Status: AC
  Administered 2013-03-22: 5 mg via RESPIRATORY_TRACT
  Filled 2013-03-22: qty 6

## 2013-03-22 MED ORDER — METHYLPREDNISOLONE SODIUM SUCC 125 MG IJ SOLR
125.0000 mg | Freq: Once | INTRAMUSCULAR | Status: AC
  Administered 2013-03-22: 125 mg via INTRAVENOUS
  Filled 2013-03-22: qty 2

## 2013-03-22 MED ORDER — FENTANYL CITRATE 0.05 MG/ML IJ SOLN
50.0000 ug | INTRAMUSCULAR | Status: DC | PRN
  Administered 2013-03-22 – 2013-03-23 (×6): 50 ug via INTRAVENOUS
  Filled 2013-03-22 (×6): qty 2

## 2013-03-22 MED ORDER — LEVOFLOXACIN IN D5W 750 MG/150ML IV SOLN
750.0000 mg | Freq: Once | INTRAVENOUS | Status: DC
  Filled 2013-03-22: qty 150

## 2013-03-22 MED ORDER — ACETAMINOPHEN 325 MG PO TABS
650.0000 mg | ORAL_TABLET | Freq: Four times a day (QID) | ORAL | Status: DC | PRN
  Administered 2013-03-22: 650 mg via ORAL
  Filled 2013-03-22: qty 2

## 2013-03-22 MED ORDER — SODIUM CHLORIDE 0.9 % IJ SOLN
3.0000 mL | Freq: Two times a day (BID) | INTRAMUSCULAR | Status: DC
  Administered 2013-03-23 – 2013-03-24 (×2): 3 mL via INTRAVENOUS
  Administered 2013-03-25: 10 mL via INTRAVENOUS
  Administered 2013-03-27 – 2013-04-03 (×7): 3 mL via INTRAVENOUS

## 2013-03-22 MED ORDER — OSELTAMIVIR PHOSPHATE 75 MG PO CAPS
75.0000 mg | ORAL_CAPSULE | Freq: Two times a day (BID) | ORAL | Status: DC
  Administered 2013-03-22 – 2013-03-23 (×3): 75 mg via ORAL
  Filled 2013-03-22 (×6): qty 1

## 2013-03-22 MED ORDER — ONDANSETRON HCL 4 MG PO TABS
4.0000 mg | ORAL_TABLET | Freq: Four times a day (QID) | ORAL | Status: DC | PRN
  Administered 2013-03-24: 4 mg via ORAL
  Filled 2013-03-22: qty 1

## 2013-03-22 MED ORDER — SODIUM CHLORIDE 0.9 % IV BOLUS (SEPSIS)
1000.0000 mL | Freq: Once | INTRAVENOUS | Status: AC
  Administered 2013-03-22: 1000 mL via INTRAVENOUS

## 2013-03-22 MED ORDER — METHYLPREDNISOLONE SODIUM SUCC 125 MG IJ SOLR
60.0000 mg | Freq: Two times a day (BID) | INTRAMUSCULAR | Status: DC
  Administered 2013-03-23 – 2013-03-24 (×3): 60 mg via INTRAVENOUS
  Filled 2013-03-22: qty 2
  Filled 2013-03-22 (×4): qty 0.96

## 2013-03-22 MED ORDER — FENTANYL CITRATE 0.05 MG/ML IJ SOLN
50.0000 ug | Freq: Once | INTRAMUSCULAR | Status: AC
  Administered 2013-03-22: 50 ug via INTRAVENOUS
  Filled 2013-03-22: qty 2

## 2013-03-22 MED ORDER — IPRATROPIUM BROMIDE 0.02 % IN SOLN
0.5000 mg | Freq: Once | RESPIRATORY_TRACT | Status: AC
  Administered 2013-03-22: 0.5 mg via RESPIRATORY_TRACT
  Filled 2013-03-22: qty 2.5

## 2013-03-22 MED ORDER — ALBUTEROL SULFATE (2.5 MG/3ML) 0.083% IN NEBU
2.5000 mg | INHALATION_SOLUTION | RESPIRATORY_TRACT | Status: DC | PRN
  Administered 2013-03-22 – 2013-03-25 (×3): 2.5 mg via RESPIRATORY_TRACT
  Filled 2013-03-22 (×5): qty 3

## 2013-03-22 MED ORDER — ONDANSETRON HCL 4 MG/2ML IJ SOLN
4.0000 mg | Freq: Once | INTRAMUSCULAR | Status: AC
  Administered 2013-03-22: 4 mg via INTRAVENOUS
  Filled 2013-03-22: qty 2

## 2013-03-22 MED ORDER — ACETAMINOPHEN 650 MG RE SUPP: 650.0000 mg | Freq: Four times a day (QID) | RECTAL | Status: DC | PRN

## 2013-03-22 MED ORDER — DIAZEPAM 5 MG PO TABS
10.0000 mg | ORAL_TABLET | Freq: Two times a day (BID) | ORAL | Status: DC | PRN
  Administered 2013-03-22 – 2013-03-25 (×6): 10 mg via ORAL
  Filled 2013-03-22 (×6): qty 2

## 2013-03-22 MED ORDER — ONDANSETRON HCL 4 MG/2ML IJ SOLN
4.0000 mg | Freq: Four times a day (QID) | INTRAMUSCULAR | Status: DC | PRN
  Administered 2013-03-23 – 2013-03-28 (×3): 4 mg via INTRAVENOUS
  Filled 2013-03-22 (×4): qty 2

## 2013-03-22 MED ORDER — PANTOPRAZOLE SODIUM 40 MG PO TBEC
40.0000 mg | DELAYED_RELEASE_TABLET | Freq: Every day | ORAL | Status: DC
  Administered 2013-03-23 – 2013-03-24 (×2): 40 mg via ORAL
  Filled 2013-03-22 (×3): qty 1

## 2013-03-22 MED ORDER — LEVALBUTEROL HCL 0.63 MG/3ML IN NEBU
0.6300 mg | INHALATION_SOLUTION | RESPIRATORY_TRACT | Status: DC | PRN
  Administered 2013-03-22 – 2013-03-24 (×2): 0.63 mg via RESPIRATORY_TRACT
  Filled 2013-03-22: qty 3

## 2013-03-22 NOTE — ED Provider Notes (Signed)
CSN: [id number]     Arrival date & time 03/22/13  1738 History   First MD Initiated Contact with Patient 03/22/13 1751     Chief Complaint  Patient presents with  . Shortness of Breath   (Consider location/radiation/quality/duration/timing/severity/associated sxs/prior Treatment) Patient is a 53 y.o. female presenting with shortness of breath. The history is provided by the patient.  Shortness of Breath Associated symptoms: abdominal pain, cough, fever and headaches   Associated symptoms: no chest pain, no rash and no vomiting    patient presents with shortness of breath over the last several days. She has a history of previous lung abscesses in his head some trouble breathing since then. She takes oxygen as needed. She has had to use it more recently. She states she's had fevers up to 103 at home. She states she has had a cough with some sputum production. She was started on amoxicillin by her PCP 2 days ago. Upon arrival to the ED she was found to have saturations of 75%. She has had some abdominal pain from coughing. She also has a frontal headache. She states both her pains improve with Tylenol.  Past Medical History  Diagnosis Date  . TIA (transient ischemic attack)   . GERD (gastroesophageal reflux disease)   . Hepatomegaly     hx  . Colonic inertia   . Lung abscess     "calcified over"; bilateral   . COPD (chronic obstructive pulmonary disease)   . Stroke 2009    TIA  . PTSD (post-traumatic stress disorder)   . Bipolar disorder   . Depression   . Chronic headache   . Seizures     in past  . Fibromyalgia   . Iron deficiency anemia   . Arthritis   . Acute ischemic colitis 11/01/2012  . Marijuana abuse 2014    positive screen in hospital  . TIA (transient ischemic attack)    Past Surgical History  Procedure Laterality Date  . 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 Cesarean section      x 2  . Esophagogastroduodenoscopy  12/23/2011     Procedure: ESOPHAGOGASTRODUODENOSCOPY (EGD);  Surgeon: Hart Carwin, MD;  Location: [facility];  Service: Endoscopy;  Laterality: N/A;  . Colonoscopy    . Colonoscopy N/A 11/01/2012    Procedure: COLONOSCOPY;  Surgeon: Iva Boop, MD;  Location: [facility];  Service: Endoscopy;  Laterality: N/A;   Family History  Problem Relation Age of Onset  . Emphysema Father   . Asthma Son   . Clotting disorder Mother   . Rheum arthritis Sister   . Rheum arthritis Brother   . Ovarian cancer Sister    History  Substance Use Topics  . Smoking status: Current Every Day Smoker -- 1.00 packs/day for 39 years    Types: Cigarettes  . Smokeless tobacco: Never Used     Comment: 3-4 daily11/11/14  . Alcohol Use: No   OB History   Grav Para Term Preterm Abortions TAB SAB Ect Mult Living                 Review of Systems  Constitutional: Positive for fever, chills and fatigue. Negative for activity change and appetite change.  Eyes: Negative for pain.  Respiratory: Positive for cough and shortness of breath. Negative for chest tightness.   Cardiovascular: Negative for chest pain and leg swelling.  Gastrointestinal: Positive for abdominal pain. Negative  for nausea, vomiting and diarrhea.  Genitourinary: Negative for flank pain.  Musculoskeletal: Negative for back pain and neck stiffness.  Skin: Negative for rash.  Neurological: Positive for headaches. Negative for weakness and numbness.  Psychiatric/Behavioral: Negative for behavioral problems.    Allergies  Oxycodone-acetaminophen; Codeine; Hydrocodone; and Quinolones  Home Medications   Current Outpatient Rx  Name  Route  Sig  Dispense  Refill  . acetaminophen (TYLENOL) 500 MG tablet   Oral   Take 1,000 mg by mouth every 6 (six) hours as needed for moderate pain.         Marland Kitchen albuterol (PROVENTIL) (2.5 MG/3ML) 0.083% nebulizer solution   Nebulization   Take 2.5 mg by nebulization every 6 (six) hours as needed for shortness of  breath. For shortness of breath         . albuterol (VENTOLIN HFA) 108 (90 BASE) MCG/ACT inhaler   Inhalation   Inhale 2 puffs into the lungs every 6 (six) hours as needed for wheezing or shortness of breath.          Marland Kitchen amoxicillin (AMOXIL) 500 MG capsule   Oral   Take 1,000 mg by mouth 3 (three) times daily.         Marland Kitchen aspirin EC 81 MG tablet   Oral   Take 1 tablet (81 mg total) by mouth daily.   30 tablet   2   . buPROPion (WELLBUTRIN SR) 150 MG 12 hr tablet   Oral   Take 150 mg by mouth 2 (two) times daily.         . cyclobenzaprine (FLEXERIL) 5 MG tablet   Oral   Take 1 tablet (5 mg total) by mouth 3 (three) times daily as needed for muscle spasms.   6 tablet   0   . diazepam (VALIUM) 10 MG tablet   Oral   Take 1 tablet (10 mg total) by mouth every 12 (twelve) hours as needed for anxiety.   14 tablet   0   . dicyclomine (BENTYL) 20 MG tablet   Oral   Take 1 tablet (20 mg total) by mouth 3 (three) times daily before meals.   90 tablet   1   . ibuprofen (ADVIL,MOTRIN) 200 MG tablet   Oral   Take 600 mg by mouth every 6 (six) hours as needed for fever.         . loratadine (CLARITIN) 10 MG tablet   Oral   Take 10 mg by mouth every evening.          . mometasone-formoterol (DULERA) 100-5 MCG/ACT AERO   Inhalation   Inhale 2 puffs into the lungs 2 (two) times daily.   1 Inhaler   5   . oxyCODONE (ROXICODONE) 15 MG immediate release tablet   Oral   Take 15 mg by mouth every 4 (four) hours as needed for pain (back pain).         . pantoprazole (PROTONIX) 40 MG tablet   Oral   Take 1 tablet (40 mg total) by mouth daily at 6 (six) AM.   30 tablet   1   . polyethylene glycol (MIRALAX / GLYCOLAX) packet   Oral   Take 17 g by mouth daily as needed (constipation).         . promethazine (PHENERGAN) 25 MG tablet   Oral   Take 0.5-1 tablets (12.5-25 mg total) by mouth every 6 (six) hours as needed for nausea.   30 tablet  0   . SPIRIVA  HANDIHALER 18 MCG inhalation capsule      inhale contents of 1 capsule by mouth once daily   30 capsule   6    BP 119/52  Pulse 111  Temp(Src) 98.9 F (37.2 C) (Oral)  Resp 22  SpO2 98% Physical Exam  Nursing note and vitals reviewed. Constitutional: She is oriented to person, place, and time. She appears well-developed and well-nourished.  HENT:  Head: Normocephalic and atraumatic.  Eyes: EOM are normal. Pupils are equal, round, and reactive to light.  Neck: Normal range of motion. Neck supple.  Cardiovascular: Regular rhythm and normal heart sounds.   No murmur heard. Mild tachycardia  Pulmonary/Chest: She is in respiratory distress. She has wheezes. She has no rales.  Mild respiratory distress. Decreased air movement throughout. End expiratory wheezes. Mild inspiratory wheezes also  Abdominal: Soft. Bowel sounds are normal. She exhibits no distension. There is tenderness. There is no rebound and no guarding.  Mild upper abdominal tenderness without rebound or guarding.  Musculoskeletal: Normal range of motion.  Neurological: She is alert and oriented to person, place, and time. No cranial nerve deficit.  Skin: Skin is warm and dry.  Psychiatric: She has a normal mood and affect. Her speech is normal.    ED Course  Procedures (including critical care time) Labs Review Labs Reviewed  POCT I-STAT, CHEM 8 - Abnormal; Notable for the following:    Sodium 132 (*)    Chloride 94 (*)    Glucose, Bld 125 (*)    Hemoglobin 16.3 (*)    HCT 48.0 (*)    All other components within normal limits  RESPIRATORY VIRUS PANEL  URINE CULTURE  CULTURE, BLOOD (ROUTINE X 2)  CULTURE, BLOOD (ROUTINE X 2)  CBC WITH DIFFERENTIAL  INFLUENZA PANEL BY PCR (TYPE A & B, H1N1)  CREATININE, URINE, RANDOM  SODIUM, URINE, RANDOM  OSMOLALITY, URINE  MAGNESIUM  PHOSPHORUS  COMPREHENSIVE METABOLIC PANEL  CBC  PROCALCITONIN  CBC  URINALYSIS, ROUTINE W REFLEX MICROSCOPIC  LACTIC ACID, PLASMA   COMPREHENSIVE METABOLIC PANEL   Imaging Review Dg Chest Port 1 View  03/22/2013   CLINICAL DATA:  Fever. Cough. Chills. Weakness. Shortness of breath.  EXAM: PORTABLE CHEST - 1 VIEW  COMPARISON:  10/30/2012  FINDINGS: Deformity of the proximal right humerus, incompletely imaged. Midline trachea. No pleural effusion or pneumothorax. Hyperinflation and diffuse interstitial thickening. Left upper lobe calcified granuloma. No lobar consolidation.  IMPRESSION: COPD/chronic bronchitis. No acute superimposed process.  Deformity of the proximal right humerus. Incompletely imaged. Correlate with Trauma.   Electronically Signed   By: Jeronimo GreavesKyle  Talbot M.D.   On: 03/22/2013 18:42    EKG Interpretation   None       MDM   1. COPD (chronic obstructive pulmonary disease)   2. CAP (community acquired pneumonia)   3. Hypoxia   4. Chronic abdominal pain   5. COPD exacerbation   6. Dehydration   7. Hyponatremia    Patient with shortness of breath. Has history of COPD. Also has had fevers at home. X-ray does not show pneumonia, however patient has been on antibiotics. Patient has had episodes of hypoxia, particularly with exertion. Will be admitted to internal medicine.    Juliet RudeNathan R. Rubin PayorPickering, MD 03/23/13 0000

## 2013-03-22 NOTE — H&P (Signed)
PCP: Kaleen Mask, MD    Chief Complaint:  Cough and fever  HPI: [name] is a 53 y.o. female   has a past medical history of TIA (transient ischemic attack); GERD (gastroesophageal reflux disease); Hepatomegaly; Colonic inertia; Lung abscess; COPD (chronic obstructive pulmonary disease); Stroke (2009); PTSD (post-traumatic stress disorder); Bipolar disorder; Depression; Chronic headache; Seizures; Fibromyalgia; Iron deficiency anemia; Arthritis; Acute ischemic colitis (11/01/2012); Marijuana abuse (2014); and TIA (transient ischemic attack).   Presented with  3 days ago she started to have cough, worsening fever up to 103 and worsening shortness of breath. Her son had similar illness recently. She has hx of severe COPD. In 2010 she had severe pulmonary infection with pulmonary abscesses and severe H1N1 infection. Patient continuous to smoke but states it is  very little. Patient was noted severely hypoxic on RA down to 54%. She is usually not on oxygen. She has oxygen tank at home but hardly ever uses. Does not endorse chest pain. Patient states that she went to her primary care provider was prescribed amoxicillin that she been taking for the past 3 days without any improvement. Patient states that her fever reached up to 103.2 at home at which point she took Tylenol presented to emergency department. When patient finally presented to emergency department she was found to be wheezing with diminished air movement and increased work of breathing. Chest x-ray showed no evidence of pneumonia but rather severe COPD. Hospitalist called for admission. Review of Systems:    Pertinent positives include:  Fevers, chills, headaches, shortness of breath at rest.  dyspnea on exertion,  nausea, Constitutional:  No weight loss, night sweats,fatigue, weight loss  HEENT:  No  Difficulty swallowing,Tooth/dental problems,Sore throat,  No sneezing, itching, ear ache, nasal congestion, post nasal drip,   Cardio-vascular:  No chest pain, Orthopnea, PND, anasarca, dizziness, palpitations.no Bilateral lower extremity swelling  GI:  No heartburn, indigestion, abdominal pain, vomiting, diarrhea, change in bowel habits, loss of appetite, melena, blood in stool, hematemesis Resp:  no  No excess mucus, no productive cough, No non-productive cough, No coughing up of blood.No change in color of mucus.No wheezing. Skin:  no rash or lesions. No jaundice GU:  no dysuria, change in color of urine, no urgency or frequency. No straining to urinate.  No flank pain.  Musculoskeletal:  No joint pain or no joint swelling. No decreased range of motion. No back pain.  Psych:  No change in mood or affect. No depression or anxiety. No memory loss.  Neuro: no localizing neurological complaints, no tingling, no weakness, no double vision, no gait abnormality, no slurred speech, no confusion  Otherwise ROS are negative except for above, 10 systems were reviewed  Past Medical History: Past Medical History  Diagnosis Date  . TIA (transient ischemic attack)   . GERD (gastroesophageal reflux disease)   . Hepatomegaly     hx  . Colonic inertia   . Lung abscess     "calcified over"; bilateral   . COPD (chronic obstructive pulmonary disease)   . Stroke 2009    TIA  . PTSD (post-traumatic stress disorder)   . Bipolar disorder   . Depression   . Chronic headache   . Seizures     in past  . Fibromyalgia   . Iron deficiency anemia   . Arthritis   . Acute ischemic colitis 11/01/2012  . Marijuana abuse 2014    positive screen in hospital  . TIA (transient ischemic attack)    Past  Surgical History  Procedure Laterality Date  . 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 Cesarean section      x 2  . Esophagogastroduodenoscopy  12/23/2011    Procedure: ESOPHAGOGASTRODUODENOSCOPY (EGD);  Surgeon: Hart Carwin, MD;  Location: [facility];  Service: Endoscopy;  Laterality: N/A;   . Colonoscopy    . Colonoscopy N/A 11/01/2012    Procedure: COLONOSCOPY;  Surgeon: Iva Boop, MD;  Location: [facility];  Service: Endoscopy;  Laterality: N/A;     Medications: Prior to Admission medications   Medication Sig Start Date End Date Taking? Authorizing Provider  acetaminophen (TYLENOL) 500 MG tablet Take 1,000 mg by mouth every 6 (six) hours as needed for moderate pain.   Yes Historical Provider, MD  albuterol (PROVENTIL) (2.5 MG/3ML) 0.083% nebulizer solution Take 2.5 mg by nebulization every 6 (six) hours as needed for shortness of breath. For shortness of breath   Yes Historical Provider, MD  albuterol (VENTOLIN HFA) 108 (90 BASE) MCG/ACT inhaler Inhale 2 puffs into the lungs every 6 (six) hours as needed for wheezing or shortness of breath.    Yes Historical Provider, MD  amoxicillin (AMOXIL) 500 MG capsule Take 1,000 mg by mouth 3 (three) times daily.   Yes Historical Provider, MD  aspirin EC 81 MG tablet Take 1 tablet (81 mg total) by mouth daily. 10/14/12  Yes Costin Otelia Sergeant, MD  buPROPion (WELLBUTRIN SR) 150 MG 12 hr tablet Take 150 mg by mouth 2 (two) times daily.   Yes Historical Provider, MD  cyclobenzaprine (FLEXERIL) 5 MG tablet Take 1 tablet (5 mg total) by mouth 3 (three) times daily as needed for muscle spasms. 10/14/12  Yes Costin Otelia Sergeant, MD  diazepam (VALIUM) 10 MG tablet Take 1 tablet (10 mg total) by mouth every 12 (twelve) hours as needed for anxiety. 11/02/12  Yes Dorothea Ogle, MD  dicyclomine (BENTYL) 20 MG tablet Take 1 tablet (20 mg total) by mouth 3 (three) times daily before meals. 11/02/12  Yes Dorothea Ogle, MD  ibuprofen (ADVIL,MOTRIN) 200 MG tablet Take 600 mg by mouth every 6 (six) hours as needed for fever.   Yes Historical Provider, MD  loratadine (CLARITIN) 10 MG tablet Take 10 mg by mouth every evening.    Yes Historical Provider, MD  mometasone-formoterol (DULERA) 100-5 MCG/ACT AERO Inhale 2 puffs into the lungs 2 (two) times daily. 02/10/13   Yes Barbaraann Share, MD  oxyCODONE (ROXICODONE) 15 MG immediate release tablet Take 15 mg by mouth every 4 (four) hours as needed for pain (back pain).   Yes Historical Provider, MD  pantoprazole (PROTONIX) 40 MG tablet Take 1 tablet (40 mg total) by mouth daily at 6 (six) AM. 11/02/12  Yes Dorothea Ogle, MD  polyethylene glycol (MIRALAX / Ethelene Hal) packet Take 17 g by mouth daily as needed (constipation).   Yes Historical Provider, MD  promethazine (PHENERGAN) 25 MG tablet Take 0.5-1 tablets (12.5-25 mg total) by mouth every 6 (six) hours as needed for nausea. 11/02/12  Yes Dorothea Ogle, MD  SPIRIVA HANDIHALER 18 MCG inhalation capsule inhale contents of 1 capsule by mouth once daily 12/01/12  Yes Barbaraann Share, MD    Allergies:   Allergies  Allergen Reactions  . Oxycodone-Acetaminophen Anaphylaxis, Itching and Nausea And Vomiting    Tylox (Pt states, "I can only take Demerol, Oxycontin, and Dilaudid")  . Codeine Nausea And Vomiting  .  Hydrocodone Nausea And Vomiting  . Quinolones Hives    Social History:  Ambulatory   independently   Lives at home with family   reports that she has been smoking Cigarettes.  She has a 39 pack-year smoking history. She has never used smokeless tobacco. She reports that she does not drink alcohol or use illicit drugs.   Family History: family history includes Asthma in her son; Clotting disorder in her mother; Emphysema in her father; Ovarian cancer in her sister; Rheum arthritis in her brother and sister.    Physical Exam: Patient Vitals for the past 24 hrs:  BP Temp Temp src Pulse Resp SpO2  03/22/13 2030 102/85 mmHg - - 105 20 100 %  03/22/13 1816 109/67 mmHg - - 109 20 99 %  03/22/13 1745 109/67 mmHg 98.9 F (37.2 C) Oral 113 24 79 %  03/22/13 1743 109/67 mmHg - - 114 22 96 %  03/22/13 1738 - - - 119 26 84 %    1. General:  Increased work of breathing 2. Psychological: Alert and  Oriented 3. Head/ENT:   Moist   Mucous Membranes                           Head Non traumatic, neck supple                          Normal   Dentition 4. SKIN:   decreased Skin turgor,  Skin clean Dry and intact no rash 5. Heart: Rapid but Regular rate and rhythm no Murmur, Rub or gallop 6. Lungs:  wheezes no crackles, diminished air movement 7. Abdomen: Soft, non-tender, Non distended 8. Lower extremities: no clubbing, cyanosis, or edema 9. Neurologically Grossly intact, moving all 4 extremities equally 10. MSK: Normal range of motion  body mass index is unknown because there is no weight on file.   Labs on Admission:   Recent Labs  03/22/13 1911  NA 132*  K 4.8  CL 94*  GLUCOSE 125*  BUN 6  CREATININE 0.70   No results found for this basename: AST, ALT, ALKPHOS, BILITOT, PROT, ALBUMIN,  in the last 72 hours No results found for this basename: LIPASE, AMYLASE,  in the last 72 hours  Recent Labs  03/22/13 1815 03/22/13 1911  WBC 5.2  --   NEUTROABS 3.4  --   HGB 14.6 16.3*  HCT 43.5 48.0*  MCV 89.9  --   PLT 190  --    No results found for this basename: CKTOTAL, CKMB, CKMBINDEX, TROPONINI,  in the last 72 hours No results found for this basename: TSH, T4TOTAL, FREET3, T3FREE, THYROIDAB,  in the last 72 hours No results found for this basename: VITAMINB12, FOLATE, FERRITIN, TIBC, IRON, RETICCTPCT,  in the last 72 hours Lab Results  Component Value Date   HGBA1C 5.6 10/13/2012    The CrCl is unknown because both a height and weight (above a minimum accepted value) are required for this calculation. ABG    Component Value Date/Time   PHART 7.305* 08/26/2008 1823   HCO3 17.0* 08/26/2008 1823   TCO2 33 03/22/2013 1911   ACIDBASEDEF 8.1* 08/26/2008 1823   O2SAT 97.4 08/26/2008 1823     Lab Results  Component Value Date   DDIMER  Value: 0.98        AT THE INHOUSE ESTABLISHED CUTOFF VALUE OF 0.48 ug/mL FEU, THIS ASSAY HAS BEEN DOCUMENTED IN  THE LITERATURE TO HAVE* 04/01/2007       Cultures:    Component Value Date/Time    SDES BRONCHIAL ALVEOLAR LAVAGE lungs 09/26/2008 0833   SDES BRONCHIAL ALVEOLAR LAVAGE lungs 09/26/2008 0833   SPECREQUEST abnormal ct IMMUNE:NORM 09/26/2008 0833   SPECREQUEST abnormal ct IMMUNE:NORM 09/26/2008 0833   CULT NO ACID FAST BACILLI ISOLATED IN 6 WEEKS 09/26/2008 0833   CULT No Fungi Isolated in 4 Weeks 09/26/2008 0833   REPTSTATUS 11/11/2008 FINAL 09/26/2008 0833   REPTSTATUS 10/29/2008 FINAL 09/26/2008 0833       Radiological Exams on Admission: Dg Chest Port 1 View  03/22/2013   CLINICAL DATA:  Fever. Cough. Chills. Weakness. Shortness of breath.  EXAM: PORTABLE CHEST - 1 VIEW  COMPARISON:  10/30/2012  FINDINGS: Deformity of the proximal right humerus, incompletely imaged. Midline trachea. No pleural effusion or pneumothorax. Hyperinflation and diffuse interstitial thickening. Left upper lobe calcified granuloma. No lobar consolidation.  IMPRESSION: COPD/chronic bronchitis. No acute superimposed process.  Deformity of the proximal right humerus. Incompletely imaged. Correlate with Trauma.   Electronically Signed   By: Jeronimo GreavesKyle  Talbot M.D.   On: 03/22/2013 18:42    Chart has been reviewed  Assessment/Plan  53 year old female with history of COPD presents with COPD exacerbation in setting of febrile illness  Present on Admission:  . Febrile illness, acute - possible influenza will cover with Tamiflu, put on droplet precautions, workup for other sources of infection the blood cultures and urine culture no gastrointestinal complaints  . COPD exacerbation we'll admit to step down but increased work of breathing, provide oxygen, nebulizer treatments continue Dulera,  broad-spectrum antibiotics for now, have discussed with pulmonology use of steroids in the setting of potential lives at pulmonology recommends treating the COPD appropriately with steroids we'll write for Solu-Medrol. . Chronic abdominal pain - continue home medications  . Hyponatremia -obtain urine electrolytes most likely  secondary to dehydration  . Dehydration - provide IV fluids obtain orthostatics when able to tolerate Abnormal imaging of her right proximal humerus - patient states she had a fracture at that site many years ago she's currently asymptomatic reviewed this the radiologist who states it's not appear to be an acute fracture  Prophylaxis: Lovenox, Protonix  CODE STATUS: FULL CODE  Other plan as per orders.  I have spent a total of 65 min on this admission - time taken to discuss admission the pulmonology as well as radiology and  Tristen Pennino 03/22/2013, 9:40 PM

## 2013-03-22 NOTE — ED Notes (Signed)
Pt attempt to ambulate to bathroom. Unable to tolerate. Drop in SPo2.

## 2013-03-22 NOTE — ED Notes (Signed)
Pt from home has history of removal of left upper lung due to abscess, pt was put on amoxicillin for fever and congestion 3 days ago, now states she has become SOB increasingly. Has not been using oxygen for a while now, pt now states today she had to put her home oxygen on due to SOB

## 2013-03-22 NOTE — ED Notes (Signed)
Family member out at desk and states pt c/o nausea-Dr. Rubin PayorPickering notified-new order noted

## 2013-03-23 LAB — URINALYSIS, ROUTINE W REFLEX MICROSCOPIC
BILIRUBIN URINE: NEGATIVE
Glucose, UA: NEGATIVE mg/dL
HGB URINE DIPSTICK: NEGATIVE
KETONES UR: NEGATIVE mg/dL
Leukocytes, UA: NEGATIVE
Nitrite: NEGATIVE
PROTEIN: NEGATIVE mg/dL
Specific Gravity, Urine: 1.005 (ref 1.005–1.030)
UROBILINOGEN UA: 0.2 mg/dL (ref 0.0–1.0)
pH: 6 (ref 5.0–8.0)

## 2013-03-23 LAB — COMPREHENSIVE METABOLIC PANEL
ALK PHOS: 68 U/L (ref 39–117)
ALT: 19 U/L (ref 0–35)
ALT: 18 U/L (ref 0–35)
AST: 32 U/L (ref 0–37)
AST: 31 U/L (ref 0–37)
Albumin: 3.5 g/dL (ref 3.5–5.2)
Albumin: 3.1 g/dL — ABNORMAL LOW (ref 3.5–5.2)
Alkaline Phosphatase: 62 U/L (ref 39–117)
BUN: 6 mg/dL (ref 6–23)
BUN: 5 mg/dL — AB (ref 6–23)
CHLORIDE: 98 meq/L (ref 96–112)
CO2: 31 mEq/L (ref 19–32)
CO2: 26 meq/L (ref 19–32)
CREATININE: 0.51 mg/dL (ref 0.50–1.10)
Calcium: 8.2 mg/dL — ABNORMAL LOW (ref 8.4–10.5)
Calcium: 8 mg/dL — ABNORMAL LOW (ref 8.4–10.5)
Chloride: 95 mEq/L — ABNORMAL LOW (ref 96–112)
Creatinine, Ser: 0.56 mg/dL (ref 0.50–1.10)
GFR calc Af Amer: >90 mL/min (ref >90)
GFR calc non Af Amer: >90 mL/min (ref >90)
GLUCOSE: 128 mg/dL — AB (ref 70–99)
Glucose, Bld: 208 mg/dL — ABNORMAL HIGH (ref 70–99)
POTASSIUM: 4.6 meq/L (ref 3.7–5.3)
Potassium: 4.4 mEq/L (ref 3.7–5.3)
Sodium: 135 mEq/L — ABNORMAL LOW (ref 137–147)
Sodium: 134 mEq/L — ABNORMAL LOW (ref 137–147)
Total Protein: 7.4 g/dL (ref 6.0–8.3)
Total Protein: 6.7 g/dL (ref 6.0–8.3)

## 2013-03-23 LAB — RESPIRATORY VIRUS PANEL
ADENOVIRUS: NOT DETECTED
INFLUENZA A: NOT DETECTED
Influenza A H1: NOT DETECTED
Influenza A H3: NOT DETECTED
Influenza B: NOT DETECTED
Metapneumovirus: DETECTED — AB
PARAINFLUENZA 1 A: NOT DETECTED
PARAINFLUENZA 2 A: NOT DETECTED
Parainfluenza 3: NOT DETECTED
RESPIRATORY SYNCYTIAL VIRUS B: NOT DETECTED
Respiratory Syncytial Virus A: NOT DETECTED
Rhinovirus: NOT DETECTED

## 2013-03-23 LAB — INFLUENZA PANEL BY PCR (TYPE A & B)
H1N1 flu by pcr: NOT DETECTED
Influenza A By PCR: NEGATIVE
Influenza B By PCR: NEGATIVE

## 2013-03-23 LAB — LACTIC ACID, PLASMA: Lactic Acid, Venous: 0.8 mmol/L (ref 0.5–2.2)

## 2013-03-23 LAB — CREATININE, URINE, RANDOM: CREATININE, URINE: 17.9 mg/dL

## 2013-03-23 LAB — OSMOLALITY, URINE: Osmolality, Ur: 138 mOsm/kg — ABNORMAL LOW (ref 390–1090)

## 2013-03-23 LAB — TSH: TSH: 0.547 u[IU]/mL (ref 0.350–4.500)

## 2013-03-23 LAB — GLUCOSE, CAPILLARY: Glucose-Capillary: 219 mg/dL — ABNORMAL HIGH (ref 70–99)

## 2013-03-23 LAB — CBC
HCT: 37.7 % (ref 36.0–46.0)
HCT: 40 % (ref 36.0–46.0)
HEMOGLOBIN: 13.1 g/dL (ref 12.0–15.0)
Hemoglobin: 12.4 g/dL (ref 12.0–15.0)
MCH: 29.6 pg (ref 26.0–34.0)
MCH: 29.7 pg (ref 26.0–34.0)
MCHC: 32.8 g/dL (ref 30.0–36.0)
MCHC: 32.9 g/dL (ref 30.0–36.0)
MCV: 90.4 fL (ref 78.0–100.0)
MCV: 90.5 fL (ref 78.0–100.0)
PLATELETS: 138 10*3/uL — AB (ref 150–400)
Platelets: 170 10*3/uL (ref 150–400)
RBC: 4.17 MIL/uL (ref 3.87–5.11)
RBC: 4.42 MIL/uL (ref 3.87–5.11)
RDW: 14.1 % (ref 11.5–15.5)
RDW: 14.2 % (ref 11.5–15.5)
WBC: 4 10*3/uL (ref 4.0–10.5)
WBC: 4.4 10*3/uL (ref 4.0–10.5)

## 2013-03-23 LAB — MRSA PCR SCREENING: MRSA by PCR: NEGATIVE

## 2013-03-23 LAB — SODIUM, URINE, RANDOM

## 2013-03-23 LAB — PROCALCITONIN

## 2013-03-23 LAB — MAGNESIUM: MAGNESIUM: 1.6 mg/dL (ref 1.5–2.5)

## 2013-03-23 LAB — PHOSPHORUS: Phosphorus: 3.1 mg/dL (ref 2.3–4.6)

## 2013-03-23 MED ORDER — VANCOMYCIN HCL IN DEXTROSE 1-5 GM/200ML-% IV SOLN
1000.0000 mg | Freq: Once | INTRAVENOUS | Status: AC
  Administered 2013-03-23: 1000 mg via INTRAVENOUS
  Filled 2013-03-23: qty 200

## 2013-03-23 MED ORDER — HYDROMORPHONE HCL 2 MG PO TABS
4.0000 mg | ORAL_TABLET | ORAL | Status: DC | PRN
  Administered 2013-03-23 – 2013-03-24 (×3): 4 mg via ORAL
  Filled 2013-03-23 (×4): qty 2

## 2013-03-23 MED ORDER — SODIUM CHLORIDE 0.9 % IV SOLN
INTRAVENOUS | Status: DC
  Administered 2013-03-23 – 2013-03-26 (×4): via INTRAVENOUS

## 2013-03-23 MED ORDER — ENOXAPARIN SODIUM 40 MG/0.4ML ~~LOC~~ SOLN
40.0000 mg | SUBCUTANEOUS | Status: DC
  Administered 2013-03-23 – 2013-03-31 (×9): 40 mg via SUBCUTANEOUS
  Filled 2013-03-23 (×10): qty 0.4

## 2013-03-23 MED ORDER — DICYCLOMINE HCL 20 MG PO TABS
20.0000 mg | ORAL_TABLET | Freq: Three times a day (TID) | ORAL | Status: DC
  Administered 2013-03-23 – 2013-04-03 (×32): 20 mg via ORAL
  Filled 2013-03-23 (×37): qty 1

## 2013-03-23 MED ORDER — ASPIRIN EC 81 MG PO TBEC
81.0000 mg | DELAYED_RELEASE_TABLET | Freq: Every day | ORAL | Status: DC
  Administered 2013-03-23 – 2013-04-03 (×12): 81 mg via ORAL
  Filled 2013-03-23 (×12): qty 1

## 2013-03-23 MED ORDER — BUPROPION HCL ER (SR) 150 MG PO TB12
150.0000 mg | ORAL_TABLET | Freq: Two times a day (BID) | ORAL | Status: DC
  Administered 2013-03-23 – 2013-03-27 (×9): 150 mg via ORAL
  Filled 2013-03-23 (×11): qty 1

## 2013-03-23 MED ORDER — LEVALBUTEROL HCL 1.25 MG/0.5ML IN NEBU
1.2500 mg | INHALATION_SOLUTION | Freq: Four times a day (QID) | RESPIRATORY_TRACT | Status: DC
  Filled 2013-03-23 (×3): qty 0.5

## 2013-03-23 MED ORDER — IPRATROPIUM BROMIDE 0.02 % IN SOLN
0.5000 mg | Freq: Four times a day (QID) | RESPIRATORY_TRACT | Status: DC
  Administered 2013-03-23 – 2013-03-24 (×4): 0.5 mg via RESPIRATORY_TRACT
  Filled 2013-03-23 (×4): qty 2.5

## 2013-03-23 MED ORDER — POLYETHYLENE GLYCOL 3350 17 G PO PACK
17.0000 g | PACK | Freq: Every day | ORAL | Status: DC | PRN
  Filled 2013-03-23: qty 1

## 2013-03-23 MED ORDER — GUAIFENESIN ER 600 MG PO TB12
600.0000 mg | ORAL_TABLET | Freq: Two times a day (BID) | ORAL | Status: DC
  Administered 2013-03-23 – 2013-04-03 (×22): 600 mg via ORAL
  Filled 2013-03-23 (×24): qty 1

## 2013-03-23 MED ORDER — MOMETASONE FURO-FORMOTEROL FUM 100-5 MCG/ACT IN AERO
2.0000 | INHALATION_SPRAY | Freq: Two times a day (BID) | RESPIRATORY_TRACT | Status: DC
  Administered 2013-03-23 – 2013-03-24 (×3): 2 via RESPIRATORY_TRACT
  Filled 2013-03-23: qty 8.8

## 2013-03-23 MED ORDER — LORATADINE 10 MG PO TABS
10.0000 mg | ORAL_TABLET | Freq: Every evening | ORAL | Status: DC
  Administered 2013-03-23 – 2013-03-26 (×4): 10 mg via ORAL
  Filled 2013-03-23 (×6): qty 1

## 2013-03-23 MED ORDER — SODIUM CHLORIDE 0.9 % IV SOLN: INTRAVENOUS | Status: AC

## 2013-03-23 MED ORDER — PIPERACILLIN-TAZOBACTAM 3.375 G IVPB
3.3750 g | Freq: Three times a day (TID) | INTRAVENOUS | Status: DC
  Administered 2013-03-23 – 2013-03-26 (×10): 3.375 g via INTRAVENOUS
  Filled 2013-03-23 (×14): qty 50

## 2013-03-23 MED ORDER — DOCUSATE SODIUM 100 MG PO CAPS
100.0000 mg | ORAL_CAPSULE | Freq: Two times a day (BID) | ORAL | Status: DC
  Administered 2013-03-23 – 2013-04-03 (×23): 100 mg via ORAL
  Filled 2013-03-23 (×24): qty 1

## 2013-03-23 MED ORDER — OXYCODONE HCL 5 MG PO TABS: 15.0000 mg | ORAL_TABLET | ORAL | Status: DC | PRN

## 2013-03-23 MED ORDER — GUAIFENESIN-DM 100-10 MG/5ML PO SYRP
5.0000 mL | ORAL_SOLUTION | ORAL | Status: DC | PRN
  Administered 2013-03-23 – 2013-03-26 (×2): 5 mL via ORAL
  Filled 2013-03-23 (×2): qty 10

## 2013-03-23 MED ORDER — LEVALBUTEROL HCL 1.25 MG/0.5ML IN NEBU
1.2500 mg | INHALATION_SOLUTION | Freq: Four times a day (QID) | RESPIRATORY_TRACT | Status: DC
  Administered 2013-03-23 – 2013-03-24 (×3): 1.25 mg via RESPIRATORY_TRACT
  Filled 2013-03-23 (×7): qty 0.5

## 2013-03-23 MED ORDER — VANCOMYCIN HCL IN DEXTROSE 750-5 MG/150ML-% IV SOLN
750.0000 mg | Freq: Two times a day (BID) | INTRAVENOUS | Status: DC
  Administered 2013-03-23 – 2013-03-24 (×2): 750 mg via INTRAVENOUS
  Filled 2013-03-23 (×4): qty 150

## 2013-03-23 NOTE — ED Notes (Signed)
Pt assisted off bedpan-placed kleenex in bedpan-unable to get UA specimen for lab

## 2013-03-23 NOTE — ED Notes (Signed)
This RN notified Alvino Chapelllen, RN that the Pt's room should be available within 30 mins[facility]

## 2013-03-23 NOTE — ED Notes (Signed)
Explained to pt at length poc and apologized about the delay.  Await bed assignment which per CN should be made shortly

## 2013-03-23 NOTE — ED Notes (Signed)
Pt reporting headache 7/10

## 2013-03-23 NOTE — Progress Notes (Signed)
[facility] CL did not get to see patient but will be sending information about the [facility], using that address provided.

## 2013-03-23 NOTE — Progress Notes (Signed)
UR completed 

## 2013-03-23 NOTE — ED Notes (Signed)
ICU called to give report.  RN to call me back

## 2013-03-23 NOTE — Progress Notes (Signed)
[facility] PROGRESS NOTE  [name] RFX:[id number] DOB: [birth year] DOA: 03/22/2013 PCP: Kaleen Mask, MD  Assessment/Plan: Acute respiratory failure -Likely due to COPD exacerbation -Influenza PCR negative Influenza like illness/Fever -Continue Tamiflu for now -Continue vancomycin and Zosyn pending culture data patient took left over amoxicillin from one month ago COPD exacerbation -Continue Solu-Medrol 60 mg IV every 12 -Continue Dulera -Aerosolized albuterol and Atrovent Dehydration -IV fluids -FENa < 1% Chronic abdominal pain -Continue Bentyl Abnormal imaging of her right proximal humerus -patient states she had a fracture at that site many years ago she's currently asymptomatic reviewed this the radiologist who states it's not appear to be an acute fracture  tobacco abuse  -Continues to smoke one half pack per day  -Tobacco cessation discussed    Family Communication:   husband at beside Disposition Plan:   Home when medically stable   Antibiotics:  Vancomycin 03/22/2013>>>  Zosyn 03/22/2013>>>    Procedures/Studies: Dg Chest Port 1 View  03/22/2013   CLINICAL DATA:  Fever. Cough. Chills. Weakness. Shortness of breath.  EXAM: PORTABLE CHEST - 1 VIEW  COMPARISON:  10/30/2012  FINDINGS: Deformity of the proximal right humerus, incompletely imaged. Midline trachea. No pleural effusion or pneumothorax. Hyperinflation and diffuse interstitial thickening. Left upper lobe calcified granuloma. No lobar consolidation.  IMPRESSION: COPD/chronic bronchitis. No acute superimposed process.  Deformity of the proximal right humerus. Incompletely imaged. Correlate with Trauma.   Electronically Signed   By: Jeronimo Greaves M.D.   On: 03/22/2013 18:42         Subjective:  patient continues to have some shortness of breath but is a little bit better than at the time of admission. Complains of a nonproductive cough. Denies any chest discomfort, nausea, vomiting,  diarrhea, dysuria.  Objective: Filed Vitals:   03/23/13 1632 03/23/13 1700 03/23/13 1754 03/23/13 1800  BP: 113/51 105/52  89/58  Pulse: 97 91  97  Temp:      TempSrc:      Resp: 21 21  21   Height:      Weight:      SpO2: 98% 98% 97% 100%    Intake/Output Summary (Last 24 hours) at 03/23/13 1845 Last data filed at 03/23/13 1805  Gross per 24 hour  Intake    400 ml  Output   1625 ml  Net  -1225 ml   Weight change:  Exam:   General:  Pt is alert, follows commands appropriately, not in acute distress  HEENT: No icterus, No thrush, , [place]/AT  Cardiovascular: RRR, S1/S2, no rubs, no gallops  Respiratory: diminished breath sounds bilateral. Scattered rales bilaterally. Minimal wheezing at the bases.   Abdomen: Soft/+BS, non tender, non distended, no guarding  Extremities: No edema, No lymphangitis, No petechiae, No rashes, no synovitis  Data Reviewed: Basic Metabolic Panel:  Recent Labs Lab 03/22/13 1911 03/23/13 0005 03/23/13 0505  NA 132* 135* 134*  K 4.8 4.6 4.4  CL 94* 95* 98  CO2  --  31 26  GLUCOSE 125* 128* 208*  BUN 6 5* 6  CREATININE 0.70 0.56 0.51  CALCIUM  --  8.2* 8.0*  MG  --   --  1.6  PHOS  --   --  3.1   Liver Function Tests:  Recent Labs Lab 03/23/13 0005 03/23/13 0505  AST 32 31  ALT 19 18  ALKPHOS 68 62  BILITOT <0.2* <0.2*  PROT 7.4 6.7  ALBUMIN 3.5 3.1*   No results found for this basename: LIPASE,  AMYLASE,  in the last 168 hours No results found for this basename: AMMONIA,  in the last 168 hours CBC:  Recent Labs Lab 03/22/13 1815 03/22/13 1911 03/23/13 0005 03/23/13 0505  WBC 5.2  --  4.4 4.0  NEUTROABS 3.4  --   --   --   HGB 14.6 16.3* 13.1 12.4  HCT 43.5 48.0* 40.0 37.7  MCV 89.9  --  90.5 90.4  PLT 190  --  170 138*   Cardiac Enzymes: No results found for this basename: CKTOTAL, CKMB, CKMBINDEX, TROPONINI,  in the last 168 hours BNP: No components found with this basename: POCBNP,  CBG:  Recent Labs Lab  03/23/13 1140  GLUCAP 219*    Recent Results (from the past 240 hour(s))  MRSA PCR SCREENING     Status: None   Collection Time    03/23/13  3:54 PM      Result Value Range Status   MRSA by PCR NEGATIVE  NEGATIVE Final   Comment:            The GeneXpert MRSA Assay (FDA     approved for NASAL specimens     only), is one component of a     comprehensive MRSA colonization     surveillance program. It is not     intended to diagnose MRSA     infection nor to guide or     monitor treatment for     MRSA infections.     Scheduled Meds: . aspirin EC  81 mg Oral Daily  . buPROPion  150 mg Oral BID  . dicyclomine  20 mg Oral TID AC  . docusate sodium  100 mg Oral BID  . enoxaparin (LOVENOX) injection  40 mg Subcutaneous Q24H  . guaiFENesin  600 mg Oral BID  . ipratropium  0.5 mg Nebulization Q6H  . loratadine  10 mg Oral QPM  . methylPREDNISolone (SOLU-MEDROL) injection  60 mg Intravenous Q12H  . mometasone-formoterol  2 puff Inhalation BID  . oseltamivir  75 mg Oral BID  . pantoprazole  40 mg Oral Q0600  . piperacillin-tazobactam (ZOSYN)  IV  3.375 g Intravenous Q8H  . sodium chloride  3 mL Intravenous Q12H  . vancomycin  750 mg Intravenous Q12H   Continuous Infusions: . sodium chloride 125 mL/hr at 03/23/13 1114     Charlotte Brafford, DO  [facility] Pager [phone]  If 7PM-7AM, please contact night-coverage [email or web address] Password [email or web address] 03/23/2013, 6:45 PM   LOS: 1 day

## 2013-03-23 NOTE — ED Notes (Signed)
Called pharmacy for daily meds

## 2013-03-23 NOTE — Progress Notes (Signed)
D: 53yo WF pt admitted through ED 03/22/2013 with c/o SOB, in ARF likely due to COPD exacerbation.  Pt also with h/o TIA, stroke 2009, GERD, hepatomegaly, colonic inertia, PTSD, depression, bipolar disorder, lung abcess, chronic headache, seizures, fibromyalgia, anemia, arthritis, marijuana use, smoker, O2 use at home prn. Pt with c/o fibromyalgia pain. Pt states she received 50mcg of Fentanyl IV at 1819 which helped her headache some, but not her fibromyalgia pain.  A: reviewed MAR. Pt did receive dose of Fentanyl 50MCG IV at 1819.  Pt ordered oxycodone 15mg  po q4Hrs prn moderate pain.  Offered dose to pt.  Pt refused stating she was highly allergic to oxycodone and that she could only tolerate demerol, oxycontin or dilaudid.  Paged on call MD to discuss po pain management.  Requested to d/c oxycodone and start pt on Dilaudid po.   R: On call MD agreed to d/c oxycodone and start Dilaudid po prn.  Will administer dose of Dilaudid as soon as order is received.  Will cont to monitor pt status.  Marrion Coyarol Isaiahs Chancy RN

## 2013-03-23 NOTE — ED Notes (Signed)
Call to ICU to give report 

## 2013-03-23 NOTE — ED Notes (Signed)
Pt and Pt's family were concerned about her admission status/bed assignment and the care that she is receiving.  This RN explained that we have been working to get her upstairs and that I would follow up with the [facility].  Pt's husband is concerned that she is not getting all of the ordered medication.  This RN and Alvino Chapel, RN reassured them that she has been receiving everything that was ordered.

## 2013-03-23 NOTE — ED Notes (Signed)
AM EKG done per admission orders, no report of pain

## 2013-03-23 NOTE — Progress Notes (Addendum)
Called ED for report  

## 2013-03-23 NOTE — Progress Notes (Signed)
ANTIBIOTIC CONSULT NOTE - INITIAL  Pharmacy Consult for Vancomycin Indication: Febrile illness  Allergies  Allergen Reactions  . Oxycodone-Acetaminophen Anaphylaxis, Itching and Nausea And Vomiting    Tylox (Pt states, "I can only take Demerol, Oxycontin, and Dilaudid")  . Codeine Nausea And Vomiting  . Hydrocodone Nausea And Vomiting  . Quinolones Hives    Patient Measurements: Height: 5' 7.5" (171.5 cm) Weight: 130 lb (58.968 kg) IBW/kg (Calculated) : 62.75 Adjusted Body Weight:   Vital Signs: Temp: 99.2 F (37.3 C) (01/22 0130) Temp src: Oral (01/22 0130) BP: 110/62 mmHg (01/22 0530) Pulse Rate: 84 (01/22 0530) Intake/Output from previous day: 01/21 0701 - 01/22 0700 In: -  Out: 1000 [Urine:1000] Intake/Output from this shift: Total I/O In: -  Out: 1000 [Urine:1000]  Labs:  Recent Labs  03/22/13 1815 03/22/13 1911 03/23/13 0005 03/23/13 0505  WBC 5.2  --  4.4 4.0  HGB 14.6 16.3* 13.1 12.4  PLT 190  --  170 138*  CREATININE  --  0.70 0.56 0.51   Estimated Creatinine Clearance: 75.7 ml/min (by C-G formula based on Cr of 0.51). No results found for this basename: VANCOTROUGH, VANCOPEAK, VANCORANDOM, GENTTROUGH, GENTPEAK, GENTRANDOM, TOBRATROUGH, TOBRAPEAK, TOBRARND, AMIKACINPEAK, AMIKACINTROU, AMIKACIN,  in the last 72 hours   Microbiology: No results found for this or any previous visit (from the past 720 hour(s)).  Medical History: Past Medical History  Diagnosis Date  . TIA (transient ischemic attack)   . GERD (gastroesophageal reflux disease)   . Hepatomegaly     hx  . Colonic inertia   . Lung abscess     "calcified over"; bilateral   . COPD (chronic obstructive pulmonary disease)   . Stroke 2009    TIA  . PTSD (post-traumatic stress disorder)   . Bipolar disorder   . Depression   . Chronic headache   . Seizures     in past  . Fibromyalgia   . Iron deficiency anemia   . Arthritis   . Acute ischemic colitis 11/01/2012  . Marijuana abuse  2014    positive screen in hospital  . TIA (transient ischemic attack)     Medications:  Anti-infectives   Start     Dose/Rate Route Frequency Ordered Stop   03/23/13 1800  vancomycin (VANCOCIN) IVPB 750 mg/150 ml premix     750 mg 150 mL/hr over 60 Minutes Intravenous Every 12 hours 03/23/13 0632     03/23/13 0545  vancomycin (VANCOCIN) IVPB 1000 mg/200 mL premix     1,000 mg 200 mL/hr over 60 Minutes Intravenous  Once 03/23/13 0532     03/23/13 0530  piperacillin-tazobactam (ZOSYN) IVPB 3.375 g     3.375 g 12.5 mL/hr over 240 Minutes Intravenous 3 times per day 03/23/13 0515     03/22/13 2200  oseltamivir (TAMIFLU) capsule 75 mg     75 mg Oral 2 times daily 03/22/13 2148 03/27/13 2159   03/22/13 2030  levofloxacin (LEVAQUIN) IVPB 750 mg  Status:  Discontinued     750 mg 100 mL/hr over 90 Minutes Intravenous  Once 03/22/13 2024 03/22/13 2053     Assessment: Patient with febrile illness. First dose of antibiotics already sent to ED.  Goal of Therapy:  Vancomycin trough level 15-20 mcg/ml  Plan:  Measure antibiotic drug levels at steady state Follow up culture results Vancomycin 750mg  iv q12hr  Aleene Davidson Crowford 03/23/2013,6:33 AM

## 2013-03-23 NOTE — Progress Notes (Signed)
Report called from ED. Pt on the way.

## 2013-03-23 NOTE — ED Notes (Signed)
Gave pt a cool washcloth to help cool her down.  Also called for a fan from portable equipment and gave pt a ice pack and ice water.  Turned room temp down in room 16 to hopefully make her more comfortable.

## 2013-03-23 NOTE — Progress Notes (Signed)
On hold x 10 minutes for ED to get report - will wait for call back.

## 2013-03-23 NOTE — Progress Notes (Signed)
PT demonstrated verbal and hands on understanding of Flutter device. 

## 2013-03-24 DIAGNOSIS — J449 Chronic obstructive pulmonary disease, unspecified: Secondary | ICD-10-CM

## 2013-03-24 DIAGNOSIS — J441 Chronic obstructive pulmonary disease with (acute) exacerbation: Secondary | ICD-10-CM

## 2013-03-24 DIAGNOSIS — J1289 Other viral pneumonia: Secondary | ICD-10-CM

## 2013-03-24 DIAGNOSIS — J9600 Acute respiratory failure, unspecified whether with hypoxia or hypercapnia: Secondary | ICD-10-CM

## 2013-03-24 LAB — COMPREHENSIVE METABOLIC PANEL
ALK PHOS: 53 U/L (ref 39–117)
ALT: 26 U/L (ref 0–35)
AST: 41 U/L — ABNORMAL HIGH (ref 0–37)
Albumin: 3 g/dL — ABNORMAL LOW (ref 3.5–5.2)
BUN: 5 mg/dL — ABNORMAL LOW (ref 6–23)
CO2: 31 meq/L (ref 19–32)
Calcium: 8.4 mg/dL (ref 8.4–10.5)
Chloride: 95 mEq/L — ABNORMAL LOW (ref 96–112)
Creatinine, Ser: 0.54 mg/dL (ref 0.50–1.10)
GFR calc non Af Amer: >90 mL/min (ref >90)
GLUCOSE: 150 mg/dL — AB (ref 70–99)
Potassium: 4.1 mEq/L (ref 3.7–5.3)
SODIUM: 135 meq/L — AB (ref 137–147)
TOTAL PROTEIN: 6.7 g/dL (ref 6.0–8.3)
Total Bilirubin: <0.2 mg/dL — ABNORMAL LOW (ref 0.3–1.2)

## 2013-03-24 LAB — PROCALCITONIN: Procalcitonin: <0.1 ng/mL

## 2013-03-24 LAB — RAPID URINE DRUG SCREEN, HOSP PERFORMED
Amphetamines: NOT DETECTED
BARBITURATES: NOT DETECTED
Benzodiazepines: POSITIVE — AB
COCAINE: NOT DETECTED
Opiates: NOT DETECTED
Tetrahydrocannabinol: NOT DETECTED

## 2013-03-24 LAB — CBC
HEMATOCRIT: 35.3 % — AB (ref 36.0–46.0)
Hemoglobin: 11.5 g/dL — ABNORMAL LOW (ref 12.0–15.0)
MCH: 30 pg (ref 26.0–34.0)
MCHC: 32.6 g/dL (ref 30.0–36.0)
MCV: 92.2 fL (ref 78.0–100.0)
Platelets: 152 10*3/uL (ref 150–400)
RBC: 3.83 MIL/uL — ABNORMAL LOW (ref 3.87–5.11)
RDW: 14.1 % (ref 11.5–15.5)
WBC: 5.8 10*3/uL (ref 4.0–10.5)

## 2013-03-24 LAB — URINE CULTURE
COLONY COUNT: NO GROWTH
CULTURE: NO GROWTH
SPECIAL REQUESTS: NORMAL

## 2013-03-24 MED ORDER — TIOTROPIUM BROMIDE MONOHYDRATE 18 MCG IN CAPS
18.0000 ug | ORAL_CAPSULE | Freq: Every day | RESPIRATORY_TRACT | Status: DC
  Administered 2013-03-24 – 2013-03-25 (×2): 18 ug via RESPIRATORY_TRACT
  Filled 2013-03-24: qty 5

## 2013-03-24 MED ORDER — ARFORMOTEROL TARTRATE 15 MCG/2ML IN NEBU
15.0000 ug | INHALATION_SOLUTION | Freq: Two times a day (BID) | RESPIRATORY_TRACT | Status: DC
  Administered 2013-03-24 – 2013-03-25 (×2): 15 ug via RESPIRATORY_TRACT
  Filled 2013-03-24 (×4): qty 2

## 2013-03-24 MED ORDER — OXYMETAZOLINE HCL 0.05 % NA SOLN
1.0000 | Freq: Two times a day (BID) | NASAL | Status: DC
  Administered 2013-03-24 – 2013-03-27 (×6): 1 via NASAL
  Filled 2013-03-24: qty 15

## 2013-03-24 MED ORDER — FENTANYL CITRATE 0.05 MG/ML IJ SOLN
12.5000 ug | INTRAMUSCULAR | Status: DC | PRN
  Administered 2013-03-24 – 2013-03-26 (×5): 12.5 ug via INTRAVENOUS
  Filled 2013-03-24 (×5): qty 2

## 2013-03-24 MED ORDER — METHYLPREDNISOLONE SODIUM SUCC 40 MG IJ SOLR
40.0000 mg | Freq: Two times a day (BID) | INTRAMUSCULAR | Status: DC
  Administered 2013-03-24 – 2013-03-25 (×2): 40 mg via INTRAVENOUS
  Filled 2013-03-24 (×4): qty 1

## 2013-03-24 MED ORDER — BUDESONIDE 0.5 MG/2ML IN SUSP
0.5000 mg | Freq: Two times a day (BID) | RESPIRATORY_TRACT | Status: DC
  Administered 2013-03-24 – 2013-04-03 (×20): 0.5 mg via RESPIRATORY_TRACT
  Filled 2013-03-24 (×41): qty 2

## 2013-03-24 MED ORDER — PANTOPRAZOLE SODIUM 40 MG PO TBEC
40.0000 mg | DELAYED_RELEASE_TABLET | Freq: Two times a day (BID) | ORAL | Status: DC
  Administered 2013-03-24 – 2013-04-03 (×20): 40 mg via ORAL
  Filled 2013-03-24 (×25): qty 1

## 2013-03-24 MED ORDER — SALINE SPRAY 0.65 % NA SOLN
1.0000 | Freq: Four times a day (QID) | NASAL | Status: DC
  Administered 2013-03-24 – 2013-04-03 (×19): 1 via NASAL
  Filled 2013-03-24: qty 44

## 2013-03-24 MED ORDER — FLUTICASONE PROPIONATE 50 MCG/ACT NA SUSP
2.0000 | Freq: Two times a day (BID) | NASAL | Status: DC
  Administered 2013-03-24 – 2013-04-03 (×13): 2 via NASAL
  Filled 2013-03-24 (×2): qty 16

## 2013-03-24 MED ORDER — BOOST PLUS PO LIQD
237.0000 mL | Freq: Two times a day (BID) | ORAL | Status: DC
  Administered 2013-03-24 – 2013-04-03 (×17): 237 mL via ORAL
  Filled 2013-03-24 (×21): qty 237

## 2013-03-24 MED ORDER — SENNA 8.6 MG PO TABS
2.0000 | ORAL_TABLET | Freq: Every day | ORAL | Status: DC
  Administered 2013-03-24 – 2013-04-03 (×8): 17.2 mg via ORAL
  Filled 2013-03-24 (×9): qty 2

## 2013-03-24 NOTE — Progress Notes (Signed)
INITIAL NUTRITION ASSESSMENT  DOCUMENTATION CODES Per approved criteria  -Not Applicable   INTERVENTION: - Boost Plus BID - Encouraged increased meal intake as nausea resolves - Will continue to monitor   NUTRITION DIAGNOSIS: Inadequate oral intake related to nausea, poor appetite as evidenced by pt report.   Goal: 1. Resolution of nausea 2. Pt to consume >90% of meals/supplements  Monitor:  Weights, labs intake, nausea   Reason for Assessment: Malnutrition screening tool   53 y.o. female  Admitting Dx: cough and fever   ASSESSMENT: Pt with history of TIA (transient ischemic attack); GERD (gastroesophageal reflux disease); Hepatomegaly; Colonic inertia; Lung abscess; COPD (chronic obstructive pulmonary disease); Stroke (2009); PTSD (post-traumatic stress disorder); Bipolar disorder; Depression; Chronic headache; Seizures; Fibromyalgia; Iron deficiency anemia; Arthritis; Acute ischemic colitis (11/01/2012); Marijuana abuse (2014); and TIA (transient ischemic attack). Admitted with cough, worsening fever and shortness of breath that started 3 days PTA. Found to have acute respiratory failure likely due to COPD exacerbation.   Met with pt and husband who report pt has never been a big eater but has been drinking 2 Boost/day at home. States she lost her appetite in the past week. Reports 17 pound unintended weight loss since August of last year due to ongoing medical issues. Had some nausea this morning which she attributed to the Dilaudid. RN reports pt did well at dinner last night.    Height: Ht Readings from Last 1 Encounters:  03/23/13 5' 7.5" (1.715 m)    Weight: Wt Readings from Last 1 Encounters:  03/24/13 141 lb 12.1 oz (64.3 kg)    Ideal Body Weight: 135 lb   % Ideal Body Weight: 104%  Wt Readings from Last 10 Encounters:  03/24/13 141 lb 12.1 oz (64.3 kg)  01/10/13 134 lb 12.8 oz (61.145 kg)  12/27/12 131 lb 8 oz (59.648 kg)  10/30/12 127 lb 6.8 oz (57.8 kg)   10/30/12 127 lb 6.8 oz (57.8 kg)  10/13/12 127 lb 6.4 oz (57.788 kg)  07/13/12 138 lb (62.596 kg)  02/18/12 134 lb (60.782 kg)  12/23/11 134 lb (60.782 kg)  12/23/11 134 lb (60.782 kg)    Usual Body Weight: 158 lb per pt  % Usual Body Weight: 89%  BMI:  Body mass index is 21.86 kg/(m^2).  Estimated Nutritional Needs: Kcal: 1600-1800 Protein: 75-90g Fluid: 1.6-1.8L/day   Skin: Intact   Diet Order: General  EDUCATION NEEDS: -No education needs identified at this time   Intake/Output Summary (Last 24 hours) at 03/24/13 1213 Last data filed at 03/24/13 1000  Gross per 24 hour  Intake   2628 ml  Output   2425 ml  Net    203 ml    Last BM: 1/21   Labs:   Recent Labs Lab 03/23/13 0005 03/23/13 0505 03/24/13 0406  NA 135* 134* 135*  K 4.6 4.4 4.1  CL 95* 98 95*  CO2 31 26 31   BUN 5* 6 5*  CREATININE 0.56 0.51 0.54  CALCIUM 8.2* 8.0* 8.4  MG  --  1.6  --   PHOS  --  3.1  --   GLUCOSE 128* 208* 150*    CBG (last 3)   Recent Labs  03/23/13 1140  GLUCAP 219*    Scheduled Meds: . aspirin EC  81 mg Oral Daily  . buPROPion  150 mg Oral BID  . dicyclomine  20 mg Oral TID AC  . docusate sodium  100 mg Oral BID  . enoxaparin (LOVENOX) injection  40  mg Subcutaneous Q24H  . guaiFENesin  600 mg Oral BID  . ipratropium  0.5 mg Nebulization Q6H  . levalbuterol  1.25 mg Nebulization Q6H  . loratadine  10 mg Oral QPM  . methylPREDNISolone (SOLU-MEDROL) injection  60 mg Intravenous Q12H  . mometasone-formoterol  2 puff Inhalation BID  . pantoprazole  40 mg Oral Q0600  . piperacillin-tazobactam (ZOSYN)  IV  3.375 g Intravenous Q8H  . senna  2 tablet Oral Daily  . sodium chloride  3 mL Intravenous Q12H    Continuous Infusions: . sodium chloride 75 mL/hr at 03/23/13 2012    Past Medical History  Diagnosis Date  . TIA (transient ischemic attack)   . GERD (gastroesophageal reflux disease)   . Hepatomegaly     hx  . Colonic inertia   . Lung abscess      "calcified over"; bilateral   . COPD (chronic obstructive pulmonary disease)   . Stroke 2009    TIA  . PTSD (post-traumatic stress disorder)   . Bipolar disorder   . Depression   . Chronic headache   . Seizures     in past  . Fibromyalgia   . Iron deficiency anemia   . Arthritis   . Acute ischemic colitis 11/01/2012  . Marijuana abuse 2014    positive screen in hospital  . TIA (transient ischemic attack)     Past Surgical History  Procedure Laterality Date  . 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 Cesarean section      x 2  . Esophagogastroduodenoscopy  12/23/2011    Procedure: ESOPHAGOGASTRODUODENOSCOPY (EGD);  Surgeon: Lafayette Dragon, MD;  Location: [facility];  Service: Endoscopy;  Laterality: N/A;  . Colonoscopy    . Colonoscopy N/A 11/01/2012    Procedure: COLONOSCOPY;  Surgeon: Gatha Mayer, MD;  Location: [facility];  Service: Endoscopy;  Laterality: N/A;    Mikey College MS, [place], [place] Pager [phone] After Hours Pager

## 2013-03-24 NOTE — Progress Notes (Signed)
[facility] PROGRESS NOTE  [name]:[id number] DOB: [birth year] DOA: 03/22/2013 PCP: Leonard Downing, MD  Assessment/Plan: Acute respiratory failure  -Likely due to COPD exacerbation  -Influenza PCR negative  -respiratory virus panel-->+metapneumovirus -continue supplemental oxygen -consult pulmonary Influenza like illness/Fever  -d/c Tamiflu  -Continue  Zosyn pending culture data patient took left over amoxicillin from one month ago   -d/c vanco COPD exacerbation  -Continue Solu-Medrol 60 mg IV every 12  -Continue Dulera  -Aerosolized xopenex and Atrovent  Dehydration  -IV fluids  -FENa < 1%  Chronic abdominal pain  -Continue Bentyl  Abnormal imaging of her right proximal humerus  -patient states she had a fracture at that site many years ago she's currently asymptomatic reviewed this the radiologist who states it's not appear to be an acute fracture  tobacco abuse  -Continues to smoke one half pack per day  -Tobacco cessation discussed  Chronic pain syndrome/fibromyalgia -pt has narcotic seeking behavior -I told patient I will not increase fentanyl any further from 12.46mg every 4 hrs   Family Communication:   Husband at beside Disposition Plan:   Home when medically stable   Antibiotics:  Vancomycin 03/23/2013>>> 03/24/2013  Zosyn 03/23/2013>>>  Tamiflu 03/23/2013>>> 03/24/2013    Procedures/Studies: Dg Chest Port 1 View  03/22/2013   CLINICAL DATA:  Fever. Cough. Chills. Weakness. Shortness of breath.  EXAM: PORTABLE CHEST - 1 VIEW  COMPARISON:  10/30/2012  FINDINGS: Deformity of the proximal right humerus, incompletely imaged. Midline trachea. No pleural effusion or pneumothorax. Hyperinflation and diffuse interstitial thickening. Left upper lobe calcified granuloma. No lobar consolidation.  IMPRESSION: COPD/chronic bronchitis. No acute superimposed process.  Deformity of the proximal right humerus. Incompletely imaged. Correlate with Trauma.    Electronically Signed   By: KAbigail MiyamotoM.D.   On: 03/22/2013 18:42         Subjective: Patient complains of dyspnea on exertion with minimal exertion. Continues to have a nonproductive cough. Denies any fevers, chills, chest discomfort, vomiting, diarrhea, abdominal pain. She continues to complain of chronic back pain.  Objective: Filed Vitals:   03/24/13 0514 03/24/13 0600 03/24/13 0745 03/24/13 0800  BP:    131/67  Pulse: 88 96  103  Temp:    97.6 F (36.4 C)  TempSrc:    Axillary  Resp: 17 17  29   Height:      Weight:      SpO2: 97% 96% 90% 96%    Intake/Output Summary (Last 24 hours) at 03/24/13 1105 Last data filed at 03/24/13 1000  Gross per 24 hour  Intake   2628 ml  Output   2425 ml  Net    203 ml   Weight change: 5.332 kg (11 lb 12.1 oz) Exam:   General:  Pt is alert, follows commands appropriately, not in acute distress  HEENT: No icterus, No thrush, [place]/AT  Cardiovascular: RRR, S1/S2, no rubs, no gallops  Respiratory: Diminished breath sounds bilateral. Bibasilar wheezing. Scattered rails bilateral.  Abdomen: Soft/+BS, non tender, non distended, no guarding  Extremities: No edema, No lymphangitis, No petechiae, No rashes, no synovitis  Data Reviewed: Basic Metabolic Panel:  Recent Labs Lab 03/22/13 1911 03/23/13 0005 03/23/13 0505 03/24/13 0406  NA 132* 135* 134* 135*  K 4.8 4.6 4.4 4.1  CL 94* 95* 98 95*  CO2  --  31 26 31   GLUCOSE 125* 128* 208* 150*  BUN 6 5* 6 5*  CREATININE 0.70 0.56 0.51 0.54  CALCIUM  --  8.2* 8.0*  8.4  MG  --   --  1.6  --   PHOS  --   --  3.1  --    Liver Function Tests:  Recent Labs Lab 03/23/13 0005 03/23/13 0505 03/24/13 0406  AST 32 31 41*  ALT 19 18 26   ALKPHOS 68 62 53  BILITOT <0.2* <0.2* <0.2*  PROT 7.4 6.7 6.7  ALBUMIN 3.5 3.1* 3.0*   No results found for this basename: LIPASE, AMYLASE,  in the last 168 hours No results found for this basename: AMMONIA,  in the last 168  hours CBC:  Recent Labs Lab 03/22/13 1815 03/22/13 1911 03/23/13 0005 03/23/13 0505 03/24/13 0406  WBC 5.2  --  4.4 4.0 5.8  NEUTROABS 3.4  --   --   --   --   HGB 14.6 16.3* 13.1 12.4 11.5*  HCT 43.5 48.0* 40.0 37.7 35.3*  MCV 89.9  --  90.5 90.4 92.2  PLT 190  --  170 138* 152   Cardiac Enzymes: No results found for this basename: CKTOTAL, CKMB, CKMBINDEX, TROPONINI,  in the last 168 hours BNP: No components found with this basename: POCBNP,  CBG:  Recent Labs Lab 03/23/13 1140  GLUCAP 219*    Recent Results (from the past 240 hour(s))  CULTURE, BLOOD (ROUTINE X 2)     Status: None   Collection Time    03/22/13 11:50 PM      Result Value Range Status   Specimen Description BLOOD LEFT ANTECUBITAL   Final   Special Requests BOTTLES DRAWN AEROBIC AND ANAEROBIC 5CC   Final   Culture  Setup Time     Final   Value: 03/23/2013 03:05     Performed at [facility]   Culture     Final   Value:        BLOOD CULTURE RECEIVED NO GROWTH TO DATE CULTURE WILL BE HELD FOR 5 DAYS BEFORE ISSUING A FINAL NEGATIVE REPORT     Performed at [facility]   Report Status PENDING   Incomplete  CULTURE, BLOOD (ROUTINE X 2)     Status: None   Collection Time    03/23/13 12:05 AM      Result Value Range Status   Specimen Description BLOOD RIGHT FOREARM   Final   Special Requests BOTTLES DRAWN AEROBIC AND ANAEROBIC 5CC   Final   Culture  Setup Time     Final   Value: 03/23/2013 03:06     Performed at [facility]   Culture     Final   Value:        BLOOD CULTURE RECEIVED NO GROWTH TO DATE CULTURE WILL BE HELD FOR 5 DAYS BEFORE ISSUING A FINAL NEGATIVE REPORT     Performed at [facility]   Report Status PENDING   Incomplete  URINE CULTURE     Status: None   Collection Time    03/23/13  6:31 AM      Result Value Range Status   Specimen Description URINE, CLEAN CATCH   Final   Special Requests Normal   Final   Culture  Setup Time     Final   Value:  03/23/2013 08:57     Performed at [place]     Final   Value: NO GROWTH     Performed at [facility]   Culture     Final   Value: NO GROWTH  Performed at [facility]   Report Status 03/24/2013 FINAL   Final  RESPIRATORY VIRUS PANEL     Status: Abnormal   Collection Time    03/23/13  8:16 AM      Result Value Range Status   Source - [facility] NASAL SWAB   Corrected   Comment: CORRECTED ON 01/22 AT 2006: PREVIOUSLY REPORTED AS NASAL SWAB   Respiratory Syncytial Virus A NOT DETECTED   Final   Respiratory Syncytial Virus B NOT DETECTED   Final   Influenza A NOT DETECTED   Final   Influenza B NOT DETECTED   Final   Parainfluenza 1 NOT DETECTED   Final   Parainfluenza 2 NOT DETECTED   Final   Parainfluenza 3 NOT DETECTED   Final   Metapneumovirus DETECTED (*)  Final   Rhinovirus NOT DETECTED   Final   Adenovirus NOT DETECTED   Final   Influenza A H1 NOT DETECTED   Final   Influenza A H3 NOT DETECTED   Final   Comment: (NOTE)           Normal Reference Range for each Analyte: NOT DETECTED     Testing performed using the Luminex xTAG Respiratory Viral Panel test     kit.     This test was developed and its performance characteristics determined     by [facility]. It has not been cleared or approved by the [place]     Food and Drug Administration. This test is used for clinical purposes.     It should not be regarded as investigational or for research. This     laboratory is certified under the [place] (CLIA) as qualified to perform high complexity     clinical laboratory testing.     Performed at [facility] PCR SCREENING     Status: None   Collection Time    03/23/13  3:54 PM      Result Value Range Status   MRSA by PCR NEGATIVE  NEGATIVE Final   Comment:            The GeneXpert MRSA Assay (FDA     approved for NASAL specimens     only), is one component of a      comprehensive MRSA colonization     surveillance program. It is not     intended to diagnose MRSA     infection nor to guide or     monitor treatment for     MRSA infections.     Scheduled Meds: . aspirin EC  81 mg Oral Daily  . buPROPion  150 mg Oral BID  . dicyclomine  20 mg Oral TID AC  . docusate sodium  100 mg Oral BID  . enoxaparin (LOVENOX) injection  40 mg Subcutaneous Q24H  . guaiFENesin  600 mg Oral BID  . ipratropium  0.5 mg Nebulization Q6H  . levalbuterol  1.25 mg Nebulization Q6H  . loratadine  10 mg Oral QPM  . methylPREDNISolone (SOLU-MEDROL) injection  60 mg Intravenous Q12H  . mometasone-formoterol  2 puff Inhalation BID  . oseltamivir  75 mg Oral BID  . pantoprazole  40 mg Oral Q0600  . piperacillin-tazobactam (ZOSYN)  IV  3.375 g Intravenous Q8H  . sodium chloride  3 mL Intravenous Q12H  . vancomycin  750 mg Intravenous Q12H   Continuous Infusions: . sodium chloride 75 mL/hr at 03/23/13 2012  Dallys Nowakowski, DO  [facility] Pager [phone]  If 7PM-7AM, please contact night-coverage [email or web address] Password [email or web address] 03/24/2013, 11:05 AM   LOS: 2 days

## 2013-03-24 NOTE — Consult Note (Signed)
Name: [name] MRN: [id number] DOB: 10-01-1960    ADMISSION DATE:  03/22/2013 CONSULTATION DATE:  1/22  REFERRING MD :  [name] PRIMARY SERVICE:  [facility]  CHIEF COMPLAINT:  Acute respiratory failure COPD   BRIEF PATIENT DESCRIPTION:  53 year old pt f/b [place] for COPD, w/ FEV1 38% predicted and AAT on lower end of nml, still actively smoking/ on 2 liters PRN.  Admitted on 1/21 w/ AECOPD.  Room air sats 54%. Dx eval positive for Metapneumovirus.  PCCM asked to see for further recs re: her respiratory care and given concern that she would be high risk for progressive respiratory failure.   SIGNIFICANT EVENTS / STUDIES:   LINES / TUBES:  CULTURES: resp viral screen 1/22: metapneumovirus  Sputum 1/22>>> 1/22: UC: neg  1/22 BCx 2>>>  ANTIBIOTICS: tamiflu 1/21>>>1/22 vanc 1/21>>1/22 Zosyn 1/21>>>  HISTORY OF PRESENT ILLNESS:   53 year old pt f/b [place] for COPD, w/ FEV1 38% predicted and AAT on lower end of nml, still actively smoking/ on 2 liters PRN.  Admitted on 1/21 w/ 3d h/o cough, fever and increased SOB. Presented to Room air sats 54%. Had been on amoxicillin since onset of symptoms, worsened in spite of this. No sig improvement w/ SABA. Was admitted for AECOPD. Dx eval positive for Metapneumovirus. Treatment to date: IV abx, tamiflu which has since been d/c'd, systemic steroids and inhaled BDs. PCCM asked to see for further recs re: her respiratory care and given concern that she would be high risk for progressive respiratory failure.   PAST MEDICAL HISTORY :  Past Medical History  Diagnosis Date  . TIA (transient ischemic attack)   . GERD (gastroesophageal reflux disease)   . Hepatomegaly     hx  . Colonic inertia   . Lung abscess     "calcified over"; bilateral   . COPD (chronic obstructive pulmonary disease)   . Stroke 2009    TIA  . PTSD (post-traumatic stress disorder)   . Bipolar disorder   . Depression   . Chronic headache   . Seizures     in past  . Fibromyalgia    . Iron deficiency anemia   . Arthritis   . Acute ischemic colitis 11/01/2012  . Marijuana abuse 2014    positive screen in hospital  . TIA (transient ischemic attack)    Past Surgical History  Procedure Laterality Date  . 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 Cesarean section      x 2  . Esophagogastroduodenoscopy  12/23/2011    Procedure: ESOPHAGOGASTRODUODENOSCOPY (EGD);  Surgeon: Hart Carwin, MD;  Location: [facility];  Service: Endoscopy;  Laterality: N/A;  . Colonoscopy    . Colonoscopy N/A 11/01/2012    Procedure: COLONOSCOPY;  Surgeon: Iva Boop, MD;  Location: [facility];  Service: Endoscopy;  Laterality: N/A;   Prior to Admission medications   Medication Sig Start Date End Date Taking? Authorizing Provider  acetaminophen (TYLENOL) 500 MG tablet Take 1,000 mg by mouth every 6 (six) hours as needed for moderate pain.   Yes Historical Provider, MD  albuterol (PROVENTIL) (2.5 MG/3ML) 0.083% nebulizer solution Take 2.5 mg by nebulization every 6 (six) hours as needed for shortness of breath. For shortness of breath   Yes Historical Provider, MD  albuterol (VENTOLIN HFA) 108 (90 BASE) MCG/ACT inhaler Inhale 2 puffs into the lungs every 6 (six) hours as needed for wheezing  or shortness of breath.    Yes Historical Provider, MD  amoxicillin (AMOXIL) 500 MG capsule Take 1,000 mg by mouth 3 (three) times daily.   Yes Historical Provider, MD  aspirin EC 81 MG tablet Take 1 tablet (81 mg total) by mouth daily. 10/14/12  Yes Costin Otelia Sergeant, MD  buPROPion (WELLBUTRIN SR) 150 MG 12 hr tablet Take 150 mg by mouth 2 (two) times daily.   Yes Historical Provider, MD  cyclobenzaprine (FLEXERIL) 5 MG tablet Take 1 tablet (5 mg total) by mouth 3 (three) times daily as needed for muscle spasms. 10/14/12  Yes Costin Otelia Sergeant, MD  diazepam (VALIUM) 10 MG tablet Take 1 tablet (10 mg total) by mouth every 12 (twelve) hours as needed for anxiety. 11/02/12  Yes  Dorothea Ogle, MD  dicyclomine (BENTYL) 20 MG tablet Take 1 tablet (20 mg total) by mouth 3 (three) times daily before meals. 11/02/12  Yes Dorothea Ogle, MD  ibuprofen (ADVIL,MOTRIN) 200 MG tablet Take 600 mg by mouth every 6 (six) hours as needed for fever.   Yes Historical Provider, MD  loratadine (CLARITIN) 10 MG tablet Take 10 mg by mouth every evening.    Yes Historical Provider, MD  mometasone-formoterol (DULERA) 100-5 MCG/ACT AERO Inhale 2 puffs into the lungs 2 (two) times daily. 02/10/13  Yes Barbaraann Share, MD  oxyCODONE (ROXICODONE) 15 MG immediate release tablet Take 15 mg by mouth every 4 (four) hours as needed for pain (back pain).   Yes Historical Provider, MD  pantoprazole (PROTONIX) 40 MG tablet Take 1 tablet (40 mg total) by mouth daily at 6 (six) AM. 11/02/12  Yes Dorothea Ogle, MD  polyethylene glycol (MIRALAX / Ethelene Hal) packet Take 17 g by mouth daily as needed (constipation).   Yes Historical Provider, MD  promethazine (PHENERGAN) 25 MG tablet Take 0.5-1 tablets (12.5-25 mg total) by mouth every 6 (six) hours as needed for nausea. 11/02/12  Yes Dorothea Ogle, MD  SPIRIVA HANDIHALER 18 MCG inhalation capsule inhale contents of 1 capsule by mouth once daily 12/01/12  Yes Barbaraann Share, MD   Allergies  Allergen Reactions  . Oxycodone-Acetaminophen Anaphylaxis, Itching and Nausea And Vomiting    Tylox (Pt states, "I can only take Demerol, Oxycontin, and Dilaudid")  . Codeine Nausea And Vomiting  . Hydrocodone Nausea And Vomiting  . Quinolones Hives    FAMILY HISTORY:  Family History  Problem Relation Age of Onset  . Emphysema Father   . Asthma Son   . Clotting disorder Mother   . Rheum arthritis Sister   . Rheum arthritis Brother   . Ovarian cancer Sister    SOCIAL HISTORY:  reports that she has been smoking Cigarettes.  She has a 39 pack-year smoking history. She has never used smokeless tobacco. She reports that she does not drink alcohol or use illicit drugs.  Review  of Systems:   Bolds are positive  Constitutional: weight loss, gain, night sweats, Fevers, chills, fatigue .  HEENT: headaches, Sore throat, sneezing, nasal congestion, post nasal drip, Difficulty swallowing, Tooth/dental problems, visual complaints visual changes, ear ache CV:  chest pain, radiates: ,Orthopnea, PND, swelling in lower extremities, dizziness, palpitations, syncope.  GI  heartburn, indigestion, abdominal pain, nausea, vomiting, diarrhea, change in bowel habits, loss of appetite, bloody stools.  Resp: cough, non-productive: , hemoptysis, dyspnea, chest pain, pleuritic.  Skin: rash or itching or icterus GU: dysuria, change in color of urine, urgency or frequency. flank pain, hematuria  MS:  joint pain or swelling. decreased range of motion  Psych: change in mood or affect. depression or anxiety.  Neuro: difficulty with speech, weakness, numbness, ataxia    SUBJECTIVE:  Still congested, not able to produce sputum   VITAL SIGNS: Temp:  [97.2 F (36.2 C)-98.7 F (37.1 C)] 97.7 F (36.5 C) (01/23 1200) Pulse Rate:  [86-117] 103 (01/23 0800) Resp:  [17-29] 29 (01/23 0800) BP: (89-132)/(39-67) 131/67 mmHg (01/23 0800) SpO2:  [69 %-100 %] 96 % (01/23 0800) FiO2 (%):  [40 %] 40 % (01/22 2015) Weight:  [64.3 kg (141 lb 12.1 oz)] 64.3 kg (141 lb 12.1 oz) (01/23 0405) 2 liters [place]  PHYSICAL EXAMINATION: General:  Chronically ill appearing female, resp efforts labored while talking Neuro:  Awake, oriented, no focal def  HEENT:  [place], no JVD  Cardiovascular: regular, tachycardic, no murmur Lungs: poor air movement, no wheeze Abdomen:  Soft, non-tender  Musculoskeletal:  Intact  Skin:  Intact    Recent Labs Lab 03/23/13 0005 03/23/13 0505 03/24/13 0406  NA 135* 134* 135*  K 4.6 4.4 4.1  CL 95* 98 95*  CO2 31 26 31   BUN 5* 6 5*  CREATININE 0.56 0.51 0.54  GLUCOSE 128* 208* 150*    Recent Labs Lab 03/23/13 0005 03/23/13 0505 03/24/13 0406  HGB 13.1 12.4 11.5*    HCT 40.0 37.7 35.3*  WBC 4.4 4.0 5.8  PLT 170 138* 152   Dg Chest Port 1 View  03/22/2013   CLINICAL DATA:  Fever. Cough. Chills. Weakness. Shortness of breath.  EXAM: PORTABLE CHEST - 1 VIEW  COMPARISON:  10/30/2012  FINDINGS: Deformity of the proximal right humerus, incompletely imaged. Midline trachea. No pleural effusion or pneumothorax. Hyperinflation and diffuse interstitial thickening. Left upper lobe calcified granuloma. No lobar consolidation.  IMPRESSION: COPD/chronic bronchitis. No acute superimposed process.  Deformity of the proximal right humerus. Incompletely imaged. Correlate with Trauma.   Electronically Signed   By: Jeronimo GreavesKyle  Talbot M.D.   On: 03/22/2013 18:42    ASSESSMENT / PLAN: Acute respiratory failure in the setting of AECOPD baseline FEV1 38%, active smoker, + metapneumovirus complicated by Sinusitis and GERD as contributing factors Plan: Supplemental oxygen, needs to be humidified Nasal saline-->afrin-->nasal steroid Changed dulera to neb brovana/budesonide Spiriva Escalate GERD rx Continue zosyn for now  Flutter and IS  Taper steroids as tolerated  [facility] Care Medicine [facility] Pager: [phone]  03/24/2013, 1:13 PM  Reviewed above, examined pt, and agree with assessment/plan.  53 yo female smoker with hx of GOLD 4 COPD with emphysema and prior hx of H1N1, and lung abscess now with A on C respiratory failure from metapneumovirus with AECOPD.  Also complicated by upper airway cough syndrome and GERD.  Will adjust BD regimen.  Abx per primary team >> likely can narrow, or d/c soon.  Will adjust sinus regimen and tx for GERD.  Needs smoking cessation >> if she can stop smoking, then might be able to consider lung transplant evaluation at some point.  Continue wellbutrin.  Coralyn HellingVineet Zynia Wojtowicz, MD [facility] 03/24/2013, 2:01 PM Pager:  [phone] After 3pm call: [phone]

## 2013-03-25 DIAGNOSIS — J208 Acute bronchitis due to other specified organisms: Secondary | ICD-10-CM | POA: Diagnosis present

## 2013-03-25 DIAGNOSIS — B9781 Human metapneumovirus as the cause of diseases classified elsewhere: Secondary | ICD-10-CM | POA: Diagnosis present

## 2013-03-25 DIAGNOSIS — J209 Acute bronchitis, unspecified: Secondary | ICD-10-CM

## 2013-03-25 DIAGNOSIS — J9620 Acute and chronic respiratory failure, unspecified whether with hypoxia or hypercapnia: Secondary | ICD-10-CM | POA: Diagnosis present

## 2013-03-25 LAB — BASIC METABOLIC PANEL
BUN: 9 mg/dL (ref 6–23)
CO2: 32 meq/L (ref 19–32)
CREATININE: 0.62 mg/dL (ref 0.50–1.10)
Calcium: 8.2 mg/dL — ABNORMAL LOW (ref 8.4–10.5)
Chloride: 99 mEq/L (ref 96–112)
GFR calc Af Amer: >90 mL/min (ref >90)
GFR calc non Af Amer: >90 mL/min (ref >90)
Glucose, Bld: 156 mg/dL — ABNORMAL HIGH (ref 70–99)
Potassium: 4.5 mEq/L (ref 3.7–5.3)
SODIUM: 139 meq/L (ref 137–147)

## 2013-03-25 LAB — CBC
HEMATOCRIT: 35.9 % — AB (ref 36.0–46.0)
Hemoglobin: 11.4 g/dL — ABNORMAL LOW (ref 12.0–15.0)
MCH: 29.9 pg (ref 26.0–34.0)
MCHC: 31.8 g/dL (ref 30.0–36.0)
MCV: 94.2 fL (ref 78.0–100.0)
Platelets: 158 10*3/uL (ref 150–400)
RBC: 3.81 MIL/uL — AB (ref 3.87–5.11)
RDW: 14.2 % (ref 11.5–15.5)
WBC: 8 10*3/uL (ref 4.0–10.5)

## 2013-03-25 MED ORDER — MIDAZOLAM HCL 5 MG/ML IJ SOLN: 1.0000 mg | INTRAMUSCULAR | Status: DC | PRN

## 2013-03-25 MED ORDER — ALBUTEROL SULFATE (2.5 MG/3ML) 0.083% IN NEBU
2.5000 mg | INHALATION_SOLUTION | RESPIRATORY_TRACT | Status: DC
  Administered 2013-03-25 – 2013-03-27 (×22): 2.5 mg via RESPIRATORY_TRACT
  Filled 2013-03-25 (×22): qty 3

## 2013-03-25 MED ORDER — HYDROCOD POLST-CHLORPHEN POLST 10-8 MG/5ML PO LQCR
5.0000 mL | Freq: Two times a day (BID) | ORAL | Status: DC
  Administered 2013-03-25 – 2013-03-26 (×3): 5 mL via ORAL
  Filled 2013-03-25 (×3): qty 5

## 2013-03-25 MED ORDER — MORPHINE SULFATE 2 MG/ML IJ SOLN
INTRAMUSCULAR | Status: AC
  Filled 2013-03-25: qty 1

## 2013-03-25 MED ORDER — METHYLPREDNISOLONE SODIUM SUCC 125 MG IJ SOLR
80.0000 mg | Freq: Three times a day (TID) | INTRAMUSCULAR | Status: DC
  Administered 2013-03-25 – 2013-03-27 (×6): 80 mg via INTRAVENOUS
  Filled 2013-03-25 (×7): qty 1.28
  Filled 2013-03-25: qty 2
  Filled 2013-03-25 (×2): qty 1.28
  Filled 2013-03-25: qty 2

## 2013-03-25 MED ORDER — MORPHINE SULFATE 2 MG/ML IJ SOLN
2.0000 mg | INTRAMUSCULAR | Status: DC | PRN
  Administered 2013-03-25 – 2013-03-26 (×4): 2 mg via INTRAVENOUS
  Filled 2013-03-25 (×4): qty 1

## 2013-03-25 MED ORDER — MAGNESIUM SULFATE 40 MG/ML IJ SOLN
2.0000 g | Freq: Once | INTRAMUSCULAR | Status: AC
  Administered 2013-03-25: 2 g via INTRAVENOUS
  Filled 2013-03-25: qty 50

## 2013-03-25 MED ORDER — MORPHINE SULFATE 2 MG/ML IJ SOLN
2.0000 mg | INTRAMUSCULAR | Status: AC
  Administered 2013-03-25: 2 mg via INTRAVENOUS

## 2013-03-25 MED ORDER — IPRATROPIUM BROMIDE 0.02 % IN SOLN
0.5000 mg | RESPIRATORY_TRACT | Status: DC
  Administered 2013-03-25 – 2013-03-27 (×12): 0.5 mg via RESPIRATORY_TRACT
  Filled 2013-03-25 (×12): qty 2.5

## 2013-03-25 MED ORDER — MIDAZOLAM HCL 2 MG/2ML IJ SOLN
1.0000 mg | INTRAMUSCULAR | Status: DC | PRN
  Administered 2013-03-25 (×2): 2 mg via INTRAVENOUS
  Administered 2013-03-25 – 2013-03-26 (×3): 1 mg via INTRAVENOUS
  Administered 2013-03-26 (×2): 2 mg via INTRAVENOUS
  Filled 2013-03-25 (×7): qty 2

## 2013-03-25 NOTE — Progress Notes (Signed)
[facility] PROGRESS NOTE  DIVINE IMBER OVZ:[id number] DOB: 17-Jun-1960 DOA: 03/22/2013 PCP: Leonard Downing, MD  Assessment/Plan: Acute on chronic respiratory failure  -Likely due to COPD exacerbation and metapneumoviral infection -Influenza PCR negative  -respiratory virus panel-->+metapneumovirus  -continue supplemental oxygen  -appreciate PCCM -increase solumedrol -BiPAP, Brovana, pulmicort per PCCM Influenza like illness/Fever/Metapneumovirus infection  -d/c Tamiflu  -Continue Zosyn pending culture data patient took left over amoxicillin from one month ago  -d/c vanco  COPD exacerbation  -Continue Solu-Medrol 80 mg IV every 8 -baseline FEV1= 38% -Aerosolized xopenex and Atrovent  -had decompensation 03/25/13--see above -Tussionex for cough Anxiety -morphine and versed per PCCM Dehydration  -IV fluids  -FENa < 1%  Chronic abdominal pain  -Continue Bentyl  Abnormal imaging of her right proximal humerus  -patient states she had a fracture at that site many years ago she's currently asymptomatic reviewed this the radiologist who states it's not appear to be an acute fracture  tobacco abuse  -Continues to smoke one half pack per day  -Tobacco cessation discussed  Chronic pain syndrome/fibromyalgia  -pt has narcotic seeking behavior  -I told patient I will not increase fentanyl any further from 12.13mg every 4 hrs  Family Communication: Husband at beside  Disposition Plan: Home when medically stable  Antibiotics:  Vancomycin 03/23/2013>>> 03/24/2013  Zosyn 03/23/2013>>>  Tamiflu 03/23/2013>>> 03/24/2013         Procedures/Studies: Dg Chest Port 1 View  03/22/2013   CLINICAL DATA:  Fever. Cough. Chills. Weakness. Shortness of breath.  EXAM: PORTABLE CHEST - 1 VIEW  COMPARISON:  10/30/2012  FINDINGS: Deformity of the proximal right humerus, incompletely imaged. Midline trachea. No pleural effusion or pneumothorax. Hyperinflation and diffuse interstitial  thickening. Left upper lobe calcified granuloma. No lobar consolidation.  IMPRESSION: COPD/chronic bronchitis. No acute superimposed process.  Deformity of the proximal right humerus. Incompletely imaged. Correlate with Trauma.   Electronically Signed   By: KAbigail MiyamotoM.D.   On: 03/22/2013 18:42         Subjective: Patient decompensates with very little activity. Patient continues to be anxious and very tangential. Denies any vomiting, diarrhea, abdominal pain. Continues to complain of nonproductive cough.  Objective: Filed Vitals:   03/25/13 1300 03/25/13 1336 03/25/13 1400 03/25/13 1518  BP:      Pulse: 79  95 79  Temp:      TempSrc:      Resp: 16  15 12   Height:      Weight:      SpO2: 100% 100% 100% 99%    Intake/Output Summary (Last 24 hours) at 03/25/13 1548 Last data filed at 03/25/13 1400  Gross per 24 hour  Intake 2459.58 ml  Output   1725 ml  Net 734.58 ml   Weight change:  Exam:   General:  Pt is alert, follows commands appropriately  HEENT: No icterus, No thrush,  /AT  Cardiovascular: RRR, S1/S2, no rubs, no gallops  Respiratory: Diminished breath sounds bilateral. Bilateral wheezing. Scattered rales.  Abdomen: Soft/+BS, non tender, non distended, no guarding  Extremities: No edema, No lymphangitis, No petechiae, No rashes, no synovitis  Data Reviewed: Basic Metabolic Panel:  Recent Labs Lab 03/22/13 1911 03/23/13 0005 03/23/13 0505 03/24/13 0406 03/25/13 0333  NA 132* 135* 134* 135* 139  K 4.8 4.6 4.4 4.1 4.5  CL 94* 95* 98 95* 99  CO2  --  31 26 31  32  GLUCOSE 125* 128* 208* 150* 156*  BUN 6 5* 6 5* 9  CREATININE  0.70 0.56 0.51 0.54 0.62  CALCIUM  --  8.2* 8.0* 8.4 8.2*  MG  --   --  1.6  --   --   PHOS  --   --  3.1  --   --    Liver Function Tests:  Recent Labs Lab 03/23/13 0005 03/23/13 0505 03/24/13 0406  AST 32 31 41*  ALT 19 18 26   ALKPHOS 68 62 53  BILITOT <0.2* <0.2* <0.2*  PROT 7.4 6.7 6.7  ALBUMIN 3.5 3.1*  3.0*   No results found for this basename: LIPASE, AMYLASE,  in the last 168 hours No results found for this basename: AMMONIA,  in the last 168 hours CBC:  Recent Labs Lab 03/22/13 1815 03/22/13 1911 03/23/13 0005 03/23/13 0505 03/24/13 0406 03/25/13 0333  WBC 5.2  --  4.4 4.0 5.8 8.0  NEUTROABS 3.4  --   --   --   --   --   HGB 14.6 16.3* 13.1 12.4 11.5* 11.4*  HCT 43.5 48.0* 40.0 37.7 35.3* 35.9*  MCV 89.9  --  90.5 90.4 92.2 94.2  PLT 190  --  170 138* 152 158   Cardiac Enzymes: No results found for this basename: CKTOTAL, CKMB, CKMBINDEX, TROPONINI,  in the last 168 hours BNP: No components found with this basename: POCBNP,  CBG:  Recent Labs Lab 03/23/13 1140  GLUCAP 219*    Recent Results (from the past 240 hour(s))  CULTURE, BLOOD (ROUTINE X 2)     Status: None   Collection Time    03/22/13 11:50 PM      Result Value Range Status   Specimen Description BLOOD LEFT ANTECUBITAL   Final   Special Requests BOTTLES DRAWN AEROBIC AND ANAEROBIC 5CC   Final   Culture  Setup Time     Final   Value: 03/23/2013 03:05     Performed at [facility]   Culture     Final   Value:        BLOOD CULTURE RECEIVED NO GROWTH TO DATE CULTURE WILL BE HELD FOR 5 DAYS BEFORE ISSUING A FINAL NEGATIVE REPORT     Performed at [facility]   Report Status PENDING   Incomplete  CULTURE, BLOOD (ROUTINE X 2)     Status: None   Collection Time    03/23/13 12:05 AM      Result Value Range Status   Specimen Description BLOOD RIGHT FOREARM   Final   Special Requests BOTTLES DRAWN AEROBIC AND ANAEROBIC 5CC   Final   Culture  Setup Time     Final   Value: 03/23/2013 03:06     Performed at [facility]   Culture     Final   Value:        BLOOD CULTURE RECEIVED NO GROWTH TO DATE CULTURE WILL BE HELD FOR 5 DAYS BEFORE ISSUING A FINAL NEGATIVE REPORT     Performed at [facility]   Report Status PENDING   Incomplete  URINE CULTURE     Status: None   Collection  Time    03/23/13  6:31 AM      Result Value Range Status   Specimen Description URINE, CLEAN CATCH   Final   Special Requests Normal   Final   Culture  Setup Time     Final   Value: 03/23/2013 08:57     Performed at [place]     Final  Value: NO GROWTH     Performed at [facility]   Culture     Final   Value: NO GROWTH     Performed at [facility]   Report Status 03/24/2013 FINAL   Final  RESPIRATORY VIRUS PANEL     Status: Abnormal   Collection Time    03/23/13  8:16 AM      Result Value Range Status   Source - RVPAN NASAL SWAB   Corrected   Comment: CORRECTED ON 01/22 AT 2006: PREVIOUSLY REPORTED AS NASAL SWAB   Respiratory Syncytial Virus A NOT DETECTED   Final   Respiratory Syncytial Virus B NOT DETECTED   Final   Influenza A NOT DETECTED   Final   Influenza B NOT DETECTED   Final   Parainfluenza 1 NOT DETECTED   Final   Parainfluenza 2 NOT DETECTED   Final   Parainfluenza 3 NOT DETECTED   Final   Metapneumovirus DETECTED (*)  Final   Rhinovirus NOT DETECTED   Final   Adenovirus NOT DETECTED   Final   Influenza A H1 NOT DETECTED   Final   Influenza A H3 NOT DETECTED   Final   Comment: (NOTE)           Normal Reference Range for each Analyte: NOT DETECTED     Testing performed using the Luminex xTAG Respiratory Viral Panel test     kit.     This test was developed and its performance characteristics determined     by [facility]. It has not been cleared or approved by the [place]     Food and Drug Administration. This test is used for clinical purposes.     It should not be regarded as investigational or for research. This     laboratory is certified under the [place] (CLIA) as qualified to perform high complexity     clinical laboratory testing.     Performed at [place] PCR SCREENING     Status: None   Collection Time    03/23/13  3:54 PM      Result  Value Range Status   MRSA by PCR NEGATIVE  NEGATIVE Final   Comment:            The GeneXpert MRSA Assay (FDA     approved for NASAL specimens     only), is one component of a     comprehensive MRSA colonization     surveillance program. It is not     intended to diagnose MRSA     infection nor to guide or     monitor treatment for     MRSA infections.     Scheduled Meds: . albuterol  2.5 mg Nebulization Q2H  . aspirin EC  81 mg Oral Daily  . budesonide (PULMICORT) nebulizer solution  0.5 mg Nebulization BID  . buPROPion  150 mg Oral BID  . chlorpheniramine-HYDROcodone  5 mL Oral Q12H  . dicyclomine  20 mg Oral TID AC  . docusate sodium  100 mg Oral BID  . enoxaparin (LOVENOX) injection  40 mg Subcutaneous Q24H  . fluticasone  2 spray Each Nare BID  . guaiFENesin  600 mg Oral BID  . ipratropium  0.5 mg Nebulization Q4H  . lactose free nutrition  237 mL Oral BID BM  . loratadine  10 mg Oral QPM  . methylPREDNISolone (SOLU-MEDROL) injection  80 mg Intravenous Q8H  . morphine      . oxymetazoline  1 spray Each Nare BID  . pantoprazole  40 mg Oral BID AC  . piperacillin-tazobactam (ZOSYN)  IV  3.375 g Intravenous Q8H  . senna  2 tablet Oral Daily  . sodium chloride  1 spray Each Nare QID  . sodium chloride  3 mL Intravenous Q12H   Continuous Infusions: . sodium chloride 50 mL/hr at 03/25/13 1023     Lovett Coffin, DO  [facility] Pager [phone]  If 7PM-7AM, please contact night-coverage [email or web address] Password [email or web address] 03/25/2013, 3:48 PM   LOS: 3 days

## 2013-03-25 NOTE — Progress Notes (Signed)
Pt refusing bipap at this time.  Pt remains on 6lnc.  RN aware.

## 2013-03-25 NOTE — Progress Notes (Signed)
Spoke with pt x2 about wearing bipap tonight.  Pt became very upset stating she was not going to wear it anymore tonight.  Pt was explained the benefits and need for bipap due to her increased wob, but she still refused.  RN notified.
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
## 2013-03-25 NOTE — Progress Notes (Signed)
RT placed pt on BIPAP for respiratory distress per MD order.

## 2013-03-26 ENCOUNTER — Inpatient Hospital Stay (HOSPITAL_COMMUNITY): Payer: Medicaid Other

## 2013-03-26 DIAGNOSIS — K5900 Constipation, unspecified: Secondary | ICD-10-CM

## 2013-03-26 LAB — CBC WITH DIFFERENTIAL/PLATELET
Basophils Absolute: 0 10*3/uL (ref 0.0–0.1)
Basophils Relative: 0 % (ref 0–1)
EOS PCT: 0 % (ref 0–5)
Eosinophils Absolute: 0 10*3/uL (ref 0.0–0.7)
HCT: 37 % (ref 36.0–46.0)
HEMOGLOBIN: 11.5 g/dL — AB (ref 12.0–15.0)
LYMPHS ABS: 0.8 10*3/uL (ref 0.7–4.0)
Lymphocytes Relative: 12 % (ref 12–46)
MCH: 29.3 pg (ref 26.0–34.0)
MCHC: 31.1 g/dL (ref 30.0–36.0)
MCV: 94.4 fL (ref 78.0–100.0)
MONO ABS: 0.3 10*3/uL (ref 0.1–1.0)
Monocytes Relative: 5 % (ref 3–12)
NEUTROS ABS: 5.6 10*3/uL (ref 1.7–7.7)
Neutrophils Relative %: 83 % — ABNORMAL HIGH (ref 43–77)
PLATELETS: 180 10*3/uL (ref 150–400)
RBC: 3.92 MIL/uL (ref 3.87–5.11)
RDW: 14.2 % (ref 11.5–15.5)
WBC: 6.7 10*3/uL (ref 4.0–10.5)

## 2013-03-26 LAB — BLOOD GAS, ARTERIAL
Acid-Base Excess: 8.7 mmol/L — ABNORMAL HIGH (ref 0.0–2.0)
BICARBONATE: 37 meq/L — AB (ref 20.0–24.0)
Drawn by: 232811
O2 Content: 6 L/min
O2 Saturation: 98 %
PCO2 ART: 75.2 mmHg — AB (ref 35.0–45.0)
PH ART: 7.313 — AB (ref 7.350–7.450)
Patient temperature: 98.6
TCO2: 34.3 mmol/L (ref 0–100)
pO2, Arterial: 109 mmHg — ABNORMAL HIGH (ref 80.0–100.0)

## 2013-03-26 LAB — BASIC METABOLIC PANEL
BUN: 11 mg/dL (ref 6–23)
CALCIUM: 8.4 mg/dL (ref 8.4–10.5)
CO2: 37 meq/L — AB (ref 19–32)
Chloride: 96 mEq/L (ref 96–112)
Creatinine, Ser: 0.5 mg/dL (ref 0.50–1.10)
GFR calc Af Amer: >90 mL/min (ref >90)
GFR calc non Af Amer: >90 mL/min (ref >90)
GLUCOSE: 140 mg/dL — AB (ref 70–99)
Potassium: 4.2 mEq/L (ref 3.7–5.3)
SODIUM: 140 meq/L (ref 137–147)

## 2013-03-26 LAB — PRO B NATRIURETIC PEPTIDE: Pro B Natriuretic peptide (BNP): 1536 pg/mL — ABNORMAL HIGH (ref 0–125)

## 2013-03-26 LAB — TROPONIN I: Troponin I: <0.3 ng/mL (ref <0.30)

## 2013-03-26 LAB — MAGNESIUM: Magnesium: 2.3 mg/dL (ref 1.5–2.5)

## 2013-03-26 LAB — PROCALCITONIN: Procalcitonin: <0.1 ng/mL

## 2013-03-26 LAB — PHOSPHORUS: Phosphorus: 2.5 mg/dL (ref 2.3–4.6)

## 2013-03-26 MED ORDER — LACTULOSE 10 GM/15ML PO SOLN
10.0000 g | Freq: Once | ORAL | Status: AC
  Administered 2013-03-26: 10 g via ORAL
  Filled 2013-03-26: qty 15

## 2013-03-26 MED ORDER — ALPRAZOLAM 0.25 MG PO TABS
0.2500 mg | ORAL_TABLET | Freq: Once | ORAL | Status: AC
  Administered 2013-03-26: 0.25 mg via ORAL
  Filled 2013-03-26: qty 1

## 2013-03-26 MED ORDER — BISACODYL 10 MG RE SUPP
10.0000 mg | Freq: Once | RECTAL | Status: AC
  Administered 2013-03-26: 10 mg via RECTAL
  Filled 2013-03-26: qty 1

## 2013-03-26 MED ORDER — GUAIFENESIN ER 600 MG PO TB12
600.0000 mg | ORAL_TABLET | Freq: Two times a day (BID) | ORAL | Status: DC
  Administered 2013-03-26: 600 mg via ORAL
  Filled 2013-03-26 (×4): qty 1

## 2013-03-26 MED ORDER — AMOXICILLIN-POT CLAVULANATE 875-125 MG PO TABS
1.0000 | ORAL_TABLET | Freq: Two times a day (BID) | ORAL | Status: DC
  Filled 2013-03-26 (×2): qty 1

## 2013-03-26 NOTE — Progress Notes (Signed)
[facility] PROGRESS NOTE  JACLENE BARTELT OQH:[id number] DOB: [birth year] DOA: 03/22/2013 PCP: Leonard Downing, MD  Assessment/Plan: Acute on chronic respiratory failure  -Likely due to COPD exacerbation and metapneumoviral infection  -Influenza PCR negative  -respiratory virus panel-->+metapneumovirus  -continue supplemental oxygen  -appreciate PCCM  -increase solumedrol  -BiPAP, Brovana, pulmicort per PCCM  -03/26/2013 ABG 7.313//75/109/37 on 6 L -pcxr today -echocardiogram Influenza like illness/Fever/Metapneumovirus infection  -d/c Tamiflu--1/23  -Discontinue Zosyn--1/25 -d/c vanco--1/23 -Start Augmentin 03/26/13 COPD exacerbation  -Continue Solu-Medrol 80 mg IV every 8  -baseline FEV1= 38%  -Aerosolized xopenex and Atrovent  -had decompensation 03/25/13--see above  -Tussionex for cough  Anxiety  -morphine and versed per PCCM  Dehydration  -IV fluids-->improved -FENa < 1%  Chronic abdominal pain  -Continue Bentyl  Abnormal imaging of her right proximal humerus  -patient states she had a fracture at that site many years ago she's currently asymptomatic reviewed this the radiologist who states it's not appear to be an acute fracture  tobacco abuse  -Continues to smoke one half pack per day  -Tobacco cessation discussed  Chronic pain syndrome/fibromyalgia  -pt has narcotic seeking behavior  -I told patient I will not increase fentanyl any further from 12.52mg every 4 hrs  Family Communication: Husband at beside  Disposition Plan: Home when medically stable  Antibiotics:  Vancomycin 03/23/2013>>> 03/24/2013  Zosyn 03/23/2013>>> 03/26/2013  Tamiflu 03/23/2013>>> 03/24/2013 Augmentin 03/26/2013>>>          Procedures/Studies: Dg Chest Port 1 View  03/22/2013   CLINICAL DATA:  Fever. Cough. Chills. Weakness. Shortness of breath.  EXAM: PORTABLE CHEST - 1 VIEW  COMPARISON:  10/30/2012  FINDINGS: Deformity of the proximal right humerus, incompletely  imaged. Midline trachea. No pleural effusion or pneumothorax. Hyperinflation and diffuse interstitial thickening. Left upper lobe calcified granuloma. No lobar consolidation.  IMPRESSION: COPD/chronic bronchitis. No acute superimposed process.  Deformity of the proximal right humerus. Incompletely imaged. Correlate with Trauma.   Electronically Signed   By: KAbigail MiyamotoM.D.   On: 03/22/2013 18:42         Subjective: Patient is breathing better than yesterday. Denies fevers, chills, chest pain, vomiting, diarrhea. Complains of constipation. No abdominal pain. No dysuria. Still has dyspnea on exertion with minimal movement.  Objective: Filed Vitals:   03/26/13 0739 03/26/13 0800 03/26/13 0928 03/26/13 1000  BP:  83/28  142/71  Pulse:  99  85  Temp:      TempSrc:      Resp:  27  27  Height:      Weight:      SpO2: 100% 97% 98% 100%    Intake/Output Summary (Last 24 hours) at 03/26/13 1053 Last data filed at 03/26/13 1000  Gross per 24 hour  Intake 2129.58 ml  Output    400 ml  Net 1729.58 ml   Weight change:  Exam:   General:  Pt is alert, follows commands appropriately, not in acute distress  HEENT: No icterus, No thrush, No neck mass, /AT  Cardiovascular: RRR, S1/S2, no rubs, no gallops  Respiratory: CTA bilaterally, no wheezing, no crackles, no rhonchi  Abdomen: Soft/+BS, non tender, non distended, no guarding  Extremities: No edema, No lymphangitis, No petechiae, No rashes, no synovitis  Data Reviewed: Basic Metabolic Panel:  Recent Labs Lab 03/23/13 0005 03/23/13 0505 03/24/13 0406 03/25/13 0333 03/26/13 0401  NA 135* 134* 135* 139 140  K 4.6 4.4 4.1 4.5 4.2  CL 95* 98 95* 99 96  CO2 31 26  31 32 37*  GLUCOSE 128* 208* 150* 156* 140*  BUN 5* 6 5* 9 11  CREATININE 0.56 0.51 0.54 0.62 0.50  CALCIUM 8.2* 8.0* 8.4 8.2* 8.4  MG  --  1.6  --   --  2.3  PHOS  --  3.1  --   --  2.5   Liver Function Tests:  Recent Labs Lab 03/23/13 0005  03/23/13 0505 03/24/13 0406  AST 32 31 41*  ALT 19 18 26   ALKPHOS 68 62 53  BILITOT <0.2* <0.2* <0.2*  PROT 7.4 6.7 6.7  ALBUMIN 3.5 3.1* 3.0*   No results found for this basename: LIPASE, AMYLASE,  in the last 168 hours No results found for this basename: AMMONIA,  in the last 168 hours CBC:  Recent Labs Lab 03/22/13 1815  03/23/13 0005 03/23/13 0505 03/24/13 0406 03/25/13 0333 03/26/13 0401  WBC 5.2  --  4.4 4.0 5.8 8.0 6.7  NEUTROABS 3.4  --   --   --   --   --  5.6  HGB 14.6  < > 13.1 12.4 11.5* 11.4* 11.5*  HCT 43.5  < > 40.0 37.7 35.3* 35.9* 37.0  MCV 89.9  --  90.5 90.4 92.2 94.2 94.4  PLT 190  --  170 138* 152 158 180  < > = values in this interval not displayed. Cardiac Enzymes:  Recent Labs Lab 03/26/13 0401  TROPONINI <0.30   BNP: No components found with this basename: POCBNP,  CBG:  Recent Labs Lab 03/23/13 1140  GLUCAP 219*    Recent Results (from the past 240 hour(s))  CULTURE, BLOOD (ROUTINE X 2)     Status: None   Collection Time    03/22/13 11:50 PM      Result Value Range Status   Specimen Description BLOOD LEFT ANTECUBITAL   Final   Special Requests BOTTLES DRAWN AEROBIC AND ANAEROBIC 5CC   Final   Culture  Setup Time     Final   Value: 03/23/2013 03:05     Performed at [facility]   Culture     Final   Value:        BLOOD CULTURE RECEIVED NO GROWTH TO DATE CULTURE WILL BE HELD FOR 5 DAYS BEFORE ISSUING A FINAL NEGATIVE REPORT     Performed at [facility]   Report Status PENDING   Incomplete  CULTURE, BLOOD (ROUTINE X 2)     Status: None   Collection Time    03/23/13 12:05 AM      Result Value Range Status   Specimen Description BLOOD RIGHT FOREARM   Final   Special Requests BOTTLES DRAWN AEROBIC AND ANAEROBIC 5CC   Final   Culture  Setup Time     Final   Value: 03/23/2013 03:06     Performed at [facility]   Culture     Final   Value:        BLOOD CULTURE RECEIVED NO GROWTH TO DATE CULTURE WILL BE  HELD FOR 5 DAYS BEFORE ISSUING A FINAL NEGATIVE REPORT     Performed at [facility]   Report Status PENDING   Incomplete  URINE CULTURE     Status: None   Collection Time    03/23/13  6:31 AM      Result Value Range Status   Specimen Description URINE, CLEAN CATCH   Final   Special Requests Normal   Final   Culture  Setup Time  Final   Value: 03/23/2013 08:57     Performed at [place]     Final   Value: NO GROWTH     Performed at [facility]   Culture     Final   Value: NO GROWTH     Performed at [facility]   Report Status 03/24/2013 FINAL   Final  RESPIRATORY VIRUS PANEL     Status: Abnormal   Collection Time    03/23/13  8:16 AM      Result Value Range Status   Source - RVPAN NASAL SWAB   Corrected   Comment: CORRECTED ON 01/22 AT 2006: PREVIOUSLY REPORTED AS NASAL SWAB   Respiratory Syncytial Virus A NOT DETECTED   Final   Respiratory Syncytial Virus B NOT DETECTED   Final   Influenza A NOT DETECTED   Final   Influenza B NOT DETECTED   Final   Parainfluenza 1 NOT DETECTED   Final   Parainfluenza 2 NOT DETECTED   Final   Parainfluenza 3 NOT DETECTED   Final   Metapneumovirus DETECTED (*)  Final   Rhinovirus NOT DETECTED   Final   Adenovirus NOT DETECTED   Final   Influenza A H1 NOT DETECTED   Final   Influenza A H3 NOT DETECTED   Final   Comment: (NOTE)           Normal Reference Range for each Analyte: NOT DETECTED     Testing performed using the Luminex xTAG Respiratory Viral Panel test     kit.     This test was developed and its performance characteristics determined     by [facility]. It has not been cleared or approved by the [place]     Food and Drug Administration. This test is used for clinical purposes.     It should not be regarded as investigational or for research. This     laboratory is certified under the [place] (CLIA) as qualified to perform  high complexity     clinical laboratory testing.     Performed at [place] PCR SCREENING     Status: None   Collection Time    03/23/13  3:54 PM      Result Value Range Status   MRSA by PCR NEGATIVE  NEGATIVE Final   Comment:            The GeneXpert MRSA Assay (FDA     approved for NASAL specimens     only), is one component of a     comprehensive MRSA colonization     surveillance program. It is not     intended to diagnose MRSA     infection nor to guide or     monitor treatment for     MRSA infections.     Scheduled Meds: . albuterol  2.5 mg Nebulization Q2H  . aspirin EC  81 mg Oral Daily  . budesonide (PULMICORT) nebulizer solution  0.5 mg Nebulization BID  . buPROPion  150 mg Oral BID  . chlorpheniramine-HYDROcodone  5 mL Oral Q12H  . dicyclomine  20 mg Oral TID AC  . docusate sodium  100 mg Oral BID  . enoxaparin (LOVENOX) injection  40 mg Subcutaneous Q24H  . fluticasone  2 spray Each Nare BID  . guaiFENesin  600 mg Oral BID  . ipratropium  0.5 mg Nebulization  Q4H  . lactose free nutrition  237 mL Oral BID BM  . loratadine  10 mg Oral QPM  . methylPREDNISolone (SOLU-MEDROL) injection  80 mg Intravenous Q8H  . oxymetazoline  1 spray Each Nare BID  . pantoprazole  40 mg Oral BID AC  . piperacillin-tazobactam (ZOSYN)  IV  3.375 g Intravenous Q8H  . senna  2 tablet Oral Daily  . sodium chloride  1 spray Each Nare QID  . sodium chloride  3 mL Intravenous Q12H   Continuous Infusions: . sodium chloride 50 mL/hr at 03/25/13 1023     Kazden Largo, DO  [facility] Pager [phone]  If 7PM-7AM, please contact night-coverage [email or web address] Password [facility] 03/26/2013, 10:53 AM   LOS: 4 days
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
## 2013-03-26 NOTE — Progress Notes (Signed)
This shift, patient's husband assisted patient OOB to bathroom without notifying staff.  Patient was taken off the monitor and nasal cannula to walk to the bathroom.  This RN walked in on patient getting back to bed with husband's assistance.  Increased WOB with accessory muscle use.  O2 saturations in low 70s.  Attempted to reapply patient's nasal cannula, however, patient's husband unhooked the nasal cannula to hook up a venturi mask and insisted patient wear it instead of nasal cannula.  Patient refused nasal cannula and stated she "needed O2".  Patient's husband attempting to hook patient back up to cardiac monitor, and kindly asked husband not to do anything with the monitors, and call staff for assistance.  Explained the extreme importance of having nursing staff assist with OOB, O2, and monitors.  Patient's husband left room, despite patient's requests for him to stay.  Patient stated husband was "upset".  Explained again to patient that it is not safe to get OOB due to work of breaking and O2 requirements.  Patient verbalized understanding.  Late in shift, went in room to titrate nasal cannula to 3L and O2 was on 2L.  Patient stated her husband "changed it earlier because it was too much oxygen".  Explained to patient that no one except nursing staff or respiratory staff should be titrating O2.  Patient's husband not present at this time.  Will continue to monitor.

## 2013-03-26 NOTE — Progress Notes (Signed)
*  PRELIMINARY RESULTS* Echocardiogram 2D Echocardiogram  Patient stated echo was just done two months ago and she did not see why she needed another one. Upon investigation echo was in fact done on 11/13/12. Please advise the patient and reorder if echo is still needed.   Arvil Chaco 03/26/2013, 3:48 PM

## 2013-03-26 NOTE — Progress Notes (Signed)
ABG drawn.  Results called to [facility] and given to Dr Deterding.  Pt continues to refuse bipap at this time.  MD notified.

## 2013-03-26 NOTE — Progress Notes (Signed)
Patient's family members in room with patient without wearing isolation gear (mask/gown).  Family members refused to wear isolation mask and gown.  Educated family members and patient on importance of wearing isolation gear to prevent spread of virus to visitors/staff/other patients.  Family members refused.  Will continue to monitor.

## 2013-03-26 NOTE — Progress Notes (Signed)
Patient having anxiety, increased WOB, and decreased O2 sats to mid 70s.  Offered Morphine for WOB relief, patient refused, stating "It makes me too jittery" and "I want something for anxiety".  1mg  Versed IV given per PRN order.  Will continue to monitor.

## 2013-03-27 DIAGNOSIS — F4310 Post-traumatic stress disorder, unspecified: Secondary | ICD-10-CM

## 2013-03-27 DIAGNOSIS — F319 Bipolar disorder, unspecified: Secondary | ICD-10-CM

## 2013-03-27 LAB — CBC WITH DIFFERENTIAL/PLATELET
BASOS ABS: 0 10*3/uL (ref 0.0–0.1)
Basophils Relative: 0 % (ref 0–1)
EOS ABS: 0 10*3/uL (ref 0.0–0.7)
Eosinophils Relative: 0 % (ref 0–5)
HCT: 36.3 % (ref 36.0–46.0)
Hemoglobin: 11.3 g/dL — ABNORMAL LOW (ref 12.0–15.0)
LYMPHS ABS: 0.7 10*3/uL (ref 0.7–4.0)
LYMPHS PCT: 10 % — AB (ref 12–46)
MCH: 29.2 pg (ref 26.0–34.0)
MCHC: 31.1 g/dL (ref 30.0–36.0)
MCV: 93.8 fL (ref 78.0–100.0)
Monocytes Absolute: 0.3 10*3/uL (ref 0.1–1.0)
Monocytes Relative: 5 % (ref 3–12)
Neutro Abs: 5.4 10*3/uL (ref 1.7–7.7)
Neutrophils Relative %: 85 % — ABNORMAL HIGH (ref 43–77)
PLATELETS: 188 10*3/uL (ref 150–400)
RBC: 3.87 MIL/uL (ref 3.87–5.11)
RDW: 14 % (ref 11.5–15.5)
WBC: 6.4 10*3/uL (ref 4.0–10.5)

## 2013-03-27 LAB — BLOOD GAS, VENOUS
Acid-Base Excess: 9.2 mmol/L — ABNORMAL HIGH (ref 0.0–2.0)
BICARBONATE: 35.7 meq/L — AB (ref 20.0–24.0)
Delivery systems: POSITIVE
FIO2: 0.3 %
MODE: POSITIVE
O2 SAT: 74.5 %
PATIENT TEMPERATURE: 98.6
TCO2: 32.8 mmol/L (ref 0–100)
pCO2, Ven: 60.6 mmHg — ABNORMAL HIGH (ref 45.0–50.0)
pH, Ven: 7.388 — ABNORMAL HIGH (ref 7.250–7.300)
pO2, Ven: 38.1 mmHg (ref 30.0–45.0)

## 2013-03-27 LAB — BASIC METABOLIC PANEL
BUN: 10 mg/dL (ref 6–23)
CO2: 40 mEq/L (ref 19–32)
Calcium: 8.5 mg/dL (ref 8.4–10.5)
Chloride: 94 mEq/L — ABNORMAL LOW (ref 96–112)
Creatinine, Ser: 0.43 mg/dL — ABNORMAL LOW (ref 0.50–1.10)
GFR calc Af Amer: >90 mL/min (ref >90)
Glucose, Bld: 144 mg/dL — ABNORMAL HIGH (ref 70–99)
Potassium: 3.8 mEq/L (ref 3.7–5.3)
SODIUM: 139 meq/L (ref 137–147)

## 2013-03-27 LAB — PHOSPHORUS: PHOSPHORUS: 2 mg/dL — AB (ref 2.3–4.6)

## 2013-03-27 LAB — MAGNESIUM: Magnesium: 2.2 mg/dL (ref 1.5–2.5)

## 2013-03-27 MED ORDER — FUROSEMIDE 10 MG/ML IJ SOLN
20.0000 mg | Freq: Once | INTRAMUSCULAR | Status: AC
  Administered 2013-03-27: 20 mg via INTRAVENOUS
  Filled 2013-03-27: qty 2

## 2013-03-27 MED ORDER — FENTANYL CITRATE 0.05 MG/ML IJ SOLN
25.0000 ug | INTRAMUSCULAR | Status: DC | PRN
  Administered 2013-03-27 (×2): 50 ug via INTRAVENOUS
  Administered 2013-03-27 – 2013-03-29 (×7): 100 ug via INTRAVENOUS
  Administered 2013-03-29 (×3): 50 ug via INTRAVENOUS
  Administered 2013-03-29 – 2013-03-30 (×4): 100 ug via INTRAVENOUS
  Administered 2013-03-30: 50 ug via INTRAVENOUS
  Administered 2013-03-30 – 2013-03-31 (×4): 100 ug via INTRAVENOUS
  Administered 2013-03-31: 50 ug via INTRAVENOUS
  Administered 2013-03-31 – 2013-04-01 (×2): 100 ug via INTRAVENOUS
  Administered 2013-04-01: 50 ug via INTRAVENOUS
  Administered 2013-04-01: 100 ug via INTRAVENOUS
  Administered 2013-04-01: 50 ug via INTRAVENOUS
  Administered 2013-04-02: 100 ug via INTRAVENOUS
  Administered 2013-04-02: 50 ug via INTRAVENOUS
  Filled 2013-03-27 (×29): qty 2

## 2013-03-27 MED ORDER — DIAZEPAM 5 MG PO TABS
10.0000 mg | ORAL_TABLET | Freq: Two times a day (BID) | ORAL | Status: DC
  Administered 2013-03-27 – 2013-04-03 (×15): 10 mg via ORAL
  Filled 2013-03-27 (×16): qty 2

## 2013-03-27 MED ORDER — ALBUTEROL SULFATE (2.5 MG/3ML) 0.083% IN NEBU: 2.5000 mg | INHALATION_SOLUTION | RESPIRATORY_TRACT | Status: DC | PRN

## 2013-03-27 MED ORDER — IPRATROPIUM-ALBUTEROL 0.5-2.5 (3) MG/3ML IN SOLN
3.0000 mL | RESPIRATORY_TRACT | Status: DC
  Administered 2013-03-27 – 2013-03-31 (×24): 3 mL via RESPIRATORY_TRACT
  Filled 2013-03-27 (×24): qty 3

## 2013-03-27 MED ORDER — POTASSIUM PHOSPHATE DIBASIC 3 MMOLE/ML IV SOLN
10.0000 mmol | Freq: Once | INTRAVENOUS | Status: AC
  Administered 2013-03-27: 10 mmol via INTRAVENOUS
  Filled 2013-03-27: qty 3.33

## 2013-03-27 MED ORDER — PREDNISONE 20 MG PO TABS
40.0000 mg | ORAL_TABLET | Freq: Every day | ORAL | Status: DC
  Administered 2013-03-28: 40 mg via ORAL
  Filled 2013-03-27 (×3): qty 2

## 2013-03-27 MED ORDER — ALBUTEROL SULFATE (2.5 MG/3ML) 0.083% IN NEBU: 2.5000 mg | INHALATION_SOLUTION | RESPIRATORY_TRACT | Status: DC

## 2013-03-27 NOTE — Progress Notes (Signed)
At 2351, pt seen for Q2 scheduled breathing tx and found on 4lnc.  Per RN, pt taken off bipap around 2300 due to pt request.  Pt refuses to put bipap back on at this time.
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
## 2013-03-27 NOTE — Progress Notes (Signed)
[facility] PROGRESS NOTE  [name] VZS:[id number] DOB: 1961/01/08 DOA: 03/22/2013 PCP: Leonard Downing, MD  Assessment/Plan: Acute on chronic respiratory failure  -Likely due to COPD exacerbation and metapneumoviral infection  -Influenza PCR negative  -respiratory virus panel-->+metapneumovirus  -continue supplemental oxygen  -appreciate PCCM  -increased solumedrol 03/25/13 per PCCM -BiPAP, Brovana, pulmicort per PCCM-->Duoneb q 4hr -03/26/2013 ABG 7.313//75/109/37 on 6 L  -pcxr 03/26/13--chronic interstitial changes with left basilar subsegmental atelectasis -echocardiogram--results pending Influenza like illness/Fever/Metapneumovirus infection  -d/c Tamiflu--1/23  -Discontinue Zosyn--1/25  -d/c vanco--1/23  COPD exacerbation  -Solu-Medrol 80 mg IV every 8--->prednisone 68m daily (03/27/13)  -baseline FEV1= 38%  -Aerosolized albuterol and Atrovent  -had decompensation 03/25/13--placed on biPAP-->weaned off 03/26/13 -now remains stable on [place] but desaturates with minimal activity -Tussionex for cough  Anxiety  -morphine and versed per PCCM  Dehydration  -whenpatient was initially admitted, the patient was clinically hypovolemic and started on intravenous fluids -IV fluids-->improved  -FENa < 1%  -Fluids with saline locked. ProBNP was performed and was 1536 Chronic abdominal pain  -Continue Bentyl  Abnormal imaging of her right proximal humerus  -patient states she had a fracture at that site many years ago she's currently asymptomatic reviewed this the radiologist who states it's not appear to be an acute fracture  tobacco abuse  -Continues to smoke one half pack per day  -Tobacco cessation discussed  Chronic pain syndrome/fibromyalgiaAnxiety  -pt has narcotic seeking behavior  -I told patient I will not increase fentanyl any further from 12.525m every 4 hrs  -Patient was on unhappy with nursing care -Patient is upset and refuses morphine and Versed although  these were used for her respiratory distress -Patient upset that Valium 10 mg every 12 hours was not scheduled -Patient alleges bilateral thigh ecchymoses were caused by the pain use -agreed to psychiatry eval   Family Communication: Husband at beside  Disposition Plan: Transfer to PCCM service--discussed with Dr. RaChase CallerAntibiotics:  Vancomycin 03/23/2013>>> 03/24/2013  Zosyn 03/23/2013>>> 03/26/2013  Tamiflu 03/23/2013>>> 03/24/2013  Augmentin 03/26/2013       Procedures/Studies: Dg Chest Port 1 View  03/26/2013   CLINICAL DATA:  Shortness of breath and wheezing.  Fever.  EXAM: PORTABLE CHEST - 1 VIEW  COMPARISON:  03/22/2013.  07/13/2012.  FINDINGS: Mediastinum and hilar structures are normal. Stable mild interstitial prominence suggesting interstitial fibrosis. Mild basilar atelectasis on the left. No focal alveolar infiltrate. COPD. Heart size normal. No pleural effusion or pneumothorax. No acute bony abnormality.  IMPRESSION: 1. Mild left base subsegmental atelectasis. 2. Interstitial fibrosis and COPD.   Electronically Signed   By: ThDe Soto On: 03/26/2013 17:02   Dg Chest Port 1 View  03/22/2013   CLINICAL DATA:  Fever. Cough. Chills. Weakness. Shortness of breath.  EXAM: PORTABLE CHEST - 1 VIEW  COMPARISON:  10/30/2012  FINDINGS: Deformity of the proximal right humerus, incompletely imaged. Midline trachea. No pleural effusion or pneumothorax. Hyperinflation and diffuse interstitial thickening. Left upper lobe calcified granuloma. No lobar consolidation.  IMPRESSION: COPD/chronic bronchitis. No acute superimposed process.  Deformity of the proximal right humerus. Incompletely imaged. Correlate with Trauma.   Electronically Signed   By: KyAbigail Miyamoto.D.   On: 03/22/2013 18:42         Subjective: Patient is breathing better today. She expressed displeasure regarding current nursing care. She denies any fevers, chills, chest pain, vomiting, diarrhea. She finally  had a bowel movement. No abdominal pain or dysuria.  Objective: Filed Vitals:  03/27/13 0342 03/27/13 0400 03/27/13 0531 03/27/13 0800  BP:  150/82  139/75  Pulse:  85  74  Temp:  98.1 F (36.7 C)    TempSrc:  Axillary    Resp:  19  19  Height:      Weight:  66.4 kg (146 lb 6.2 oz)    SpO2: 100% 98% 100% 100%    Intake/Output Summary (Last 24 hours) at 03/27/13 1059 Last data filed at 03/27/13 0800  Gross per 24 hour  Intake   1290 ml  Output   1350 ml  Net    -60 ml   Weight change: 2.1 kg (4 lb 10.1 oz) Exam:   General:  Pt is alert, follows commands appropriately, not in acute distress  HEENT: No icterus, No thrush,/AT  Cardiovascular: RRR, S1/S2, no rubs, no gallops  Respiratory: Bibasilar rales. No wheezing. No rhonchi.  Abdomen: Soft/+BS, non tender, non distended, no guarding  Extremities: No edema, No lymphangitis, No petechiae, No rashes, no synovitis  Data Reviewed: Basic Metabolic Panel:  Recent Labs Lab 03/23/13 0505 03/24/13 0406 03/25/13 0333 03/26/13 0401 03/27/13 0358  NA 134* 135* 139 140 139  K 4.4 4.1 4.5 4.2 3.8  CL 98 95* 99 96 94*  CO2 26 31 32 37* 40*  GLUCOSE 208* 150* 156* 140* 144*  BUN 6 5* _0 CREATININE 0.51 0.54 0.62 0.50 0.43*  CALCIUM 8.0* 8.4 8.2* 8.4 8.5  MG 1.6  --   --  2.3 2.2  PHOS 3.1  --   --  2.5 2.0*   Liver Function Tests:  Recent Labs Lab 03/23/13 0005 03/23/13 0505 03/24/13 0406  AST 32 31 41*  ALT _1 ALKPHOS 68 62 53  BILITOT <0.2* <0.2* <0.2*  PROT 7.4 6.7 6.7  ALBUMIN 3.5 3.1* 3.0*   No results found for this basename: LIPASE, AMYLASE,  in the last 168 hours No results found for this basename: AMMONIA,  in the last 168 hours CBC:  Recent Labs Lab 03/22/13 1815  03/23/13 0505 03/24/13 0406 03/25/13 0333 03/26/13 0401 03/27/13 0358  WBC 5.2  < > 4.0 5.8 8.0 6.7 6.4  NEUTROABS 3.4  --   --   --   --  5.6 5.4  HGB 14.6  < > 12.4 11.5* 11.4* 11.5* 11.3*  HCT 43.5  < >  37.7 35.3* 35.9* 37.0 36.3  MCV 89.9  < > 90.4 92.2 94.2 94.4 93.8  PLT 190  < > 138* 152 158 180 188  < > = values in this interval not displayed. Cardiac Enzymes:  Recent Labs Lab 03/26/13 0401  TROPONINI <0.30   BNP: No components found with this basename: POCBNP,  CBG:  Recent Labs Lab 03/23/13 1140  GLUCAP 219*    Recent Results (from the past 240 hour(s))  CULTURE, BLOOD (ROUTINE X 2)     Status: None   Collection Time    03/22/13 11:50 PM      Result Value Range Status   Specimen Description BLOOD LEFT ANTECUBITAL   Final   Special Requests BOTTLES DRAWN AEROBIC AND ANAEROBIC 5CC   Final   Culture  Setup Time     Final   Value: 03/23/2013 03:05     Performed at [facility]   Culture     Final   Value:        BLOOD CULTURE RECEIVED NO GROWTH TO DATE CULTURE WILL BE HELD FOR 5 DAYS BEFORE  ISSUING A FINAL NEGATIVE REPORT     Performed at [facility]   Report Status PENDING   Incomplete  CULTURE, BLOOD (ROUTINE X 2)     Status: None   Collection Time    03/23/13 12:05 AM      Result Value Range Status   Specimen Description BLOOD RIGHT FOREARM   Final   Special Requests BOTTLES DRAWN AEROBIC AND ANAEROBIC 5CC   Final   Culture  Setup Time     Final   Value: 03/23/2013 03:06     Performed at [facility]   Culture     Final   Value:        BLOOD CULTURE RECEIVED NO GROWTH TO DATE CULTURE WILL BE HELD FOR 5 DAYS BEFORE ISSUING A FINAL NEGATIVE REPORT     Performed at [facility]   Report Status PENDING   Incomplete  URINE CULTURE     Status: None   Collection Time    03/23/13  6:31 AM      Result Value Range Status   Specimen Description URINE, CLEAN CATCH   Final   Special Requests Normal   Final   Culture  Setup Time     Final   Value: 03/23/2013 08:57     Performed at [place]     Final   Value: NO GROWTH     Performed at [facility]   Culture     Final   Value: NO GROWTH      Performed at [facility]   Report Status 03/24/2013 FINAL   Final  RESPIRATORY VIRUS PANEL     Status: Abnormal   Collection Time    03/23/13  8:16 AM      Result Value Range Status   Source - [facility]AN NASAL SWAB   Corrected   Comment: CORRECTED ON 01/22 AT 2006: PREVIOUSLY REPORTED AS NASAL SWAB   Respiratory Syncytial Virus A NOT DETECTED   Final   Respiratory Syncytial Virus B NOT DETECTED   Final   Influenza A NOT DETECTED   Final   Influenza B NOT DETECTED   Final   Parainfluenza 1 NOT DETECTED   Final   Parainfluenza 2 NOT DETECTED   Final   Parainfluenza 3 NOT DETECTED   Final   Metapneumovirus DETECTED (*)  Final   Rhinovirus NOT DETECTED   Final   Adenovirus NOT DETECTED   Final   Influenza A H1 NOT DETECTED   Final   Influenza A H3 NOT DETECTED   Final   Comment: (NOTE)           Normal Reference Range for each Analyte: NOT DETECTED     Testing performed using the Luminex xTAG Respiratory Viral Panel test     kit.     This test was developed and its performance characteristics determined     by [facility]. It has not been cleared or approved by the [place]     Food and Drug Administration. This test is used for clinical purposes.     It should not be regarded as investigational or for research. This     laboratory is certified under the [place] (CLIA) as qualified to perform high complexity     clinical laboratory testing.     Performed at [place] [facility] PCR SCREENING     Status: None  Collection Time    03/23/13  3:54 PM      Result Value Range Status   MRSA by PCR NEGATIVE  NEGATIVE Final   Comment:            The GeneXpert MRSA Assay (FDA     approved for NASAL specimens     only), is one component of a     comprehensive MRSA colonization     surveillance program. It is not     intended to diagnose MRSA     infection nor to guide or     monitor treatment for     MRSA infections.      Scheduled Meds: . aspirin EC  81 mg Oral Daily  . budesonide (PULMICORT) nebulizer solution  0.5 mg Nebulization BID  . diazepam  10 mg Oral Q12H  . dicyclomine  20 mg Oral TID AC  . docusate sodium  100 mg Oral BID  . enoxaparin (LOVENOX) injection  40 mg Subcutaneous Q24H  . fluticasone  2 spray Each Nare BID  . furosemide  20 mg Intravenous Once  . guaiFENesin  600 mg Oral BID  . ipratropium-albuterol  3 mL Nebulization Q4H  . lactose free nutrition  237 mL Oral BID BM  . pantoprazole  40 mg Oral BID AC  . potassium phosphate IVPB (mmol)  10 mmol Intravenous Once  . [START ON 03/28/2013] predniSONE  40 mg Oral Q breakfast  . senna  2 tablet Oral Daily  . sodium chloride  1 spray Each Nare QID  . sodium chloride  3 mL Intravenous Q12H   Continuous Infusions:    Yi Falletta, DO  [facility] Pager [phone]  If 7PM-7AM, please contact night-coverage [email or web address] Password [email or web address] 03/27/2013, 10:59 AM   LOS: 5 days

## 2013-03-27 NOTE — Progress Notes (Signed)
CARE MANAGEMENT NOTE 03/27/2013  Patient:  [name], [name]   Account Number:  [id number]  Date Initiated:  03/27/2013  Documentation initiated by:  Reinitz,RHONDA  Subjective/Objective Assessment:   pat admitted on 012215/ed cm inital review/pt is at time not willing to cooperate with treatment, o2 sats down into the 70's bipap at night and prn ,     Action/Plan:   husband is a concern to all staff/he has been noted changing o2 setting , and getting patient up in the room without letting the staff know/family refuses to wear the required gown and mask.   Anticipated DC Date:  03/30/2013   Anticipated DC Plan:  HOME W HOME HEALTH SERVICES  In-house referral  NA      DC Planning Services  NA      [facility] Choice  NA   Choice offered to / List presented to:  NA   DME arranged  NA      DME agency  NA     HH arranged  NA      HH agency  NA   Status of service:  In process, will continue to follow Medicare Important Message given?  NA - LOS <3 / Initial given by admissions (If response is "NO", the following Medicare IM given date fields will be blank) Date Medicare IM given:   Date Additional Medicare IM given:    Discharge Disposition:    Per UR Regulation:  Reviewed for med. necessity/level of care/duration of stay  If discussed at Long Length of Stay Meetings, dates discussed:    Comments:  [id number]/Rhonda Kenneshia, [place], BSN, [place] [phone] Chart Reviewed for discharge and hospital needs. Discharge needs at time of review:  None present will follow for needs. Review of patient progress due on [id number].

## 2013-03-27 NOTE — Progress Notes (Signed)
Spoke to pt about wearing Bi-PAP for tonight but pt refused to wear it. The benefits of wearing Bi-PAP were explained to the pt however she continued to refuse it. Pt is currently on a 6lpm nasal cannula SpO2 100%. RN notified. RT will continue to monitor as needed.

## 2013-03-27 NOTE — Consult Note (Signed)
[facility] Face-to-Face Psychiatry Consult   Reason for Consult:  Anxiety Referring Physician:  Dr Wellington Hampshire [name] is an 53 y.o. female.  Assessment: AXIS I:  Anxiety Disorder NOS AXIS II:  Deferred AXIS III:   Past Medical History  Diagnosis Date  . TIA (transient ischemic attack)   . GERD (gastroesophageal reflux disease)   . Hepatomegaly     hx  . Colonic inertia   . Lung abscess     "calcified over"; bilateral   . COPD (chronic obstructive pulmonary disease)   . Stroke 2009    TIA  . PTSD (post-traumatic stress disorder)   . Bipolar disorder   . Depression   . Chronic headache   . Seizures     in past  . Fibromyalgia   . Iron deficiency anemia   . Arthritis   . Acute ischemic colitis 11/01/2012  . Marijuana abuse 2014    positive screen in hospital  . TIA (transient ischemic attack)    AXIS IV:  other psychosocial or environmental problems, problems related to social environment and problems with access to health care services AXIS V:  51-60 moderate symptoms  Plan:  No evidence of imminent risk to self or others at present.   Patient does not meet criteria for psychiatric inpatient admission. Supportive therapy provided about ongoing stressors.  Subjective:   [name] is a 53 y.o. female patient admitted with acute respiratory failure.   HPI:   patient seen chart reviewed.  Consult was called because patient requested to be seen by psychiatrist .  Apparently she was not getting enough benzodiazepine and very upset.  However when I came to see her she refused to see this Probation officer and reported that she is very tired and wanted to get some sleep.  She reports it finally getting some Valium and sleep and at this time she is not interested to see any psychiatrist.  She admitted history of anxiety and getting benzodiazepines from her primary care physician.  She denies any suicidal thoughts or homicidal thoughts.  She appears very anxious and she has difficulty catching  her breath.  She wanted to get some rest at this time.  Patient denies any previous history of suicidal attempt or any inpatient psychiatric treatment  HPI Elements:   Location:  Anxiety. Quality:  poor. Severity:  Mild to moderate.  Past Psychiatric History: Past Medical History  Diagnosis Date  . TIA (transient ischemic attack)   . GERD (gastroesophageal reflux disease)   . Hepatomegaly     hx  . Colonic inertia   . Lung abscess     "calcified over"; bilateral   . COPD (chronic obstructive pulmonary disease)   . Stroke 2009    TIA  . PTSD (post-traumatic stress disorder)   . Bipolar disorder   . Depression   . Chronic headache   . Seizures     in past  . Fibromyalgia   . Iron deficiency anemia   . Arthritis   . Acute ischemic colitis 11/01/2012  . Marijuana abuse 2014    positive screen in hospital  . TIA (transient ischemic attack)     reports that she has been smoking Cigarettes.  She has a 39 pack-year smoking history. She has never used smokeless tobacco. She reports that she does not drink alcohol or use illicit drugs. Family History  Problem Relation Age of Onset  . Emphysema Father   . Asthma Son   . Clotting disorder Mother   .  Rheum arthritis Sister   . Rheum arthritis Brother   . Ovarian cancer Sister      Living Arrangements: Spouse/significant other   Abuse/Neglect [facility]) Physical Abuse: Denies Verbal Abuse: Denies Sexual Abuse: Denies Allergies:   Allergies  Allergen Reactions  . Oxycodone-Acetaminophen Anaphylaxis, Itching and Nausea And Vomiting    Tylox (Pt states, "I can only take Demerol, Oxycontin, and Dilaudid")  . Codeine Nausea And Vomiting  . Hydrocodone Nausea And Vomiting  . Quinolones Hives    ACT Assessment Complete:  Yes:    Educational Status    Risk to Self: Risk to self Is patient at risk for suicide?: No Substance abuse history and/or treatment for substance abuse?: No  Risk to Others:    Abuse: Abuse/Neglect Assessment  (Assessment to be complete while patient is alone) Physical Abuse: Denies Verbal Abuse: Denies Sexual Abuse: Denies Exploitation of patient/patient's resources: Denies Self-Neglect: Denies  Prior Inpatient Therapy:    Prior Outpatient Therapy:    Additional Information:                    Objective: Blood pressure 142/68, pulse 95, temperature 97.1 F (36.2 C), temperature source Axillary, resp. rate 21, height 5' 7.5" (1.715 m), weight 146 lb 6.2 oz (66.4 kg), SpO2 96.00%.Body mass index is 22.58 kg/(m^2). Results for orders placed during the hospital encounter of 03/22/13 (from the past 72 hour(s))  BASIC METABOLIC PANEL     Status: Abnormal   Collection Time    03/25/13  3:33 AM      Result Value Range   Sodium 139  137 - 147 mEq/L   Potassium 4.5  3.7 - 5.3 mEq/L   Chloride 99  96 - 112 mEq/L   CO2 32  19 - 32 mEq/L   Glucose, Bld 156 (*) 70 - 99 mg/dL   BUN 9  6 - 23 mg/dL   Creatinine, Ser 0.62  0.50 - 1.10 mg/dL   Calcium 8.2 (*) 8.4 - 10.5 mg/dL   GFR calc non Af Amer >90  >90 mL/min   GFR calc Af Amer >90  >90 mL/min   Comment: (NOTE)     The eGFR has been calculated using the CKD EPI equation.     This calculation has not been validated in all clinical situations.     eGFR's persistently <90 mL/min signify possible Chronic Kidney     Disease.  CBC     Status: Abnormal   Collection Time    03/25/13  3:33 AM      Result Value Range   WBC 8.0  4.0 - 10.5 K/uL   RBC 3.81 (*) 3.87 - 5.11 MIL/uL   Hemoglobin 11.4 (*) 12.0 - 15.0 g/dL   HCT 35.9 (*) 36.0 - 46.0 %   MCV 94.2  78.0 - 100.0 fL   MCH 29.9  26.0 - 34.0 pg   MCHC 31.8  30.0 - 36.0 g/dL   RDW 14.2  11.5 - 15.5 %   Platelets 158  150 - 400 K/uL  BLOOD GAS, VENOUS     Status: Abnormal   Collection Time    03/25/13 10:43 AM      Result Value Range   FIO2 0.30     Delivery systems BILEVEL POSITIVE AIRWAY PRESSURE     Mode BILEVEL POSITIVE AIRWAY PRESSURE     pH, Ven 7.388 (*) 7.250 - 7.300    pCO2, Ven 60.6 (*) 45.0 - 50.0 mmHg   pO2, Ven 38.1  30.0 - 45.0 mmHg   Bicarbonate 35.7 (*) 20.0 - 24.0 mEq/L   TCO2 32.8  0 - 100 mmol/L   Acid-Base Excess 9.2 (*) 0.0 - 2.0 mmol/L   O2 Saturation 74.5     Patient temperature 98.6     Collection site VEIN     Comment: CORRECTED ON 01/26 AT 5697: PREVIOUSLY REPORTED AS RIGHT RADIAL   Drawn by COLLECTED BY LABORATORY     Comment: CORRECTED ON 01/26 AT 9480: PREVIOUSLY REPORTED AS COLLECTED BY RT   Sample type VENOUS    PROCALCITONIN     Status: None   Collection Time    03/26/13  4:01 AM      Result Value Range   Procalcitonin <0.10     Comment:            Interpretation:     PCT (Procalcitonin) <= 0.5 ng/mL:     Systemic infection (sepsis) is not likely.     Local bacterial infection is possible.     (NOTE)             ICU PCT Algorithm               Non ICU PCT Algorithm        ----------------------------     ------------------------------             PCT < 0.25 ng/mL                 PCT < 0.1 ng/mL         Stopping of antibiotics            Stopping of antibiotics           strongly encouraged.               strongly encouraged.        ----------------------------     ------------------------------           PCT level decrease by               PCT < 0.25 ng/mL           >= 80% from peak PCT           OR PCT 0.25 - 0.5 ng/mL          Stopping of antibiotics                                                 encouraged.         Stopping of antibiotics               encouraged.        ----------------------------     ------------------------------           PCT level decrease by              PCT >= 0.25 ng/mL           < 80% from peak PCT            AND PCT >= 0.5 ng/mL            Continuing antibiotics  encouraged.           Continuing antibiotics                encouraged.        ----------------------------     ------------------------------         PCT level increase  compared          PCT > 0.5 ng/mL             with peak PCT AND              PCT >= 0.5 ng/mL             Escalation of antibiotics                                              strongly encouraged.          Escalation of antibiotics            strongly encouraged.  MAGNESIUM     Status: None   Collection Time    03/26/13  4:01 AM      Result Value Range   Magnesium 2.3  1.5 - 2.5 mg/dL  PHOSPHORUS     Status: None   Collection Time    03/26/13  4:01 AM      Result Value Range   Phosphorus 2.5  2.3 - 4.6 mg/dL  BASIC METABOLIC PANEL     Status: Abnormal   Collection Time    03/26/13  4:01 AM      Result Value Range   Sodium 140  137 - 147 mEq/L   Potassium 4.2  3.7 - 5.3 mEq/L   Chloride 96  96 - 112 mEq/L   CO2 37 (*) 19 - 32 mEq/L   Glucose, Bld 140 (*) 70 - 99 mg/dL   BUN 11  6 - 23 mg/dL   Creatinine, Ser 0.50  0.50 - 1.10 mg/dL   Calcium 8.4  8.4 - 10.5 mg/dL   GFR calc non Af Amer >90  >90 mL/min   GFR calc Af Amer >90  >90 mL/min   Comment: (NOTE)     The eGFR has been calculated using the CKD EPI equation.     This calculation has not been validated in all clinical situations.     eGFR's persistently <90 mL/min signify possible Chronic Kidney     Disease.  CBC WITH DIFFERENTIAL     Status: Abnormal   Collection Time    03/26/13  4:01 AM      Result Value Range   WBC 6.7  4.0 - 10.5 K/uL   RBC 3.92  3.87 - 5.11 MIL/uL   Hemoglobin 11.5 (*) 12.0 - 15.0 g/dL   HCT 37.0  36.0 - 46.0 %   MCV 94.4  78.0 - 100.0 fL   MCH 29.3  26.0 - 34.0 pg   MCHC 31.1  30.0 - 36.0 g/dL   RDW 14.2  11.5 - 15.5 %   Platelets 180  150 - 400 K/uL   Neutrophils Relative % 83 (*) 43 - 77 %   Lymphocytes Relative 12  12 - 46 %   Monocytes Relative 5  3 - 12 %   Eosinophils Relative 0  0 - 5 %   Basophils Relative 0  0 - 1 %   Neutro Abs 5.6  1.7 - 7.7 K/uL   Lymphs Abs 0.8  0.7 - 4.0 K/uL   Monocytes Absolute 0.3  0.1 - 1.0 K/uL   Eosinophils Absolute 0.0  0.0 - 0.7 K/uL    Basophils Absolute 0.0  0.0 - 0.1 K/uL   RBC Morphology POLYCHROMASIA PRESENT     Comment: TARGET CELLS     BASOPHILIC STIPPLING  PRO B NATRIURETIC PEPTIDE     Status: Abnormal   Collection Time    03/26/13  4:01 AM      Result Value Range   Pro B Natriuretic peptide (BNP) 1536.0 (*) 0 - 125 pg/mL  TROPONIN I     Status: None   Collection Time    03/26/13  4:01 AM      Result Value Range   Troponin I <0.30  <0.30 ng/mL   Comment:            Due to the release kinetics of cTnI,     a negative result within the first hours     of the onset of symptoms does not rule out     myocardial infarction with certainty.     If myocardial infarction is still suspected,     repeat the test at appropriate intervals.  BLOOD GAS, ARTERIAL     Status: Abnormal   Collection Time    03/26/13  4:05 AM      Result Value Range   O2 Content 6.0     Delivery systems NASAL CANNULA     pH, Arterial 7.313 (*) 7.350 - 7.450   pCO2 arterial 75.2 (*) 35.0 - 45.0 mmHg   Comment: CRITICAL RESULT CALLED TO, READ BACK BY AND VERIFIED WITH:     DR DETERDING AT 9735 BY ANNALISSA BAYLOR,RRT,RCP ON 03/26/13   pO2, Arterial 109.0 (*) 80.0 - 100.0 mmHg   Bicarbonate 37.0 (*) 20.0 - 24.0 mEq/L   TCO2 34.3  0 - 100 mmol/L   Acid-Base Excess 8.7 (*) 0.0 - 2.0 mmol/L   O2 Saturation 98.0     Patient temperature 98.6     Collection site LEFT RADIAL     Drawn by [id number]     Sample type ARTERIAL     Allens test (pass/fail) PASS  PASS  MAGNESIUM     Status: None   Collection Time    03/27/13  3:58 AM      Result Value Range   Magnesium 2.2  1.5 - 2.5 mg/dL  PHOSPHORUS     Status: Abnormal   Collection Time    03/27/13  3:58 AM      Result Value Range   Phosphorus 2.0 (*) 2.3 - 4.6 mg/dL  BASIC METABOLIC PANEL     Status: Abnormal   Collection Time    03/27/13  3:58 AM      Result Value Range   Sodium 139  137 - 147 mEq/L   Potassium 3.8  3.7 - 5.3 mEq/L   Chloride 94 (*) 96 - 112 mEq/L   CO2 40 (*) 19 - 32  mEq/L   Comment: CRITICAL RESULT CALLED TO, READ BACK BY AND VERIFIED WITH:     JOHNSON,T RN _0  ON 01.26.2015 BY MCREYNOLDS,B   Glucose, Bld 144 (*) 70 - 99 mg/dL   BUN 10  6 - 23 mg/dL   Creatinine, Ser 0.43 (*) 0.50 - 1.10 mg/dL   Calcium 8.5  8.4 - 10.5 mg/dL   GFR calc non Af Amer >90  >90 mL/min  GFR calc Af Amer >90  >90 mL/min   Comment: (NOTE)     The eGFR has been calculated using the CKD EPI equation.     This calculation has not been validated in all clinical situations.     eGFR's persistently <90 mL/min signify possible Chronic Kidney     Disease.  CBC WITH DIFFERENTIAL     Status: Abnormal   Collection Time    03/27/13  3:58 AM      Result Value Range   WBC 6.4  4.0 - 10.5 K/uL   RBC 3.87  3.87 - 5.11 MIL/uL   Hemoglobin 11.3 (*) 12.0 - 15.0 g/dL   HCT 36.3  36.0 - 46.0 %   MCV 93.8  78.0 - 100.0 fL   MCH 29.2  26.0 - 34.0 pg   MCHC 31.1  30.0 - 36.0 g/dL   RDW 14.0  11.5 - 15.5 %   Platelets 188  150 - 400 K/uL   Neutrophils Relative % 85 (*) 43 - 77 %   Neutro Abs 5.4  1.7 - 7.7 K/uL   Lymphocytes Relative 10 (*) 12 - 46 %   Lymphs Abs 0.7  0.7 - 4.0 K/uL   Monocytes Relative 5  3 - 12 %   Monocytes Absolute 0.3  0.1 - 1.0 K/uL   Eosinophils Relative 0  0 - 5 %   Eosinophils Absolute 0.0  0.0 - 0.7 K/uL   Basophils Relative 0  0 - 1 %   Basophils Absolute 0.0  0.0 - 0.1 K/uL   Labs are reviewed.  Current Facility-Administered Medications  Medication Dose Route Frequency Provider Last Rate Last Dose  . albuterol (PROVENTIL) (2.5 MG/3ML) 0.083% nebulizer solution 2.5 mg  2.5 mg Nebulization Q2H PRN Brand Males, MD      . aspirin EC tablet 81 mg  81 mg Oral Daily Toy Baker, MD   81 mg at 03/27/13 0935  . budesonide (PULMICORT) nebulizer solution 0.5 mg  0.5 mg Nebulization BID Chesley Mires, MD   0.5 mg at 03/27/13 0738  . diazepam (VALIUM) tablet 10 mg  10 mg Oral Q12H Brand Males, MD   10 mg at 03/27/13 0950  . dicyclomine (BENTYL)  tablet 20 mg  20 mg Oral TID AC Orson Eva, MD   20 mg at 03/27/13 1153  . docusate sodium (COLACE) capsule 100 mg  100 mg Oral BID Toy Baker, MD   100 mg at 03/27/13 0935  . enoxaparin (LOVENOX) injection 40 mg  40 mg Subcutaneous Q24H Toy Baker, MD   40 mg at 03/27/13 0932  . fentaNYL (SUBLIMAZE) injection 25-100 mcg  25-100 mcg Intravenous Q2H PRN Brand Males, MD   50 mcg at 03/27/13 1152  . fluticasone (FLONASE) 50 MCG/ACT nasal spray 2 spray  2 spray Each Nare BID Erick Colace, NP   2 spray at 03/27/13 1000  . guaiFENesin (MUCINEX) 12 hr tablet 600 mg  600 mg Oral BID Toy Baker, MD   600 mg at 03/27/13 0934  . ipratropium-albuterol (DUONEB) 0.5-2.5 (3) MG/3ML nebulizer solution 3 mL  3 mL Nebulization Q4H Brand Males, MD   3 mL at 03/27/13 1617  . lactose free nutrition (BOOST PLUS) liquid 237 mL  237 mL Oral BID BM Christie Beckers, RD   237 mL at 03/27/13 1518  . midazolam (VERSED) injection 1-2 mg  1-2 mg Intravenous Q4H PRN Orson Eva, MD   2 mg at 03/26/13 2135  . ondansetron (  ZOFRAN) tablet 4 mg  4 mg Oral Q6H PRN Toy Baker, MD   4 mg at 03/24/13 0012   Or  . ondansetron (ZOFRAN) injection 4 mg  4 mg Intravenous Q6H PRN Toy Baker, MD   4 mg at 03/23/13 1150  . pantoprazole (PROTONIX) EC tablet 40 mg  40 mg Oral BID AC Erick Colace, NP   40 mg at 03/27/13 4287  . polyethylene glycol (MIRALAX / GLYCOLAX) packet 17 g  17 g Oral Daily PRN Toy Baker, MD      . potassium phosphate 10 mmol in dextrose 5 % 250 mL infusion  10 mmol Intravenous Once Brand Males, MD   10 mmol at 03/27/13 1140  . [START ON 03/28/2013] predniSONE (DELTASONE) tablet 40 mg  40 mg Oral Q breakfast Brand Males, MD      . senna (SENOKOT) tablet 17.2 mg  2 tablet Oral Daily Orson Eva, MD   17.2 mg at 03/27/13 0935  . sodium chloride (OCEAN) 0.65 % nasal spray 1 spray  1 spray Each Nare QID Erick Colace, NP   1 spray at 03/27/13 [phone]  . sodium  chloride 0.9 % injection 3 mL  3 mL Intravenous Q12H Toy Baker, MD   3 mL at 03/27/13 0938    Psychiatric Specialty Exam:     Blood pressure 142/68, pulse 95, temperature 97.1 F (36.2 C), temperature source Axillary, resp. rate 21, height 5' 7.5" (1.715 m), weight 146 lb 6.2 oz (66.4 kg), SpO2 96.00%.Body mass index is 22.58 kg/(m^2).  General Appearance: Restless and anxious  Eye Contact::  Fair  Speech:  Slow  Volume:  Normal  Mood:  Anxious and Irritable  Affect:  Appropriate  Thought Process:  Coherent  Orientation:  Full (Time, Place, and Person)  Thought Content:  Rumination  Suicidal Thoughts:  No  Homicidal Thoughts:  No  Memory:  Immediate;   Fair Recent;   Fair Remote;   Fair  Judgement:  Fair  Insight:  Fair  Psychomotor Activity:  Increased  Concentration:  Fair  Recall:  Fair  Akathisia:  No  Handed:  Right  AIMS (if indicated):     Assets:  Communication Skills Desire for Improvement  Sleep:      Treatment Plan Summary: Medication management Patient is getting Valium 10 mg every 12 hours.  She is not interested to see psychiatrist and wanted to get some rest.  She endorsed feeling very tired and wanted to get some sleep.  Please call [phone] if patient still requires and agreed to be seen by a psychiatrist.    Ciaran Begay T. 03/27/2013 4:59 PM

## 2013-03-28 LAB — BASIC METABOLIC PANEL
BUN: 8 mg/dL (ref 6–23)
CALCIUM: 8.6 mg/dL (ref 8.4–10.5)
CO2: 44 mEq/L (ref 19–32)
CREATININE: 0.51 mg/dL (ref 0.50–1.10)
Chloride: 93 mEq/L — ABNORMAL LOW (ref 96–112)
GFR calc non Af Amer: >90 mL/min (ref >90)
GLUCOSE: 88 mg/dL (ref 70–99)
Potassium: 4 mEq/L (ref 3.7–5.3)
Sodium: 144 mEq/L (ref 137–147)

## 2013-03-28 LAB — CBC WITH DIFFERENTIAL/PLATELET
BASOS ABS: 0 10*3/uL (ref 0.0–0.1)
Basophils Relative: 0 % (ref 0–1)
Eosinophils Absolute: 0 10*3/uL (ref 0.0–0.7)
Eosinophils Relative: 0 % (ref 0–5)
HCT: 38.3 % (ref 36.0–46.0)
HEMOGLOBIN: 12.1 g/dL (ref 12.0–15.0)
Lymphocytes Relative: 31 % (ref 12–46)
Lymphs Abs: 2.7 10*3/uL (ref 0.7–4.0)
MCH: 29.7 pg (ref 26.0–34.0)
MCHC: 31.6 g/dL (ref 30.0–36.0)
MCV: 94.1 fL (ref 78.0–100.0)
MONOS PCT: 10 % (ref 3–12)
Monocytes Absolute: 0.8 10*3/uL (ref 0.1–1.0)
NEUTROS ABS: 5 10*3/uL (ref 1.7–7.7)
NEUTROS PCT: 59 % (ref 43–77)
PLATELETS: 231 10*3/uL (ref 150–400)
RBC: 4.07 MIL/uL (ref 3.87–5.11)
RDW: 14 % (ref 11.5–15.5)
WBC: 8.5 10*3/uL (ref 4.0–10.5)

## 2013-03-28 LAB — PHOSPHORUS: Phosphorus: 1.6 mg/dL — ABNORMAL LOW (ref 2.3–4.6)

## 2013-03-28 LAB — MAGNESIUM: Magnesium: 2 mg/dL (ref 1.5–2.5)

## 2013-03-28 MED ORDER — OXYCODONE HCL 5 MG PO TABS
15.0000 mg | ORAL_TABLET | ORAL | Status: DC | PRN
Start: 2013-03-28 — End: 2013-03-28
  Administered 2013-03-28: 15 mg via ORAL
  Filled 2013-03-28 (×2): qty 3

## 2013-03-28 MED ORDER — POTASSIUM PHOSPHATE DIBASIC 3 MMOLE/ML IV SOLN
24.0000 mmol | Freq: Once | INTRAVENOUS | Status: AC
  Administered 2013-03-28: 24 mmol via INTRAVENOUS
  Filled 2013-03-28 (×2): qty 8

## 2013-03-28 MED ORDER — LORAZEPAM 2 MG/ML IJ SOLN
1.0000 mg | INTRAMUSCULAR | Status: DC
  Administered 2013-03-28 – 2013-03-30 (×10): 1 mg via INTRAVENOUS
  Filled 2013-03-28 (×10): qty 1
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
## 2013-03-29 DIAGNOSIS — F411 Generalized anxiety disorder: Secondary | ICD-10-CM

## 2013-03-29 LAB — BASIC METABOLIC PANEL
BUN: 7 mg/dL (ref 6–23)
CALCIUM: 8.7 mg/dL (ref 8.4–10.5)
CO2: 42 mEq/L (ref 19–32)
CREATININE: 0.56 mg/dL (ref 0.50–1.10)
Chloride: 89 mEq/L — ABNORMAL LOW (ref 96–112)
GFR calc Af Amer: >90 mL/min (ref >90)
Glucose, Bld: 166 mg/dL — ABNORMAL HIGH (ref 70–99)
Potassium: 3.5 mEq/L — ABNORMAL LOW (ref 3.7–5.3)
SODIUM: 139 meq/L (ref 137–147)

## 2013-03-29 LAB — CULTURE, BLOOD (ROUTINE X 2)
Culture: NO GROWTH
Culture: NO GROWTH

## 2013-03-29 LAB — CBC WITH DIFFERENTIAL/PLATELET
Basophils Absolute: 0 10*3/uL (ref 0.0–0.1)
Basophils Relative: 0 % (ref 0–1)
EOS ABS: 0.1 10*3/uL (ref 0.0–0.7)
EOS PCT: 1 % (ref 0–5)
HCT: 38.5 % (ref 36.0–46.0)
Hemoglobin: 12.1 g/dL (ref 12.0–15.0)
LYMPHS PCT: 26 % (ref 12–46)
Lymphs Abs: 2.9 10*3/uL (ref 0.7–4.0)
MCH: 29.7 pg (ref 26.0–34.0)
MCHC: 31.4 g/dL (ref 30.0–36.0)
MCV: 94.4 fL (ref 78.0–100.0)
Monocytes Absolute: 0.9 10*3/uL (ref 0.1–1.0)
Monocytes Relative: 8 % (ref 3–12)
NEUTROS PCT: 64 % (ref 43–77)
Neutro Abs: 7.2 10*3/uL (ref 1.7–7.7)
PLATELETS: 242 10*3/uL (ref 150–400)
RBC: 4.08 MIL/uL (ref 3.87–5.11)
RDW: 14.3 % (ref 11.5–15.5)
WBC: 11.2 10*3/uL — AB (ref 4.0–10.5)

## 2013-03-29 LAB — MAGNESIUM: Magnesium: 1.8 mg/dL (ref 1.5–2.5)

## 2013-03-29 LAB — PHOSPHORUS: PHOSPHORUS: 4.1 mg/dL (ref 2.3–4.6)

## 2013-03-29 MED ORDER — METOCLOPRAMIDE HCL 5 MG/ML IJ SOLN
5.0000 mg | Freq: Once | INTRAMUSCULAR | Status: AC
  Administered 2013-03-29: 5 mg via INTRAVENOUS
  Filled 2013-03-29: qty 2

## 2013-03-29 MED ORDER — POTASSIUM CHLORIDE CRYS ER 20 MEQ PO TBCR
40.0000 meq | EXTENDED_RELEASE_TABLET | Freq: Once | ORAL | Status: AC
  Administered 2013-03-29: 40 meq via ORAL
  Filled 2013-03-29: qty 2

## 2013-03-29 MED ORDER — PREDNISONE 20 MG PO TABS
30.0000 mg | ORAL_TABLET | Freq: Every day | ORAL | Status: DC
  Administered 2013-03-29 – 2013-03-30 (×2): 30 mg via ORAL
  Filled 2013-03-29 (×4): qty 1

## 2013-03-29 NOTE — Progress Notes (Signed)
[id number] Bethany Plater, RN, BSN, CCM, [phone] Chart reviewed for update of needs and condition. Temp 99.  desated x1 on [id number]% on 6l/02/min/Lynn,wbc increased from 8.0 to 11.0.  Tachy at 103.  Resp. Rate=24
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
## 2013-03-29 NOTE — Significant Event (Signed)
Pt had questions about her plan of care.  I reviewed that she has metapneumovirus causing exacerbation of COPD.  This his also complicated by diastolic heart failure.  She reports difficulty with her emotions and feeling anxious.  Explained to role of using nebulizer medication in place of her inhalers while she is acutely ill, and role for prednisone therapy in relation to AECOPD.  I explained that she has several medications available to assist with her feelings of anxiety, and these will be adjusted as needed.  Coralyn HellingVineet Jaquala Fuller, MD 03/29/2013, 7:42 PM

## 2013-03-29 NOTE — Plan of Care (Signed)
Problem: Phase I Progression Outcomes Goal: O2 sats > or equal 90% or at baseline Outcome: Progressing Desats when up to bedside commode and when o2 is taken off.

## 2013-03-30 ENCOUNTER — Encounter (HOSPITAL_COMMUNITY): Payer: Self-pay | Admitting: Radiology

## 2013-03-30 ENCOUNTER — Inpatient Hospital Stay (HOSPITAL_COMMUNITY): Payer: Medicaid Other

## 2013-03-30 LAB — PHOSPHORUS: PHOSPHORUS: 2.2 mg/dL — AB (ref 2.3–4.6)

## 2013-03-30 LAB — EXPECTORATED SPUTUM ASSESSMENT W GRAM STAIN, RFLX TO RESP C: Special Requests: NORMAL

## 2013-03-30 LAB — CBC WITH DIFFERENTIAL/PLATELET
BASOS ABS: 0 10*3/uL (ref 0.0–0.1)
BASOS PCT: 0 % (ref 0–1)
Eosinophils Absolute: 0.1 10*3/uL (ref 0.0–0.7)
Eosinophils Relative: 1 % (ref 0–5)
HCT: 39.5 % (ref 36.0–46.0)
Hemoglobin: 11.9 g/dL — ABNORMAL LOW (ref 12.0–15.0)
Lymphocytes Relative: 28 % (ref 12–46)
Lymphs Abs: 3.1 10*3/uL (ref 0.7–4.0)
MCH: 28.5 pg (ref 26.0–34.0)
MCHC: 30.1 g/dL (ref 30.0–36.0)
MCV: 94.7 fL (ref 78.0–100.0)
Monocytes Absolute: 0.9 10*3/uL (ref 0.1–1.0)
Monocytes Relative: 8 % (ref 3–12)
NEUTROS ABS: 7.1 10*3/uL (ref 1.7–7.7)
Neutrophils Relative %: 63 % (ref 43–77)
PLATELETS: 298 10*3/uL (ref 150–400)
RBC: 4.17 MIL/uL (ref 3.87–5.11)
RDW: 14.6 % (ref 11.5–15.5)
WBC: 11.2 10*3/uL — ABNORMAL HIGH (ref 4.0–10.5)

## 2013-03-30 LAB — MAGNESIUM: MAGNESIUM: 2.1 mg/dL (ref 1.5–2.5)

## 2013-03-30 LAB — BASIC METABOLIC PANEL
BUN: 6 mg/dL (ref 6–23)
CALCIUM: 9 mg/dL (ref 8.4–10.5)
CO2: 40 mEq/L (ref 19–32)
CREATININE: 0.51 mg/dL (ref 0.50–1.10)
Chloride: 93 mEq/L — ABNORMAL LOW (ref 96–112)
GFR calc Af Amer: >90 mL/min (ref >90)
GFR calc non Af Amer: >90 mL/min (ref >90)
Glucose, Bld: 119 mg/dL — ABNORMAL HIGH (ref 70–99)
Potassium: 3.9 mEq/L (ref 3.7–5.3)
Sodium: 139 mEq/L (ref 137–147)

## 2013-03-30 LAB — PRO B NATRIURETIC PEPTIDE: PRO B NATRI PEPTIDE: 136.4 pg/mL — AB (ref 0–125)

## 2013-03-30 MED ORDER — DEXTROSE 5 % IV SOLN
10.0000 mmol | Freq: Once | INTRAVENOUS | Status: AC
  Administered 2013-03-30: 10 mmol via INTRAVENOUS
  Filled 2013-03-30: qty 3.33

## 2013-03-30 MED ORDER — IOHEXOL 350 MG/ML SOLN
100.0000 mL | Freq: Once | INTRAVENOUS | Status: AC | PRN
  Administered 2013-03-30: 100 mL via INTRAVENOUS

## 2013-03-30 MED ORDER — CLONAZEPAM 1 MG PO TABS
1.0000 mg | ORAL_TABLET | Freq: Three times a day (TID) | ORAL | Status: DC
  Administered 2013-03-30 – 2013-04-03 (×12): 1 mg via ORAL
  Filled 2013-03-30 (×12): qty 1

## 2013-03-30 NOTE — Progress Notes (Signed)
Clinical Social Work Progress Note PSYCHIATRY SERVICE LINE 03/30/2013  Patient:  [name]  Account:  [id number]  Admit Date:  03/22/2013  Clinical Social Worker:  Unk LightningHOLLY Armany Mano, LCSW  Date/Time:  03/30/2013 01:30 PM  Review of Patient  Overall Medical Condition:   Patient reports she is still feeling weak and having trouble breathing. Patient reports that she is very anxious and is unsure if medications are effective.   Participation Level:  Minimal  Participation Quality  Drowsy   Other Participation Quality:   Patient laying in bed with eyes closed and minimally engaged.   Affect  Flat   Cognitive  Oriented   Reaction to Medications/Concerns:   Patient reports she needs medications managed for anxiety.   Modes of Intervention  Support   Summary of Progress/Plan at Discharge   Patient laying in bed and husband visiting in room. Patient drowsy and minimally engaged.    Patient reports she is still not feeling well and is not open to talking much today. Patient does report significant anxiety still and feels that she needs medications adjusted. CSW attempted to talk with patient about specific symptoms but patient reports she does not want to talk at this time. CSW explained that psych MD could assist with managing medications. Patient reports she does not want to talk to anyone today but agreeable to psych MD tomorrow.    [facility] asked that patient be put on consult list for 1/30. CSW will continue to follow.    East AllianceHolly Marie Chow, [place]CS[place] [phone]
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
## 2013-03-30 NOTE — Progress Notes (Signed)
Critical Value: CO2 40   Value relatively unchanged from previous levels.
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
## 2013-03-30 NOTE — Progress Notes (Signed)
NUTRITION FOLLOW UP  Intervention:   1. Continue Boost Plus BID 2. Encourage increase intake of meals 3. Will continue to monitor  Nutrition Dx:   Inadequate oral intake related to poor appetite as evidenced by pt report. - resolving  Goal:   Pt to consume >90% of meals/supplements - improving  Monitor:   Weights, labs, PO intake  Assessment:   Pt with history of TIA (transient ischemic attack); GERD (gastroesophageal reflux disease); Hepatomegaly; Colonic inertia; Lung abscess; COPD (chronic obstructive pulmonary disease); Stroke (2009); PTSD (post-traumatic stress disorder); Bipolar disorder; Depression; Chronic headache; Seizures; Fibromyalgia; Iron deficiency anemia; Arthritis; Acute ischemic colitis (11/01/2012); Marijuana abuse (2014); and TIA (transient ischemic attack). Admitted with cough, worsening fever and shortness of breath that started 3 days PTA. Found to have acute respiratory failure likely due to COPD exacerbation.   Pt reports her appetite getting better when compared to the last couple of days. She reports not eating much since admission, but is feeling more hungry and says shes making up for it. Meal completion at breakfast was 50%. Pt has been drinking Boost and likes it. Pt reports she has been feeling better and has been breathing better. Pt has been satisfied with her meals and reports she has been getting enough to eat and does not want any extra snacks. Pt reports she thinks the prednisone is causing her to have panic attacks and increase in anxiety.   Height: Ht Readings from Last 1 Encounters:  03/23/13 5' 7.5" (1.715 m)    Weight Status:   Wt Readings from Last 1 Encounters:  03/27/13 146 lb 6.2 oz (66.4 kg)  03/24/13 141 lbs  Re-estimated needs:  Kcal: 1600-1800  Protein: 75-90g  Fluid: 1.6-1.8L/day   Skin: Petechiae/bruises in groin area - says new due to bed pan on 03/27/13  Diet Order: Cardiac   Intake/Output Summary (Last 24 hours) at 03/30/13  1354 Last data filed at 03/30/13 1041  Gross per 24 hour  Intake    902 ml  Output   1300 ml  Net   -398 ml    Last BM: 1/26   Labs:   Recent Labs Lab 03/28/13 0335 03/29/13 0620 03/30/13 0330  NA 144 139 139  K 4.0 3.5* 3.9  CL 93* 89* 93*  CO2 44* 42* 40*  BUN 8 7 6   CREATININE 0.51 0.56 0.51  CALCIUM 8.6 8.7 9.0  MG 2.0 1.8 2.1  PHOS 1.6* 4.1 2.2*  GLUCOSE 88 166* 119*    CBG (last 3)  No results found for this basename: GLUCAP,  in the last 72 hours  Scheduled Meds: . aspirin EC  81 mg Oral Daily  . budesonide (PULMICORT) nebulizer solution  0.5 mg Nebulization BID  . clonazePAM  1 mg Oral Q8H  . diazepam  10 mg Oral Q12H  . dicyclomine  20 mg Oral TID AC  . docusate sodium  100 mg Oral BID  . enoxaparin (LOVENOX) injection  40 mg Subcutaneous Q24H  . fluticasone  2 spray Each Nare BID  . guaiFENesin  600 mg Oral BID  . ipratropium-albuterol  3 mL Nebulization Q4H  . lactose free nutrition  237 mL Oral BID BM  . pantoprazole  40 mg Oral BID AC  . potassium phosphate IVPB (mmol)  10 mmol Intravenous Once  . predniSONE  30 mg Oral Q breakfast  . senna  2 tablet Oral Daily  . sodium chloride  1 spray Each Nare QID  . sodium chloride  3 mL Intravenous Q12H    Marijean Niemann Dietetic Intern Pager: [phone]   I agree with above documentation.  Levon Hedger MS, RD, LDN [phone] Pager [phone] After Hours Pager

## 2013-03-31 LAB — CBC WITH DIFFERENTIAL/PLATELET
BASOS ABS: 0 10*3/uL (ref 0.0–0.1)
Basophils Relative: 0 % (ref 0–1)
EOS PCT: 2 % (ref 0–5)
Eosinophils Absolute: 0.1 10*3/uL (ref 0.0–0.7)
HEMATOCRIT: 36.4 % (ref 36.0–46.0)
Hemoglobin: 11.5 g/dL — ABNORMAL LOW (ref 12.0–15.0)
LYMPHS ABS: 2.7 10*3/uL (ref 0.7–4.0)
LYMPHS PCT: 28 % (ref 12–46)
MCH: 29.8 pg (ref 26.0–34.0)
MCHC: 31.6 g/dL (ref 30.0–36.0)
MCV: 94.3 fL (ref 78.0–100.0)
MONO ABS: 0.8 10*3/uL (ref 0.1–1.0)
Monocytes Relative: 8 % (ref 3–12)
Neutro Abs: 5.9 10*3/uL (ref 1.7–7.7)
Neutrophils Relative %: 63 % (ref 43–77)
Platelets: 314 10*3/uL (ref 150–400)
RBC: 3.86 MIL/uL — ABNORMAL LOW (ref 3.87–5.11)
RDW: 14.7 % (ref 11.5–15.5)
WBC: 9.5 10*3/uL (ref 4.0–10.5)

## 2013-03-31 LAB — BASIC METABOLIC PANEL
BUN: 11 mg/dL (ref 6–23)
CO2: 36 meq/L — AB (ref 19–32)
Calcium: 8.9 mg/dL (ref 8.4–10.5)
Chloride: 94 mEq/L — ABNORMAL LOW (ref 96–112)
Creatinine, Ser: 0.56 mg/dL (ref 0.50–1.10)
GFR calc Af Amer: >90 mL/min (ref >90)
GFR calc non Af Amer: >90 mL/min (ref >90)
GLUCOSE: 97 mg/dL (ref 70–99)
Potassium: 4.2 mEq/L (ref 3.7–5.3)
SODIUM: 140 meq/L (ref 137–147)

## 2013-03-31 LAB — PHOSPHORUS: Phosphorus: 2.2 mg/dL — ABNORMAL LOW (ref 2.3–4.6)

## 2013-03-31 LAB — MAGNESIUM: Magnesium: 2 mg/dL (ref 1.5–2.5)

## 2013-03-31 MED ORDER — PREDNISONE 10 MG PO TABS
10.0000 mg | ORAL_TABLET | Freq: Every day | ORAL | Status: AC
  Administered 2013-04-02: 10 mg via ORAL
  Filled 2013-03-31 (×2): qty 1

## 2013-03-31 MED ORDER — IPRATROPIUM-ALBUTEROL 0.5-2.5 (3) MG/3ML IN SOLN
3.0000 mL | Freq: Four times a day (QID) | RESPIRATORY_TRACT | Status: DC
  Administered 2013-03-31 – 2013-04-01 (×6): 3 mL via RESPIRATORY_TRACT
  Filled 2013-03-31 (×5): qty 3
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
## 2013-04-01 LAB — BASIC METABOLIC PANEL
BUN: 11 mg/dL (ref 6–23)
CO2: 37 mEq/L — ABNORMAL HIGH (ref 19–32)
Calcium: 9.2 mg/dL (ref 8.4–10.5)
Chloride: 94 mEq/L — ABNORMAL LOW (ref 96–112)
Creatinine, Ser: 0.61 mg/dL (ref 0.50–1.10)
GFR calc non Af Amer: >90 mL/min (ref >90)
Glucose, Bld: 113 mg/dL — ABNORMAL HIGH (ref 70–99)
POTASSIUM: 4.2 meq/L (ref 3.7–5.3)
SODIUM: 139 meq/L (ref 137–147)

## 2013-04-01 LAB — CBC WITH DIFFERENTIAL/PLATELET
BASOS ABS: 0 10*3/uL (ref 0.0–0.1)
Basophils Relative: 0 % (ref 0–1)
Eosinophils Absolute: 0.5 10*3/uL (ref 0.0–0.7)
Eosinophils Relative: 4 % (ref 0–5)
HCT: 41.3 % (ref 36.0–46.0)
Hemoglobin: 12.8 g/dL (ref 12.0–15.0)
LYMPHS ABS: 4.4 10*3/uL — AB (ref 0.7–4.0)
LYMPHS PCT: 39 % (ref 12–46)
MCH: 29.3 pg (ref 26.0–34.0)
MCHC: 31 g/dL (ref 30.0–36.0)
MCV: 94.5 fL (ref 78.0–100.0)
Monocytes Absolute: 1 10*3/uL (ref 0.1–1.0)
Monocytes Relative: 9 % (ref 3–12)
NEUTROS PCT: 49 % (ref 43–77)
Neutro Abs: 5.5 10*3/uL (ref 1.7–7.7)
PLATELETS: 352 10*3/uL (ref 150–400)
RBC: 4.37 MIL/uL (ref 3.87–5.11)
RDW: 14.7 % (ref 11.5–15.5)
WBC: 11.3 10*3/uL — ABNORMAL HIGH (ref 4.0–10.5)

## 2013-04-01 LAB — MAGNESIUM: MAGNESIUM: 2.1 mg/dL (ref 1.5–2.5)

## 2013-04-01 LAB — PHOSPHORUS: PHOSPHORUS: 3.2 mg/dL (ref 2.3–4.6)
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
## 2013-04-01 NOTE — Evaluation (Addendum)
Physical Therapy Evaluation Patient Details Name: MADILINE SAFFRAN MRN: [id number] DOB: [birth year] Today's Date: 04/01/2013 Time: 7425-9563 PT Time Calculation (min): 16 min  PT Assessment / Plan / Recommendation History of Present Illness  53 year old female with history of COPD on home or go, active smoker, history of anxiety and PTSD who was admitted on 1/20 one week 3 day history of cough, fever and increased shortness of breath with findings of COPD exacerbation. A nasal swab was positive for Midrin Vitas. Patient was transferred to ICU under PCCM for worsening respiratory failure and required BiPAP, IV steroid, empiric antibiotic and duo nebs. Patient now stable and transferred to a prior hospitalist. Patient also noted to have severe anxiety.  Clinical Impression  Pt  Will benefit from PT to address deficits below;     PT Assessment  Patient needs continued PT services    Follow Up Recommendations  No PT follow up    Does the patient have the potential to tolerate intense rehabilitation      Barriers to Discharge        Equipment Recommendations  None recommended by PT    Recommendations for Other Services     Frequency Min 2X/week    Precautions / Restrictions Precautions Precaution Comments: monitor sats and HR   Pertinent Vitals/Pain Sats 100% on 4L at rest sats 97% during amb on4L HR 119-120 with activity  Changed back to moisturized air once back to room      Mobility  Transfers Overall transfer level: Needs assistance Equipment used: 4-wheeled walker Transfers: Sit to/from Stand Sit to Stand: Min guard;Supervision General transfer comment: cues for safe technique Ambulation/Gait Ambulation/Gait assistance: Min guard;Supervision Ambulation Distance (Feet): 50 Feet Assistive device: 4-wheeled walker Gait Pattern/deviations: Step-through pattern;Trunk flexed Gait velocity: decr    Exercises     PT Diagnosis: Difficulty walking  PT Problem List:  Decreased activity tolerance;Decreased balance;Decreased mobility;Decreased knowledge of use of DME PT Treatment Interventions: DME instruction;Therapeutic activities;Therapeutic exercise;Functional mobility training;Patient/family education;Gait training;Stair training     PT Goals(Current goals can be found in the care plan section) Acute Rehab PT Goals Patient Stated Goal: get stronger and go home PT Goal Formulation: With patient Time For Goal Achievement: 04/08/13 Potential to Achieve Goals: Good  Visit Information  Last PT Received On: 04/01/13 Assistance Needed: +1 History of Present Illness: 53 year old female with history of COPD on home or go, active smoker, history of anxiety and PTSD who was admitted on 1/20 one week 3 day history of cough, fever and increased shortness of breath with findings of COPD exacerbation. A nasal swab was positive for Midrin Vitas. Patient was transferred to ICU under PCCM for worsening respiratory failure and required BiPAP, IV steroid, empiric antibiotic and duo nebs. Patient now stable and transferred to a prior hospitalist. Patient also noted to have severe anxiety.       Prior Functioning  Home Living Family/patient expects to be discharged to:: Private residence Living Arrangements: Spouse/significant other Available Help at Discharge: Family;Available 24 hours/day Type of Home: Mobile home Home Access: Stairs to enter Entrance Stairs-Number of Steps: 3-4 Home Layout: One level Home Equipment: Walker - 4 wheels Prior Function Level of Independence: Independent;Independent with assistive device(s) Comments: used 4 wheeled RW at times Communication Communication: No difficulties    Cognition  Cognition Arousal/Alertness: Awake/alert Behavior During Therapy: Flat affect Overall Cognitive Status:  (pt repeats same scenario, perserverates on having had fever and gotten very ill, requires redirection to task)    Extremity/Trunk Assessment  Upper Extremity Assessment Upper Extremity Assessment: Generalized weakness Lower Extremity Assessment Lower Extremity Assessment: Generalized weakness   Balance Balance Overall balance assessment: Needs assistance Standing balance support: During functional activity;No upper extremity supported;Bilateral upper extremity supported Standing balance-Leahy Scale: Good High Level Balance Comments: fatigues very quickly  End of Session PT - End of Session Equipment Utilized During Treatment: Gait belt Activity Tolerance: Patient limited by fatigue Patient left: in bed;with call bell/phone within reach Nurse Communication: Mobility status  GP     [facility]-ErWILLIAMS,Kailly Richoux 04/01/2013, 5:46 PM

## 2013-04-02 LAB — BASIC METABOLIC PANEL
BUN: 8 mg/dL (ref 6–23)
CO2: 38 meq/L — AB (ref 19–32)
Calcium: 9.3 mg/dL (ref 8.4–10.5)
Chloride: 96 mEq/L (ref 96–112)
Creatinine, Ser: 0.7 mg/dL (ref 0.50–1.10)
GFR calc Af Amer: >90 mL/min (ref >90)
GFR calc non Af Amer: >90 mL/min (ref >90)
GLUCOSE: 116 mg/dL — AB (ref 70–99)
Potassium: 4.3 mEq/L (ref 3.7–5.3)
SODIUM: 142 meq/L (ref 137–147)

## 2013-04-02 LAB — CULTURE, RESPIRATORY

## 2013-04-02 LAB — CULTURE, RESPIRATORY W GRAM STAIN: Culture: NORMAL

## 2013-04-02 MED ORDER — TIOTROPIUM BROMIDE MONOHYDRATE 18 MCG IN CAPS
18.0000 ug | ORAL_CAPSULE | Freq: Every day | RESPIRATORY_TRACT | Status: DC
  Administered 2013-04-02 – 2013-04-03 (×2): 18 ug via RESPIRATORY_TRACT
  Filled 2013-04-02: qty 5

## 2013-04-02 MED ORDER — ALBUTEROL SULFATE (2.5 MG/3ML) 0.083% IN NEBU: 2.5000 mg | INHALATION_SOLUTION | RESPIRATORY_TRACT | Status: DC | PRN

## 2013-04-02 MED ORDER — ALBUTEROL SULFATE (2.5 MG/3ML) 0.083% IN NEBU
2.5000 mg | INHALATION_SOLUTION | Freq: Four times a day (QID) | RESPIRATORY_TRACT | Status: DC
  Administered 2013-04-02 – 2013-04-03 (×6): 2.5 mg via RESPIRATORY_TRACT
  Filled 2013-04-02 (×6): qty 3

## 2013-04-02 MED ORDER — IPRATROPIUM-ALBUTEROL 0.5-2.5 (3) MG/3ML IN SOLN: 3.0000 mL | Freq: Four times a day (QID) | RESPIRATORY_TRACT | Status: DC | PRN
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
## 2013-04-03 DIAGNOSIS — Z720 Tobacco use: Secondary | ICD-10-CM | POA: Diagnosis present

## 2013-04-03 DIAGNOSIS — F411 Generalized anxiety disorder: Secondary | ICD-10-CM | POA: Diagnosis present

## 2013-04-03 MED ORDER — GUAIFENESIN ER 600 MG PO TB12: 600.0000 mg | ORAL_TABLET | Freq: Two times a day (BID) | ORAL | Status: DC

## 2013-04-03 MED ORDER — TRAMADOL-ACETAMINOPHEN 37.5-325 MG PO TABS
1.0000 | ORAL_TABLET | Freq: Four times a day (QID) | ORAL | Status: DC | PRN
  Administered 2013-04-03: 1 via ORAL
  Filled 2013-04-03 (×4): qty 1

## 2013-04-03 MED ORDER — BOOST PLUS PO LIQD: 237.0000 mL | Freq: Two times a day (BID) | ORAL | Status: AC

## 2013-04-03 MED ORDER — OXYCODONE HCL 15 MG PO TABS: 15.0000 mg | ORAL_TABLET | Freq: Four times a day (QID) | ORAL | Status: DC | PRN

## 2013-04-03 NOTE — Progress Notes (Signed)
Patient ambulated without oxygen approximately 150 ft and oxygen saturations dropped from 95% at rest with oxygen 3 L to 80% without oxygen.  Doctor notified, patient states she already has oxygen in room for discharge and at home for her use.

## 2013-04-03 NOTE — Progress Notes (Signed)
Discussed with patient the need to monitor her oxygen level continuously as ordered by the doctor and her concerns for not allowing this to be done because  It was reported that patient had been refusing this for last two days, which she denies.  Placed patient on continuous monitor
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
## 2013-04-03 NOTE — Progress Notes (Signed)
Clinical Social Work Progress Note PSYCHIATRY SERVICE LINE 04/03/2013  Patient:  [name]  Account:  [id number]  Admit Date:  03/22/2013  Clinical Social Worker:  Sindy Messing, LCSW  Date/Time:  04/03/2013 10:45 AM  Review of Patient  Overall Medical Condition:   Patient reports she is feeling better and is going to DC today.   Participation Level:  Active  Participation Quality  Appropriate   Other Participation Quality:   Patient engaged in session.   Affect  Flat   Cognitive  Appropriate   Reaction to Medications/Concerns:   None reported   Modes of Intervention  Support  Solution-Focused   Summary of Progress/Plan at Discharge   CSW met with patient in room while patient was packing up her belongings. Patient reports that she is being DC and is getting all of her belongings ready so that she can leave. Patient reports she is feeling better and is ready to go home.    CSW and patient spoke about patient's anxiety concerns. Patient reports that anxiety is back at baseline and she feels medication is effective. CSW inquired about patient's plans at DC to continue medication and patient reports that she is unsure about follow up at DC. Patient continues to report she will not go back to psychiatrist she has seen in the past. CSW provided patient with outpatient list and explained that patient could look for a new agency. Patient reviewed list and reports she will consider options.    Patient reports no further needs at this time and reports she is just ready to DC. CSW updated MD re: outpatient follow up. CSW is signing off but available if needed.      [place], [place] [phone]

## 2013-04-13 ENCOUNTER — Other Ambulatory Visit: Payer: Self-pay | Admitting: Orthopedic Surgery

## 2013-04-13 DIAGNOSIS — R0602 Shortness of breath: Secondary | ICD-10-CM

## 2013-04-13 DIAGNOSIS — R05 Cough: Secondary | ICD-10-CM

## 2013-04-13 DIAGNOSIS — R059 Cough, unspecified: Secondary | ICD-10-CM

## 2013-04-13 DIAGNOSIS — R509 Fever, unspecified: Secondary | ICD-10-CM

## 2013-04-17 ENCOUNTER — Telehealth: Payer: Self-pay | Admitting: Pulmonary Disease

## 2013-04-17 NOTE — Telephone Encounter (Signed)
Pt states that Dr Shelle Iron gave her a sample of Nicorette at [facility] in November  - Pt states this helping and would like an rx sent to [facility] on [address] - PT states that it needs to be the Fresh mint and not the original - Please advise if ok to send rx

## 2013-04-19 NOTE — Telephone Encounter (Signed)
Pt states that she found out her insurance will only pay for the original, they will not pay for the fresh mint.  Asks to speak w/ nurse & can be reached at [phone].  [name]

## 2013-04-19 NOTE — Telephone Encounter (Signed)
Spoke with the pt  She states has stopped smoking as of 2 wks ago  She is using nicorette fresh mint, which she likes but her ins will not cover She states needing samples of fresh mint gum  We do not have any samples of fresh mint, but we do have the fruit chill  She is willing to try this, and so will leave 1 box (all we have) up front  Nothing further needed

## 2013-05-02 ENCOUNTER — Other Ambulatory Visit: Payer: Self-pay | Admitting: Family Medicine

## 2013-05-02 DIAGNOSIS — R059 Cough, unspecified: Secondary | ICD-10-CM

## 2013-05-02 DIAGNOSIS — R05 Cough: Secondary | ICD-10-CM

## 2013-05-02 DIAGNOSIS — R0602 Shortness of breath: Secondary | ICD-10-CM

## 2013-05-02 DIAGNOSIS — R509 Fever, unspecified: Secondary | ICD-10-CM

## 2013-05-02 DIAGNOSIS — L905 Scar conditions and fibrosis of skin: Secondary | ICD-10-CM

## 2013-05-03 ENCOUNTER — Inpatient Hospital Stay: Admission: RE | Admit: 2013-05-03 | Payer: Self-pay | Source: Ambulatory Visit

## 2013-05-19 ENCOUNTER — Other Ambulatory Visit: Payer: Self-pay

## 2013-05-24 ENCOUNTER — Ambulatory Visit
Admission: RE | Admit: 2013-05-24 | Discharge: 2013-05-24 | Disposition: A | Discharge status: Home / Self Care | Payer: Medicaid Other | Source: Ambulatory Visit | Attending: Family Medicine | Admitting: Family Medicine

## 2013-05-24 DIAGNOSIS — L905 Scar conditions and fibrosis of skin: Secondary | ICD-10-CM

## 2013-06-21 ENCOUNTER — Encounter (HOSPITAL_COMMUNITY): Payer: Self-pay | Admitting: Emergency Medicine

## 2013-06-21 ENCOUNTER — Emergency Department (HOSPITAL_COMMUNITY)
Admission: EM | Admit: 2013-06-21 | Discharge: 2013-06-21 | Disposition: A | Discharge status: Home / Self Care | Payer: Medicaid Other | Attending: Emergency Medicine | Admitting: Emergency Medicine

## 2013-06-21 ENCOUNTER — Emergency Department (HOSPITAL_COMMUNITY): Payer: Medicaid Other

## 2013-06-21 DIAGNOSIS — G8929 Other chronic pain: Secondary | ICD-10-CM | POA: Insufficient documentation

## 2013-06-21 DIAGNOSIS — R42 Dizziness and giddiness: Secondary | ICD-10-CM | POA: Insufficient documentation

## 2013-06-21 DIAGNOSIS — F17200 Nicotine dependence, unspecified, uncomplicated: Secondary | ICD-10-CM | POA: Insufficient documentation

## 2013-06-21 DIAGNOSIS — Z7982 Long term (current) use of aspirin: Secondary | ICD-10-CM | POA: Insufficient documentation

## 2013-06-21 DIAGNOSIS — Z8673 Personal history of transient ischemic attack (TIA), and cerebral infarction without residual deficits: Secondary | ICD-10-CM | POA: Insufficient documentation

## 2013-06-21 DIAGNOSIS — R109 Unspecified abdominal pain: Secondary | ICD-10-CM | POA: Insufficient documentation

## 2013-06-21 DIAGNOSIS — K219 Gastro-esophageal reflux disease without esophagitis: Secondary | ICD-10-CM | POA: Insufficient documentation

## 2013-06-21 DIAGNOSIS — Z862 Personal history of diseases of the blood and blood-forming organs and certain disorders involving the immune mechanism: Secondary | ICD-10-CM | POA: Insufficient documentation

## 2013-06-21 DIAGNOSIS — J4489 Other specified chronic obstructive pulmonary disease: Secondary | ICD-10-CM | POA: Insufficient documentation

## 2013-06-21 DIAGNOSIS — Z79899 Other long term (current) drug therapy: Secondary | ICD-10-CM | POA: Insufficient documentation

## 2013-06-21 DIAGNOSIS — Z8659 Personal history of other mental and behavioral disorders: Secondary | ICD-10-CM | POA: Insufficient documentation

## 2013-06-21 DIAGNOSIS — M129 Arthropathy, unspecified: Secondary | ICD-10-CM | POA: Insufficient documentation

## 2013-06-21 DIAGNOSIS — R55 Syncope and collapse: Secondary | ICD-10-CM | POA: Insufficient documentation

## 2013-06-21 DIAGNOSIS — J449 Chronic obstructive pulmonary disease, unspecified: Secondary | ICD-10-CM | POA: Insufficient documentation

## 2013-06-21 DIAGNOSIS — IMO0002 Reserved for concepts with insufficient information to code with codable children: Secondary | ICD-10-CM | POA: Insufficient documentation

## 2013-06-21 DIAGNOSIS — G40909 Epilepsy, unspecified, not intractable, without status epilepticus: Secondary | ICD-10-CM | POA: Insufficient documentation

## 2013-06-21 LAB — URINALYSIS, ROUTINE W REFLEX MICROSCOPIC
BILIRUBIN URINE: NEGATIVE
Glucose, UA: NEGATIVE mg/dL
Hgb urine dipstick: NEGATIVE
Ketones, ur: NEGATIVE mg/dL
Leukocytes, UA: NEGATIVE
NITRITE: NEGATIVE
Protein, ur: NEGATIVE mg/dL
SPECIFIC GRAVITY, URINE: 1.014 (ref 1.005–1.030)
Urobilinogen, UA: 0.2 mg/dL (ref 0.0–1.0)
pH: 7.5 (ref 5.0–8.0)

## 2013-06-21 LAB — CBC
HCT: 43.7 % (ref 36.0–46.0)
HEMOGLOBIN: 14.9 g/dL (ref 12.0–15.0)
MCH: 30.2 pg (ref 26.0–34.0)
MCHC: 34.1 g/dL (ref 30.0–36.0)
MCV: 88.6 fL (ref 78.0–100.0)
Platelets: 205 10*3/uL (ref 150–400)
RBC: 4.93 MIL/uL (ref 3.87–5.11)
RDW: 13.7 % (ref 11.5–15.5)
WBC: 7.6 10*3/uL (ref 4.0–10.5)

## 2013-06-21 LAB — I-STAT CHEM 8, ED
BUN: 14 mg/dL (ref 6–23)
CHLORIDE: 97 meq/L (ref 96–112)
CREATININE: 0.8 mg/dL (ref 0.50–1.10)
Calcium, Ion: 1.14 mmol/L (ref 1.12–1.23)
Glucose, Bld: 230 mg/dL — ABNORMAL HIGH (ref 70–99)
HCT: 47 % — ABNORMAL HIGH (ref 36.0–46.0)
Hemoglobin: 16 g/dL — ABNORMAL HIGH (ref 12.0–15.0)
Potassium: 3.7 mEq/L (ref 3.7–5.3)
Sodium: 139 mEq/L (ref 137–147)
TCO2: 33 mmol/L (ref 0–100)

## 2013-06-21 LAB — I-STAT TROPONIN, ED
Troponin i, poc: 0 ng/mL (ref 0.00–0.08)
Troponin i, poc: 0 ng/mL (ref 0.00–0.08)

## 2013-06-21 LAB — I-STAT CG4 LACTIC ACID, ED: LACTIC ACID, VENOUS: 1.96 mmol/L (ref 0.5–2.2)

## 2013-06-21 MED ORDER — IOHEXOL 300 MG/ML  SOLN
100.0000 mL | Freq: Once | INTRAMUSCULAR | Status: AC | PRN
  Administered 2013-06-21: 100 mL via INTRAVENOUS

## 2013-06-21 MED ORDER — ASPIRIN 81 MG PO CHEW
324.0000 mg | CHEWABLE_TABLET | Freq: Once | ORAL | Status: AC
  Administered 2013-06-21: 324 mg via ORAL
  Filled 2013-06-21: qty 4

## 2013-06-21 MED ORDER — SODIUM CHLORIDE 0.9 % IV BOLUS (SEPSIS)
1000.0000 mL | Freq: Once | INTRAVENOUS | Status: AC
  Administered 2013-06-21: 1000 mL via INTRAVENOUS

## 2013-06-21 MED ORDER — ONDANSETRON HCL 4 MG/2ML IJ SOLN
4.0000 mg | Freq: Once | INTRAMUSCULAR | Status: AC
  Administered 2013-06-21: 4 mg via INTRAVENOUS
  Filled 2013-06-21: qty 2

## 2013-06-21 MED ORDER — NITROGLYCERIN 0.4 MG SL SUBL
0.4000 mg | SUBLINGUAL_TABLET | SUBLINGUAL | Status: DC | PRN
  Filled 2013-06-21: qty 1

## 2013-06-21 MED ORDER — IOHEXOL 300 MG/ML  SOLN
50.0000 mL | Freq: Once | INTRAMUSCULAR | Status: AC | PRN
  Administered 2013-06-21: 50 mL via ORAL

## 2013-06-21 MED ORDER — FENTANYL CITRATE 0.05 MG/ML IJ SOLN
50.0000 ug | INTRAMUSCULAR | Status: DC | PRN
  Administered 2013-06-21 (×2): 50 ug via INTRAVENOUS
  Filled 2013-06-21 (×2): qty 2

## 2013-06-21 NOTE — ED Notes (Signed)
Pt able to tolerate PO fluids.  

## 2013-06-21 NOTE — ED Notes (Signed)
Pt ambulated with one assist to the nurses station, pt sts her feet feels funny and wobble.  Pt sts she usually can walk just fine but today and tonight her feet are tingling.

## 2013-06-21 NOTE — ED Provider Notes (Signed)
CSN: [id number]     Arrival date & time 06/21/13  0052 History   First MD Initiated Contact with Patient 06/21/13 0115     Chief Complaint  Patient presents with  . Chest Pain     (Consider location/radiation/quality/duration/timing/severity/associated sxs/prior Treatment) HPI History provided by patient. At home tonight giving way from bed and had a near syncopal episode. Patient states she was standing in her kitchen at the sink, developed generalized weakness, tunnel vision and near-syncope. No LOC. She had a recent bout of ischemic colitis and has been having some lower abdominal discomfort. Pain mild in severity. No blood in stools. No point did she develop any chest pain or difficulty breathing. No headache. No unilateral weakness or numbness. No difficulty with speech, vision or gait. Symptoms moderate in severity. Patient denies history of same. No new medications. Past Medical History  Diagnosis Date  . TIA (transient ischemic attack)   . GERD (gastroesophageal reflux disease)   . Hepatomegaly     hx  . Colonic inertia   . Lung abscess     "calcified over"; bilateral   . COPD (chronic obstructive pulmonary disease)   . Stroke 2009    TIA  . PTSD (post-traumatic stress disorder)   . Bipolar disorder   . Depression   . Chronic headache   . Seizures     in past  . Fibromyalgia   . Iron deficiency anemia   . Arthritis   . Acute ischemic colitis 11/01/2012  . Marijuana abuse 2014    positive screen in hospital  . TIA (transient ischemic attack)    Past Surgical History  Procedure Laterality Date  . 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 Cesarean section      x 2  . Esophagogastroduodenoscopy  12/23/2011    Procedure: ESOPHAGOGASTRODUODENOSCOPY (EGD);  Surgeon: Hart Carwin, MD;  Location: [facility];  Service: Endoscopy;  Laterality: N/A;  . Colonoscopy    . Colonoscopy N/A 11/01/2012    Procedure: COLONOSCOPY;  Surgeon: Iva Boop, MD;  Location: [facility];  Service: Endoscopy;  Laterality: N/A;   Family History  Problem Relation Age of Onset  . Emphysema Father   . Asthma Son   . Clotting disorder Mother   . Rheum arthritis Sister   . Rheum arthritis Brother   . Ovarian cancer Sister    History  Substance Use Topics  . Smoking status: Current Every Day Smoker -- 1.00 packs/day for 39 years    Types: Cigarettes  . Smokeless tobacco: Never Used     Comment: 3-4 daily11/11/14  . Alcohol Use: No   OB History   Grav Para Term Preterm Abortions TAB SAB Ect Mult Living                 Review of Systems  Constitutional: Negative for fever and chills.  Respiratory: Negative for shortness of breath.   Cardiovascular: Negative for chest pain.  Gastrointestinal: Negative for vomiting and blood in stool.  Genitourinary: Negative for dysuria.  Musculoskeletal: Negative for back pain, neck pain and neck stiffness.  Skin: Negative for rash.  Neurological: Negative for seizures and speech difficulty.  All other systems reviewed and are negative.     Allergies  Oxycodone-acetaminophen; Codeine; Hydrocodone; and Quinolones  Home Medications   Prior to Admission medications   Medication Sig Start Date End Date Taking? Authorizing Provider  acetaminophen (TYLENOL) 500 MG  tablet Take 1,000 mg by mouth every 6 (six) hours as needed for moderate pain.   Yes Historical Provider, MD  albuterol (PROVENTIL) (2.5 MG/3ML) 0.083% nebulizer solution Take 2.5 mg by nebulization every 6 (six) hours as needed for shortness of breath. For shortness of breath   Yes Historical Provider, MD  albuterol (VENTOLIN HFA) 108 (90 BASE) MCG/ACT inhaler Inhale 2 puffs into the lungs every 6 (six) hours as needed for wheezing or shortness of breath.    Yes Historical Provider, MD  aspirin EC 81 MG tablet Take 1 tablet (81 mg total) by mouth daily. 10/14/12  Yes Costin Otelia Sergeant, MD  Cholecalciferol (VITAMIN D-3) 5000 UNITS TABS  Take 5,000 Units by mouth daily.   Yes Historical Provider, MD  cyclobenzaprine (FLEXERIL) 5 MG tablet Take 1 tablet (5 mg total) by mouth 3 (three) times daily as needed for muscle spasms. 10/14/12  Yes Costin Otelia Sergeant, MD  diazepam (VALIUM) 10 MG tablet Take 1 tablet (10 mg total) by mouth every 12 (twelve) hours as needed for anxiety. 11/02/12  Yes Dorothea Ogle, MD  dicyclomine (BENTYL) 20 MG tablet Take 1 tablet (20 mg total) by mouth 3 (three) times daily before meals. 11/02/12  Yes Dorothea Ogle, MD  fluticasone (FLONASE) 50 MCG/ACT nasal spray Place 2 sprays into both nostrils daily as needed for allergies or rhinitis.   Yes Historical Provider, MD  hydroxypropyl methylcellulose (ISOPTO TEARS) 2.5 % ophthalmic solution Place 1 drop into both eyes 3 (three) times daily as needed for dry eyes.   Yes Historical Provider, MD  ibuprofen (ADVIL,MOTRIN) 200 MG tablet Take 600 mg by mouth every 6 (six) hours as needed for fever, headache or moderate pain.    Yes Historical Provider, MD  lactose free nutrition (BOOST PLUS) LIQD Take 237 mLs by mouth 2 (two) times daily between meals. 04/03/13  Yes Nishant Dhungel, MD  lidocaine (XYLOCAINE) 5 % ointment Apply 1 application topically 2 (two) times daily as needed for mild pain.   Yes Historical Provider, MD  loratadine (CLARITIN) 10 MG tablet Take 10 mg by mouth every evening.    Yes Historical Provider, MD  mometasone-formoterol (DULERA) 100-5 MCG/ACT AERO Inhale 2 puffs into the lungs 2 (two) times daily. 02/10/13  Yes Barbaraann Share, MD  oxyCODONE (ROXICODONE) 15 MG immediate release tablet Take 1 tablet (15 mg total) by mouth every 6 (six) hours as needed for pain (back pain). 04/03/13  Yes Nishant Dhungel, MD  pantoprazole (PROTONIX) 40 MG tablet Take 1 tablet (40 mg total) by mouth daily at 6 (six) AM. 11/02/12  Yes Dorothea Ogle, MD  polyethylene glycol (MIRALAX / Ethelene Hal) packet Take 17 g by mouth daily as needed (constipation).   Yes Historical Provider,  MD  promethazine (PHENERGAN) 25 MG tablet Take 0.5-1 tablets (12.5-25 mg total) by mouth every 6 (six) hours as needed for nausea. 11/02/12  Yes Dorothea Ogle, MD  SPIRIVA HANDIHALER 18 MCG inhalation capsule inhale contents of 1 capsule by mouth once daily 12/01/12  Yes Barbaraann Share, MD  VITAMIN E PO Take 1 capsule by mouth daily.   Yes Historical Provider, MD   BP 126/69  Pulse 96  Temp(Src) 97.2 F (36.2 C) (Rectal)  Resp 19  SpO2 99%  LMP 05/29/2010 Physical Exam  Constitutional: She is oriented to person, place, and time. She appears well-developed and well-nourished.  HENT:  Head: Normocephalic and atraumatic.  Mouth/Throat: Oropharynx is clear and moist.  Dry mucous  membranes  Eyes: EOM are normal. Pupils are equal, round, and reactive to light.  Neck: Neck supple.  Cardiovascular: Normal rate, regular rhythm and intact distal pulses.   Pulmonary/Chest: Effort normal and breath sounds normal. No respiratory distress. She exhibits no tenderness.  Abdominal: Soft. Bowel sounds are normal. She exhibits no distension. There is no rebound and no guarding.  Mild suprapubic tenderness without abdominal tenderness otherwise. No CVA tenderness.  Musculoskeletal: Normal range of motion. She exhibits no edema.  Neurological: She is alert and oriented to person, place, and time. No cranial nerve deficit.  Skin: Skin is warm and dry.    ED Course  Procedures (including critical care time) Labs Review Labs Reviewed  I-STAT CHEM 8, ED - Abnormal; Notable for the following:    Glucose, Bld 230 (*)    Hemoglobin 16.0 (*)    HCT 47.0 (*)    All other components within normal limits  CBC  URINALYSIS, ROUTINE W REFLEX MICROSCOPIC  I-STAT TROPOININ, ED  I-STAT CG4 LACTIC ACID, ED  I-STAT TROPOININ, ED    Imaging Review Ct Abdomen Pelvis W Contrast  06/21/2013   CLINICAL DATA:  Chest pain in, abdominal pain, nausea, history of ischemic colitis common blurred, hepatomegaly, and colonic  inertia.  EXAM: CT ABDOMEN AND PELVIS WITH CONTRAST  TECHNIQUE: Multidetector CT imaging of the abdomen and pelvis was performed using the standard protocol following bolus administration of intravenous contrast.  CONTRAST:  100mL OMNIPAQUE IOHEXOL 300 MG/ML  SOLN  COMPARISON:  CT ABD/PELVIS W CM dated 12/30/2012  FINDINGS: Lung bases are clear.  The liver, spleen, gallbladder, pancreas, adrenal glands, kidneys, abdominal aorta, inferior vena cava, and retroperitoneal lymph nodes are unremarkable. Scattered calcified granulomas noted in the liver and spleen. The stomach, small bowel, and colon are not abnormally distended. No discrete wall thickening is appreciated. No free air or free fluid in the abdomen.  Pelvis: Uterus and ovaries are not enlarged. No abnormal pelvic mass or lymphadenopathy. Bladder wall is not thickened. Appendix is normal. No evidence of diverticulitis. No free or loculated pelvic fluid collections. No destructive bone lesions appreciated.  IMPRESSION: No acute process demonstrated in the abdomen or pelvis.   Electronically Signed   By: Burman NievesWilliam  Stevens M.D.   On: 06/21/2013 03:20   Dg Chest Portable 1 View  06/21/2013   CLINICAL DATA:  Chest pain and shortness of breath.  EXAM: PORTABLE CHEST - 1 VIEW  COMPARISON:  CT CHEST W/O CM dated 05/24/2013; DG CHEST 1V PORT dated 03/26/2013  FINDINGS: Hyperinflation. Fibrosis in the lungs with scarring in the apices. No focal airspace disease or consolidation. Calcified granuloma in the left upper lung. Normal heart size and pulmonary vascularity. No significant change since previous study.  IMPRESSION: Emphysematous changes, fibrosis, and chronic bronchitic changes. No evidence of active pulmonary disease.   Electronically Signed   By: Burman NievesWilliam  Stevens M.D.   On: 06/21/2013 01:29     EKG Interpretation   Date/Time:  Wednesday June 21 2013 01:16:45 EDT Ventricular Rate:  87 PR Interval:  161 QRS Duration: 75 QT Interval:  378 QTC  Calculation: 455 R Axis:   83 Text Interpretation:  Sinus rhythm Nonspecific ST abnormality Artifact  Abnormal ECG Confirmed by Kiowa Hollar  MD, Kalynn Declercq ([id number]) on 06/21/2013 3:50:33 AM     IV fluids provided. IV fentanyl for abdominal discomfort.  Initial blood pressure low. After IV fluids and with serial evaluations remains normotensive. Patient eating crackers and drinking water, ambulating in the ER no  acute distress. Imaging and labs and results shared with patient as above. She prefers to be discharged home and followup with her primary care physician.   MDM   Diagnosis: Near syncope  Presents after dizziness and near syncopal event at home. Isolated blood pressure in the 80s on arrival but since that time normotensive. No syncope. No chest pain or ACS symptoms otherwise. Patient did have some lower abdominal pain a CT scan was performed. No abnormalities identified. Pain improved with IV fentanyl. Patient received IV fluids and tolerates by mouth fluids. Prolonged period of observation in the emergency room. Serial troponins negative. Normal lactate. She ambulates in the emergency department and is eating a meal. I don't feel she requires admission at this point and she actually prefers to be discharged home. All vital signs and nursing notes reviewed and considered.       Sunnie Nielsen, MD 06/22/13 [phone]

## 2013-06-21 NOTE — ED Notes (Signed)
Writer provided pt a sprite and a some gram crackers.

## 2013-06-21 NOTE — ED Notes (Signed)
PT states that she stood up from the table tonight had a sudden onset of chest pain; pt describes the pain as a pulling pain to her left chest; pt states that when she stood up she felt dizzy, lightheaded, short of breath, nauseous and diaphoretic for several minutes; pt states that the symptoms have improved but are not gone; pt states "I just don't feel good"; pt states that she recently here for ischemic colitis.

## 2013-06-21 NOTE — Discharge Instructions (Signed)
Holter Monitoring A Holter monitor is a small device with electrodes (small sticky patches) that attach to your chest. It records the electrical activity of your heart and is worn continuously for 24-48 hours.  A HOLTER MONITOR IS USED TO  Detect heart problems such as:  Heart arrhythmia. Is an abnormal or irregular heartbeat. With some heart arrhythmias, you may not feel or know that you have an irregular heart rhythm.  Palpitations, such as feeling your heart racing or fluttering. It is possible to have heart palpitations and not have a heart arrhythmia.  A heart rhythm that is too slow or too fast.  If you have problems fainting, near fainting or feeling light-headed, a Holter monitor may be worn to see if your heart is the cause. HOLTER MONITOR PREPARATION   Electrodes will be attached to the skin on your chest.  If you have hair on your chest, small areas may have to be shaved. This is done to help the patches stick better and make the recording more accurate.  The electrodes are attached by wires to the Holter monitor. The Holter monitor clips to your clothing. You will wear the monitor at all times, even while exercising and sleeping. HOME CARE INSTRUCTIONS   Wear your monitor at all times.  The wires and the monitor must stay dry. Do not get the monitor wet.  Do not bathe, swim or use a hot tub with it on.  You may do a "sponge" bath while you have the monitor on.  Keep your skin clean, do not put body lotion or moisturizer on your chest.  It's possible that your skin under the electrodes could become irritated. To keep this from happening, you may put the electrodes in slightly different places on your chest.  Your caregiver will also ask you to keep a diary of your activities, such as walking or doing chores. Be sure to note what you are doing if you experience heart symptoms such as palpitations. This will help your caregiver determine what might be contributing to your  symptoms. The information stored in your monitor will be reviewed by your caregiver alongside your diary entries.  Make sure the monitor is safely clipped to your clothing or in a location close to your body that your caregiver recommends.  The monitor and electrodes are removed when the test is over. Return the monitor as directed.  Be sure to follow up with your caregiver and discuss your Holter monitor results. SEEK IMMEDIATE MEDICAL CARE IF:  You faint or feel lightheaded.  You have trouble breathing.  You get pain in your chest, upper arm or jaw.  You feel sick to your stomach and your skin is pale, cool, or damp.  You think something is wrong with the way your heart is beating. MAKE SURE YOU:   Understand these instructions.  Will watch your condition.  Will get help right away if you are not doing well or get worse. Document Released: 11/15/2003 Document Revised: 05/11/2011 Document Reviewed: 03/29/2008 [facility] Patient Information 2014 [place], [place].  Near-Syncope Near-syncope (commonly known as near fainting) is sudden weakness, dizziness, or feeling like you might pass out. During an episode of near-syncope, you may also develop pale skin, have tunnel vision, or feel sick to your stomach (nauseous). Near-syncope may occur when getting up after sitting or while standing for a long time. It is caused by a sudden decrease in blood flow to the brain. This decrease can result from various causes or triggers,  most of which are not serious. However, because near-syncope can sometimes be a sign of something serious, a medical evaluation is required. The specific cause is often not determined. HOME CARE INSTRUCTIONS  Monitor your condition for any changes. The following actions may help to alleviate any discomfort you are experiencing:  Have someone stay with you until you feel stable.  Lie down right away if you start feeling like you might faint. Breathe deeply and steadily.  Wait until all the symptoms have passed. Most of these episodes last only a few minutes. You may feel tired for several hours.   Drink enough fluids to keep your urine clear or pale yellow.   If you are taking blood pressure or heart medicine, get up slowly when seated or lying down. Take several minutes to sit and then stand. This can reduce dizziness.  Follow up with your health care provider as directed. SEEK IMMEDIATE MEDICAL CARE IF:   You have a severe headache.   You have unusual pain in the chest, abdomen, or back.   You are bleeding from the mouth or rectum, or you have black or tarry stool.   You have an irregular or very fast heartbeat.   You have repeated fainting or have seizure-like jerking during an episode.   You faint when sitting or lying down.   You have confusion.   You have difficulty walking.   You have severe weakness.   You have vision problems.  MAKE SURE YOU:   Understand these instructions.  Will watch your condition.  Will get help right away if you are not doing well or get worse. Document Released: 02/16/2005 Document Revised: 10/19/2012 Document Reviewed: 07/22/2012 [facility] Patient Information 2014 [place]

## 2013-07-17 ENCOUNTER — Telehealth: Payer: Self-pay | Admitting: Pulmonary Disease

## 2013-07-17 NOTE — Telephone Encounter (Signed)
Pt states that forms were faxed over from [facility] regarding getting the patient a Trilogy machine x 3 weeks ago.   Patient is also requesting to have a PNA vaccine and would like to know if Dr Shelle Iron will okay her to come by and get another one.   Please advise Victorino Dike if you have seen a form from [name]. Thanks.

## 2013-07-18 NOTE — Telephone Encounter (Signed)
I called [facility] and spoke with Greggory Stallion and he states that he does not have any information that the pt is supposed to have a trilogy. I spoke with [facility] as well and he states the pt does not need a tilogy. I have LMTCB to speak with the pt. Carron Curie, CMA

## 2013-07-18 NOTE — Telephone Encounter (Signed)
I spoke with the [name] and [facility] that [name]. The [name] seemed confused about it as well. I advised she needs to come in for an OV to discuss any needs with [facility]. [name] states she will call back to set an appt. Carron CurieJennifer Demetries Coia, CMA

## 2013-07-25 ENCOUNTER — Other Ambulatory Visit: Payer: Self-pay | Admitting: Pulmonary Disease

## 2013-12-12 ENCOUNTER — Telehealth: Payer: Self-pay | Admitting: Pulmonary Disease

## 2013-12-12 NOTE — Telephone Encounter (Signed)
I spoke with the pt and she states over the last 3 days she has been having increased SOB with minimal activity, not at rest. She states she was also having sinus congestion so she went to her PCP and they gave her an abx. Pt states that she is still having increased SOB and is needing her oxygen more. Pt last seen almost 1 year ago. Appt set for tomorrow. I advised the pt that is her symptoms get worse, saturations do not stay up with oxygen use, SOB with rest, then to go to the ER. Pt states understanding. Carron CurieJennifer Felise [place], CMA

## 2013-12-13 ENCOUNTER — Encounter (HOSPITAL_COMMUNITY): Payer: Self-pay | Admitting: Emergency Medicine

## 2013-12-13 ENCOUNTER — Telehealth: Payer: Self-pay | Admitting: Pulmonary Disease

## 2013-12-13 ENCOUNTER — Ambulatory Visit: Payer: Self-pay | Admitting: Internal Medicine

## 2013-12-13 ENCOUNTER — Emergency Department (HOSPITAL_COMMUNITY): Payer: Medicaid Other

## 2013-12-13 ENCOUNTER — Inpatient Hospital Stay (HOSPITAL_COMMUNITY)
Admission: EM | Admit: 2013-12-13 | Discharge: 2013-12-20 | DRG: 190 | Disposition: A | Discharge status: Home Health | Payer: Medicaid Other | Attending: Internal Medicine | Admitting: Internal Medicine

## 2013-12-13 DIAGNOSIS — Z8261 Family history of arthritis: Secondary | ICD-10-CM | POA: Diagnosis not present

## 2013-12-13 DIAGNOSIS — Z832 Family history of diseases of the blood and blood-forming organs and certain disorders involving the immune mechanism: Secondary | ICD-10-CM

## 2013-12-13 DIAGNOSIS — G8929 Other chronic pain: Secondary | ICD-10-CM

## 2013-12-13 DIAGNOSIS — E876 Hypokalemia: Secondary | ICD-10-CM | POA: Diagnosis present

## 2013-12-13 DIAGNOSIS — J9602 Acute respiratory failure with hypercapnia: Secondary | ICD-10-CM

## 2013-12-13 DIAGNOSIS — E872 Acidosis: Secondary | ICD-10-CM | POA: Diagnosis present

## 2013-12-13 DIAGNOSIS — Z8673 Personal history of transient ischemic attack (TIA), and cerebral infarction without residual deficits: Secondary | ICD-10-CM | POA: Diagnosis not present

## 2013-12-13 DIAGNOSIS — Z885 Allergy status to narcotic agent status: Secondary | ICD-10-CM | POA: Diagnosis not present

## 2013-12-13 DIAGNOSIS — J9621 Acute and chronic respiratory failure with hypoxia: Secondary | ICD-10-CM

## 2013-12-13 DIAGNOSIS — E441 Mild protein-calorie malnutrition: Secondary | ICD-10-CM | POA: Diagnosis present

## 2013-12-13 DIAGNOSIS — Z8041 Family history of malignant neoplasm of ovary: Secondary | ICD-10-CM | POA: Diagnosis not present

## 2013-12-13 DIAGNOSIS — J9601 Acute respiratory failure with hypoxia: Secondary | ICD-10-CM | POA: Diagnosis present

## 2013-12-13 DIAGNOSIS — J209 Acute bronchitis, unspecified: Secondary | ICD-10-CM | POA: Diagnosis present

## 2013-12-13 DIAGNOSIS — K219 Gastro-esophageal reflux disease without esophagitis: Secondary | ICD-10-CM | POA: Diagnosis present

## 2013-12-13 DIAGNOSIS — Z881 Allergy status to other antibiotic agents status: Secondary | ICD-10-CM

## 2013-12-13 DIAGNOSIS — Z7982 Long term (current) use of aspirin: Secondary | ICD-10-CM | POA: Diagnosis not present

## 2013-12-13 DIAGNOSIS — J852 Abscess of lung without pneumonia: Secondary | ICD-10-CM | POA: Diagnosis present

## 2013-12-13 DIAGNOSIS — M797 Fibromyalgia: Secondary | ICD-10-CM | POA: Diagnosis present

## 2013-12-13 DIAGNOSIS — F4310 Post-traumatic stress disorder, unspecified: Secondary | ICD-10-CM | POA: Diagnosis present

## 2013-12-13 DIAGNOSIS — R110 Nausea: Secondary | ICD-10-CM

## 2013-12-13 DIAGNOSIS — F17210 Nicotine dependence, cigarettes, uncomplicated: Secondary | ICD-10-CM | POA: Diagnosis present

## 2013-12-13 DIAGNOSIS — K55039 Acute (reversible) ischemia of large intestine, extent unspecified: Secondary | ICD-10-CM

## 2013-12-13 DIAGNOSIS — E871 Hypo-osmolality and hyponatremia: Secondary | ICD-10-CM

## 2013-12-13 DIAGNOSIS — Z681 Body mass index (BMI) 19 or less, adult: Secondary | ICD-10-CM

## 2013-12-13 DIAGNOSIS — J441 Chronic obstructive pulmonary disease with (acute) exacerbation: Principal | ICD-10-CM | POA: Diagnosis present

## 2013-12-13 DIAGNOSIS — E860 Dehydration: Secondary | ICD-10-CM | POA: Diagnosis present

## 2013-12-13 DIAGNOSIS — R0602 Shortness of breath: Secondary | ICD-10-CM | POA: Diagnosis present

## 2013-12-13 DIAGNOSIS — M1990 Unspecified osteoarthritis, unspecified site: Secondary | ICD-10-CM | POA: Diagnosis present

## 2013-12-13 DIAGNOSIS — Z720 Tobacco use: Secondary | ICD-10-CM

## 2013-12-13 DIAGNOSIS — R1110 Vomiting, unspecified: Secondary | ICD-10-CM

## 2013-12-13 DIAGNOSIS — Z825 Family history of asthma and other chronic lower respiratory diseases: Secondary | ICD-10-CM

## 2013-12-13 DIAGNOSIS — E43 Unspecified severe protein-calorie malnutrition: Secondary | ICD-10-CM

## 2013-12-13 DIAGNOSIS — R1013 Epigastric pain: Secondary | ICD-10-CM

## 2013-12-13 DIAGNOSIS — F3130 Bipolar disorder, current episode depressed, mild or moderate severity, unspecified: Secondary | ICD-10-CM | POA: Diagnosis present

## 2013-12-13 DIAGNOSIS — J9612 Chronic respiratory failure with hypercapnia: Secondary | ICD-10-CM | POA: Diagnosis present

## 2013-12-13 DIAGNOSIS — R109 Unspecified abdominal pain: Secondary | ICD-10-CM

## 2013-12-13 DIAGNOSIS — F411 Generalized anxiety disorder: Secondary | ICD-10-CM | POA: Diagnosis present

## 2013-12-13 DIAGNOSIS — R1310 Dysphagia, unspecified: Secondary | ICD-10-CM

## 2013-12-13 DIAGNOSIS — E440 Moderate protein-calorie malnutrition: Secondary | ICD-10-CM

## 2013-12-13 DIAGNOSIS — J208 Acute bronchitis due to other specified organisms: Secondary | ICD-10-CM

## 2013-12-13 DIAGNOSIS — K921 Melena: Secondary | ICD-10-CM

## 2013-12-13 DIAGNOSIS — R509 Fever, unspecified: Secondary | ICD-10-CM

## 2013-12-13 DIAGNOSIS — K5909 Other constipation: Secondary | ICD-10-CM

## 2013-12-13 DIAGNOSIS — K529 Noninfective gastroenteritis and colitis, unspecified: Secondary | ICD-10-CM

## 2013-12-13 DIAGNOSIS — Z79899 Other long term (current) drug therapy: Secondary | ICD-10-CM | POA: Diagnosis not present

## 2013-12-13 LAB — CBC
HEMATOCRIT: 49.2 % — AB (ref 36.0–46.0)
Hemoglobin: 16.2 g/dL — ABNORMAL HIGH (ref 12.0–15.0)
MCH: 29.9 pg (ref 26.0–34.0)
MCHC: 32.9 g/dL (ref 30.0–36.0)
MCV: 90.9 fL (ref 78.0–100.0)
Platelets: 252 10*3/uL (ref 150–400)
RBC: 5.41 MIL/uL — ABNORMAL HIGH (ref 3.87–5.11)
RDW: 14.5 % (ref 11.5–15.5)
WBC: 9.1 10*3/uL (ref 4.0–10.5)

## 2013-12-13 LAB — BASIC METABOLIC PANEL
ANION GAP: 12 (ref 5–15)
BUN: 5 mg/dL — ABNORMAL LOW (ref 6–23)
CO2: 35 meq/L — AB (ref 19–32)
CREATININE: 0.41 mg/dL — AB (ref 0.50–1.10)
Calcium: 10 mg/dL (ref 8.4–10.5)
Chloride: 91 mEq/L — ABNORMAL LOW (ref 96–112)
GFR calc Af Amer: >90 mL/min (ref >90)
GFR calc non Af Amer: >90 mL/min (ref >90)
Glucose, Bld: 133 mg/dL — ABNORMAL HIGH (ref 70–99)
Potassium: 4.6 mEq/L (ref 3.7–5.3)
Sodium: 138 mEq/L (ref 137–147)

## 2013-12-13 LAB — RESPIRATORY VIRUS PANEL
Adenovirus: NOT DETECTED
INFLUENZA A: NOT DETECTED
INFLUENZA B 1: NOT DETECTED
Influenza A H1: NOT DETECTED
Influenza A H3: NOT DETECTED
Metapneumovirus: NOT DETECTED
PARAINFLUENZA 1 A: NOT DETECTED
PARAINFLUENZA 2 A: NOT DETECTED
PARAINFLUENZA 3 A: NOT DETECTED
RESPIRATORY SYNCYTIAL VIRUS B: NOT DETECTED
Respiratory Syncytial Virus A: NOT DETECTED
Rhinovirus: NOT DETECTED

## 2013-12-13 LAB — BLOOD GAS, ARTERIAL
Acid-Base Excess: 6.6 mmol/L — ABNORMAL HIGH (ref 0.0–2.0)
Acid-Base Excess: 5.8 mmol/L — ABNORMAL HIGH (ref 0.0–2.0)
Bicarbonate: 35.7 mEq/L — ABNORMAL HIGH (ref 20.0–24.0)
Bicarbonate: 35 mEq/L — ABNORMAL HIGH (ref 20.0–24.0)
DELIVERY SYSTEMS: POSITIVE
Drawn by: 232811
Drawn by: 232811
Expiratory PAP: 6
FIO2: 0.45 %
Inspiratory PAP: 12
MODE: POSITIVE
O2 Content: 3 L/min
O2 SAT: 98.3 %
O2 Saturation: 91.3 %
PATIENT TEMPERATURE: 97.8
PCO2 ART: 71.3 mmHg — AB (ref 35.0–45.0)
PO2 ART: 61 mmHg — AB (ref 80.0–100.0)
Patient temperature: 97.8
RATE: 12 resp/min
TCO2: 31.5 mmol/L (ref 0–100)
TCO2: 31 mmol/L (ref 0–100)
pCO2 arterial: 72.5 mmHg (ref 35.0–45.0)
pH, Arterial: 7.318 — ABNORMAL LOW (ref 7.350–7.450)
pH, Arterial: 7.303 — ABNORMAL LOW (ref 7.350–7.450)
pO2, Arterial: 131 mmHg — ABNORMAL HIGH (ref 80.0–100.0)

## 2013-12-13 LAB — PREALBUMIN: PREALBUMIN: 21 mg/dL (ref 17.0–34.0)

## 2013-12-13 LAB — MRSA PCR SCREENING: MRSA BY PCR: NEGATIVE

## 2013-12-13 LAB — I-STAT TROPONIN, ED: Troponin i, poc: 0 ng/mL (ref 0.00–0.08)

## 2013-12-13 MED ORDER — IPRATROPIUM-ALBUTEROL 0.5-2.5 (3) MG/3ML IN SOLN
3.0000 mL | RESPIRATORY_TRACT | Status: DC
  Administered 2013-12-13 (×3): 3 mL via RESPIRATORY_TRACT
  Filled 2013-12-13 (×3): qty 3

## 2013-12-13 MED ORDER — METHYLPREDNISOLONE SODIUM SUCC 125 MG IJ SOLR
125.0000 mg | Freq: Four times a day (QID) | INTRAMUSCULAR | Status: DC
  Administered 2013-12-13: 125 mg via INTRAVENOUS
  Filled 2013-12-13: qty 2

## 2013-12-13 MED ORDER — FENTANYL CITRATE 0.05 MG/ML IJ SOLN
12.5000 ug | Freq: Once | INTRAMUSCULAR | Status: AC
  Administered 2013-12-13: 12.5 ug via INTRAVENOUS
  Filled 2013-12-13: qty 2

## 2013-12-13 MED ORDER — SODIUM CHLORIDE 0.9 % IV SOLN
INTRAVENOUS | Status: DC
  Administered 2013-12-13: 50 mL/h via INTRAVENOUS
  Administered 2013-12-18: 12:00:00 via INTRAVENOUS

## 2013-12-13 MED ORDER — PANTOPRAZOLE SODIUM 40 MG IV SOLR
40.0000 mg | Freq: Once | INTRAVENOUS | Status: AC
  Administered 2013-12-13: 40 mg via INTRAVENOUS
  Filled 2013-12-13: qty 40

## 2013-12-13 MED ORDER — HYDROCODONE-ACETAMINOPHEN 5-325 MG PO TABS
1.0000 | ORAL_TABLET | Freq: Four times a day (QID) | ORAL | Status: DC | PRN
  Filled 2013-12-13: qty 1

## 2013-12-13 MED ORDER — ONDANSETRON HCL 4 MG/2ML IJ SOLN
4.0000 mg | Freq: Once | INTRAMUSCULAR | Status: AC
  Administered 2013-12-13: 4 mg via INTRAVENOUS
  Filled 2013-12-13: qty 2

## 2013-12-13 MED ORDER — OXYCODONE HCL 5 MG PO TABS: 15.0000 mg | ORAL_TABLET | Freq: Four times a day (QID) | ORAL | Status: DC | PRN

## 2013-12-13 MED ORDER — METHYLPREDNISOLONE SODIUM SUCC 125 MG IJ SOLR
40.0000 mg | Freq: Two times a day (BID) | INTRAMUSCULAR | Status: DC
  Administered 2013-12-13 – 2013-12-16 (×6): 40 mg via INTRAVENOUS
  Filled 2013-12-13 (×4): qty 2
  Filled 2013-12-13: qty 0.64
  Filled 2013-12-13 (×2): qty 2

## 2013-12-13 MED ORDER — CHLORHEXIDINE GLUCONATE 0.12 % MT SOLN
15.0000 mL | Freq: Two times a day (BID) | OROMUCOSAL | Status: DC
  Administered 2013-12-13 – 2013-12-20 (×15): 15 mL via OROMUCOSAL
  Filled 2013-12-13 (×16): qty 15

## 2013-12-13 MED ORDER — IPRATROPIUM-ALBUTEROL 0.5-2.5 (3) MG/3ML IN SOLN
3.0000 mL | RESPIRATORY_TRACT | Status: DC
  Administered 2013-12-13 – 2013-12-15 (×11): 3 mL via RESPIRATORY_TRACT
  Filled 2013-12-13 (×12): qty 3

## 2013-12-13 MED ORDER — DOXYCYCLINE HYCLATE 100 MG IV SOLR
100.0000 mg | Freq: Two times a day (BID) | INTRAVENOUS | Status: DC
  Administered 2013-12-13 – 2013-12-15 (×5): 100 mg via INTRAVENOUS
  Filled 2013-12-13 (×5): qty 100

## 2013-12-13 MED ORDER — HYDROMORPHONE HCL 1 MG/ML IJ SOLN
0.5000 mg | Freq: Once | INTRAMUSCULAR | Status: AC
  Administered 2013-12-13: 0.5 mg via INTRAVENOUS
  Filled 2013-12-13: qty 1

## 2013-12-13 MED ORDER — LORAZEPAM 2 MG/ML IJ SOLN
0.5000 mg | Freq: Once | INTRAMUSCULAR | Status: AC
  Administered 2013-12-13: 0.5 mg via INTRAVENOUS
  Filled 2013-12-13: qty 1

## 2013-12-13 MED ORDER — IPRATROPIUM-ALBUTEROL 0.5-2.5 (3) MG/3ML IN SOLN: 3.0000 mL | RESPIRATORY_TRACT | Status: DC | PRN

## 2013-12-13 MED ORDER — TRAMADOL HCL 50 MG PO TABS
50.0000 mg | ORAL_TABLET | Freq: Four times a day (QID) | ORAL | Status: DC | PRN
  Administered 2013-12-13 – 2013-12-17 (×3): 50 mg via ORAL
  Filled 2013-12-13 (×3): qty 1

## 2013-12-13 MED ORDER — BOOST PLUS PO LIQD
237.0000 mL | Freq: Three times a day (TID) | ORAL | Status: DC
  Administered 2013-12-13 – 2013-12-20 (×18): 237 mL via ORAL
  Filled 2013-12-13 (×24): qty 237

## 2013-12-13 MED ORDER — ALBUTEROL (5 MG/ML) CONTINUOUS INHALATION SOLN
10.0000 mg/h | INHALATION_SOLUTION | Freq: Once | RESPIRATORY_TRACT | Status: AC
  Administered 2013-12-13: 10 mg/h via RESPIRATORY_TRACT
  Filled 2013-12-13: qty 20

## 2013-12-13 MED ORDER — IPRATROPIUM-ALBUTEROL 0.5-2.5 (3) MG/3ML IN SOLN
3.0000 mL | RESPIRATORY_TRACT | Status: DC | PRN
  Administered 2013-12-16: 3 mL via RESPIRATORY_TRACT
  Filled 2013-12-13: qty 3

## 2013-12-13 MED ORDER — METHYLPREDNISOLONE SODIUM SUCC 125 MG IJ SOLR
125.0000 mg | Freq: Once | INTRAMUSCULAR | Status: AC
  Administered 2013-12-13: 125 mg via INTRAVENOUS
  Filled 2013-12-13: qty 2

## 2013-12-13 MED ORDER — CETYLPYRIDINIUM CHLORIDE 0.05 % MT LIQD
7.0000 mL | Freq: Two times a day (BID) | OROMUCOSAL | Status: DC
  Administered 2013-12-13 – 2013-12-20 (×15): 7 mL via OROMUCOSAL

## 2013-12-13 MED ORDER — LORAZEPAM 2 MG/ML IJ SOLN
1.0000 mg | INTRAMUSCULAR | Status: DC | PRN
  Administered 2013-12-13: 1 mg via INTRAVENOUS
  Administered 2013-12-13: 2 mg via INTRAVENOUS
  Administered 2013-12-13: 1 mg via INTRAVENOUS
  Administered 2013-12-14 – 2013-12-17 (×11): 2 mg via INTRAVENOUS
  Administered 2013-12-18: 1 mg via INTRAVENOUS
  Filled 2013-12-13 (×16): qty 1

## 2013-12-13 MED ORDER — SODIUM CHLORIDE 0.9 % IJ SOLN
3.0000 mL | Freq: Two times a day (BID) | INTRAMUSCULAR | Status: DC
  Administered 2013-12-13 – 2013-12-20 (×11): 3 mL via INTRAVENOUS

## 2013-12-13 MED ORDER — HEPARIN SODIUM (PORCINE) 5000 UNIT/ML IJ SOLN
5000.0000 [IU] | Freq: Three times a day (TID) | INTRAMUSCULAR | Status: DC
  Administered 2013-12-13 – 2013-12-17 (×13): 5000 [IU] via SUBCUTANEOUS
  Filled 2013-12-13 (×18): qty 1

## 2013-12-13 NOTE — ED Notes (Signed)
Pt given ice pack for headache per her request,  Pt states she is not feeling any better

## 2013-12-13 NOTE — H&P (Signed)
Hospitalist Admission History and Physical  Patient name: [name] Medical record number: [id number] Date of birth: May 08, 1960 Age: 53 y.o. Gender: female  Primary Care Provider: Kaleen MaskELKINS,WILSON OLIVER, MD  Chief Complaint: acute resp failure with hypoxia and hypercapnia, COPD exacerbation   History of Present Illness:This is a 53 y.o. year old female with significant past medical history of COPD, chronic resp failure-no longer on continuous O2, tobacco abuse, bipolar depression, hx/o TIA  presenting with acute resp failure with hypoxia and hypercapnia. Pt noted to have had acute resp failure w/ hypoxia 2/2 human metapneumovirus that required intubation 03/2013. Was initially on home 02 chronically. Now only prn. Pt reports developing nasal congestion and sinus pressure approx 1 week ago. Was started on augmentin for sinusitis coverage. Pt states that resp sxs seeemed to worsen despite abx. Still smoking daily. + wheez, cough, sputum production. ? Chills at home. Nightly O2 use over past 4-5 days. No reported CP. Increased albuterol use at home with no improvement in sxs.  Presented to ER HR 90s-110s, Resp 10s-20s, BP 110s-160s, Satting in mid 80s on RA, improved to > 96% on BIPAP. ABG shows a decompensated chronic resp acidosis (7.32, 71, 35-pO2 61). WBC 9.1, hgb 16.2, Cr 0.4, bicarb 35. CXR WNL. GIven IV solumedrol, albuterol and BiPAP in ED.   Assessment and Plan: Bethany SenegalCarla M Strey is a 53 y.o. year old female presenting with acute resp failure with hypoxia and hypercapnia   Active Problems:   Acute respiratory failure with hypoxia and hypercapnia   1- Acute resp failure with hypoxia and hypercapnia/COPD -? URI/viral induced  -Cont BiPAP  -IV solumedrol  -IV doxy (fluorquinolone allergy) -scheduled nebs -step down bed  -resp virus panel  -Quit smoking! -NPO until resp distress improves  -repeat ABG   2- Anxiety/depression -mood stable  -prn ativan until orals can be taken   3-  protein calorie malnutrition, severe -check prealbumin  -restart boost when able   FEN/GI: NPO for now. IV PPI x1. Reassess po intake by am team  Prophylaxis: sub q heparin  Disposition: pending further evaluaton  Code Status:Full Code    Patient Active Problem List   Diagnosis Date Noted  . Acute respiratory failure with hypoxia and hypercapnia 12/13/2013  . Generalized anxiety disorder 04/03/2013  . Tobacco abuse 04/03/2013  . Acute bronchitis due to human metapneumovirus 03/25/2013  . Acute on chronic respiratory failure 03/25/2013  . Febrile illness, acute 03/22/2013  . COPD exacerbation 03/22/2013  . Hyponatremia 03/22/2013  . Dehydration 03/22/2013  . Acute ischemic colitis 11/01/2012  . Protein-calorie malnutrition, severe 10/31/2012  . Hematochezia 10/31/2012  . Abdominal pain 10/30/2012  . Colitis 10/30/2012  . Chronic abdominal pain 10/14/2012  . Chronic constipation 10/14/2012  . Chronic nausea 10/14/2012  . Nausea with vomiting 12/23/2011  . Epigastric pain 10/27/2011  . Nausea 10/27/2011  . Dysphagia 10/27/2011  . BIPOLAR DISORDER UNSPECIFIED 04/18/2010  . HEPATOMEGALY, HX OF 04/18/2010  . ANEMIA-IRON DEFICIENCY 11/20/2009  . GERD 11/20/2009  . POST TRAUMATIC STRESS SYNDROME 09/24/2008  . TIA 09/24/2008  . C O P D 09/24/2008  . HEADACHE, CHRONIC 09/24/2008  . ANGINA, HX OF 09/24/2008  . CT, CHEST, ABNORMAL 09/24/2008   Past Medical History: Past Medical History  Diagnosis Date  . TIA (transient ischemic attack)   . GERD (gastroesophageal reflux disease)   . Hepatomegaly     hx  . Colonic inertia   . Lung abscess     "calcified over"; bilateral   .  COPD (chronic obstructive pulmonary disease)   . Stroke 2009    TIA  . PTSD (post-traumatic stress disorder)   . Bipolar disorder   . Depression   . Chronic headache   . Seizures     in past  . Fibromyalgia   . Iron deficiency anemia   . Arthritis   . Acute ischemic colitis 11/01/2012  .  Marijuana abuse 2014    positive screen in hospital  . TIA (transient ischemic attack)     Past Surgical History: Past Surgical History  Procedure Laterality Date  . 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 Cesarean section      x 2  . Esophagogastroduodenoscopy  12/23/2011    Procedure: ESOPHAGOGASTRODUODENOSCOPY (EGD);  Surgeon: Hart Carwin, MD;  Location: [facility];  Service: Endoscopy;  Laterality: N/A;  . Colonoscopy    . Colonoscopy N/A 11/01/2012    Procedure: COLONOSCOPY;  Surgeon: Iva Boop, MD;  Location: [facility];  Service: Endoscopy;  Laterality: N/A;    Social History: History   Social History  . Marital Status: Married    Spouse Name: N/A    Number of Children: 4  . Years of Education: N/A   Occupational History  . 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   Social History Main Topics  . Smoking status: Current Every Day Smoker -- 1.00 packs/day for 39 years    Types: Cigarettes  . Smokeless tobacco: Never Used     Comment: 3-4 daily11/11/14  . Alcohol Use: No  . Drug Use: No     Comment: hx cannibus abuse  . Sexual Activity: No   Other Topics Concern  . None   Social History Narrative   Pt is divorced.   Pt has 4 sons: 2 alive and 2 deceased.   Hx of cannabis abuse    Family History: Family History  Problem Relation Age of Onset  . Emphysema Father   . Asthma Son   . Clotting disorder Mother   . Rheum arthritis Sister   . Rheum arthritis Brother   . Ovarian cancer Sister     Allergies: Allergies  Allergen Reactions  . Oxycodone-Acetaminophen Anaphylaxis, Itching and Nausea And Vomiting    Tylox (Pt states, "I can only take Demerol, Oxycontin, and Dilaudid")  . Codeine Nausea And Vomiting  . Hydrocodone Nausea And Vomiting  . Quinolones Hives    Current Facility-Administered Medications  Medication Dose Route Frequency Provider Last Rate Last Dose  . 0.9 %  sodium chloride  infusion   Intravenous Continuous Doree Albee, MD      . heparin injection 5,000 Units  5,000 Units Subcutaneous 3 times per day Doree Albee, MD      . ipratropium-albuterol (DUONEB) 0.5-2.5 (3) MG/3ML nebulizer solution 3 mL  3 mL Nebulization Q2H Doree Albee, MD      . ipratropium-albuterol (DUONEB) 0.5-2.5 (3) MG/3ML nebulizer solution 3 mL  3 mL Nebulization Q1H PRN Doree Albee, MD      . methylPREDNISolone sodium succinate (SOLU-MEDROL) 125 mg/2 mL injection 125 mg  125 mg Intravenous 4 times per day Doree Albee, MD      . sodium chloride 0.9 % injection 3 mL  3 mL Intravenous Q12H Doree Albee, MD       Current Outpatient Prescriptions  Medication Sig Dispense Refill  . acetaminophen (TYLENOL) 500 MG tablet Take 1,000 mg  by mouth every 6 (six) hours as needed for moderate pain.      Marland Kitchen albuterol (PROVENTIL) (2.5 MG/3ML) 0.083% nebulizer solution Take 2.5 mg by nebulization every 6 (six) hours as needed for shortness of breath. For shortness of breath      . albuterol (VENTOLIN HFA) 108 (90 BASE) MCG/ACT inhaler Inhale 2 puffs into the lungs every 6 (six) hours as needed for wheezing or shortness of breath.       Marland Kitchen aspirin EC 81 MG tablet Take 1 tablet (81 mg total) by mouth daily.  30 tablet  2  . Cholecalciferol (VITAMIN D-3) 5000 UNITS TABS Take 5,000 Units by mouth daily.      . cyclobenzaprine (FLEXERIL) 5 MG tablet Take 1 tablet (5 mg total) by mouth 3 (three) times daily as needed for muscle spasms.  6 tablet  0  . diazepam (VALIUM) 10 MG tablet Take 1 tablet (10 mg total) by mouth every 12 (twelve) hours as needed for anxiety.  14 tablet  0  . dicyclomine (BENTYL) 20 MG tablet Take 1 tablet (20 mg total) by mouth 3 (three) times daily before meals.  90 tablet  1  . DULERA 100-5 MCG/ACT AERO inhale 2 puffs by mouth twice a day  13 g  5  . fluticasone (FLONASE) 50 MCG/ACT nasal spray Place 2 sprays into both nostrils daily as needed for allergies or rhinitis.      .  hydroxypropyl methylcellulose (ISOPTO TEARS) 2.5 % ophthalmic solution Place 1 drop into both eyes 3 (three) times daily as needed for dry eyes.      Marland Kitchen lactose free nutrition (BOOST PLUS) LIQD Take 237 mLs by mouth 2 (two) times daily between meals.  60 Can  0  . lidocaine (XYLOCAINE) 5 % ointment Apply 1 application topically 2 (two) times daily as needed for mild pain.      Marland Kitchen loratadine (CLARITIN) 10 MG tablet Take 10 mg by mouth every evening.       Marland Kitchen oxyCODONE (ROXICODONE) 15 MG immediate release tablet Take 1 tablet (15 mg total) by mouth every 6 (six) hours as needed for pain (back pain).  20 tablet  0  . pantoprazole (PROTONIX) 40 MG tablet Take 1 tablet (40 mg total) by mouth daily at 6 (six) AM.  30 tablet  1  . polyethylene glycol (MIRALAX / GLYCOLAX) packet Take 17 g by mouth daily as needed (constipation).      . promethazine (PHENERGAN) 25 MG tablet Take 0.5-1 tablets (12.5-25 mg total) by mouth every 6 (six) hours as needed for nausea.  30 tablet  0  . SPIRIVA HANDIHALER 18 MCG inhalation capsule inhale contents of 1 capsule by mouth once daily  30 capsule  6  . VITAMIN E PO Take 1 capsule by mouth daily.       Review Of Systems: 12 point ROS negative except as noted above in HPI.  Physical Exam: Filed Vitals:   12/13/13 0430  BP: 118/67  Pulse: 96  Resp: 25    General: cooperative and cachectic HEENT: PERRLA and dry oral mucosa  Heart: S1, S2 normal, no murmur, rub or gallop, regular rate and rhythm Lungs: expiratory wheezes and mildly labored breathing  Abdomen: abdomen is soft without significant tenderness, masses, organomegaly or guarding Extremities: extremities normal, atraumatic, no cyanosis or edema Skin:no rashes Neurology: normal without focal findings  Labs and Imaging: Lab Results  Component Value Date/Time   NA 138 12/13/2013  1:30 AM  K 4.6 12/13/2013  1:30 AM   CL 91* 12/13/2013  1:30 AM   CO2 35* 12/13/2013  1:30 AM   BUN 5* 12/13/2013  1:30 AM    CREATININE 0.41* 12/13/2013  1:30 AM   GLUCOSE 133* 12/13/2013  1:30 AM   Lab Results  Component Value Date   WBC 9.1 12/13/2013   HGB 16.2* 12/13/2013   HCT 49.2* 12/13/2013   MCV 90.9 12/13/2013   PLT 252 12/13/2013    Dg Chest Port 1 View  12/13/2013   CLINICAL DATA:  COPD, shortness of breath, sinus infection  EXAM: PORTABLE CHEST - 1 VIEW  COMPARISON:  06/21/2013; 03/26/2013; chest CT- 05/24/2013  FINDINGS: Grossly unchanged cardiac silhouette and mediastinal contours the lungs remain hyperexpanded. Unchanged punctate granuloma within the left upper lung. Linear heterogeneous opacities within the right upper lung are unchanged and favored to represent subsegmental atelectasis. No new focal airspace opacities. No pleural effusion or pneumothorax. No evidence of edema. No acute osseus abnormalities.  IMPRESSION: Hyperexpanded lungs without acute cardiopulmonary disease.   Electronically Signed   By: Simonne Come M.D.   On: 12/13/2013 02:54           Doree Albee MD  Pager: [phone]

## 2013-12-13 NOTE — Plan of Care (Signed)
Pt wants home pain med restarted. Allergy list reviewed and Percocet listed as an allergy which causes n/v, anaphylaxis. However, pharmacy called and verified that pt has a Rx for Oxy IR at [facility] and this is on her PTA med list. RN verified with pt that she doesn't have an allergy to Oxycodone. This NP tried to remove this med from allergy list, but am unable to do this at this time. Will restart home med and avoid IV narcotics after one dose that was already ordered.

## 2013-12-13 NOTE — Progress Notes (Signed)
Pt placed on bipap after meds given by RN for nausea and anxiety.  Pt on 12/6 45%.  HR111, spo2 93%.  Pt tolerating well at this time.  RN aware.

## 2013-12-13 NOTE — Telephone Encounter (Signed)
Has appt 10/14 with KC Was not feeling well, in [place] ER -awaiting eval

## 2013-12-13 NOTE — Progress Notes (Signed)
Pt currently on 55% VM and tolerating well at this time, PT in no distress.  BIPAP not needed at this time, RT to monitor and assess as needed.

## 2013-12-13 NOTE — ED Notes (Addendum)
Pt states she has COPD. Has had a sinus infection for which she is being treated with amoxicillin. States she has had increased difficulty breathing and has had to use her PRN oxygen more. RA 83%. Alert and oriented. Speaking in fragmented sentences.

## 2013-12-13 NOTE — Progress Notes (Signed)
Pt in respiratory distress, unable to do peak flow at this time. 

## 2013-12-13 NOTE — Progress Notes (Signed)
Pt transported from ED to ICU on 45%vm without incident.  O2 sats remained >92% during transport.  Pt will be placed back on bipap by the RT receiving the pt in the ICU.

## 2013-12-13 NOTE — Progress Notes (Signed)
PT does not appear to be in respiratory distress at this time- PT confirms her breathing is "calmer." No need for BiPAP at this time.

## 2013-12-13 NOTE — Progress Notes (Signed)
Patient ID: Bethany SenegalCarla M Bartle, female   DOB: [birth year], 53 y.o.   MRN: [id number]  [facility] PROGRESS NOTE  Bethany SenegalCarla M Dirr WUJ:[id number] DOB: [birth year] DOA: 12/13/2013 PCP: Kaleen MaskELKINS,WILSON OLIVER, MD  Brief narrative: 53 y.o. year old female with COPD, chronic resp failure-no longer on continuous O2, tobacco abuse, VDRF in January 2015 secondary to metapneumovirus, bipolar depression, hx/o TIA, recently treated with Augmentin for sinusitis, presented with dyspnea, productive cough of yellow sputum, fevers. Upon initial evaluation in ED, found to be in acute hypoxic respiratory failure with oxygen saturation in 80's on RA, requiring BiPAP and with subsequent improvement in symptoms. Pt given solumedrol and BDs in ED.   Assessment/Plan:    Active Problems:   Acute respiratory failure with hypoxia and hypercapnia - clinically improving this AM, maintaining oxygen saturations at target range  - she is still on BIPAP, will transition to venti mask - continue solumedrol, pt is currently on 125 mg Q6, will decrease the regimen to 40 mg Q12 hours - continue BD's scheduled and as needed - keep in SDU - continue doxycycline    Dehydration - evident by physical exam mild degree of hemoconcentration - continue IVF, advance diet and repeat CBC in AM  DVT Prophylaxis Heparin SQ  Code Status: Full.  Family Communication:  plan of care discussed with the patient Disposition Plan: Home when stable.   IV Access:   Peripheral IV Procedures and diagnostic studies:    CXR 12/13/2013   Hyperexpanded lungs without acute cardiopulmonary disease.    Medical Consultants:   None Other Consultants:   None  Anti-Infectives:   Doxycycline 10/14 -->  Manson PasseyEVINE, Shade Kaley, MD  [facility] Pager [phone]  If 7PM-7AM, please contact night-coverage [email or web address] Password [email or web address] 12/13/2013, 10:24 AM   LOS: 0 days   HPI/Subjective: No acute overnight events.  Objective: Filed Vitals:   12/13/13  0749 12/13/13 0750 12/13/13 0800 12/13/13 1017  BP:   116/54   Pulse:  89 87   Temp:   97.6 F (36.4 C)   TempSrc:   Oral   Resp:  13 17   Height:      Weight:      SpO2: 100% 100% 100% 95%    Intake/Output Summary (Last 24 hours) at 12/13/13 1024 Last data filed at 12/13/13 0700  Gross per 24 hour  Intake      0 ml  Output    250 ml  Net   -250 ml    Exam:   General:  Pt is alert, follows commands appropriately, not in acute distress  Cardiovascular: Regular rate and rhythm, S1/S2, no murmurs  Respiratory: Expiratory wheezing and poor breath sounds bilaterally   Abdomen: Soft, non tender, non distended, bowel sounds present  Extremities: No edema, pulses DP and PT palpable bilaterally  Neuro: Grossly nonfocal  Data Reviewed: Basic Metabolic Panel:  Recent Labs Lab 12/13/13 0130  NA 138  K 4.6  CL 91*  CO2 35*  GLUCOSE 133*  BUN 5*  CREATININE 0.41*  CALCIUM 10.0   CBC:  Recent Labs Lab 12/13/13 0130  WBC 9.1  HGB 16.2*  HCT 49.2*  MCV 90.9  PLT 252    Recent Results (from the past 240 hour(s))  MRSA PCR SCREENING     Status: None   Collection Time    12/13/13  7:08 AM      Result Value Ref Range Status   MRSA by PCR NEGATIVE  NEGATIVE Final   Comment:  The GeneXpert MRSA Assay (FDA     approved for NASAL specimens     only), is one component of a     comprehensive MRSA colonization     surveillance program. It is not     intended to diagnose MRSA     infection nor to guide or     monitor treatment for     MRSA infections.     Scheduled Meds: . doxycycline (VIBRAMYCIN) IV  100 mg Intravenous BID  . heparin  5,000 Units Subcutaneous 3 times per day  . ipratropium-albuterol  3 mL Nebulization Q2H  . methylPREDNISolone sodium succinate  125 mg Intravenous 4 times per day  . sodium chloride  3 mL Intravenous Q12H   Continuous Infusions: . sodium chloride 50 mL/hr (12/13/13 0815)

## 2013-12-13 NOTE — Progress Notes (Signed)
INITIAL NUTRITION ASSESSMENT  DOCUMENTATION CODES Per approved criteria  -Not Applicable   INTERVENTION: -Recommend Boost Plus TID, each supplement provides 360 kcal, 14 gram protein -Diet advancement per MD -RD to continue to follow  NUTRITION DIAGNOSIS: Inadequate oral intake related to SOB/sinuses as evidenced by PO intake < 75% for one week.   Goal: Pt to meet >/= 90% of their estimated nutrition needs    Monitor:  Total protein/energy intake, labs, diet order, weights  Reason for Assessment: MST  53 y.o. female  Admitting Dx: <principal problem not specified>  ASSESSMENT: 53 y.o. year old female with COPD, chronic resp failure-no longer on continuous O2, tobacco abuse, VDRF in January 2015 secondary to metapneumovirus, bipolar depression, hx/o TIA, recently treated with Augmentin for sinusitis, presented with dyspnea, productive cough of yellow sputum, fevers. Upon initial evaluation in ED, found to be in acute hypoxic respiratory failure with oxygen saturation in 80's on RA, requiring BiPAP and with subsequent improvement in symptoms. Pt given solumedrol and BDs in ED.  -Pt reported decreased appetite for past one week d/t ongoing difficulties with sinuses, and respiratory issues -Diet has largely consisted of liquids, drinks Boost Plus TID as pt finds it easier to tolerate liquids vs expending additional energy preparing and eating solid foods -Current PO intake approximately 50% of meals, wearing Bipap during time of RD assessment. Was able to drink fluids (orange juice, milk) while still wearing device -Endorsed weight loss; however was unable to quantify amount or when weight loss began. Previous medical records indicate pt has lost 23 lbs in past 9 months. However, this weight appears to be an outlier as other weights indicate pt stays around 130-135 lbs; indicating an approximate 11 lbs in one year (9% body weight loss, non-significant for time frame)   Height: Ht  Readings from Last 1 Encounters:  12/13/13 5\' 7"  (1.702 m)    Weight: Wt Readings from Last 1 Encounters:  12/13/13 123 lb 3.8 oz (55.9 kg)    Ideal Body Weight: 135 lb  % Ideal Body Weight: 91%  Wt Readings from Last 10 Encounters:  12/13/13 123 lb 3.8 oz (55.9 kg)  03/27/13 146 lb 6.2 oz (66.4 kg)  01/10/13 134 lb 12.8 oz (61.145 kg)  12/27/12 131 lb 8 oz (59.648 kg)  10/30/12 127 lb 6.8 oz (57.8 kg)  10/30/12 127 lb 6.8 oz (57.8 kg)  10/13/12 127 lb 6.4 oz (57.788 kg)  07/13/12 138 lb (62.596 kg)  02/18/12 134 lb (60.782 kg)  12/23/11 134 lb (60.782 kg)    Usual Body Weight: approximately 130-135 lbs per previous medical records  % Usual Body Weight: 92%  BMI:  Body mass index is 19.3 kg/(m^2).  Estimated Nutritional Needs: Kcal: 1700-1900 Protein: 65-80 gram Fluid: >/= 1700 ml daily  Skin: WDL  Diet Order: Full Liquid  EDUCATION NEEDS: -Education needs addressed   Intake/Output Summary (Last 24 hours) at 12/13/13 1351 Last data filed at 12/13/13 1300  Gross per 24 hour  Intake    930 ml  Output   1200 ml  Net   -270 ml    Last BM: pta   Labs:   Recent Labs Lab 12/13/13 0130  NA 138  K 4.6  CL 91*  CO2 35*  BUN 5*  CREATININE 0.41*  CALCIUM 10.0  GLUCOSE 133*    CBG (last 3)  No results found for this basename: GLUCAP,  in the last 72 hours  Scheduled Meds: . antiseptic oral rinse  7 mL  Mouth Rinse q12n4p  . chlorhexidine  15 mL Mouth Rinse BID  . doxycycline (VIBRAMYCIN) IV  100 mg Intravenous BID  . heparin  5,000 Units Subcutaneous 3 times per day  . ipratropium-albuterol  3 mL Nebulization Q4H  . lactose free nutrition  237 mL Oral TID WC  . methylPREDNISolone sodium succinate  40 mg Intravenous Q12H  . sodium chloride  3 mL Intravenous Q12H    Continuous Infusions: . sodium chloride 50 mL/hr (12/13/13 0815)    Past Medical History  Diagnosis Date  . TIA (transient ischemic attack)   . GERD (gastroesophageal reflux  disease)   . Hepatomegaly     hx  . Colonic inertia   . Lung abscess     "calcified over"; bilateral   . COPD (chronic obstructive pulmonary disease)   . Stroke 2009    TIA  . PTSD (post-traumatic stress disorder)   . Bipolar disorder   . Depression   . Chronic headache   . Seizures     in past  . Fibromyalgia   . Iron deficiency anemia   . Arthritis   . Acute ischemic colitis 11/01/2012  . Marijuana abuse 2014    positive screen in hospital  . TIA (transient ischemic attack)     Past Surgical History  Procedure Laterality Date  . 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 Cesarean section      x 2  . Esophagogastroduodenoscopy  12/23/2011    Procedure: ESOPHAGOGASTRODUODENOSCOPY (EGD);  Surgeon: Hart Carwinora M Brodie, MD;  [facility];  Service: Endoscopy;  Laterality: N/A;  . Colonoscopy    . Colonoscopy N/A 11/01/2012    Procedure: COLONOSCOPY;  Surgeon: Iva Booparl E Gessner, MD;  Location: [facility];  Service: Endoscopy;  Laterality: N/A;    Lloyd HugerSarah F Karisha Marlin MS RD LDN Clinical Dietitian [phone]

## 2013-12-13 NOTE — ED Notes (Signed)
Report received from previous RN, Tobi Bastos.

## 2013-12-13 NOTE — ED Notes (Signed)
Pt request pain medication for a headache

## 2013-12-13 NOTE — ED Provider Notes (Signed)
CSN: [id number]     Arrival date & time 12/13/13  0100 History   First MD Initiated Contact with Patient 12/13/13 0120     Chief Complaint  Patient presents with  . Shortness of Breath     (Consider location/radiation/quality/duration/timing/severity/associated sxs/prior Treatment) HPI Patient is recently being treated for a sinus infection with amoxicillin. She has increased difficulty breathing with wheezing. She's had nonproductive cough. She states she was going to see her doctor tomorrow but could not wait. She called her doctor's office and they told her to increase her oxygen use at home. She denies any lower extremity swelling or pain. Past Medical History  Diagnosis Date  . TIA (transient ischemic attack)   . GERD (gastroesophageal reflux disease)   . Hepatomegaly     hx  . Colonic inertia   . Lung abscess     "calcified over"; bilateral   . COPD (chronic obstructive pulmonary disease)   . Stroke 2009    TIA  . PTSD (post-traumatic stress disorder)   . Bipolar disorder   . Depression   . Chronic headache   . Seizures     in past  . Fibromyalgia   . Iron deficiency anemia   . Arthritis   . Acute ischemic colitis 11/01/2012  . Marijuana abuse 2014    positive screen in hospital  . TIA (transient ischemic attack)    Past Surgical History  Procedure Laterality Date  . 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 Cesarean section      x 2  . Esophagogastroduodenoscopy  12/23/2011    Procedure: ESOPHAGOGASTRODUODENOSCOPY (EGD);  Surgeon: Hart Carwinora M Brodie, MD;  [facility];  Service: Endoscopy;  Laterality: N/A;  . Colonoscopy    . Colonoscopy N/A 11/01/2012    Procedure: COLONOSCOPY;  Surgeon: Iva Booparl E Gessner, MD;  Location: [facility];  Service: Endoscopy;  Laterality: N/A;   Family History  Problem Relation Age of Onset  . Emphysema Father   . Asthma Son   . Clotting disorder Mother   . Rheum arthritis Sister   . Rheum  arthritis Brother   . Ovarian cancer Sister    History  Substance Use Topics  . Smoking status: Current Every Day Smoker -- 1.00 packs/day for 39 years    Types: Cigarettes  . Smokeless tobacco: Never Used     Comment: 3-4 daily11/11/14  . Alcohol Use: No   OB History   Grav Para Term Preterm Abortions TAB SAB Ect Mult Living                 Review of Systems  Constitutional: Negative for fever and chills.  HENT: Positive for congestion and sinus pressure. Negative for sore throat.   Respiratory: Positive for cough, shortness of breath and wheezing.   Cardiovascular: Negative for chest pain, palpitations and leg swelling.  Gastrointestinal: Negative for nausea, vomiting, abdominal pain and diarrhea.  Musculoskeletal: Negative for back pain, neck pain and neck stiffness.  Skin: Negative for rash and wound.  Neurological: Negative for dizziness, weakness, numbness and headaches.      Allergies  Oxycodone-acetaminophen; Codeine; Hydrocodone; and Quinolones  Home Medications   Prior to Admission medications   Medication Sig Start Date End Date Taking? Authorizing Provider  acetaminophen (TYLENOL) 500 MG tablet Take 1,000 mg by mouth every 6 (six) hours as needed for moderate pain.   Yes Historical Provider, MD  albuterol (PROVENTIL) (2.5  MG/3ML) 0.083% nebulizer solution Take 2.5 mg by nebulization every 6 (six) hours as needed for shortness of breath. For shortness of breath   Yes Historical Provider, MD  albuterol (VENTOLIN HFA) 108 (90 BASE) MCG/ACT inhaler Inhale 2 puffs into the lungs every 6 (six) hours as needed for wheezing or shortness of breath.    Yes Historical Provider, MD  aspirin EC 81 MG tablet Take 1 tablet (81 mg total) by mouth daily. 10/14/12  Yes Costin Otelia Sergeant, MD  Cholecalciferol (VITAMIN D-3) 5000 UNITS TABS Take 5,000 Units by mouth daily.   Yes Historical Provider, MD  cyclobenzaprine (FLEXERIL) 5 MG tablet Take 1 tablet (5 mg total) by mouth 3 (three)  times daily as needed for muscle spasms. 10/14/12  Yes Costin Otelia Sergeant, MD  diazepam (VALIUM) 10 MG tablet Take 1 tablet (10 mg total) by mouth every 12 (twelve) hours as needed for anxiety. 11/02/12  Yes Dorothea Ogle, MD  dicyclomine (BENTYL) 20 MG tablet Take 1 tablet (20 mg total) by mouth 3 (three) times daily before meals. 11/02/12  Yes Dorothea Ogle, MD  DULERA 100-5 MCG/ACT AERO inhale 2 puffs by mouth twice a day   Yes Barbaraann Share, MD  fluticasone [facility]) 50 MCG/ACT nasal spray Place 2 sprays into both nostrils daily as needed for allergies or rhinitis.   Yes Historical Provider, MD  hydroxypropyl methylcellulose (ISOPTO TEARS) 2.5 % ophthalmic solution Place 1 drop into both eyes 3 (three) times daily as needed for dry eyes.   Yes Historical Provider, MD  lactose free nutrition (BOOST PLUS) LIQD Take 237 mLs by mouth 2 (two) times daily between meals. 04/03/13  Yes Nishant Dhungel, MD  lidocaine (XYLOCAINE) 5 % ointment Apply 1 application topically 2 (two) times daily as needed for mild pain.   Yes Historical Provider, MD  loratadine (CLARITIN) 10 MG tablet Take 10 mg by mouth every evening.    Yes Historical Provider, MD  oxyCODONE (ROXICODONE) 15 MG immediate release tablet Take 1 tablet (15 mg total) by mouth every 6 (six) hours as needed for pain (back pain). 04/03/13  Yes Nishant Dhungel, MD  pantoprazole (PROTONIX) 40 MG tablet Take 1 tablet (40 mg total) by mouth daily at 6 (six) AM. 11/02/12  Yes Dorothea Ogle, MD  polyethylene glycol (MIRALAX / Ethelene Hal) packet Take 17 g by mouth daily as needed (constipation).   Yes Historical Provider, MD  promethazine (PHENERGAN) 25 MG tablet Take 0.5-1 tablets (12.5-25 mg total) by mouth every 6 (six) hours as needed for nausea. 11/02/12  Yes Dorothea Ogle, MD  SPIRIVA HANDIHALER 18 MCG inhalation capsule inhale contents of 1 capsule by mouth once daily 12/01/12  Yes Barbaraann Share, MD  VITAMIN E PO Take 1 capsule by mouth daily.   Yes Historical  Provider, MD   BP 129/111  Pulse 103  Resp 26  SpO2 95%  LMP 05/29/2010 Physical Exam  Nursing note and vitals reviewed. Constitutional: She is oriented to person, place, and time. She appears well-developed and well-nourished. She appears distressed.  Mild respiratory distress  HENT:  Head: Normocephalic and atraumatic.  Mouth/Throat: Oropharynx is clear and moist. No oropharyngeal exudate.  Eyes: EOM are normal. Pupils are equal, round, and reactive to light.  Neck: Normal range of motion. Neck supple.  Cardiovascular: Normal rate and regular rhythm.   Pulmonary/Chest: No respiratory distress. She has wheezes (end expiratory wheezing throughout). She has no rales.  Increased respiratory effort  Abdominal: Soft. Bowel  sounds are normal. She exhibits no distension and no mass. There is no tenderness. There is no rebound and no guarding.  Musculoskeletal: Normal range of motion. She exhibits no edema and no tenderness.  No calf swelling or tenderness.  Neurological: She is alert and oriented to person, place, and time.  Moves all extremities without deficit. Sensation is intact.  Skin: Skin is warm and dry. No rash noted. No erythema.  Psychiatric:  Anxious-appearing    ED Course  Procedures (including critical care time) Labs Review Labs Reviewed  BASIC METABOLIC PANEL - Abnormal; Notable for the following:    Chloride 91 (*)    CO2 35 (*)    Glucose, Bld 133 (*)    BUN 5 (*)    Creatinine, Ser 0.41 (*)    All other components within normal limits  CBC - Abnormal; Notable for the following:    RBC 5.41 (*)    Hemoglobin 16.2 (*)    HCT 49.2 (*)    All other components within normal limits  I-STAT TROPOININ, ED    Imaging Review No results found.   EKG Interpretation None      Date: 12/13/2013  Rate: 100   Rhythm: sinus tachycardia  QRS Axis: normal  Intervals: normal  ST/T Wave abnormalities: normal  Conduction Disutrbances:none  Narrative Interpretation:    Old EKG Reviewed: none available   MDM   Final diagnoses:  Shortness of breath   Patient continues to have wheezing and oxygen requirements despite continuous nebulized treatment and steroids. ABG with elevated PCO2 and decreased PO2. Discuss with hospitalist and will see in emergency department. We'll give trial of BiPAP     Loren Racer, MD 12/13/13 0403

## 2013-12-14 DIAGNOSIS — E860 Dehydration: Secondary | ICD-10-CM

## 2013-12-14 DIAGNOSIS — J9621 Acute and chronic respiratory failure with hypoxia: Secondary | ICD-10-CM

## 2013-12-14 DIAGNOSIS — K5900 Constipation, unspecified: Secondary | ICD-10-CM

## 2013-12-14 DIAGNOSIS — J441 Chronic obstructive pulmonary disease with (acute) exacerbation: Principal | ICD-10-CM

## 2013-12-14 LAB — CBC
HCT: 45.1 % (ref 36.0–46.0)
Hemoglobin: 14.4 g/dL (ref 12.0–15.0)
MCH: 29.3 pg (ref 26.0–34.0)
MCHC: 31.9 g/dL (ref 30.0–36.0)
MCV: 91.9 fL (ref 78.0–100.0)
PLATELETS: 209 10*3/uL (ref 150–400)
RBC: 4.91 MIL/uL (ref 3.87–5.11)
RDW: 14.2 % (ref 11.5–15.5)
WBC: 8.8 10*3/uL (ref 4.0–10.5)

## 2013-12-14 LAB — BASIC METABOLIC PANEL
ANION GAP: 10 (ref 5–15)
BUN: 10 mg/dL (ref 6–23)
CALCIUM: 9.4 mg/dL (ref 8.4–10.5)
CO2: 35 mEq/L — ABNORMAL HIGH (ref 19–32)
Chloride: 89 mEq/L — ABNORMAL LOW (ref 96–112)
Creatinine, Ser: 0.5 mg/dL (ref 0.50–1.10)
GFR calc non Af Amer: >90 mL/min (ref >90)
Glucose, Bld: 126 mg/dL — ABNORMAL HIGH (ref 70–99)
Potassium: 4.4 mEq/L (ref 3.7–5.3)
Sodium: 134 mEq/L — ABNORMAL LOW (ref 137–147)

## 2013-12-14 MED ORDER — ASPIRIN EC 81 MG PO TBEC
81.0000 mg | DELAYED_RELEASE_TABLET | Freq: Every day | ORAL | Status: DC
Start: 2013-12-14 — End: 2013-12-20
  Administered 2013-12-14 – 2013-12-20 (×7): 81 mg via ORAL
  Filled 2013-12-14 (×7): qty 1

## 2013-12-14 MED ORDER — FLUTICASONE PROPIONATE 50 MCG/ACT NA SUSP
2.0000 | Freq: Every day | NASAL | Status: DC | PRN
  Administered 2013-12-15: 2 via NASAL
  Filled 2013-12-14: qty 16

## 2013-12-14 MED ORDER — PANTOPRAZOLE SODIUM 40 MG PO TBEC
40.0000 mg | DELAYED_RELEASE_TABLET | Freq: Every day | ORAL | Status: DC
  Administered 2013-12-14 – 2013-12-19 (×6): 40 mg via ORAL
  Filled 2013-12-14 (×9): qty 1

## 2013-12-14 MED ORDER — CYCLOBENZAPRINE HCL 10 MG PO TABS
5.0000 mg | ORAL_TABLET | Freq: Once | ORAL | Status: AC
  Administered 2013-12-14: 5 mg via ORAL
  Filled 2013-12-14: qty 1

## 2013-12-14 MED ORDER — DICYCLOMINE HCL 20 MG PO TABS
20.0000 mg | ORAL_TABLET | Freq: Three times a day (TID) | ORAL | Status: DC
  Administered 2013-12-14 – 2013-12-20 (×19): 20 mg via ORAL
  Filled 2013-12-14 (×21): qty 1

## 2013-12-14 MED ORDER — CYCLOBENZAPRINE HCL 5 MG PO TABS
5.0000 mg | ORAL_TABLET | Freq: Three times a day (TID) | ORAL | Status: DC | PRN
  Administered 2013-12-15 – 2013-12-20 (×6): 5 mg via ORAL
  Filled 2013-12-14 (×6): qty 1

## 2013-12-14 MED ORDER — BOOST PLUS PO LIQD: 237.0000 mL | Freq: Two times a day (BID) | ORAL | Status: DC

## 2013-12-14 MED ORDER — FENTANYL CITRATE 0.05 MG/ML IJ SOLN
12.5000 ug | INTRAMUSCULAR | Status: DC | PRN
  Administered 2013-12-14 – 2013-12-17 (×9): 12.5 ug via INTRAVENOUS
  Filled 2013-12-14 (×9): qty 2

## 2013-12-14 MED ORDER — VITAMIN D-3 125 MCG (5000 UT) PO TABS: 5000.0000 [IU] | ORAL_TABLET | Freq: Every day | ORAL | Status: DC

## 2013-12-14 MED ORDER — VITAMIN D3 25 MCG (1000 UNIT) PO TABS
5000.0000 [IU] | ORAL_TABLET | Freq: Every day | ORAL | Status: DC
  Administered 2013-12-14 – 2013-12-18 (×5): 5000 [IU] via ORAL
  Filled 2013-12-14 (×6): qty 5

## 2013-12-14 NOTE — Clinical Documentation Improvement (Signed)
  Please clarify the underlying CAUSE of the patient's acute respiratory failure with hypoxia and hypercapnia.  Please clarify if the diagnosis, protein calorie malnutrition, severe, is applicable to this admission.  Prealbumin level this admission - 21.0.  Registered Dietician consult dated 12/13/13 at 2:18 pm.  If severe protein calorie malnutrition is applicable, please provide observation and clinical indicators of the diagnosis.   Thank You, Jerral Ralph ,RN Clinical Documentation Specialist:  [phone] [facility]- Health Information Management

## 2013-12-14 NOTE — Progress Notes (Addendum)
Patient ID: [name], female   DOB: [birth year], 53 y.o.   MRN: [id number] [facility] PROGRESS NOTE  [name] YNW:[id number] DOB: 1960/11/25 DOA: 12/13/2013 PCP: Kaleen Mask, MD  Brief narrative: 53 y.o. year old female with COPD, chronic resp failure-no longer on continuous O2, tobacco abuse, VDRF in January 2015 secondary to metapneumovirus, bipolar depression, hx/o TIA, recently treated with Augmentin for sinusitis, presented with dyspnea, productive cough of yellow sputum, fevers. Upon initial evaluation in ED, found to be in acute hypoxic respiratory failure with oxygen saturation in 80's on RA, requiring BiPAP and with subsequent improvement in symptoms. Pt given solumedrol and BDs in ED.   Assessment/Plan:   Principal Problem:  Acute respiratory failure with hypoxia and hypercapnia   Likely secondary to COPD exacerbation, bronchitis  Oxygen saturation 97% this am on VM oxygen support. Did not require BiPAP overnight.   Continue solumedrol at current dose 40 mg Q 12 hours IV.  Continue duoneb every 2 hours PRN shortness of breath or wheezing, continue duoneb every 4 hours scheduled.  Continue doxycycline.  Active Problems: Acute COPD exacerbation / Acute bronchitis  COPD gold alert ordered.   Continue regimen as above with bronchodilators as needed and scheduled. Continue steroids and doxycycline.  VM for oxygen support. Dehydration   Apperaed dehydrated on physical exam. Also had low BP 99/46. BP this am 128/63.  Receving IV fluids. Better PO intake this am. Mild protein calorie malnutrition  Pre-albumin on lower end of normal. BMI is appropriate, 19. Good PO intake.  DVT Prophylaxis   Heparin SQ    Code Status: Full.  Family Communication: plan of care discussed with the patient  Disposition Plan: requires frequent Ventimask so will keep in SDU.   IV Access:   Peripheral IV Procedures and diagnostic studies:    CXR 12/13/2013  Hyperexpanded lungs without acute cardiopulmonary disease.   Medical Consultants:   None  Other Consultants:   None   Anti-Infectives:   Doxycycline 10/14 -->   Manson Passey, MD  [facility] Pager [phone]  If 7PM-7AM, please contact night-coverage [email or web address] Password [email or web address] 12/14/2013, 6:55 AM   LOS: 1 day    HPI/Subjective: No acute overnight events.  Objective: Filed Vitals:   12/14/13 0323 12/14/13 0400 12/14/13 0500 12/14/13 0600  BP:  115/53  128/63  Pulse:  92  94  Temp:  97.3 F (36.3 C)    TempSrc:  Axillary    Resp:  17  20  Height:      Weight:   57.9 kg (127 lb 10.3 oz)   SpO2: 99% 97%  97%    Intake/Output Summary (Last 24 hours) at 12/14/13 0655 Last data filed at 12/14/13 0600  Gross per 24 hour  Intake   2150 ml  Output   3000 ml  Net   -850 ml    Exam:   General:  Pt is not in distress   Cardiovascular: tachycardia, S1/S2, no murmurs  Respiratory: diminished breath sounds; rhonchorous, congested  Abdomen: Soft, non tender, non distended, bowel sounds present  Extremities: No edema, pulses DP and PT palpable bilaterally  Neuro: Non focal  Data Reviewed: Basic Metabolic Panel:  Recent Labs Lab 12/13/13 0130 12/14/13 0320  NA 138 134*  K 4.6 4.4  CL 91* 89*  CO2 35* 35*  GLUCOSE 133* 126*  BUN 5* 10  CREATININE 0.41* 0.50  CALCIUM 10.0 9.4   Liver Function Tests: No results found for this basename: AST, ALT, ALKPHOS, BILITOT,  PROT, ALBUMIN,  in the last 168 hours No results found for this basename: LIPASE, AMYLASE,  in the last 168 hours No results found for this basename: AMMONIA,  in the last 168 hours CBC:  Recent Labs Lab 12/13/13 0130 12/14/13 0320  WBC 9.1 8.8  HGB 16.2* 14.4  HCT 49.2* 45.1  MCV 90.9 91.9  PLT 252 209   Cardiac Enzymes: No results found for this basename: CKTOTAL, CKMB, CKMBINDEX, TROPONINI,  in the last 168 hours BNP: No components found with this basename: POCBNP,   CBG: No results found for this basename: GLUCAP,  in the last 168 hours  MRSA PCR SCREENING     Status: None   Collection Time    12/13/13  7:08 AM      Result Value Ref Range Status   MRSA by PCR NEGATIVE  NEGATIVE Final  RESPIRATORY VIRUS PANEL     Status: None   Collection Time    12/13/13  8:14 AM      Result Value Ref Range Status   Source - RVPAN NASAL SWAB   Corrected   Comment: CORRECTED ON 10/14 AT 2048: PREVIOUSLY REPORTED AS NASAL SWAB   Respiratory Syncytial Virus A NOT DETECTED   Final   Respiratory Syncytial Virus B NOT DETECTED   Final   Influenza A NOT DETECTED   Final   Influenza B NOT DETECTED   Final   Parainfluenza 1 NOT DETECTED   Final   Parainfluenza 2 NOT DETECTED   Final   Parainfluenza 3 NOT DETECTED   Final   Metapneumovirus NOT DETECTED   Final   Rhinovirus NOT DETECTED   Final   Adenovirus NOT DETECTED   Final   Influenza A H1 NOT DETECTED   Final   Influenza A H3 NOT DETECTED   Final     Scheduled Meds: . doxycycline (VIBRAMYCIN) IV  100 mg Intravenous BID  . heparin  5,000 Units Subcutaneous 3 times per day  . ipratropium-albuterol  3 mL Nebulization Q4H  . lactose free nutrition  237 mL Oral TID WC  . methylPREDNISolone sodium succinate  40 mg Intravenous Q12H   Continuous Infusions: . sodium chloride 50 mL/hr (12/13/13 0815)

## 2013-12-15 MED ORDER — SALINE SPRAY 0.65 % NA SOLN
1.0000 | NASAL | Status: DC | PRN
  Administered 2013-12-19: 1 via NASAL
  Filled 2013-12-15: qty 44

## 2013-12-15 MED ORDER — MIRTAZAPINE 15 MG PO TABS
15.0000 mg | ORAL_TABLET | Freq: Every day | ORAL | Status: DC
  Administered 2013-12-15 – 2013-12-19 (×5): 15 mg via ORAL
  Filled 2013-12-15 (×6): qty 1

## 2013-12-15 MED ORDER — DOXYCYCLINE HYCLATE 100 MG PO TABS
100.0000 mg | ORAL_TABLET | Freq: Two times a day (BID) | ORAL | Status: DC
  Administered 2013-12-15 – 2013-12-18 (×7): 100 mg via ORAL
  Filled 2013-12-15 (×10): qty 1

## 2013-12-15 MED ORDER — IPRATROPIUM-ALBUTEROL 0.5-2.5 (3) MG/3ML IN SOLN
3.0000 mL | Freq: Three times a day (TID) | RESPIRATORY_TRACT | Status: DC
  Administered 2013-12-15 – 2013-12-20 (×14): 3 mL via RESPIRATORY_TRACT
  Filled 2013-12-15 (×15): qty 3

## 2013-12-15 NOTE — Progress Notes (Signed)
NUTRITION FOLLOW UP  Pt meets criteria for moderate MALNUTRITION in the context of chronic illness as evidenced by PO intake < 75% for one week; moderate muscle wasting and subcutaneous fat loss in temporal, occipital, clavicle and upper arm regions.   Intervention:   -Continue to encourage Boost Plus Supplements TID -Resource Breeze supplement PRN -Offered additional snacks/supplement alternatives -Assisted in meal ordering -RD to continue to monitor  Nutrition Dx:   Inadequate oral intake related to SOB/sinuses as evidenced by PO intake < 75% for one week   Goal:   Pt to meet >/= 90% of their estimated nutrition needs; progressing  Monitor:   Total protein/energy intake, labs, weights  Assessment:   10/14: -Pt reported decreased appetite for past one week d/t ongoing difficulties with sinuses, and respiratory issues  -Diet has largely consisted of liquids, drinks Boost Plus TID as pt finds it easier to tolerate liquids vs expending additional energy preparing and eating solid foods  -Current PO intake approximately 50% of meals, wearing Bipap during time of RD assessment. Was able to drink fluids (orange juice, milk) while still wearing device  -Endorsed weight loss; however was unable to quantify amount or when weight loss began. Previous medical records indicate pt has lost 23 lbs in past 9 months. However, this weight appears to be an outlier as other weights indicate pt stays around 130-135 lbs; indicating an approximate 11 lbs in one year (9% body weight loss, non-significant for time frame)  10/16: -Pt's diet advanced to regular on 10/14 -Pt lethargic during time of RD follow up, reported to have continued minimal intake of solid foods (50% or less) during admit. Continues to drink Boost Plus supplement, consumes at least two daily. Encouraged PO intake and assisted with meal ordering. Pt willing to have balanced breakfast with good sources of protein -Discussed other  supplement or snack alternatives to encourage PO intake. Pt had tried [employer] Breeze supplement, did not have preference between [employer]reeze or Boost. Will order Breeze PRN for supplement variety -RD noted pt with moderate muscle wasting and fat loss in clavicle, temporal, occipital, and upper arm regions -Pt with snacks at bedside; however have minimal nutrition quality (chips, candies, etc)  Height: Ht Readings from Last 1 Encounters:  12/13/13 5\' 7"  (1.702 m)    Weight Status:   Wt Readings from Last 1 Encounters:  12/15/13 135 lb 9.3 oz (61.5 kg)    Re-estimated needs:  Kcal: 1700-1900  Protein: 65-80 gram  Fluid: >/= 1700 ml daily   Skin: WDL  Diet Order: General   Intake/Output Summary (Last 24 hours) at 12/15/13 1152 Last data filed at 12/15/13 0800  Gross per 24 hour  Intake 1751.67 ml  Output   1325 ml  Net 426.67 ml    Last BM: 10/15   Labs:   Recent Labs Lab 12/13/13 0130 12/14/13 0320  NA 138 134*  K 4.6 4.4  CL 91* 89*  CO2 35* 35*  BUN 5* 10  CREATININE 0.41* 0.50  CALCIUM 10.0 9.4  GLUCOSE 133* 126*    CBG (last 3)  No results found for this basename: GLUCAP,  in the last 72 hours  Scheduled Meds: . antiseptic oral rinse  7 mL Mouth Rinse q12n4p  . aspirin EC  81 mg Oral Daily  . chlorhexidine  15 mL Mouth Rinse BID  . cholecalciferol  5,000 Units Oral Daily  . dicyclomine  20 mg Oral TID AC  . doxycycline  100 mg Oral Q12H  .  heparin  5,000 Units Subcutaneous 3 times per day  . ipratropium-albuterol  3 mL Nebulization TID  . lactose free nutrition  237 mL Oral TID WC  . methylPREDNISolone sodium succinate  40 mg Intravenous Q12H  . mirtazapine  15 mg Oral QHS  . pantoprazole  40 mg Oral Q0600  . sodium chloride  3 mL Intravenous Q12H    Continuous Infusions: . sodium chloride 10 mL/hr at 12/15/13 0816    Lloyd Huger MS RD LDN Clinical Dietitian [phone]

## 2013-12-15 NOTE — Progress Notes (Signed)
This patient is receiving the antibiotic Doxycycline by the intravenous route. Based on criteria approved by the Pharmacy and Therapeutics Committee, and the Infectious Disease Division, the antibiotic is being converted to equivalent oral dose form. These criteria include: . Patient being treated for a respiratory tract infection, urinary tract infection, cellulitis, or Clostridium Difficile Associated Diarrhea . The patient is not neutropenic and does not exhibit a GI malabsorption state . The patient is eating (either orally or per tube) and/or has been taking other orally administered medications for at least 24 hours. . The patient is improving clinically (physician assessment and a 24-hour Tmax of < 100.5 F).  If you have questions about this conversion, please contact the pharmacy department. Thank you.  Clance Boll, PharmD, BCPS Pager: [phone] 12/15/2013 8:27 AM

## 2013-12-15 NOTE — Progress Notes (Signed)
RN stated she gave pt medicine for cough. Pt had nasal cannula on with humidification found set at 5 LPM. Duoneb restarted at 15:59. Family member left room once treatment was started. Pt tolerating treatment well. No coughing sitting up in chair. SPO2 with [place] and aerosol mask on 99%.

## 2013-12-15 NOTE — Progress Notes (Signed)
OT Cancellation Note  Patient Details Name: [name] MRN: [id number] DOB: 06/18/1960   Cancelled Treatment:    Reason Eval/Treat Not Completed: Other (comment).  Pt is very sleepy and hurts all over.  Asked me to return in about 2 hours.  Asked NT to wait on ADL until I arrive.  Will check back.  Samel Bruna 12/15/2013, 9:27 AM Marica Otter, OTR/L [phone] 12/15/2013

## 2013-12-15 NOTE — Evaluation (Signed)
Physical Therapy Evaluation Patient Details Name: Bethany SenegalCarla M [name] MRN: [id number] DOB: [birth year] Today's Date: 12/15/2013   History of Present Illness  53 yo female admitted with acute resp failure with hypoxia and hypercapnia. Hx of COPD, TIA, CVA, PTSD, bipolar d/o, Sz.   Clinical Impression  On eval, pt required Min assist for mobility-able to tolerate stand pivot to bsc and a few steps to recliner. Remained on O2 during session. Mod encouragement for pt participation.     Follow Up Recommendations Home health PT;Supervision - Intermittent    Equipment Recommendations  None recommended by PT    Recommendations for Other Services OT consult     Precautions / Restrictions Precautions Precautions: Fall Restrictions Weight Bearing Restrictions: No      Mobility  Bed Mobility Overal bed mobility: Needs Assistance Bed Mobility: Supine to Sit;Sit to Supine     Supine to sit: Min guard Sit to supine: Min guard   General bed mobility comments: close guard for safety  Transfers Overall transfer level: Needs assistance   Transfers: Sit to/from Stand;Stand Pivot Transfers Sit to Stand: Min assist Stand pivot transfers: Min assist       General transfer comment: assist to stabilize. [facility]  Ambulation/Gait Ambulation/Gait assistance: Min assist Ambulation Distance (Feet): 3 Feet Assistive device: None       General Gait Details: 3 short steps to recliner from bsc.   Stairs            Wheelchair Mobility    Modified Rankin (Stroke Patients Only)       Balance                                             Pertinent Vitals/Pain Pain Assessment: Faces Faces Pain Scale: Hurts little more Pain Location: "fibromyalgia pain"    Home Living Family/patient expects to be discharged to:: Private residence Living Arrangements: Children   Type of Home: Mobile home Home Access: Stairs to enter     Home Layout: One  level Home Equipment: Environmental consultantWalker - 4 wheels;Grab bars - tub/shower Additional Comments: normal height commode, step in shower     Prior Function Level of Independence: Independent with assistive device(s)         Comments: used 4 wheeled RW at times     Hand Dominance        Extremity/Trunk Assessment   Upper Extremity Assessment: Generalized weakness           Lower Extremity Assessment: Generalized weakness      Cervical / Trunk Assessment: Normal  Communication   Communication: No difficulties  Cognition Arousal/Alertness: Awake/alert Behavior During Therapy: WFL for tasks assessed/performed Overall Cognitive Status: Within Functional Limits for tasks assessed                      General Comments      Exercises        Assessment/Plan    PT Assessment Patient needs continued PT services  PT Diagnosis Difficulty walking;Generalized weakness   PT Problem List Decreased strength;Decreased activity tolerance;Decreased balance;Decreased mobility;Decreased knowledge of use of DME  PT Treatment Interventions DME instruction;Gait training;Functional mobility training;Therapeutic activities;Therapeutic exercise;Balance training;Patient/family education   PT Goals (Current goals can be found in the Care Plan section) Acute Rehab PT Goals Patient Stated Goal: to feel better PT Goal Formulation: With patient  Time For Goal Achievement: 12/29/13 Potential to Achieve Goals: Good    Frequency Min 3X/week   Barriers to discharge        Co-evaluation               End of Session Equipment Utilized During Treatment: Oxygen Activity Tolerance: Patient tolerated treatment well Patient left: in chair;with call bell/phone within reach;with chair alarm set           Time: 1229-1258 PT Time Calculation (min): 29 min   Charges:   PT Evaluation $Initial PT Evaluation Tier I: 1 Procedure PT Treatments $Therapeutic Activity: 8-22 mins   PT G  Codes:          Rebeca Alert, MPT Pager: [phone]

## 2013-12-15 NOTE — Progress Notes (Signed)
CSW received consult for COPD Gold Protocol, though patient does not meet qualifications (only 1 admission within the past 6 months).   CSW signing off.   Please re-consult if social work needs arise.  Suzanna Kidd, MSW, LCSW Clinical Social Work [phone]  

## 2013-12-15 NOTE — Progress Notes (Signed)
Treatment finished. Pt placed back on nasal cannula set at 5 LPM. SPO2 100%. Pt not coughing. Sitting in chair. RT will continue to monitor.

## 2013-12-15 NOTE — Progress Notes (Signed)
Pt wanted to stop treatment about halfway through. Pt started coughing and could not catch her breath. Stated that she wanted something to help her cough first. Will notify RN.

## 2013-12-15 NOTE — Evaluation (Signed)
Occupational Therapy Evaluation Patient Details Name: [name] MRN: [id number] DOB: 1960-06-09 Today's Date: 12/15/2013    History of Present Illness 53 yo female admitted with acute resp failure with hypoxia and hypercapnia. Hx of COPD, TIA, CVA, PTSD, bipolar d/o, Sz.    Clinical Impression   Pt was admitted for the above.  She is normally mod I with ADLs and needs up to max A at this time due to fatique and pain.  She will benefit from skilled OT to increase safety and independence with pain.  Focus will be on energy conservation and AE to maximize independence.  Goals in acute are for supervision level    Follow Up Recommendations  Supervision/Assistance - 24 hour;Home health OT    Equipment Recommendations  3 in 1 bedside comode    Recommendations for Other Services       Precautions / Restrictions Precautions Precautions: Fall Restrictions Weight Bearing Restrictions: No      Mobility Bed Mobility Overal bed mobility: Needs Assistance Bed Mobility: Supine to Sit;Sit to Supine     Supine to sit: Min guard    General bed mobility comments: close guard for safety  Transfers Overall transfer level: Needs assistance   Transfers: Sit to/from Stand Sit to Stand: Min assist Stand pivot transfers: Min assist       General transfer comment: assist to stabilize. stand [facility]    Balance                                            ADL Overall ADL's : Needs assistance/impaired     Grooming: Wash/dry hands;Wash/dry face;Sitting;Supervision/safety   Upper Body Bathing: Set up;Sitting   Lower Body Bathing: Moderate assistance;Sit to/from stand   Upper Body Dressing : Sitting;Minimal assistance (lines)   Lower Body Dressing: Maximal assistance;Sit to/from stand   Toilet Transfer: Minimal assistance;BSC;Stand-pivot   Toileting- Clothing Manipulation and Hygiene: Set up;Sitting/lateral lean         General ADL  Comments: Pt worked on bathing and changing gowns from 3:1 commode.  Had coughing spells and needed to catch breath a few times.  Pt fatiques easily and c/o fibromyalgia pain.       Vision                     Perception     Praxis      Pertinent Vitals/Pain Pain Assessment: Faces Faces Pain Scale: Hurts little more Pain Location: fibromyalgia     Hand Dominance     Extremity/Trunk Assessment Upper Extremity Assessment Upper Extremity Assessment: Generalized weakness      Cervical / Trunk Assessment Cervical / Trunk Assessment: Normal   Communication Communication Communication: No difficulties   Cognition Arousal/Alertness: Awake/alert Behavior During Therapy: WFL for tasks assessed/performed Overall Cognitive Status: Within Functional Limits for tasks assessed                     General Comments       Exercises       Shoulder Instructions      Home Living Family/patient expects to be discharged to:: Private residence Living Arrangements: Children Available Help at Discharge: Family;Available 24 hours/day Type of Home: Mobile home Home Access: Stairs to enter Entrance Stairs-Number of Steps: 3-4   Home Layout: One level     Bathroom Shower/Tub: Walk-in shower  Bathroom Toilet: Standard     Home Equipment: Shower seat - built in;Grab bars - tub/shower;Walker - 4 wheels   Additional Comments: normal height commode, step in shower       Prior Functioning/Environment Level of Independence: Independent with assistive device(s)        Comments: used 4 wheeled RW at times    OT Diagnosis: Generalized weakness   OT Problem List:     OT Treatment/Interventions:      OT Goals(Current goals can be found in the care plan section) Acute Rehab OT Goals Patient Stated Goal: to feel better OT Goal Formulation: With patient Time For Goal Achievement: 12/29/13 Potential to Achieve Goals: Good ADL Goals Pt Will Transfer to Toilet:  with supervision;ambulating;bedside commode Pt Will Perform Toileting - Clothing Manipulation and hygiene: with supervision;sit to/from stand Pt Will Perform Tub/Shower Transfer: with supervision;Shower transfer;shower seat;ambulating Additional ADL Goal #1: pt will complete ADL sit to stand at set up/supervision level with AE as needed for pain management and energy conservation  OT Frequency:     Barriers to D/C:            Co-evaluation PT/OT/SLP Co-Evaluation/Treatment: Yes Reason for Co-Treatment:  (decreased activity tolerance)          End of Session    Activity Tolerance: Patient limited by fatigue Patient left: in chair;with call bell/phone within reach;with chair alarm set   Time: 9147-8295 OT Time Calculation (min): 29 min Charges:  OT General Charges $OT Visit: 1 Procedure OT Evaluation $Initial OT Evaluation Tier I: 1 Procedure OT Treatments $Self Care/Home Management : 8-22 mins G-Codes:    Seri Kimmer 01/01/2014, 2:37 PM  Marica Otter, OTR/L [phone] 2014-01-01

## 2013-12-15 NOTE — Progress Notes (Addendum)
Patient ID: [name], female   DOB: October 30, 1960, 53 y.o.   MRN: [id number] [facility] PROGRESS NOTE  [name]:[id number] DOB: [birth year] DOA: 12/13/2013 PCP: Kaleen Mask, MD  Brief narrative: 53 y.o. year old female with COPD, chronic resp failure-no longer on continuous O2, tobacco abuse, VDRF in January 2015 secondary to metapneumovirus, bipolar depression, hx/o TIA, recently treated with Augmentin for sinusitis, presented with dyspnea, productive cough of yellow sputum, fevers. Upon initial evaluation in ED, found to be in acute hypoxic respiratory failure with oxygen saturation in 80's on RA, requiring BiPAP.   Assessment/Plan:   Principal Problem:  Acute respiratory failure with hypoxia and hypercapnia  Secondary to acute COPD exacerbation, bronchitis. Patient requires VM for oxygen support.  Will continue solumedrol at current dose 40 mg Q 12 hours IV. Continue duoneb every 2 hours PRN shortness of breath or wheezing, continue duoneb every 4 hours scheduled.  Continue doxycycline. Active Problems:  Acute COPD exacerbation / Acute bronchitis  COPD gold alert order in place. Continue with nebulizer treatments as mentioned above as needed and scheduled. Continue steroids and doxycycline.  VM for oxygen support. Dehydration  Apperaed dehydrated on physical exam. Also had low BP 99/46. BP better this morning. Stop IV fluids to prevent fluid overload. Mild protein calorie malnutrition  Pre-albumin on lower end of normal. BMI is appropriate, 19. Has relatively good po intake. Decrease IV fluids to KVO to prevent fluid overload. DVT Prophylaxis  Heparin SQ    Code Status: Full.  Family Communication: plan of care discussed with the patient  Disposition Plan: requires frequent Ventimask so will keep in SDU.   IV Access:   Peripheral IV Procedures and diagnostic studies:    CXR 12/13/2013 Hyperexpanded lungs without acute cardiopulmonary disease.    Medical Consultants:   None   Other Consultants:   None   Anti-Infectives:   Doxycycline 10/14 -->    Manson Passey, MD  [facility] Pager [phone]  If 7PM-7AM, please contact night-coverage [email or web address] Password [email or web address] 12/15/2013, 6:07 AM   LOS: 2 days    HPI/Subjective: No acute overnight events.  Objective: Filed Vitals:   12/15/13 0314 12/15/13 0400 12/15/13 0416 12/15/13 0500  BP:  112/57  115/56  Pulse: 97 96 94 96  Temp:   97.5 F (36.4 C)   TempSrc:   Oral   Resp: 19 20 18 19   Height:      Weight:   61.5 kg (135 lb 9.3 oz)   SpO2: 95% 95% 95% 96%    Intake/Output Summary (Last 24 hours) at 12/15/13 0607 Last data filed at 12/15/13 0416  Gross per 24 hour  Intake 1661.67 ml  Output   1100 ml  Net 561.67 ml    Exam:   General:  Pt is not in acute distress  Cardiovascular: tachycardic, S1/S2, no murmurs  Respiratory: coarse breath sounds appreciated, mild wheezing, no rhonchi or crackles .  Abdomen: Soft, non tender, non distended, bowel sounds present  Extremities: pulses DP and PT palpable bilaterally  Neuro: No focal neurologic findings  Data Reviewed: Basic Metabolic Panel:  Recent Labs Lab 12/13/13 0130 12/14/13 0320  NA 138 134*  K 4.6 4.4  CL 91* 89*  CO2 35* 35*  GLUCOSE 133* 126*  BUN 5* 10  CREATININE 0.41* 0.50  CALCIUM 10.0 9.4   Liver Function Tests: No results found for this basename: AST, ALT, ALKPHOS, BILITOT, PROT, ALBUMIN,  in the last 168 hours No results found for this  basename: LIPASE, AMYLASE,  in the last 168 hours No results found for this basename: AMMONIA,  in the last 168 hours CBC:  Recent Labs Lab 12/13/13 0130 12/14/13 0320  WBC 9.1 8.8  HGB 16.2* 14.4  HCT 49.2* 45.1  MCV 90.9 91.9  PLT 252 209   Cardiac Enzymes: No results found for this basename: CKTOTAL, CKMB, CKMBINDEX, TROPONINI,  in the last 168 hours BNP: No components found with this basename: POCBNP,  CBG: No  results found for this basename: GLUCAP,  in the last 168 hours  MRSA PCR SCREENING     Status: None   Collection Time    12/13/13  7:08 AM      Result Value Ref Range Status   MRSA by PCR NEGATIVE  NEGATIVE Final  RESPIRATORY VIRUS PANEL     Status: None   Collection Time    12/13/13  8:14 AM      Result Value Ref Range Status   Source - RVPAN NASAL SWAB   Corrected   Comment: CORRECTED ON 10/14 AT 2048: PREVIOUSLY REPORTED AS NASAL SWAB   Respiratory Syncytial Virus A NOT DETECTED   Final   Respiratory Syncytial Virus B NOT DETECTED   Final   Influenza A NOT DETECTED   Final   Influenza B NOT DETECTED   Final   Parainfluenza 1 NOT DETECTED   Final   Parainfluenza 2 NOT DETECTED   Final   Parainfluenza 3 NOT DETECTED   Final   Metapneumovirus NOT DETECTED   Final   Rhinovirus NOT DETECTED   Final   Adenovirus NOT DETECTED   Final   Influenza A H1 NOT DETECTED   Final   Influenza A H3 NOT DETECTED   Final     Scheduled Meds: . chlorhexidine  15 mL Mouth Rinse BID  . cholecalciferol  5,000 Units Oral Daily  . dicyclomine  20 mg Oral TID AC  . doxycycline (VIBRAMYCIN) IV  100 mg Intravenous BID  . heparin  5,000 Units Subcutaneous 3 times per day  . ipratropium-albuterol  3 mL Nebulization Q4H  . lactose free nutrition  237 mL Oral TID WC  . methylPREDNISolone sodium succinate  40 mg Intravenous Q12H  . pantoprazole  40 mg Oral Q0600   Continuous Infusions: . sodium chloride 50 mL/hr (12/13/13 0815)

## 2013-12-16 DIAGNOSIS — J208 Acute bronchitis due to other specified organisms: Secondary | ICD-10-CM

## 2013-12-16 DIAGNOSIS — E440 Moderate protein-calorie malnutrition: Secondary | ICD-10-CM | POA: Insufficient documentation

## 2013-12-16 MED ORDER — METHYLPREDNISOLONE SODIUM SUCC 40 MG IJ SOLR
40.0000 mg | Freq: Two times a day (BID) | INTRAMUSCULAR | Status: DC
  Filled 2013-12-16: qty 1

## 2013-12-16 MED ORDER — METHYLPREDNISOLONE SODIUM SUCC 40 MG IJ SOLR
40.0000 mg | INTRAMUSCULAR | Status: DC
  Administered 2013-12-17 – 2013-12-18 (×2): 40 mg via INTRAVENOUS
  Filled 2013-12-16 (×2): qty 1

## 2013-12-16 NOTE — Progress Notes (Signed)
Occupational Therapy Treatment Patient Details Name: Bethany SenegalCarla M [name] MRN: [id number] DOB: Apr 09, 1960 Today's Date: 12/16/2013    History of present illness 53 yo female admitted with acute resp failure with hypoxia and hypercapnia. Hx of COPD, TIA, CVA, PTSD, bipolar d/o, Sz.    OT comments  Pt tearful and anxious about her [place]  States this anxiety leads to her smoking.  Pt moving well, but noted to desat to 81% with HR 117 with stand pivot [facility], recovered after 2-3 minutes of rest and pursed lip breathing.  Instructed in energy conservation and provided handout.  Follow Up Recommendations  Supervision/Assistance - 24 hour;Home health OT    Equipment Recommendations  3 in 1 bedside comode    Recommendations for Other Services      Precautions / Restrictions Precautions Precautions: Fall Precaution Comments: watch sats       Mobility Bed Mobility Overal bed mobility: Modified Independent Bed Mobility: Supine to Sit;Sit to Supine              Transfers Overall transfer level: Needs assistance   Transfers: Stand Pivot Transfers Sit to Stand: Supervision Stand pivot transfers: Supervision            Balance                                   ADL Overall ADL's : Needs assistance/impaired     Grooming: Wash/dry hands;Supervision/safety;Standing                   Toilet Transfer: Supervision/safety;Stand-pivot   Toileting- ArchitectClothing Manipulation and Hygiene: Set up;Sitting/lateral lean       Functional mobility during ADLs: Supervision/safety General ADL Comments: Issued handout and instructed in energy conservation and pursed lip breathing techniques.  Pt able to cross her foot over opposite knee to donn and doff socks. Only able to tolerate one activity at sink.      Vision                     Perception     Praxis      Cognition   Behavior During Therapy: Anxious Overall  Cognitive Status: Within Functional Limits for tasks assessed                       Extremity/Trunk Assessment               Exercises     Shoulder Instructions       General Comments      Pertinent Vitals/ Pain       Pain Assessment: No/denies pain  Home Living                                          Prior Functioning/Environment              Frequency Min 2X/week     Progress Toward Goals  OT Goals(current goals can now be found in the care plan section)  Progress towards OT goals: Progressing toward goals  Acute Rehab OT Goals Patient Stated Goal: to feel better  Plan Discharge plan remains appropriate    Co-evaluation                 End of  Session Equipment Utilized During Treatment: Oxygen   Activity Tolerance Patient limited by fatigue   Patient Left in bed;with call bell/phone within reach   Nurse Communication  (requesting breathing techniques)        Time: 0630-1601 OT Time Calculation (min): 62 min  Charges: OT General Charges $OT Visit: 1 Procedure OT Treatments $Self Care/Home Management : 53-67 mins  [name] 12/16/2013, 4:22 PM

## 2013-12-16 NOTE — Progress Notes (Signed)
Patient talked to RT about taking Spiriva and Advair  (home regimine). RT will let RN know that this needs to be addressed with physician. RN notified.

## 2013-12-16 NOTE — Progress Notes (Signed)
Patient ID: [name], female   DOB: 05/15/1960, 53 y.o.   MRN: [id number] [facility] PROGRESS NOTE  AYLEN CASTEEN VQX:[id number] DOB: [birth year] DOA: 12/13/2013 PCP: Kaleen Mask, MD  Brief narrative: 53 y.o. year old female with COPD, chronic resp failure-no longer on continuous O2, tobacco abuse, VDRF in January 2015 secondary to metapneumovirus, bipolar depression, hx/o TIA, recently treated with Augmentin for sinusitis, presented with dyspnea, productive cough of yellow sputum, fevers. Upon initial evaluation in ED, found to be in acute hypoxic respiratory failure with oxygen saturation in 80's on RA, requiring BiPAP.   Assessment/Plan:   Principal Problem:  Acute respiratory failure with hypoxia and hypercapnia  Secondary to acute COPD exacerbation, bronchitis.  Respiratory status stable this am. No wheezing. Able to keep O2 sats above 90% with [place] oxygen support.  Taper solu-medrol down from Q 12 hours to Q 24 hours starting today, continue 40 mg dose. Continue duoneb every 2 hours PRN shortness of breath or wheezing, continue duoneb every 4 hours scheduled.  Continue doxycycline. Active Problems:  Acute COPD exacerbation / Acute bronchitis  COPD gold alert order in place.  Continue with nebulizer treatments as needed and scheduled. Continue steroids and doxycycline.  [place] for oxygen support. Dehydration  Improved with IV fluids. BP stable, 136/69. Mild protein calorie malnutrition  Pre-albumin on lower end of normal. BMI is appropriate, 19. Has relatively good po intake.  Decrease IV fluids to KVO to prevent fluid overload. DVT Prophylaxis  Heparin SQ    Code Status: Full.  Family Communication: plan of care discussed with the patient  Disposition Plan: transfer to medical floor today.  IV Access:   Peripheral IV  Procedures and diagnostic studies:    CXR 12/13/2013 Hyperexpanded lungs without acute cardiopulmonary disease.   Medical Consultants:    None   Other Consultants:   None   Anti-Infectives:   Doxycycline 10/14 -->     Manson Passey, MD  [facility] Pager [phone]  If 7PM-7AM, please contact night-coverage [email or web address] Password [email or web address] 12/16/2013, 11:09 AM   LOS: 3 days    HPI/Subjective: No acute overnight events.  Objective: Filed Vitals:   12/16/13 0838 12/16/13 0900 12/16/13 1000 12/16/13 1056  BP:  132/77 116/62 136/69  Pulse:  93 90 88  Temp: 97.7 F (36.5 C)   97.4 F (36.3 C)  TempSrc: Oral   Oral  Resp:  22 13 18   Height:    5\' 7"  (1.702 m)  Weight:    58.1 kg (128 lb 1.4 oz)  SpO2:  100% 100% 94%    Intake/Output Summary (Last 24 hours) at 12/16/13 1109 Last data filed at 12/16/13 1030  Gross per 24 hour  Intake 485.67 ml  Output      0 ml  Net 485.67 ml    Exam:   General:  Pt is alert, follows commands appropriately, not in acute distress  Cardiovascular: Regular rate and rhythm, S1/S2 appreciated   Respiratory: diminished breath sounds, no wheezing, no crackles, no rhonchi  Abdomen: Soft, non tender, non distended, bowel sounds present  Extremities: No edema, pulses DP and PT palpable bilaterally  Neuro: Grossly nonfocal  Data Reviewed: Basic Metabolic Panel:  Recent Labs Lab 12/13/13 0130 12/14/13 0320  NA 138 134*  K 4.6 4.4  CL 91* 89*  CO2 35* 35*  GLUCOSE 133* 126*  BUN 5* 10  CREATININE 0.41* 0.50  CALCIUM 10.0 9.4   Liver Function Tests: No results found for this basename: AST, ALT,  ALKPHOS, BILITOT, PROT, ALBUMIN,  in the last 168 hours No results found for this basename: LIPASE, AMYLASE,  in the last 168 hours No results found for this basename: AMMONIA,  in the last 168 hours CBC:  Recent Labs Lab 12/13/13 0130 12/14/13 0320  WBC 9.1 8.8  HGB 16.2* 14.4  HCT 49.2* 45.1  MCV 90.9 91.9  PLT 252 209   Cardiac Enzymes: No results found for this basename: CKTOTAL, CKMB, CKMBINDEX, TROPONINI,  in the last 168 hours BNP: No  components found with this basename: POCBNP,  CBG: No results found for this basename: GLUCAP,  in the last 168 hours  MRSA PCR SCREENING     Status: None   Collection Time    12/13/13  7:08 AM      Result Value Ref Range Status   MRSA by PCR NEGATIVE  NEGATIVE Final  RESPIRATORY VIRUS PANEL     Status: None   Collection Time    12/13/13  8:14 AM      Result Value Ref Range Status   Source - RVPAN NASAL SWAB   Corrected   Comment: CORRECTED ON 10/14 AT 2048: PREVIOUSLY REPORTED AS NASAL SWAB   Respiratory Syncytial Virus A NOT DETECTED   Final   Respiratory Syncytial Virus B NOT DETECTED   Final   Influenza A NOT DETECTED   Final   Influenza B NOT DETECTED   Final   Parainfluenza 1 NOT DETECTED   Final   Parainfluenza 2 NOT DETECTED   Final   Parainfluenza 3 NOT DETECTED   Final   Metapneumovirus NOT DETECTED   Final   Rhinovirus NOT DETECTED   Final   Adenovirus NOT DETECTED   Final   Influenza A H1 NOT DETECTED   Final   Influenza A H3 NOT DETECTED   Final     Scheduled Meds: . antiseptic oral rinse  7 mL Mouth Rinse q12n4p  . aspirin EC  81 mg Oral Daily  . chlorhexidine  15 mL Mouth Rinse BID  . cholecalciferol  5,000 Units Oral Daily  . dicyclomine  20 mg Oral TID AC  . doxycycline  100 mg Oral Q12H  . heparin  5,000 Units Subcutaneous 3 times per day  . ipratropium-albuterol  3 mL Nebulization TID  . lactose free nutrition  237 mL Oral TID WC  . methylPREDNISolone   40 mg Intravenous Q12H  . mirtazapine  15 mg Oral QHS  . pantoprazole  40 mg Oral Q0600   Continuous Infusions: . sodium chloride Stopped (12/16/13 1030)

## 2013-12-16 NOTE — Progress Notes (Signed)
At 1455 pt did not want neb tx at this time c/o wanting to eat her meal.

## 2013-12-17 LAB — CBC
HEMATOCRIT: 45.6 % (ref 36.0–46.0)
HEMOGLOBIN: 14.3 g/dL (ref 12.0–15.0)
MCH: 28.9 pg (ref 26.0–34.0)
MCHC: 31.4 g/dL (ref 30.0–36.0)
MCV: 92.1 fL (ref 78.0–100.0)
Platelets: 210 10*3/uL (ref 150–400)
RBC: 4.95 MIL/uL (ref 3.87–5.11)
RDW: 15 % (ref 11.5–15.5)
WBC: 9.9 10*3/uL (ref 4.0–10.5)

## 2013-12-17 LAB — BASIC METABOLIC PANEL
Anion gap: 7 (ref 5–15)
BUN: 15 mg/dL (ref 6–23)
CO2: 38 mEq/L — ABNORMAL HIGH (ref 19–32)
Calcium: 9.1 mg/dL (ref 8.4–10.5)
Chloride: 97 mEq/L (ref 96–112)
Creatinine, Ser: 0.56 mg/dL (ref 0.50–1.10)
GFR calc Af Amer: >90 mL/min (ref >90)
GFR calc non Af Amer: >90 mL/min (ref >90)
GLUCOSE: 101 mg/dL — AB (ref 70–99)
POTASSIUM: 3.5 meq/L — AB (ref 3.7–5.3)
Sodium: 142 mEq/L (ref 137–147)

## 2013-12-17 MED ORDER — POTASSIUM CHLORIDE CRYS ER 20 MEQ PO TBCR
40.0000 meq | EXTENDED_RELEASE_TABLET | Freq: Once | ORAL | Status: AC
  Administered 2013-12-17: 40 meq via ORAL
  Filled 2013-12-17 (×2): qty 2

## 2013-12-17 MED ORDER — HYDROMORPHONE HCL 1 MG/ML IJ SOLN
1.0000 mg | INTRAMUSCULAR | Status: DC | PRN
  Administered 2013-12-17 – 2013-12-20 (×9): 1 mg via INTRAVENOUS
  Filled 2013-12-17 (×10): qty 1

## 2013-12-17 MED ORDER — TIOTROPIUM BROMIDE MONOHYDRATE 18 MCG IN CAPS
18.0000 ug | ORAL_CAPSULE | Freq: Every day | RESPIRATORY_TRACT | Status: DC
  Administered 2013-12-18 – 2013-12-20 (×3): 18 ug via RESPIRATORY_TRACT
  Filled 2013-12-17: qty 5

## 2013-12-17 NOTE — Progress Notes (Addendum)
Patient ID: [name], female   DOB: 11-28-1960, 53 y.o.   MRN: [id number] [facility] PROGRESS NOTE  [name] XYB:[id number] DOB: [birth year] DOA: 12/13/2013 PCP: Kaleen Mask, MD  Brief narrative: 54 y.o. year old female with COPD, chronic resp failure-no longer on continuous O2, tobacco abuse, VDRF in January 2015 secondary to metapneumovirus, bipolar depression, hx/o TIA, recently treated with Augmentin for sinusitis, presented with dyspnea, productive cough of yellow sputum, fevers. Upon initial evaluation in ED, found to be in acute hypoxic respiratory failure with oxygen saturation in 80's on RA, requiring BiPAP.    Assessment/Plan:   Principal Problem:  Acute respiratory failure with hypoxia and hypercapnia  Secondary to acute COPD exacerbation, bronchitis.  Patient feels better but still requires [place] oxygen support to keep O2 sats above 90%. Continue with solumedrol today and then switch to prednisone PO taper. Continue duoneb every 2 hours PRN shortness of breath or wheezing, continue duoneb every 4 hours scheduled. Added spiriva daily per home regimen. Continue doxycycline. Active Problems:  Acute COPD exacerbation / Acute bronchitis  COPD gold alert ordered. Continue with nebulizer treatments as needed and scheduled. Continue steroids and doxycycline.   for oxygen support. Dehydration  Improved with IV fluids. BP stable, 136/69. Mild protein calorie malnutrition  Pre-albumin on lower end of normal. BMI is appropriate, 19. Has relatively good po intake.  Decrease IV fluids to KVO to prevent fluid overload. DVT Prophylaxis  Stop heparin and use SCD's (pt wanted to minimize subQ inj)  GI prophylaxis  On protonix  Code Status: Full.  Family Communication: plan of care discussed with the patient  Disposition Plan: transfer to medical floor today.    IV Access:   Peripheral IV Procedures and diagnostic studies:    CXR 12/13/2013 Hyperexpanded  lungs without acute cardiopulmonary disease.   Medical Consultants:   None   Other Consultants:   None   Anti-Infectives:   Doxycycline 10/14 -->   Bethany Passey, MD  [facility] Pager [phone]  If 7PM-7AM, please contact night-coverage [email or web address] Password [email or web address] 12/17/2013, 7:29 AM   LOS: 4 days    HPI/Subjective: No acute overnight events.  Objective: Filed Vitals:   12/16/13 1628 12/16/13 2042 12/16/13 2245 12/17/13 0505  BP:  162/76  119/74  Pulse:  118  98  Temp:  98 F (36.7 C)  97.4 F (36.3 C)  TempSrc:  Oral  Oral  Resp:  20  20  Height:      Weight:      SpO2: 95% 94% 92% 97%    Intake/Output Summary (Last 24 hours) at 12/17/13 0729 Last data filed at 12/16/13 1823  Gross per 24 hour  Intake    525 ml  Output      0 ml  Net    525 ml    Exam:   General:  Pt is alert, follows commands appropriately, not in acute distress  Cardiovascular: Regular rate and rhythm, S1/S2, no murmurs  Respiratory: bilateral air entry, no wheezing   Abdomen: Soft, non tender, non distended, bowel sounds present  Extremities: No edema, pulses DP and PT palpable bilaterally  Neuro: Grossly nonfocal  Data Reviewed: Basic Metabolic Panel:  Recent Labs Lab 12/13/13 0130 12/14/13 0320 12/17/13 0550  NA 138 134* 142  K 4.6 4.4 3.5*  CL 91* 89* 97  CO2 35* 35* 38*  GLUCOSE 133* 126* 101*  BUN 5* 10 15  CREATININE 0.41* 0.50 0.56  CALCIUM 10.0 9.4 9.1   Liver  Function Tests: No results found for this basename: AST, ALT, ALKPHOS, BILITOT, PROT, ALBUMIN,  in the last 168 hours No results found for this basename: LIPASE, AMYLASE,  in the last 168 hours No results found for this basename: AMMONIA,  in the last 168 hours CBC:  Recent Labs Lab 12/13/13 0130 12/14/13 0320 12/17/13 0550  WBC 9.1 8.8 9.9  HGB 16.2* 14.4 14.3  HCT 49.2* 45.1 45.6  MCV 90.9 91.9 92.1  PLT 252 209 210   Cardiac Enzymes: No results found for this basename:  CKTOTAL, CKMB, CKMBINDEX, TROPONINI,  in the last 168 hours BNP: No components found with this basename: POCBNP,  CBG: No results found for this basename: GLUCAP,  in the last 168 hours  Recent Results (from the past 240 hour(s))  MRSA PCR SCREENING     Status: None   Collection Time    12/13/13  7:08 AM      Result Value Ref Range Status   MRSA by PCR NEGATIVE  NEGATIVE Final   Comment:            The GeneXpert MRSA Assay (FDA     approved for NASAL specimens     only), is one component of a     comprehensive MRSA colonization     surveillance program. It is not     intended to diagnose MRSA     infection nor to guide or     monitor treatment for     MRSA infections.  RESPIRATORY VIRUS PANEL     Status: None   Collection Time    12/13/13  8:14 AM      Result Value Ref Range Status   Source - RVPAN NASAL SWAB   Corrected   Comment: CORRECTED ON 10/14 AT 2048: PREVIOUSLY REPORTED AS NASAL SWAB   Respiratory Syncytial Virus A NOT DETECTED   Final   Respiratory Syncytial Virus B NOT DETECTED   Final   Influenza A NOT DETECTED   Final   Influenza B NOT DETECTED   Final   Parainfluenza 1 NOT DETECTED   Final   Parainfluenza 2 NOT DETECTED   Final   Parainfluenza 3 NOT DETECTED   Final   Metapneumovirus NOT DETECTED   Final   Rhinovirus NOT DETECTED   Final   Adenovirus NOT DETECTED   Final   Influenza A H1 NOT DETECTED   Final   Influenza A H3 NOT DETECTED   Final   Comment: (NOTE)           Normal Reference Range for each Analyte: NOT DETECTED     Testing performed using the Luminex xTAG Respiratory Viral Panel test     kit.     The analytical performance characteristics of this assay have been     determined by [facility].  The modifications have not been     cleared or approved by the FDA. This assay has been validated pursuant     to the CLIA regulations and is used for clinical purposes.     Performed at [facility]     Scheduled Meds: . aspirin  EC  81 mg Oral Daily  . cholecalciferol  5,000 Units Oral Daily  . dicyclomine  20 mg Oral TID AC  . doxycycline  100 mg Oral Q12H  . heparin  5,000 Units Subcutaneous 3 times per day  . ipratropium-albuterol  3 mL Nebulization TID  . lactose free nutrition  237 mL Oral TID  WC  . methylPREDNISolone (SOLU-MEDROL) injection  40 mg Intravenous Q24H  . mirtazapine  15 mg Oral QHS  . pantoprazole  40 mg Oral Q0600  . potassium chloride  40 mEq Oral Once   Continuous Infusions: . sodium chloride Stopped (12/16/13 1030)

## 2013-12-18 MED ORDER — DM-GUAIFENESIN ER 30-600 MG PO TB12
1.0000 | ORAL_TABLET | Freq: Two times a day (BID) | ORAL | Status: DC
  Administered 2013-12-18 – 2013-12-20 (×5): 1 via ORAL
  Filled 2013-12-18 (×6): qty 1

## 2013-12-18 MED ORDER — LORAZEPAM 2 MG/ML IJ SOLN
1.0000 mg | Freq: Three times a day (TID) | INTRAMUSCULAR | Status: DC | PRN
  Administered 2013-12-19 – 2013-12-20 (×4): 1 mg via INTRAVENOUS
  Filled 2013-12-18 (×4): qty 1

## 2013-12-18 MED ORDER — PREDNISONE 50 MG PO TABS
50.0000 mg | ORAL_TABLET | Freq: Every day | ORAL | Status: DC
  Administered 2013-12-19: 50 mg via ORAL
  Filled 2013-12-18 (×2): qty 1

## 2013-12-18 MED ORDER — DIAZEPAM 5 MG PO TABS
10.0000 mg | ORAL_TABLET | Freq: Two times a day (BID) | ORAL | Status: DC | PRN
Start: 2013-12-18 — End: 2013-12-18
  Administered 2013-12-18: 10 mg via ORAL
  Filled 2013-12-18: qty 2

## 2013-12-18 MED ORDER — DOCUSATE SODIUM 100 MG PO CAPS
100.0000 mg | ORAL_CAPSULE | Freq: Two times a day (BID) | ORAL | Status: DC
  Administered 2013-12-18 – 2013-12-20 (×5): 100 mg via ORAL
  Filled 2013-12-18 (×6): qty 1

## 2013-12-18 NOTE — Progress Notes (Signed)
Occupational Therapy Treatment Patient Details Name: [name] MRN: [id number] DOB: [birth year] Today's Date: 12/18/2013    History of present illness 53 yo female admitted with acute resp failure with hypoxia and hypercapnia. Hx of COPD, TIA, CVA, PTSD, bipolar d/o, Sz.    OT comments  Educated pt further on adaptive equipment and practiced with pieces for self care tasks. Also discussed energy conservation techniques including use of AE and importance of pacing self.  Will continue to follow.   Follow Up Recommendations  Home health OT;Supervision - Intermittent    Equipment Recommendations  3 in 1 bedside comode    Recommendations for Other Services      Precautions / Restrictions Precautions Precautions: Fall Precaution Comments: watch sats       Mobility Bed Mobility               General bed mobility comments: at EOB  Transfers Overall transfer level: Needs assistance Equipment used: None Transfers: Sit to/from Stand Sit to Stand: Supervision Stand pivot transfers: Supervision            Balance                                   ADL                       Lower Body Dressing: Supervision/safety;Sitting/lateral leans   Toilet Transfer: Supervision/safety;Stand-pivot (no device)             General ADL Comments: Pt states she reviewed energy conservation handout and doesnt have questions at this time. Demonstrated all AE for LB self care and though pt can cross her LEs up she states this is sometimes uncomfortable with her fibromyalgia and history of colitis. She states she would like the AE and issued it to pt. She practiced with reacher to doff socks, sock aid to don sock, LHS to wash at feet and shoe horn to don shoes. She states AE does make the task easier. Discussed importance of OOB activtiy with pt and pt agreeable to sit up in chaiir.       Vision                     Perception     Praxis       Cognition   Behavior During Therapy: Anxious (requesting breathing treatment. Informed nursing who called respiratory) Overall Cognitive Status: Within Functional Limits for tasks assessed                       Extremity/Trunk Assessment               Exercises     Shoulder Instructions       General Comments      Pertinent Vitals/ Pain       Pain Assessment: No/denies pain  Home Living                                          Prior Functioning/Environment              Frequency Min 2X/week     Progress Toward Goals  OT Goals(current goals can now be found in the care plan section)  Progress towards OT goals: Progressing toward goals  Plan Discharge plan remains appropriate    Co-evaluation                 End of Session Equipment Utilized During Treatment: Oxygen   Activity Tolerance Patient tolerated treatment well   Patient Left in chair;with call bell/phone within reach   Nurse Communication          Time: 2563-8937 OT Time Calculation (min): 25 min  Charges: OT General Charges $OT Visit: 1 Procedure OT Treatments $Self Care/Home Management : 8-22 mins $Therapeutic Activity: 8-22 mins  Lennox Laity [phone] 12/18/2013, 9:59 AM

## 2013-12-18 NOTE — Progress Notes (Addendum)
Patient ID: [name], female   DOB: 1961/02/07, 53 y.o.   MRN: [id number] [facility] PROGRESS NOTE  ALEAHA FICKLING HFW:[id number] DOB: May 01, 1960 DOA: 12/13/2013 PCP: Leonard Downing, MD  Brief narrative: 53 y.o. year old female with COPD, chronic resp failure-no longer on continuous O2, tobacco abuse, VDRF in January 2015 secondary to metapneumovirus, bipolar depression, hx/o TIA, recently treated with Augmentin for sinusitis, presented with dyspnea, productive cough of yellow sputum, fevers. Upon initial evaluation in ED, found to be in acute hypoxic respiratory failure with oxygen saturation in 80's on RA, requiring BiPAP. Her respiratory status has improved since admission but patient still feels congested and weak.  Assessment/Plan:    Principal Problem:  Acute respiratory failure with hypoxia and hypercapnia  Secondary to acute COPD exacerbation, bronchitis. Feels better this am but congested. Added flutter valve and mucinex BID to help mobilize secretions. Continue oxygen support via [place] to keep O2 sats above 90%. Switch to prednisone 50 mg daily starting 12/19/2013.  Continue current BD, duoneb every 2 hours PRN shortness of breath or wheezing, continue duoneb every 4 hours scheduled. Added spiriva daily per home regimen.  Continue doxycycline. Active Problems:  Acute COPD exacerbation / Acute bronchitis  Continue with nebulizer treatments as needed and scheduled. Continue steroids and doxycycline.  [place] for oxygen support. Dehydration  Improved with IV fluids.  Mild protein calorie malnutrition  Pre-albumin on lower end of normal. BMI is appropriate, 19. Has relatively good po intake.  DVT Prophylaxis  Stopped heparin and use SCD's (pt wanted to minimize subQ inj)  GI prophylaxis   On protonix   Code Status: Full.  Family Communication: plan of care discussed with the patient  Disposition Plan: home when stable  IV Access:   Peripheral IV  Procedures and  diagnostic studies:    CXR 12/13/2013 Hyperexpanded lungs without acute cardiopulmonary disease.   Medical Consultants:   None   Other Consultants:   None   Anti-Infectives:   Doxycycline 10/14 -->   Leisa Lenz, MD  [facility] Pager [phone]  If 7PM-7AM, please contact night-coverage [email or web address] Password [email or web address] 12/18/2013, 1:21 PM   LOS: 5 days    HPI/Subjective: No acute overnight events.  Objective: Filed Vitals:   12/17/13 2140 12/18/13 0513 12/18/13 0718 12/18/13 0953  BP: 142/83 144/77    Pulse: 104 86    Temp: 97.6 F (36.4 C) 97.8 F (36.6 C)    TempSrc: Oral Oral    Resp: 18 18    Height:      Weight:   56.564 kg (124 lb 11.2 oz)   SpO2: 97% 98%  96%    Intake/Output Summary (Last 24 hours) at 12/18/13 1321 Last data filed at 12/18/13 1045  Gross per 24 hour  Intake    360 ml  Output      0 ml  Net    360 ml    Exam:   General:  Pt is alert, follows commands appropriately, not in acute distress  Cardiovascular: Regular rate and rhythm, S1/S2, no murmurs  Respiratory: Clear to auscultation bilaterally, no wheezing, no crackles, no rhonchi  Abdomen: Soft, non tender, non distended, bowel sounds present  Extremities: No edema, pulses DP and PT palpable bilaterally  Neuro: Grossly nonfocal  Data Reviewed: Basic Metabolic Panel:  Recent Labs Lab 12/13/13 0130 12/14/13 0320 12/17/13 0550  NA 138 134* 142  K 4.6 4.4 3.5*  CL 91* 89* 97  CO2 35* 35* 38*  GLUCOSE 133* 126* 101*  BUN 5* 10 15  CREATININE 0.41* 0.50 0.56  CALCIUM 10.0 9.4 9.1   Liver Function Tests: No results found for this basename: AST, ALT, ALKPHOS, BILITOT, PROT, ALBUMIN,  in the last 168 hours No results found for this basename: LIPASE, AMYLASE,  in the last 168 hours No results found for this basename: AMMONIA,  in the last 168 hours CBC:  Recent Labs Lab 12/13/13 0130 12/14/13 0320 12/17/13 0550  WBC 9.1 8.8 9.9  HGB 16.2* 14.4 14.3  HCT  49.2* 45.1 45.6  MCV 90.9 91.9 92.1  PLT 252 209 210   Cardiac Enzymes: No results found for this basename: CKTOTAL, CKMB, CKMBINDEX, TROPONINI,  in the last 168 hours BNP: No components found with this basename: POCBNP,  CBG: No results found for this basename: GLUCAP,  in the last 168 hours  Recent Results (from the past 240 hour(s))  MRSA PCR SCREENING     Status: None   Collection Time    12/13/13  7:08 AM      Result Value Ref Range Status   MRSA by PCR NEGATIVE  NEGATIVE Final   Comment:            The GeneXpert MRSA Assay (FDA     approved for NASAL specimens     only), is one component of a     comprehensive MRSA colonization     surveillance program. It is not     intended to diagnose MRSA     infection nor to guide or     monitor treatment for     MRSA infections.  RESPIRATORY VIRUS PANEL     Status: None   Collection Time    12/13/13  8:14 AM      Result Value Ref Range Status   Source - RVPAN NASAL SWAB   Corrected   Comment: CORRECTED ON 10/14 AT 2048: PREVIOUSLY REPORTED AS NASAL SWAB   Respiratory Syncytial Virus A NOT DETECTED   Final   Respiratory Syncytial Virus B NOT DETECTED   Final   Influenza A NOT DETECTED   Final   Influenza B NOT DETECTED   Final   Parainfluenza 1 NOT DETECTED   Final   Parainfluenza 2 NOT DETECTED   Final   Parainfluenza 3 NOT DETECTED   Final   Metapneumovirus NOT DETECTED   Final   Rhinovirus NOT DETECTED   Final   Adenovirus NOT DETECTED   Final   Influenza A H1 NOT DETECTED   Final   Influenza A H3 NOT DETECTED   Final   Comment: (NOTE)           Normal Reference Range for each Analyte: NOT DETECTED     Testing performed using the Luminex xTAG Respiratory Viral Panel test     kit.     The analytical performance characteristics of this assay have been     determined by [facility].  The modifications have not been     cleared or approved by the FDA. This assay has been validated pursuant     to the CLIA  regulations and is used for clinical purposes.     Performed at [facility]     Scheduled Meds: . antiseptic oral rinse  7 mL Mouth Rinse q12n4p  . aspirin EC  81 mg Oral Daily  . chlorhexidine  15 mL Mouth Rinse BID  . cholecalciferol  5,000 Units Oral Daily  . dicyclomine  20 mg Oral TID AC  .  docusate sodium  100 mg Oral BID  . doxycycline  100 mg Oral Q12H  . ipratropium-albuterol  3 mL Nebulization TID  . lactose free nutrition  237 mL Oral TID WC  . methylPREDNISolone (SOLU-MEDROL) injection  40 mg Intravenous Q24H  . mirtazapine  15 mg Oral QHS  . pantoprazole  40 mg Oral Q0600  . sodium chloride  3 mL Intravenous Q12H  . tiotropium  18 mcg Inhalation Daily   Continuous Infusions: . sodium chloride 10 mL/hr at 12/18/13 1154

## 2013-12-18 NOTE — Progress Notes (Signed)
PT Cancellation Note  Patient Details Name: [name] MRN: [id number] DOB: 1960/03/07   Cancelled Treatment:    Reason Eval/Treat Not Completed: Other (comment) (c/r d/t fatigue); pt later observed amb to door of her room with IV pole   [facility] LLCWILLIAMS,[name] 12/18/2013, 3:38 PM

## 2013-12-19 MED ORDER — PREDNISONE 10 MG PO TABS: 10.0000 mg | ORAL_TABLET | Freq: Every day | ORAL | Status: DC

## 2013-12-19 MED ORDER — DOXYCYCLINE HYCLATE 100 MG PO TABS
100.0000 mg | ORAL_TABLET | Freq: Two times a day (BID) | ORAL | Status: AC
  Administered 2013-12-19 (×2): 100 mg via ORAL
  Filled 2013-12-19 (×2): qty 1

## 2013-12-19 MED ORDER — PREDNISONE 20 MG PO TABS
40.0000 mg | ORAL_TABLET | Freq: Every day | ORAL | Status: AC
  Administered 2013-12-20: 40 mg via ORAL
  Filled 2013-12-19: qty 2

## 2013-12-19 MED ORDER — PREDNISONE 20 MG PO TABS: 20.0000 mg | ORAL_TABLET | Freq: Every day | ORAL | Status: DC

## 2013-12-19 MED ORDER — PREDNISONE 20 MG PO TABS
30.0000 mg | ORAL_TABLET | Freq: Every day | ORAL | Status: DC
  Filled 2013-12-19: qty 1

## 2013-12-19 MED ORDER — POTASSIUM CHLORIDE CRYS ER 20 MEQ PO TBCR: 40.0000 meq | EXTENDED_RELEASE_TABLET | Freq: Once | ORAL | Status: DC

## 2013-12-19 NOTE — Progress Notes (Signed)
Patient refuses to have bed alarm on. States "doctor wants me to be able to get up on my own and didn't say I need it." Patient educated on use of bed alarm and that she needs someone in there when she gets up since she is a fall risk. Patient was still uncooperative and refused to have it on. Bed alarm was left off and RN Darral Dashsther was notified.

## 2013-12-19 NOTE — Care Management Note (Addendum)
    Page 1 of 2   12/20/2013     1:26:14 PM CARE MANAGEMENT NOTE 12/20/2013  Patient:  [name], [name]   Account Number:  [id number]  Date Initiated:  12/19/2013  Documentation initiated by:  [facility]  Subjective/Objective Assessment:   adm: Acute respiratory failure with hypoxia and hypercapnia     Action/Plan:   discharge planning   Anticipated DC Date:  12/19/2013   Anticipated DC Plan:  [place]  CM consult      [facility] Choice  DURABLE MEDICAL EQUIPMENT   Choice offered to / List presented to:  C-1 Patient   DME arranged  [facility]  3-N-1  Vassie Moselle      DME agency  [place] arranged  HH-1 RN      [place].   Status of service:  Completed, signed off Medicare Important Message given?   (If response is "NO", the following Medicare IM given date fields will be blank) Date Medicare IM given:   Medicare IM given by:   Date Additional Medicare IM given:   Additional Medicare IM given by:    Discharge Disposition:  [place]  Per UR Regulation:    If discussed at Long Length of Stay Meetings, dates discussed:    Comments:  12/20/13 13:20 Referral completed for DME 3n1 and rw with [facility] and HHRN with [facility].  No other CM needs were communicated.  Mariane Masters, BSN, CM [phone]. 12:00 CM met with pt in room to ask about her home O2.  Pt states she had PRN O2 in [place] of this year and would like to continue with [facility].  Referral called to [place], Leafy Ro [phone] who requests I fax facesheet, order, and pulmonary saturation note to [phone]. Leafy Ro will deliver the O2 to pt's room today prior to discharge.  CM requested DME orders for 3n1 and rolling walker to be delivered to room prior todischarge and [place], DME rep, states she will deliver. Pt has been ordered HHPT but because she does NOT meet criteria for coverage for HHPT  (verified with [facility]).  MD made aware of preceeding information.  No other CM needs were communicated. Pt states she is  active with [facility] (via [facility]) and this will be resumed by pt when she gets home.  Mariane Masters, BSN, Jearld Lesch [phone].   12/19/13 10:15 CM waiting for PT eval and will request [facility] services per recc.  Will continue to monitor.  Mariane Masters, BSN, CM [phone].

## 2013-12-19 NOTE — Progress Notes (Addendum)
Patient ID: [name], female   DOB: 08/21/1960, 53 y.o.   MRN: [id number]  [facility] PROGRESS NOTE  [name] MRN:[id number] DOB: 12/17/1960 DOA: 12/13/2013 PCP: ELKINS,WILSON OLIVER, MD  Brief narrative:  53 y.o. year old female with COPD, chronic resp failure-no longer on continuous O2, tobacco abuse, VDRF in January 2015 secondary to metapneumovirus, bipolar depression, hx/o TIA, recently treated with Augmentin for sinusitis, presented with dyspnea, productive cough of yellow sputum, fevers. Upon initial evaluation in ED, found to be in acute hypoxic respiratory failure with oxygen saturation in 80's on RA, requiring BiPAP. Her respiratory status has improved since admission but patient still feels congested and weak.   Assessment/Plan:   Principal Problem:  Acute respiratory failure with hypoxia and hypercapnia  Secondary to acute COPD exacerbation, bronchitis. Feels better this am but still congested.  Continue flutter valve and mucinex BID to help mobilize secretions.  Continue oxygen support via [place] to keep O2 sats above 90%.  Switched to prednisone 50 mg daily starting today, taper down by 10 mg a day down to 0 mg.  Continue nebulizer treatments every 4 hours scheduled and every 6 hours as needed. Resume home dose spiriva.   Has completed 7 days of doxycycline today so will stop doxycycline today.  Active Problems:  Acute COPD exacerbation / Acute bronchitis  Continue with nebulizer treatments as needed and scheduled. Continue steroids and doxycycline.  [place] for oxygen support. Hypokalemia  Secondary to albuterol affect  Supplemented. Dehydration  Improved with IV fluids.  Tolerating regular diet well  Mild protein calorie malnutrition  Pre-albumin on lower end of normal. BMI is appropriate, 19. Has relatively good po intake.  DVT Prophylaxis  Stopped heparin and use SCD's (pt wanted to minimize subQ inj)  GI prophylaxis   On protonix  Code Status: Full.   Family Communication: plan of care discussed with the patient  Disposition Plan: home when stable   IV Access:   Peripheral IV Procedures and diagnostic studies:    CXR 12/13/2013 Hyperexpanded lungs without acute cardiopulmonary disease.  Medical Consultants:   None  Other Consultants:   None  Anti-Infectives:   Doxycycline 10/14 --> 12/19/2013  DEVINE, ALMA, MD  [facility] Pager [phone]  If 7PM-7AM, please contact night-coverage [email or web address] Password [email or web address] 12/19/2013, 7:16 AM   LOS: 6 days    HPI/Subjective: No acute overnight events.  Objective: Filed Vitals:   12/18/13 1516 12/18/13 2120 12/18/13 2156 12/19/13 0539  BP: 133/75  140/64 122/68  Pulse: 87  100 83  Temp: 98 F (36.7 C)  98 F (36.7 C) 97.8 F (36.6 C)  TempSrc: Axillary  Oral Oral  Resp: 18  18 18  Height:      Weight:      SpO2: 92% 95% 92% 99%    Intake/Output Summary (Last 24 hours) at 12/19/13 0716 Last data filed at 12/18/13 1045  Gross per 24 hour  Intake    120 ml  Output      0 ml  Net    120 ml    Exam:   General:  Pt is alert, follows commands appropriately, not in acute distress  Cardiovascular: Regular rate and rhythm, S1/S2, no murmurs  Respiratory: Clear to auscultation bilaterally, diminished breath sounds at bases  Abdomen: Soft, non tender, non distended, bowel sounds present  Extremities: No edema, pulses DP and PT palpable bilaterally  Neuro: Grossly nonfocal  Data Reviewed: Basic Metabolic Panel:  Recent Labs Lab 12/13/13 0130 12/14/13 0320   12/17/13 0550  NA 138 134* 142  K 4.6 4.4 3.5*  CL 91* 89* 97  CO2 35* 35* 38*  GLUCOSE 133* 126* 101*  BUN 5* 10 15  CREATININE 0.41* 0.50 0.56  CALCIUM 10.0 9.4 9.1   CBC:  Recent Labs Lab 12/13/13 0130 12/14/13 0320 12/17/13 0550  WBC 9.1 8.8 9.9  HGB 16.2* 14.4 14.3  HCT 49.2* 45.1 45.6  MCV 90.9 91.9 92.1  PLT 252 209 210    Recent Results (from the past 240 hour(s))  MRSA PCR  SCREENING     Status: None   Collection Time    12/13/13  7:08 AM      Result Value Ref Range Status   MRSA by PCR NEGATIVE  NEGATIVE Final   Comment:            The GeneXpert MRSA Assay (FDA     approved for NASAL specimens     only), is one component of a     comprehensive MRSA colonization     surveillance program. It is not     intended to diagnose MRSA     infection nor to guide or     monitor treatment for     MRSA infections.  RESPIRATORY VIRUS PANEL     Status: None   Collection Time    12/13/13  8:14 AM      Result Value Ref Range Status   Source - RVPAN NASAL SWAB   Corrected   Comment: CORRECTED ON 10/14 AT 2048: PREVIOUSLY REPORTED AS NASAL SWAB   Respiratory Syncytial Virus A NOT DETECTED   Final   Respiratory Syncytial Virus B NOT DETECTED   Final   Influenza A NOT DETECTED   Final   Influenza B NOT DETECTED   Final   Parainfluenza 1 NOT DETECTED   Final   Parainfluenza 2 NOT DETECTED   Final   Parainfluenza 3 NOT DETECTED   Final   Metapneumovirus NOT DETECTED   Final   Rhinovirus NOT DETECTED   Final   Adenovirus NOT DETECTED   Final   Influenza A H1 NOT DETECTED   Final   Influenza A H3 NOT DETECTED   Final   Comment: (NOTE)           Normal Reference Range for each Analyte: NOT DETECTED     Testing performed using the Luminex xTAG Respiratory Viral Panel test     kit.     The analytical performance characteristics of this assay have been     determined by [facility].  The modifications have not been     cleared or approved by the FDA. This assay has been validated pursuant     to the CLIA regulations and is used for clinical purposes.     Performed at [facility]     Scheduled Meds: . aspirin EC  81 mg Oral Daily  . dicyclomine  20 mg Oral TID AC  . docusate sodium  100 mg Oral BID  . doxycycline  100 mg Oral Q12H  . ipratropium-albuterol  3 mL Nebulization TID  . mirtazapine  15 mg Oral QHS  . pantoprazole  40 mg Oral Q0600  .  predniSONE  50 mg Oral Q breakfast  . tiotropium  18 mcg Inhalation Daily   Continuous Infusions: . sodium chloride 10 mL/hr at 12/18/13 1154         

## 2013-12-20 LAB — CBC
HCT: 46.8 % — ABNORMAL HIGH (ref 36.0–46.0)
HEMOGLOBIN: 15 g/dL (ref 12.0–15.0)
MCH: 28.9 pg (ref 26.0–34.0)
MCHC: 32.1 g/dL (ref 30.0–36.0)
MCV: 90.2 fL (ref 78.0–100.0)
Platelets: 237 10*3/uL (ref 150–400)
RBC: 5.19 MIL/uL — AB (ref 3.87–5.11)
RDW: 15.1 % (ref 11.5–15.5)
WBC: 14.5 10*3/uL — ABNORMAL HIGH (ref 4.0–10.5)

## 2013-12-20 LAB — BASIC METABOLIC PANEL
Anion gap: 13 (ref 5–15)
BUN: 9 mg/dL (ref 6–23)
CALCIUM: 10.2 mg/dL (ref 8.4–10.5)
CO2: 34 meq/L — AB (ref 19–32)
Chloride: 96 mEq/L (ref 96–112)
Creatinine, Ser: 0.55 mg/dL (ref 0.50–1.10)
GFR calc Af Amer: >90 mL/min (ref >90)
GFR calc non Af Amer: >90 mL/min (ref >90)
GLUCOSE: 101 mg/dL — AB (ref 70–99)
Potassium: 3.8 mEq/L (ref 3.7–5.3)
Sodium: 143 mEq/L (ref 137–147)

## 2013-12-20 MED ORDER — CETYLPYRIDINIUM CHLORIDE 0.05 % MT LIQD: 7.0000 mL | Freq: Two times a day (BID) | OROMUCOSAL | Status: DC

## 2013-12-20 MED ORDER — CYCLOBENZAPRINE HCL 5 MG PO TABS
5.0000 mg | ORAL_TABLET | Freq: Three times a day (TID) | ORAL | Status: DC | PRN
Start: 2013-12-20 — End: 2014-04-25

## 2013-12-20 MED ORDER — IPRATROPIUM-ALBUTEROL 0.5-2.5 (3) MG/3ML IN SOLN: 3.0000 mL | Freq: Two times a day (BID) | RESPIRATORY_TRACT | Status: DC

## 2013-12-20 MED ORDER — PREDNISONE 10 MG PO TABS: 30.0000 mg | ORAL_TABLET | Freq: Every day | ORAL | Status: DC

## 2013-12-20 MED ORDER — PROMETHAZINE HCL 25 MG PO TABS: 12.5000 mg | ORAL_TABLET | Freq: Four times a day (QID) | ORAL | Status: DC | PRN

## 2013-12-20 MED ORDER — MIRTAZAPINE 15 MG PO TABS: 15.0000 mg | ORAL_TABLET | Freq: Every day | ORAL | Status: DC

## 2013-12-20 MED ORDER — CHLORHEXIDINE GLUCONATE 0.12 % MT SOLN: 15.0000 mL | Freq: Two times a day (BID) | OROMUCOSAL | Status: DC

## 2013-12-20 MED ORDER — DSS 100 MG PO CAPS: 100.0000 mg | ORAL_CAPSULE | Freq: Every day | ORAL | Status: DC | PRN

## 2013-12-20 MED ORDER — ALBUTEROL SULFATE (2.5 MG/3ML) 0.083% IN NEBU: 2.5000 mg | INHALATION_SOLUTION | Freq: Two times a day (BID) | RESPIRATORY_TRACT | Status: DC

## 2013-12-20 MED ORDER — OXYCODONE HCL 15 MG PO TABS: 15.0000 mg | ORAL_TABLET | Freq: Four times a day (QID) | ORAL | Status: DC | PRN

## 2013-12-20 MED ORDER — DIAZEPAM 10 MG PO TABS: 10.0000 mg | ORAL_TABLET | Freq: Two times a day (BID) | ORAL | Status: DC | PRN

## 2013-12-20 MED ORDER — ALBUTEROL SULFATE (2.5 MG/3ML) 0.083% IN NEBU: 2.5000 mg | INHALATION_SOLUTION | Freq: Four times a day (QID) | RESPIRATORY_TRACT | Status: AC | PRN

## 2013-12-20 MED ORDER — DM-GUAIFENESIN ER 30-600 MG PO TB12: 1.0000 | ORAL_TABLET | Freq: Two times a day (BID) | ORAL | Status: DC | PRN

## 2013-12-20 NOTE — Discharge Instructions (Signed)
Chronic Obstructive Pulmonary Disease °Chronic obstructive pulmonary disease (COPD) is a common lung condition in which airflow from the lungs is limited. COPD is a general term that can be used to describe many different lung problems that limit airflow, including both chronic bronchitis and emphysema.  If you have COPD, your lung function will probably never return to normal, but there are measures you can take to improve lung function and make yourself feel better.  °CAUSES  °· Smoking (common).   °· Exposure to secondhand smoke.   °· Genetic problems. °· Chronic inflammatory lung diseases or recurrent infections. °SYMPTOMS  °· Shortness of breath, especially with physical activity.   °· Deep, persistent (chronic) cough with a large amount of thick mucus.   °· Wheezing.   °· Rapid breaths (tachypnea).   °· Gray or bluish discoloration (cyanosis) of the skin, especially in fingers, toes, or lips.   °· Fatigue.   °· Weight loss.   °· Frequent infections or episodes when breathing symptoms become much worse (exacerbations).   °· Chest tightness. °DIAGNOSIS  °Your health care provider will take a medical history and perform a physical examination to make the initial diagnosis.  Additional tests for COPD may include:  °· Lung (pulmonary) function tests. °· Chest X-ray. °· CT scan. °· Blood tests. °TREATMENT  °Treatment available to help you feel better when you have COPD includes:  °· Inhaler and nebulizer medicines. These help manage the symptoms of COPD and make your breathing more comfortable. °· Supplemental oxygen. Supplemental oxygen is only helpful if you have a low oxygen level in your blood.   °· Exercise and physical activity. These are beneficial for nearly all people with COPD. Some people may also benefit from a pulmonary rehabilitation program. °HOME CARE INSTRUCTIONS  °· Take all medicines (inhaled or pills) as directed by your health care provider. °· Avoid over-the-counter medicines or cough syrups  that dry up your airway (such as antihistamines) and slow down the elimination of secretions unless instructed otherwise by your health care provider.   °· If you are a smoker, the most important thing that you can do is stop smoking. Continuing to smoke will cause further lung damage and breathing trouble. Ask your health care provider for help with quitting smoking. He or she can direct you to community resources or hospitals that provide support. °· Avoid exposure to irritants such as smoke, chemicals, and fumes that aggravate your breathing. °· Use oxygen therapy and pulmonary rehabilitation if directed by your health care provider. If you require home oxygen therapy, ask your health care provider whether you should purchase a pulse oximeter to measure your oxygen level at home.   °· Avoid contact with individuals who have a contagious illness. °· Avoid extreme temperature and humidity changes. °· Eat healthy foods. Eating smaller, more frequent meals and resting before meals may help you maintain your strength. °· Stay active, but balance activity with periods of rest. Exercise and physical activity will help you maintain your ability to do things you want to do. °· Preventing infection and hospitalization is very important when you have COPD. Make sure to receive all the vaccines your health care provider recommends, especially the pneumococcal and influenza vaccines. Ask your health care provider whether you need a pneumonia vaccine. °· Learn and use relaxation techniques to manage stress. °· Learn and use controlled breathing techniques as directed by your health care provider. Controlled breathing techniques include:   °· Pursed lip breathing. Start by breathing in (inhaling) through your nose for 1 second. Then, purse your lips as if you were   going to whistle and breathe out (exhale) through the pursed lips for 2 seconds.   °· Diaphragmatic breathing. Start by putting one hand on your abdomen just above  your waist. Inhale slowly through your nose. The hand on your abdomen should move out. Then purse your lips and exhale slowly. You should be able to feel the hand on your abdomen moving in as you exhale.   °· Learn and use controlled coughing to clear mucus from your lungs. Controlled coughing is a series of short, progressive coughs. The steps of controlled coughing are:   °1. Lean your head slightly forward.   °2. Breathe in deeply using diaphragmatic breathing.   °3. Try to hold your breath for 3 seconds.   °4. Keep your mouth slightly open while coughing twice.   °5. Spit any mucus out into a tissue.   °6. Rest and repeat the steps once or twice as needed. °SEEK MEDICAL CARE IF:  °· You are coughing up more mucus than usual.   °· There is a change in the color or thickness of your mucus.   °· Your breathing is more labored than usual.   °· Your breathing is faster than usual.   °SEEK IMMEDIATE MEDICAL CARE IF:  °· You have shortness of breath while you are resting.   °· You have shortness of breath that prevents you from: °¨ Being able to talk.   °¨ Performing your usual physical activities.   °· You have chest pain lasting longer than 5 minutes.   °· Your skin color is more cyanotic than usual. °· You measure low oxygen saturations for longer than 5 minutes with a pulse oximeter. °MAKE SURE YOU:  °· Understand these instructions. °· Will watch your condition. °· Will get help right away if you are not doing well or get worse. °Document Released: 11/26/2004 Document Revised: 07/03/2013 Document Reviewed: 10/13/2012 °ExitCare® Patient Information ©2015 [employer]. This information is not intended to replace advice given to you by your health care provider. Make sure you discuss any questions you have with your health care provider. ° °Pneumonia °Pneumonia is an infection of the lungs.  °CAUSES °Pneumonia may be caused by bacteria or a virus. Usually, these infections are caused by breathing infectious  particles into the lungs (respiratory tract). °SIGNS AND SYMPTOMS  °· Cough. °· Fever. °· Chest pain. °· Increased rate of breathing. °· Wheezing. °· Mucus production. °DIAGNOSIS  °If you have the common symptoms of pneumonia, your health care provider will typically confirm the diagnosis with a chest X-ray. The X-ray will show an abnormality in the lung (pulmonary infiltrate) if you have pneumonia. Other tests of your blood, urine, or sputum may be done to find the specific cause of your pneumonia. Your health care provider may also do tests (blood gases or pulse oximetry) to see how well your lungs are working. °TREATMENT  °Some forms of pneumonia may be spread to other people when you cough or sneeze. You may be asked to wear a mask before and during your exam. Pneumonia that is caused by bacteria is treated with antibiotic medicine. Pneumonia that is caused by the influenza virus may be treated with an antiviral medicine. Most other viral infections must run their course. These infections will not respond to antibiotics.  °HOME CARE INSTRUCTIONS  °· Cough suppressants may be used if you are losing too much rest. However, coughing protects you by clearing your lungs. You should avoid using cough suppressants if you can. °· Your health care provider may have prescribed medicine if he or she thinks your pneumonia   is caused by bacteria or influenza. Finish your medicine even if you start to feel better. °· Your health care provider may also prescribe an expectorant. This loosens the mucus to be coughed up. °· Take medicines only as directed by your health care provider. °· Do not smoke. Smoking is a common cause of bronchitis and can contribute to pneumonia. If you are a smoker and continue to smoke, your cough may last several weeks after your pneumonia has cleared. °· A cold steam vaporizer or humidifier in your room or home may help loosen mucus. °· Coughing is often worse at night. Sleeping in a semi-upright  position in a recliner or using a couple pillows under your head will help with this. °· Get rest as you feel it is needed. Your body will usually let you know when you need to rest. °PREVENTION °A pneumococcal shot (vaccine) is available to prevent a common bacterial cause of pneumonia. This is usually suggested for: °· People over 65 years old. °· Patients on chemotherapy. °· People with chronic lung problems, such as bronchitis or emphysema. °· People with immune system problems. °If you are over 65 or have a high risk condition, you may receive the pneumococcal vaccine if you have not received it before. In some countries, a routine influenza vaccine is also recommended. This vaccine can help prevent some cases of pneumonia. You may be offered the influenza vaccine as part of your care. °If you smoke, it is time to quit. You may receive instructions on how to stop smoking. Your health care provider can provide medicines and counseling to help you quit. °SEEK MEDICAL CARE IF: °You have a fever. °SEEK IMMEDIATE MEDICAL CARE IF:  °· Your illness becomes worse. This is especially true if you are elderly or weakened from any other disease. °· You cannot control your cough with suppressants and are losing sleep. °· You begin coughing up blood. °· You develop pain which is getting worse or is uncontrolled with medicines. °· Any of the symptoms which initially brought you in for treatment are getting worse rather than better. °· You develop shortness of breath or chest pain. °MAKE SURE YOU:  °· Understand these instructions. °· Will watch your condition. °· Will get help right away if you are not doing well or get worse. °Document Released: 02/16/2005 Document Revised: 07/03/2013 Document Reviewed: 05/08/2010 °ExitCare® Patient Information ©2015 [employer]. This information is not intended to replace advice given to you by your health care provider. Make sure you discuss any questions you have with your health care  provider. ° °

## 2013-12-20 NOTE — Progress Notes (Signed)
SATURATION QUALIFICATIONS: (This note is used to comply with regulatory documentation for home oxygen)  Patient Saturations on Room Air at Rest = 87%  Patient Saturations on Room Air while Ambulating = 83%  Patient Saturations on 2 Liters of oxygen while Ambulating = 94%  Please briefly explain why patient needs home oxygen:  Patient needs oxygen in order to [facility] adequate oxygen saturations. Philomena Doheny RN

## 2013-12-20 NOTE — Progress Notes (Signed)
Occupational Therapy Treatment Patient Details Name: [name] MRN: [id number] DOB: 1961-01-10 Today's Date: 12/20/2013    History of present illness 53 yo female admitted with acute resp failure with hypoxia and hypercapnia. Hx of COPD, TIA, CVA, PTSD, bipolar d/o, Sz.    OT comments  Pt doing well. She reports feeling weak from not eating much recently and requests a walker for d/c. She will also benefit from a 3in1 for safety. Practiced toilet transfers and reinforced energy conservation education and safety strategies. Recommend intermittent help at home and pt is aware and working on arranging.     Follow Up Recommendations  Home health OT;Supervision - Intermittent    Equipment Recommendations  3 in 1 bedside comode;Other (comment) (pt requesting rolling walker)    Recommendations for Other Services      Precautions / Restrictions Precautions Precautions: Fall Precaution Comments: watch sats       Mobility Bed Mobility               General bed mobility comments: at EOB  Transfers Overall transfer level: Needs assistance Equipment used: Rolling walker (2 wheeled) Transfers: Sit to/from Stand Sit to Stand: Supervision              Balance                                   ADL                           Toilet Transfer: Min guard;Ambulation;Comfort height toilet;Grab bars (min guard for safety as pt feeling weak on her feet initially. Did better as session progressed. Verbal cues for safe walker use)   Toileting- Clothing Manipulation and Hygiene: Supervision/safety;Sit to/from stand         General ADL Comments: Pt up at EOB when OT arrived brushing her teeth. She was focused on wanting to be able to order breakfast as she states it has been a busy morning and she hasnt eaten yet. Assisted pt with retrieving items to finish grooming and then encouraged pt to order breakfast and she did. Practiced up to bathroom with  walker for toilet transfer. Instructed pt to not pull up on walker for safety as it can flip over but rather push up from her surface. Pt did well with walker and reports it was helpful as she feels weak from not eating much lately. She would like a 3in1 and she does need grab bar to help pull up from comfort height commode. She has a low standard toilet at home and no grab bar or vanity to help stand. Case manager in room during end of sesion and informed of DME needs. Reviewed purse lip breathing techniques and pacing self. Also discussed use of 3in1 as a shower seat. Advised pt to have some assist at d/c especially for showering. Pt stating she will arrange help at d/c.       Vision                     Perception     Praxis      Cognition   Behavior During Therapy: [facility] IncWFL for tasks assessed/performed Overall Cognitive Status: Within Functional Limits for tasks assessed                       Extremity/Trunk Assessment  Exercises     Shoulder Instructions       General Comments      Pertinent Vitals/ Pain       Pain Assessment: No/denies pain  Home Living                                          Prior Functioning/Environment              Frequency Min 2X/week     Progress Toward Goals  OT Goals(current goals can now be found in the care plan section)  Progress towards OT goals: Progressing toward goals     Plan Discharge plan remains appropriate    Co-evaluation                 End of Session Equipment Utilized During Treatment: Oxygen   Activity Tolerance Patient tolerated treatment well   Patient Left in chair;with call bell/phone within reach   Nurse Communication Mobility status        Time: 3662-9476 OT Time Calculation (min): 33 min  Charges: OT General Charges $OT Visit: 1 Procedure OT Treatments $Self Care/Home Management : 8-22 mins $Therapeutic Activity: 8-22 mins  Lennox Laity [phone] 12/20/2013, 11:51 AM

## 2013-12-20 NOTE — Progress Notes (Signed)
Patient given discharge instructions, and verbalized an understanding of all discharge instructions.  Patient agrees with discharge plan, and is being discharged in stable medical condition.  Patient given transportation via wheelchair.  Ismail Graziani RN 

## 2013-12-20 NOTE — Discharge Summary (Signed)
Physician Discharge Summary  [name] WUJ:[id number] DOB: 22-Oct-1960 DOA: 12/13/2013  PCP: Kaleen Mask, MD  Admit date: 12/13/2013 Discharge date: 12/20/2013  Recommendations for Outpatient Follow-up:  1. Check CBC and BMP with PCP. 2. Continue nebulizer treatments and inhalers per c=prior home regimen. 3. Order placed for home oxygen.   Discharge Diagnoses:  Active Problems:   Acute respiratory failure with hypoxia and hypercapnia   Malnutrition of moderate degree    Discharge Condition: stable   Diet recommendation: as tolerated   History of present illness:  53 y.o. year old female with COPD, chronic resp failure-no longer on continuous O2, tobacco abuse, VDRF in January 2015 secondary to metapneumovirus, bipolar depression, hx/o TIA, recently treated with Augmentin for sinusitis, presented with dyspnea, productive cough of yellow sputum, fevers. Upon initial evaluation in ED, found to be in acute hypoxic respiratory failure with oxygen saturation in 80's on RA, requiring BiPAP. Her respiratory status has improved but requires oxygen on discharge.   Assessment/Plan:   Principal Problem:  Acute respiratory failure with hypoxia and hypercapnia  Secondary to acute COPD exacerbation, bronchitis. Feels better this am but still congested.  Continue flutter valve and mucinex BID to help mobilize secretions.  Continue oxygen at home, placed on 3L continuous. Taper prednisone as prescribed, 30 mg followed by 20 mg a day and 10 the following day and then stop. Continue nebulizer treatments per home regimen. Has completed 7 days of doxycycline today so will stop doxycycline today.  Active Problems:  Acute COPD exacerbation / Acute bronchitis   As mentioned above, nebs per home regimen, home oxygen. Steroid taper.  Hypokalemia   Secondary to albuterol affect   Supplemented. Dehydration   Improved with IV fluids.   Tolerating regular diet well  Mild protein  calorie malnutrition   Pre-albumin on lower end of normal. BMI is appropriate, 19. Has relatively good po intake.  DVT Prophylaxis   Stopped heparin and use SCD's (pt wanted to minimize subQ inj)  GI prophylaxis   On protonix   Code Status: Full.  Family Communication: plan of care discussed with the patient  Disposition Plan: home when stable    IV Access:   Peripheral IV Procedures and diagnostic studies:    CXR 12/13/2013 Hyperexpanded lungs without acute cardiopulmonary disease.   Medical Consultants:   None   Other Consultants:   None   Anti-Infectives:   Doxycycline 10/14 --> 12/19/2013   Signed:  Manson Passey, MD  [facility] 12/20/2013, 10:21 AM  Pager #: [phone]   Discharge Exam: Filed Vitals:   12/19/13 2257  BP: 133/76  Pulse: 108  Resp: 18   Filed Vitals:   12/19/13 2057 12/19/13 2257 12/20/13 0841 12/20/13 0854  BP:  133/76    Pulse:  108    Temp:  97.8 F (36.6 C)    TempSrc:  Axillary    Resp:  18    Height:      Weight:      SpO2: 91% 99% 97% 97%    General: Pt is alert, follows commands appropriately, not in acute distress Cardiovascular: Regular rate and rhythm, S1/S2 +, no murmurs Respiratory: no wheezing, congested Abdominal: Soft, non tender, non distended, bowel sounds +, no guarding Extremities: no edema, no cyanosis, pulses palpable bilaterally DP and PT Neuro: Grossly nonfocal  Discharge Instructions  Discharge Instructions   Call MD for:  difficulty breathing, headache or visual disturbances    Complete by:  As directed  Call MD for:  persistant dizziness or light-headedness    Complete by:  As directed      Call MD for:  persistant nausea and vomiting    Complete by:  As directed      Call MD for:  severe uncontrolled pain    Complete by:  As directed      Diet - low sodium heart healthy    Complete by:  As directed      Increase activity slowly    Complete by:  As directed              Medication List         acetaminophen 500 MG tablet  Commonly known as:  TYLENOL  Take 1,000 mg by mouth every 6 (six) hours as needed for moderate pain.     antiseptic oral rinse 0.05 % Liqd solution  Commonly known as:  CPC / CETYLPYRIDINIUM CHLORIDE 0.05%  7 mLs by Mouth Rinse route 2 times daily at 12 noon and 4 pm.     aspirin EC 81 MG tablet  Take 1 tablet (81 mg total) by mouth daily.     chlorhexidine 0.12 % solution  Commonly known as:  PERIDEX  15 mLs by Mouth Rinse route 2 (two) times daily.     cyclobenzaprine 5 MG tablet  Commonly known as:  FLEXERIL  Take 1 tablet (5 mg total) by mouth 3 (three) times daily as needed for muscle spasms.     dextromethorphan-guaiFENesin 30-600 MG per 12 hr tablet  Commonly known as:  MUCINEX DM  Take 1 tablet by mouth 2 (two) times daily as needed for cough.     diazepam 10 MG tablet  Commonly known as:  VALIUM  Take 1 tablet (10 mg total) by mouth every 12 (twelve) hours as needed for anxiety.     dicyclomine 20 MG tablet  Commonly known as:  BENTYL  Take 1 tablet (20 mg total) by mouth 3 (three) times daily before meals.     DSS 100 MG Caps  Take 100 mg by mouth daily as needed for mild constipation.     DULERA 100-5 MCG/ACT Aero  Generic drug:  mometasone-formoterol  inhale 2 puffs by mouth twice a day     fluticasone 50 MCG/ACT nasal spray  Commonly known as:  FLONASE  Place 2 sprays into both nostrils daily as needed for allergies or rhinitis.     hydroxypropyl methylcellulose / hypromellose 2.5 % ophthalmic solution  Commonly known as:  ISOPTO TEARS / GONIOVISC  Place 1 drop into both eyes 3 (three) times daily as needed for dry eyes.     lactose free nutrition Liqd  Take 237 mLs by mouth 2 (two) times daily between meals.     lidocaine 5 % ointment  Commonly known as:  XYLOCAINE  Apply 1 application topically 2 (two) times daily as needed for mild pain.     loratadine 10 MG tablet  Commonly known as:   CLARITIN  Take 10 mg by mouth every evening.     mirtazapine 15 MG tablet  Commonly known as:  REMERON  Take 1 tablet (15 mg total) by mouth at bedtime.     oxyCODONE 15 MG immediate release tablet  Commonly known as:  ROXICODONE  Take 1 tablet (15 mg total) by mouth every 6 (six) hours as needed for pain (back pain).     pantoprazole 40 MG tablet  Commonly known as:  PROTONIX  Take  1 tablet (40 mg total) by mouth daily at 6 (six) AM.     polyethylene glycol packet  Commonly known as:  MIRALAX / GLYCOLAX  Take 17 g by mouth daily as needed (constipation).     predniSONE 10 MG tablet  Commonly known as:  DELTASONE  Take 3 tablets (30 mg total) by mouth daily with breakfast.  Start taking on:  12/21/2013     promethazine 25 MG tablet  Commonly known as:  PHENERGAN  Take 0.5-1 tablets (12.5-25 mg total) by mouth every 6 (six) hours as needed for nausea.     SPIRIVA HANDIHALER 18 MCG inhalation capsule  Generic drug:  tiotropium  inhale contents of 1 capsule by mouth once daily     VENTOLIN HFA 108 (90 BASE) MCG/ACT inhaler  Generic drug:  albuterol  Inhale 2 puffs into the lungs every 6 (six) hours as needed for wheezing or shortness of breath.     albuterol (2.5 MG/3ML) 0.083% nebulizer solution  Commonly known as:  PROVENTIL  Take 3 mLs (2.5 mg total) by nebulization every 6 (six) hours as needed for shortness of breath. For shortness of breath     Vitamin D-3 5000 UNITS Tabs  Take 5,000 Units by mouth daily.     VITAMIN E PO  Take 1 capsule by mouth daily.           Follow-up Information   Follow up with Kaleen Mask, MD In 1 week. (Follow up appt after recent hospitalization)    Specialty:  Family Medicine   Contact information:   [address] [place] [address] [phone]        The results of significant diagnostics from this hospitalization (including imaging, microbiology, ancillary and laboratory) are listed below for reference.     Significant Diagnostic Studies: Dg Chest Port 1 View  12/13/2013   CLINICAL DATA:  COPD, shortness of breath, sinus infection  EXAM: PORTABLE CHEST - 1 VIEW  COMPARISON:  06/21/2013; 03/26/2013; chest CT- 05/24/2013  FINDINGS: Grossly unchanged cardiac silhouette and mediastinal contours the lungs remain hyperexpanded. Unchanged punctate granuloma within the left upper lung. Linear heterogeneous opacities within the right upper lung are unchanged and favored to represent subsegmental atelectasis. No new focal airspace opacities. No pleural effusion or pneumothorax. No evidence of edema. No acute osseus abnormalities.  IMPRESSION: Hyperexpanded lungs without acute cardiopulmonary disease.   Electronically Signed   By: Simonne Come M.D.   On: 12/13/2013 02:54    Microbiology: MRSA PCR SCREENING     Status: None   Collection Time    12/13/13  7:08 AM      Result Value Ref Range Status   MRSA by PCR NEGATIVE  NEGATIVE Final  RESPIRATORY VIRUS PANEL     Status: None   Collection Time    12/13/13  8:14 AM      Result Value Ref Range Status   Source - R[facility]AN NASAL SWAB   Corrected   Comment: CORRECTED ON 10/14 AT 2048: PREVIOUSLY REPORTED AS NASAL SWAB   Respiratory Syncytial Virus A NOT DETECTED   Final   Respiratory Syncytial Virus B NOT DETECTED   Final   Influenza A NOT DETECTED   Final   Influenza B NOT DETECTED   Final   Parainfluenza 1 NOT DETECTED   Final   Parainfluenza 2 NOT DETECTED   Final   Parainfluenza 3 NOT DETECTED   Final   Metapneumovirus NOT DETECTED   Final   Rhinovirus NOT  DETECTED   Final   Adenovirus NOT DETECTED   Final   Influenza A H1 NOT DETECTED   Final   Influenza A H3 NOT DETECTED   Final     Labs: Basic Metabolic Panel:  Recent Labs Lab 12/14/13 0320 12/17/13 0550 12/20/13 0520  NA 134* 142 143  K 4.4 3.5* 3.8  CL 89* 97 96  CO2 35* 38* 34*  GLUCOSE 126* 101* 101*  BUN 10 15 9   CREATININE 0.50 0.56 0.55  CALCIUM 9.4 9.1 10.2   Liver  Function Tests: No results found for this basename: AST, ALT, ALKPHOS, BILITOT, PROT, ALBUMIN,  in the last 168 hours No results found for this basename: LIPASE, AMYLASE,  in the last 168 hours No results found for this basename: AMMONIA,  in the last 168 hours CBC:  Recent Labs Lab 12/14/13 0320 12/17/13 0550 12/20/13 0520  WBC 8.8 9.9 14.5*  HGB 14.4 14.3 15.0  HCT 45.1 45.6 46.8*  MCV 91.9 92.1 90.2  PLT 209 210 237   Cardiac Enzymes: No results found for this basename: CKTOTAL, CKMB, CKMBINDEX, TROPONINI,  in the last 168 hours BNP: BNP (last 3 results)  Recent Labs  03/26/13 0401 03/30/13 0330  PROBNP 1536.0* 136.4*   CBG: No results found for this basename: GLUCAP,  in the last 168 hours  Time coordinating discharge: Over 30 minutes

## 2013-12-28 ENCOUNTER — Other Ambulatory Visit: Payer: Self-pay | Admitting: Pulmonary Disease

## 2014-01-03 ENCOUNTER — Emergency Department (HOSPITAL_COMMUNITY)
Admission: EM | Admit: 2014-01-03 | Discharge: 2014-01-04 | Disposition: A | Discharge status: Home / Self Care | Payer: Medicaid Other | Attending: Emergency Medicine | Admitting: Emergency Medicine

## 2014-01-03 ENCOUNTER — Encounter (HOSPITAL_COMMUNITY): Payer: Self-pay | Admitting: Emergency Medicine

## 2014-01-03 DIAGNOSIS — Z7951 Long term (current) use of inhaled steroids: Secondary | ICD-10-CM | POA: Diagnosis not present

## 2014-01-03 DIAGNOSIS — Z8673 Personal history of transient ischemic attack (TIA), and cerebral infarction without residual deficits: Secondary | ICD-10-CM | POA: Diagnosis not present

## 2014-01-03 DIAGNOSIS — F329 Major depressive disorder, single episode, unspecified: Secondary | ICD-10-CM | POA: Insufficient documentation

## 2014-01-03 DIAGNOSIS — J441 Chronic obstructive pulmonary disease with (acute) exacerbation: Secondary | ICD-10-CM | POA: Insufficient documentation

## 2014-01-03 DIAGNOSIS — M797 Fibromyalgia: Secondary | ICD-10-CM | POA: Diagnosis not present

## 2014-01-03 DIAGNOSIS — Z79899 Other long term (current) drug therapy: Secondary | ICD-10-CM | POA: Insufficient documentation

## 2014-01-03 DIAGNOSIS — Z862 Personal history of diseases of the blood and blood-forming organs and certain disorders involving the immune mechanism: Secondary | ICD-10-CM | POA: Diagnosis not present

## 2014-01-03 DIAGNOSIS — Z7982 Long term (current) use of aspirin: Secondary | ICD-10-CM | POA: Insufficient documentation

## 2014-01-03 DIAGNOSIS — M1990 Unspecified osteoarthritis, unspecified site: Secondary | ICD-10-CM | POA: Insufficient documentation

## 2014-01-03 DIAGNOSIS — G40909 Epilepsy, unspecified, not intractable, without status epilepticus: Secondary | ICD-10-CM | POA: Diagnosis not present

## 2014-01-03 DIAGNOSIS — J0101 Acute recurrent maxillary sinusitis: Secondary | ICD-10-CM | POA: Insufficient documentation

## 2014-01-03 DIAGNOSIS — K219 Gastro-esophageal reflux disease without esophagitis: Secondary | ICD-10-CM | POA: Insufficient documentation

## 2014-01-03 DIAGNOSIS — F4310 Post-traumatic stress disorder, unspecified: Secondary | ICD-10-CM | POA: Insufficient documentation

## 2014-01-03 DIAGNOSIS — R0602 Shortness of breath: Secondary | ICD-10-CM | POA: Diagnosis present

## 2014-01-03 NOTE — ED Notes (Signed)
Pt presents with difficulty breathing onset today, pt speaking in short sentences with pain epigastric area radiating to L back. Pt recently d/c from this facility 10/21, pt is on home o2

## 2014-01-03 NOTE — ED Notes (Signed)
Bed: WTR5 Expected date:  Expected time:  Means of arrival:  Comments: 

## 2014-01-04 ENCOUNTER — Emergency Department (HOSPITAL_COMMUNITY): Payer: Medicaid Other

## 2014-01-04 LAB — CBC
HEMATOCRIT: 44 % (ref 36.0–46.0)
Hemoglobin: 14.6 g/dL (ref 12.0–15.0)
MCH: 29.6 pg (ref 26.0–34.0)
MCHC: 33.2 g/dL (ref 30.0–36.0)
MCV: 89.1 fL (ref 78.0–100.0)
Platelets: 269 10*3/uL (ref 150–400)
RBC: 4.94 MIL/uL (ref 3.87–5.11)
RDW: 14.7 % (ref 11.5–15.5)
WBC: 11.1 10*3/uL — ABNORMAL HIGH (ref 4.0–10.5)

## 2014-01-04 LAB — COMPREHENSIVE METABOLIC PANEL
ALT: 18 U/L (ref 0–35)
ANION GAP: 13 (ref 5–15)
AST: 20 U/L (ref 0–37)
Albumin: 3.7 g/dL (ref 3.5–5.2)
Alkaline Phosphatase: 92 U/L (ref 39–117)
BUN: 13 mg/dL (ref 6–23)
CALCIUM: 9.9 mg/dL (ref 8.4–10.5)
CO2: 29 mEq/L (ref 19–32)
CREATININE: 0.52 mg/dL (ref 0.50–1.10)
Chloride: 94 mEq/L — ABNORMAL LOW (ref 96–112)
GFR calc Af Amer: >90 mL/min (ref >90)
GFR calc non Af Amer: >90 mL/min (ref >90)
Glucose, Bld: 137 mg/dL — ABNORMAL HIGH (ref 70–99)
Potassium: 4.2 mEq/L (ref 3.7–5.3)
Sodium: 136 mEq/L — ABNORMAL LOW (ref 137–147)
TOTAL PROTEIN: 7.8 g/dL (ref 6.0–8.3)
Total Bilirubin: 0.3 mg/dL (ref 0.3–1.2)

## 2014-01-04 LAB — I-STAT TROPONIN, ED: Troponin i, poc: 0 ng/mL (ref 0.00–0.08)

## 2014-01-04 LAB — PRO B NATRIURETIC PEPTIDE: PRO B NATRI PEPTIDE: 46.5 pg/mL (ref 0–125)

## 2014-01-04 MED ORDER — METHYLPREDNISOLONE SODIUM SUCC 125 MG IJ SOLR
125.0000 mg | Freq: Once | INTRAMUSCULAR | Status: AC
  Administered 2014-01-04: 125 mg via INTRAVENOUS
  Filled 2014-01-04: qty 2

## 2014-01-04 MED ORDER — AZITHROMYCIN 250 MG PO TABS: ORAL_TABLET | ORAL | Status: DC

## 2014-01-04 MED ORDER — KETOROLAC TROMETHAMINE 30 MG/ML IJ SOLN
30.0000 mg | Freq: Once | INTRAMUSCULAR | Status: AC
  Administered 2014-01-04: 30 mg via INTRAVENOUS
  Filled 2014-01-04: qty 1

## 2014-01-04 MED ORDER — PREDNISONE 20 MG PO TABS: ORAL_TABLET | ORAL | Status: DC

## 2014-01-04 MED ORDER — ALBUTEROL SULFATE (2.5 MG/3ML) 0.083% IN NEBU
5.0000 mg | INHALATION_SOLUTION | Freq: Once | RESPIRATORY_TRACT | Status: AC
  Administered 2014-01-04: 5 mg via RESPIRATORY_TRACT
  Filled 2014-01-04: qty 6

## 2014-01-04 MED ORDER — MOMETASONE FUROATE 50 MCG/ACT NA SUSP: 2.0000 | Freq: Every day | NASAL | Status: DC

## 2014-01-04 MED ORDER — CEFTRIAXONE SODIUM 1 G IJ SOLR
1.0000 g | Freq: Once | INTRAMUSCULAR | Status: AC
  Administered 2014-01-04: 1 g via INTRAVENOUS
  Filled 2014-01-04: qty 10

## 2014-01-04 MED ORDER — OXYMETAZOLINE HCL 0.05 % NA SOLN: 1.0000 | Freq: Two times a day (BID) | NASAL | Status: DC

## 2014-01-04 NOTE — Progress Notes (Signed)
Pre treatment Peak flow best out of 3.....125. RT will obtain another after treatment is given.

## 2014-01-04 NOTE — Progress Notes (Signed)
Post Peak flow best out of 3....90

## 2014-01-04 NOTE — Discharge Instructions (Signed)
Chronic Obstructive Pulmonary Disease Exacerbation Chronic obstructive pulmonary disease (COPD) is a common lung condition in which airflow from the lungs is limited. COPD is a general term that can be used to describe many different lung problems that limit airflow, including chronic bronchitis and emphysema. COPD exacerbations are episodes when breathing symptoms become much worse and require extra treatment. Without treatment, COPD exacerbations can be life threatening, and frequent COPD exacerbations can cause further damage to your lungs. CAUSES   Respiratory infections.   Exposure to smoke.   Exposure to air pollution, chemical fumes, or dust. Sometimes there is no apparent cause or trigger. RISK FACTORS  Smoking cigarettes.  Older age.  Frequent prior COPD exacerbations. SIGNS AND SYMPTOMS   Increased coughing.   Increased thick spit (sputum) production.   Increased wheezing.   Increased shortness of breath.   Rapid breathing.   Chest tightness. DIAGNOSIS  Your medical history, a physical exam, and tests will help your health care provider make a diagnosis. Tests may include:  A chest X-ray.  Basic lab tests.  Sputum testing.  An arterial blood gas test. TREATMENT  Depending on the severity of your COPD exacerbation, you may need to be admitted to a hospital for treatment. Some of the treatments commonly used to treat COPD exacerbations are:   Antibiotic medicines.   Bronchodilators. These are drugs that expand the air passages. They may be given with an inhaler or nebulizer. Spacer devices may be needed to help improve drug delivery.  Corticosteroid medicines.  Supplemental oxygen therapy.  HOME CARE INSTRUCTIONS   Do not smoke. Quitting smoking is very important to prevent COPD from getting worse and exacerbations from happening as often.  Avoid exposure to all substances that irritate the airway, especially to tobacco smoke.   If you were  prescribed an antibiotic medicine, finish it all even if you start to feel better.  Take all medicines as directed by your health care provider.It is important to use correct technique with inhaled medicines.  Drink enough fluids to keep your urine clear or pale yellow (unless you have a medical condition that requires fluid restriction).  Use a cool mist vaporizer. This makes it easier to clear your chest when you cough.   If you have a home nebulizer and oxygen, continue to use them as directed.   Maintain all necessary vaccinations to prevent infections.   Exercise regularly.   Eat a healthy diet.   Keep all follow-up appointments as directed by your health care provider. SEEK IMMEDIATE MEDICAL CARE IF:  You have worsening shortness of breath.   You have trouble talking.   You have severe chest pain.  You have blood in your sputum.  You have a fever.  You have weakness, vomit repeatedly, or faint.   You feel confused.   You continue to get worse. MAKE SURE YOU:   Understand these instructions.  Will watch your condition.  Will get help right away if you are not doing well or get worse. Document Released: 12/14/2006 Document Revised: 07/03/2013 Document Reviewed: 10/21/2012 [facility] Patient Information 2015 [place], [place]. This information is not intended to replace advice given to you by your health care provider. Make sure you discuss any questions you have with your health care provider.  Sinusitis Sinusitis is redness, soreness, and inflammation of the paranasal sinuses. Paranasal sinuses are air pockets within the bones of your face (beneath the eyes, the middle of the forehead, or above the eyes). In healthy paranasal sinuses, mucus  is able to drain out, and air is able to circulate through them by way of your nose. However, when your paranasal sinuses are inflamed, mucus and air can become trapped. This can allow bacteria and other germs to grow and  cause infection. Sinusitis can develop quickly and last only a short time (acute) or continue over a long period (chronic). Sinusitis that lasts for more than 12 weeks is considered chronic.  CAUSES  Causes of sinusitis include:  Allergies.  Structural abnormalities, such as displacement of the cartilage that separates your nostrils (deviated septum), which can decrease the air flow through your nose and sinuses and affect sinus drainage.  Functional abnormalities, such as when the small hairs (cilia) that line your sinuses and help remove mucus do not work properly or are not present. SIGNS AND SYMPTOMS  Symptoms of acute and chronic sinusitis are the same. The primary symptoms are pain and pressure around the affected sinuses. Other symptoms include:  Upper toothache.  Earache.  Headache.  Bad breath.  Decreased sense of smell and taste.  A cough, which worsens when you are lying flat.  Fatigue.  Fever.  Thick drainage from your nose, which often is green and may contain pus (purulent).  Swelling and warmth over the affected sinuses. DIAGNOSIS  Your health care provider will perform a physical exam. During the exam, your health care provider may:  Look in your nose for signs of abnormal growths in your nostrils (nasal polyps).  Tap over the affected sinus to check for signs of infection.  View the inside of your sinuses (endoscopy) using an imaging device that has a light attached (endoscope). If your health care provider suspects that you have chronic sinusitis, one or more of the following tests may be recommended:  Allergy tests.  Nasal culture. A sample of mucus is taken from your nose, sent to a lab, and screened for bacteria.  Nasal cytology. A sample of mucus is taken from your nose and examined by your health care provider to determine if your sinusitis is related to an allergy. TREATMENT  Most cases of acute sinusitis are related to a viral infection and  will resolve on their own within 10 days. Sometimes medicines are prescribed to help relieve symptoms (pain medicine, decongestants, nasal steroid sprays, or saline sprays).  However, for sinusitis related to a bacterial infection, your health care provider will prescribe antibiotic medicines. These are medicines that will help kill the bacteria causing the infection.  Rarely, sinusitis is caused by a fungal infection. In theses cases, your health care provider will prescribe antifungal medicine. For some cases of chronic sinusitis, surgery is needed. Generally, these are cases in which sinusitis recurs more than 3 times per year, despite other treatments. HOME CARE INSTRUCTIONS   Drink plenty of water. Water helps thin the mucus so your sinuses can drain more easily.  Use a humidifier.  Inhale steam 3 to 4 times a day (for example, sit in the bathroom with the shower running).  Apply a warm, moist washcloth to your face 3 to 4 times a day, or as directed by your health care provider.  Use saline nasal sprays to help moisten and clean your sinuses.  Take medicines only as directed by your health care provider.  If you were prescribed either an antibiotic or antifungal medicine, finish it all even if you start to feel better. SEEK IMMEDIATE MEDICAL CARE IF:  You have increasing pain or severe headaches.  You have nausea, vomiting,  or drowsiness.  You have swelling around your face.  You have vision problems.  You have a stiff neck.  You have difficulty breathing. MAKE SURE YOU:   Understand these instructions.  Will watch your condition.  Will get help right away if you are not doing well or get worse. Document Released: 02/16/2005 Document Revised: 07/03/2013 Document Reviewed: 03/03/2011 [facility] Patient Information 2015 [place], [place]. This information is not intended to replace advice given to you by your health care provider. Make sure you discuss any questions you have  with your health care provider.

## 2014-01-04 NOTE — ED Provider Notes (Signed)
CSN: [id number]     Arrival date & time 01/03/14  2347 History   First MD Initiated Contact with Patient 01/04/14 0021     Chief Complaint  Patient presents with  . Shortness of Breath     (Consider location/radiation/quality/duration/timing/severity/associated sxs/prior Treatment) HPI Patient with a history of COPD presents with increased shortness of breath for the last several days. Recently admitted for COPD exacerbation and discharged home with 2 L of home oxygen. Patient states she has had a productive cough of yellow sputum and a low-grade fever at home. She also complains of facial pain especially on the left with nasal congestion. She has no neck pain or stiffness. Denies nausea vomiting or diarrhea. No chest pain or abdominal pain. Patient states she is using her nebulized treatments at home. Past Medical History  Diagnosis Date  . TIA (transient ischemic attack)   . GERD (gastroesophageal reflux disease)   . Hepatomegaly     hx  . Colonic inertia   . Lung abscess     "calcified over"; bilateral   . COPD (chronic obstructive pulmonary disease)   . Stroke 2009    TIA  . PTSD (post-traumatic stress disorder)   . Bipolar disorder   . Depression   . Chronic headache   . Seizures     in past  . Fibromyalgia   . Iron deficiency anemia   . Arthritis   . Acute ischemic colitis 11/01/2012  . Marijuana abuse 2014    positive screen in hospital  . TIA (transient ischemic attack)    Past Surgical History  Procedure Laterality Date  . 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 Cesarean section      x 2  . Esophagogastroduodenoscopy  12/23/2011    Procedure: ESOPHAGOGASTRODUODENOSCOPY (EGD);  Surgeon: Hart Carwin, MD;  Location: [facility];  Service: Endoscopy;  Laterality: N/A;  . Colonoscopy    . Colonoscopy N/A 11/01/2012    Procedure: COLONOSCOPY;  Surgeon: Iva Boop, MD;  Location: [facility];  Service: Endoscopy;  Laterality:  N/A;   Family History  Problem Relation Age of Onset  . Emphysema Father   . Asthma Son   . Clotting disorder Mother   . Rheum arthritis Sister   . Rheum arthritis Brother   . Ovarian cancer Sister    History  Substance Use Topics  . Smoking status: Current Every Day Smoker -- 1.00 packs/day for 39 years    Types: Cigarettes  . Smokeless tobacco: Never Used     Comment: 3-4 daily11/11/14  . Alcohol Use: No   OB History    No data available     Review of Systems  Constitutional: Positive for fever. Negative for chills.  HENT: Positive for congestion and sinus pressure.   Respiratory: Positive for cough, shortness of breath and wheezing. Negative for chest tightness.   Cardiovascular: Negative for chest pain.  Gastrointestinal: Negative for vomiting, abdominal pain, diarrhea and constipation.  Musculoskeletal: Negative for back pain, neck pain and neck stiffness.  Skin: Negative for rash and wound.  Neurological: Positive for headaches. Negative for dizziness, weakness, light-headedness and numbness.  All other systems reviewed and are negative.     Allergies  Oxycodone-acetaminophen; Codeine; Hydrocodone; and Quinolones  Home Medications   Prior to Admission medications   Medication Sig Start Date End Date Taking? Authorizing Provider  acetaminophen (TYLENOL) 500 MG tablet Take 1,000 mg by mouth every  6 (six) hours as needed for moderate pain.   Yes Historical Provider, MD  albuterol (PROVENTIL) (2.5 MG/3ML) 0.083% nebulizer solution Take 3 mLs (2.5 mg total) by nebulization every 6 (six) hours as needed for shortness of breath. For shortness of breath 12/20/13  Yes Alison MurrayAlma M Devine, MD  albuterol (VENTOLIN HFA) 108 (90 BASE) MCG/ACT inhaler Inhale 2 puffs into the lungs every 6 (six) hours as needed for wheezing or shortness of breath.    Yes Historical Provider, MD  antiseptic oral rinse (CPC / CETYLPYRIDINIUM CHLORIDE 0.05%) 0.05 % LIQD solution 7 mLs by Mouth Rinse  route 2 times daily at 12 noon and 4 pm. 12/20/13  Yes Alison MurrayAlma M Devine, MD  aspirin EC 81 MG tablet Take 1 tablet (81 mg total) by mouth daily. 10/14/12  Yes Costin Otelia SergeantM Gherghe, MD  chlorhexidine (PERIDEX) 0.12 % solution 15 mLs by Mouth Rinse route 2 (two) times daily. 12/20/13  Yes Alison MurrayAlma M Devine, MD  Cholecalciferol (VITAMIN D-3) 5000 UNITS TABS Take 5,000 Units by mouth daily.   Yes Historical Provider, MD  cyclobenzaprine (FLEXERIL) 5 MG tablet Take 1 tablet (5 mg total) by mouth 3 (three) times daily as needed for muscle spasms. 12/20/13  Yes Alison MurrayAlma M Devine, MD  dextromethorphan-guaiFENesin [facility](MUCINEX DM) 30-600 MG per 12 hr tablet Take 1 tablet by mouth 2 (two) times daily as needed for cough. 12/20/13  Yes Alison MurrayAlma M Devine, MD  diazepam (VALIUM) 10 MG tablet Take 1 tablet (10 mg total) by mouth every 12 (twelve) hours as needed for anxiety. 12/20/13  Yes Alison MurrayAlma M Devine, MD  dicyclomine (BENTYL) 20 MG tablet Take 1 tablet (20 mg total) by mouth 3 (three) times daily before meals. 11/02/12  Yes Dorothea OgleIskra M Myers, MD  docusate sodium 100 MG CAPS Take 100 mg by mouth daily as needed for mild constipation. 12/20/13  Yes Alison MurrayAlma M Devine, MD  DULERA 100-5 MCG/ACT AERO inhale 2 puffs by mouth twice a day   Yes Barbaraann ShareKeith M Clance, MD  fluticasone [facility](FLONASE) 50 MCG/ACT nasal spray Place 2 sprays into both nostrils daily as needed for allergies or rhinitis.   Yes Historical Provider, MD  lactose free nutrition (BOOST PLUS) LIQD Take 237 mLs by mouth 2 (two) times daily between meals. 04/03/13  Yes Nishant Dhungel, MD  lidocaine (XYLOCAINE) 5 % ointment Apply 1 application topically 2 (two) times daily as needed for mild pain.   Yes Historical Provider, MD  loratadine (CLARITIN) 10 MG tablet Take 10 mg by mouth every evening.    Yes Historical Provider, MD  mirtazapine (REMERON) 15 MG tablet Take 1 tablet (15 mg total) by mouth at bedtime. 12/20/13  Yes Alison MurrayAlma M Devine, MD  oxyCODONE (ROXICODONE) 15 MG immediate release tablet Take 1  tablet (15 mg total) by mouth every 6 (six) hours as needed for pain (back pain). 12/20/13  Yes Alison MurrayAlma M Devine, MD  pantoprazole (PROTONIX) 40 MG tablet Take 1 tablet (40 mg total) by mouth daily at 6 (six) AM. 11/02/12  Yes Dorothea OgleIskra M Myers, MD  promethazine (PHENERGAN) 25 MG tablet Take 0.5-1 tablets (12.5-25 mg total) by mouth every 6 (six) hours as needed for nausea. 12/20/13  Yes Alison MurrayAlma M Devine, MD  SPIRIVA HANDIHALER 18 MCG inhalation capsule inhale contents of 1 capsule by mouth once daily 12/01/12  Yes Barbaraann ShareKeith M Clance, MD  VITAMIN E PO Take 1 capsule by mouth daily.   Yes Historical Provider, MD  hydroxypropyl methylcellulose (ISOPTO TEARS) 2.5 % ophthalmic solution  Place 1 drop into both eyes 3 (three) times daily as needed for dry eyes.    Historical Provider, MD  polyethylene glycol (MIRALAX / GLYCOLAX) packet Take 17 g by mouth daily as needed (constipation).    Historical Provider, MD  predniSONE (DELTASONE) 10 MG tablet Take 3 tablets (30 mg total) by mouth daily with breakfast. 12/21/13   Alison Murray, MD   BP 123/72 mmHg  Pulse 107  Temp(Src) 98.8 F (37.1 C) (Temporal)  Resp 17  Ht 5\' 7"  (1.702 m)  Wt 124 lb (56.246 kg)  BMI 19.42 kg/m2  SpO2 94%  LMP 05/29/2010 Physical Exam  Constitutional: She is oriented to person, place, and time. She appears well-developed and well-nourished. No distress.  HENT:  Head: Normocephalic and atraumatic.  Mouth/Throat: Oropharynx is clear and moist. No oropharyngeal exudate.  Bilateral nasal mucosal edema with left greater than right maxillary sinus tenderness with percussion.  Eyes: EOM are normal. Pupils are equal, round, and reactive to light.  Neck: Normal range of motion. Neck supple.  No meningismus  Cardiovascular: Normal rate and regular rhythm.   Pulmonary/Chest: Effort normal. No respiratory distress. She has wheezes. She has no rales.  Patient is speaking and extended dyspnea whatsoever. She has mild scattered end expiratory wheezing  throughout.   Abdominal: Soft. Bowel sounds are normal. She exhibits no distension and no mass. There is no tenderness. There is no rebound and no guarding.  Musculoskeletal: Normal range of motion. She exhibits no edema or tenderness.  No calf swelling or tenderness.  Neurological: She is alert and oriented to person, place, and time.  5/5 motor in all extremities. Sensation is grossly intact.  Skin: Skin is warm and dry. No rash noted. No erythema.  Psychiatric: She has a normal mood and affect. Her behavior is normal.  Nursing note and vitals reviewed.   ED Course  Procedures (including critical care time) Labs Review Labs Reviewed  CBC - Abnormal; Notable for the following:    WBC 11.1 (*)    All other components within normal limits  COMPREHENSIVE METABOLIC PANEL - Abnormal; Notable for the following:    Sodium 136 (*)    Chloride 94 (*)    Glucose, Bld 137 (*)    All other components within normal limits  PRO B NATRIURETIC PEPTIDE  I-STAT TROPOININ, ED    Imaging Review Dg Chest 2 View  01/04/2014   CLINICAL DATA:  Shortness of breath for 24 hr.  Weakness.  EXAM: CHEST  2 VIEW  COMPARISON:  12/13/2013  FINDINGS: Two views of the chest demonstrate hyperinflation and emphysematous changes. There are enlarged and increased linear parenchymal densities at the right lung apex. Nodular density in the left mid chest probably represents a nipple shadow. Heart size is normal. No evidence for pleural effusions. No acute bone abnormality. Stable calcification in the left upper lung.  IMPRESSION: New parenchymal densities at the right lung apex. Findings suggest acute infection or inflammation.  Hyperinflation and emphysematous disease.   Electronically Signed   By: Richarda Overlie M.D.   On: 01/04/2014 01:18     EKG Interpretation None      MDM   Final diagnoses:  None    Tachycardia is resolved. Patient is breathing comfortably. Mildly elevated white blood cell count. Questionable  infiltrate on chest x-ray. Patient also appears to have maxillary sinusitis. Given IV Rocephin and will be discharged home with Z-Pak. She been advised to follow-up with her primary doctor. Return precautions given.  Loren Racer, MD 01/04/14 [phone]

## 2014-01-09 ENCOUNTER — Other Ambulatory Visit: Payer: Self-pay | Admitting: Pulmonary Disease

## 2014-01-16 ENCOUNTER — Other Ambulatory Visit: Payer: Self-pay | Admitting: Pulmonary Disease

## 2014-03-06 ENCOUNTER — Encounter (HOSPITAL_COMMUNITY): Payer: Self-pay | Admitting: Emergency Medicine

## 2014-03-06 ENCOUNTER — Inpatient Hospital Stay (HOSPITAL_COMMUNITY)
Admission: EM | Admit: 2014-03-06 | Discharge: 2014-03-08 | DRG: 872 | Discharge status: Against Medical Advice | Payer: Medicaid Other | Attending: Internal Medicine | Admitting: Internal Medicine

## 2014-03-06 ENCOUNTER — Emergency Department (HOSPITAL_COMMUNITY): Payer: Medicaid Other

## 2014-03-06 DIAGNOSIS — Z7982 Long term (current) use of aspirin: Secondary | ICD-10-CM

## 2014-03-06 DIAGNOSIS — Z8041 Family history of malignant neoplasm of ovary: Secondary | ICD-10-CM

## 2014-03-06 DIAGNOSIS — M797 Fibromyalgia: Secondary | ICD-10-CM | POA: Diagnosis present

## 2014-03-06 DIAGNOSIS — D509 Iron deficiency anemia, unspecified: Secondary | ICD-10-CM | POA: Diagnosis present

## 2014-03-06 DIAGNOSIS — A419 Sepsis, unspecified organism: Principal | ICD-10-CM | POA: Diagnosis present

## 2014-03-06 DIAGNOSIS — R600 Localized edema: Secondary | ICD-10-CM | POA: Diagnosis present

## 2014-03-06 DIAGNOSIS — K5900 Constipation, unspecified: Secondary | ICD-10-CM | POA: Diagnosis present

## 2014-03-06 DIAGNOSIS — G43909 Migraine, unspecified, not intractable, without status migrainosus: Secondary | ICD-10-CM | POA: Diagnosis present

## 2014-03-06 DIAGNOSIS — K760 Fatty (change of) liver, not elsewhere classified: Secondary | ICD-10-CM | POA: Diagnosis present

## 2014-03-06 DIAGNOSIS — R109 Unspecified abdominal pain: Secondary | ICD-10-CM | POA: Diagnosis present

## 2014-03-06 DIAGNOSIS — R933 Abnormal findings on diagnostic imaging of other parts of digestive tract: Secondary | ICD-10-CM | POA: Insufficient documentation

## 2014-03-06 DIAGNOSIS — F4310 Post-traumatic stress disorder, unspecified: Secondary | ICD-10-CM | POA: Diagnosis present

## 2014-03-06 DIAGNOSIS — Z720 Tobacco use: Secondary | ICD-10-CM | POA: Diagnosis present

## 2014-03-06 DIAGNOSIS — Z8673 Personal history of transient ischemic attack (TIA), and cerebral infarction without residual deficits: Secondary | ICD-10-CM

## 2014-03-06 DIAGNOSIS — K219 Gastro-esophageal reflux disease without esophagitis: Secondary | ICD-10-CM | POA: Diagnosis present

## 2014-03-06 DIAGNOSIS — F17210 Nicotine dependence, cigarettes, uncomplicated: Secondary | ICD-10-CM | POA: Diagnosis present

## 2014-03-06 DIAGNOSIS — J9611 Chronic respiratory failure with hypoxia: Secondary | ICD-10-CM | POA: Diagnosis present

## 2014-03-06 DIAGNOSIS — K922 Gastrointestinal hemorrhage, unspecified: Secondary | ICD-10-CM | POA: Diagnosis present

## 2014-03-06 DIAGNOSIS — Z79891 Long term (current) use of opiate analgesic: Secondary | ICD-10-CM

## 2014-03-06 DIAGNOSIS — R1084 Generalized abdominal pain: Secondary | ICD-10-CM | POA: Insufficient documentation

## 2014-03-06 DIAGNOSIS — Z825 Family history of asthma and other chronic lower respiratory diseases: Secondary | ICD-10-CM

## 2014-03-06 DIAGNOSIS — K529 Noninfective gastroenteritis and colitis, unspecified: Secondary | ICD-10-CM | POA: Diagnosis present

## 2014-03-06 DIAGNOSIS — Z79899 Other long term (current) drug therapy: Secondary | ICD-10-CM

## 2014-03-06 DIAGNOSIS — K921 Melena: Secondary | ICD-10-CM | POA: Diagnosis present

## 2014-03-06 DIAGNOSIS — R509 Fever, unspecified: Secondary | ICD-10-CM | POA: Diagnosis present

## 2014-03-06 DIAGNOSIS — Z8719 Personal history of other diseases of the digestive system: Secondary | ICD-10-CM

## 2014-03-06 DIAGNOSIS — M1990 Unspecified osteoarthritis, unspecified site: Secondary | ICD-10-CM | POA: Diagnosis present

## 2014-03-06 DIAGNOSIS — E860 Dehydration: Secondary | ICD-10-CM | POA: Diagnosis present

## 2014-03-06 DIAGNOSIS — J449 Chronic obstructive pulmonary disease, unspecified: Secondary | ICD-10-CM | POA: Diagnosis present

## 2014-03-06 DIAGNOSIS — Z7952 Long term (current) use of systemic steroids: Secondary | ICD-10-CM

## 2014-03-06 DIAGNOSIS — Z7951 Long term (current) use of inhaled steroids: Secondary | ICD-10-CM

## 2014-03-06 LAB — COMPREHENSIVE METABOLIC PANEL
ALT: 13 U/L (ref 0–35)
ANION GAP: 7 (ref 5–15)
AST: 25 U/L (ref 0–37)
Albumin: 4.4 g/dL (ref 3.5–5.2)
Alkaline Phosphatase: 94 U/L (ref 39–117)
BUN: 8 mg/dL (ref 6–23)
CALCIUM: 9.3 mg/dL (ref 8.4–10.5)
CO2: 29 mmol/L (ref 19–32)
Chloride: 97 mEq/L (ref 96–112)
Creatinine, Ser: 0.68 mg/dL (ref 0.50–1.10)
GFR calc non Af Amer: >90 mL/min (ref >90)
Glucose, Bld: 138 mg/dL — ABNORMAL HIGH (ref 70–99)
POTASSIUM: 3.9 mmol/L (ref 3.5–5.1)
Sodium: 133 mmol/L — ABNORMAL LOW (ref 135–145)
Total Bilirubin: 0.8 mg/dL (ref 0.3–1.2)
Total Protein: 7.7 g/dL (ref 6.0–8.3)

## 2014-03-06 LAB — CBC WITH DIFFERENTIAL/PLATELET
BASOS PCT: 0 % (ref 0–1)
Basophils Absolute: 0 10*3/uL (ref 0.0–0.1)
Eosinophils Absolute: 0 10*3/uL (ref 0.0–0.7)
Eosinophils Relative: 0 % (ref 0–5)
HCT: 48.5 % — ABNORMAL HIGH (ref 36.0–46.0)
HEMOGLOBIN: 16.4 g/dL — AB (ref 12.0–15.0)
LYMPHS PCT: 13 % (ref 12–46)
Lymphs Abs: 1.9 10*3/uL (ref 0.7–4.0)
MCH: 30.6 pg (ref 26.0–34.0)
MCHC: 33.8 g/dL (ref 30.0–36.0)
MCV: 90.5 fL (ref 78.0–100.0)
MONOS PCT: 7 % (ref 3–12)
Monocytes Absolute: 1.1 10*3/uL — ABNORMAL HIGH (ref 0.1–1.0)
NEUTROS PCT: 80 % — AB (ref 43–77)
Neutro Abs: 12.2 10*3/uL — ABNORMAL HIGH (ref 1.7–7.7)
Platelets: 226 10*3/uL (ref 150–400)
RBC: 5.36 MIL/uL — AB (ref 3.87–5.11)
RDW: 14.5 % (ref 11.5–15.5)
WBC: 15.3 10*3/uL — AB (ref 4.0–10.5)

## 2014-03-06 LAB — I-STAT TROPONIN, ED: Troponin i, poc: 0 ng/mL (ref 0.00–0.08)

## 2014-03-06 LAB — I-STAT CG4 LACTIC ACID, ED: Lactic Acid, Venous: 2.12 mmol/L (ref 0.5–2.2)

## 2014-03-06 LAB — LIPASE, BLOOD: LIPASE: 19 U/L (ref 11–59)

## 2014-03-06 LAB — POC OCCULT BLOOD, ED: FECAL OCCULT BLD: POSITIVE — AB

## 2014-03-06 MED ORDER — ONDANSETRON HCL 4 MG/2ML IJ SOLN
4.0000 mg | Freq: Once | INTRAMUSCULAR | Status: AC
Start: 2014-03-06 — End: 2014-03-07
  Administered 2014-03-07: 4 mg via INTRAVENOUS
  Filled 2014-03-06: qty 2

## 2014-03-06 MED ORDER — SODIUM CHLORIDE 0.9 % IV BOLUS (SEPSIS)
1000.0000 mL | Freq: Once | INTRAVENOUS | Status: AC
  Administered 2014-03-06: 1000 mL via INTRAVENOUS

## 2014-03-06 MED ORDER — FAMOTIDINE IN NACL 20-0.9 MG/50ML-% IV SOLN
20.0000 mg | INTRAVENOUS | Status: AC
  Administered 2014-03-07: 20 mg via INTRAVENOUS
  Filled 2014-03-06: qty 50

## 2014-03-06 MED ORDER — HYDROMORPHONE HCL 1 MG/ML IJ SOLN
1.0000 mg | Freq: Once | INTRAMUSCULAR | Status: AC
  Administered 2014-03-07: 1 mg via INTRAVENOUS
  Filled 2014-03-06: qty 1

## 2014-03-06 MED ORDER — ONDANSETRON HCL 4 MG/2ML IJ SOLN
4.0000 mg | Freq: Once | INTRAMUSCULAR | Status: AC
  Administered 2014-03-06: 4 mg via INTRAVENOUS
  Filled 2014-03-06: qty 2

## 2014-03-06 MED ORDER — PANTOPRAZOLE SODIUM 40 MG IV SOLR
40.0000 mg | Freq: Once | INTRAVENOUS | Status: AC
  Administered 2014-03-07: 40 mg via INTRAVENOUS
  Filled 2014-03-06: qty 40

## 2014-03-06 MED ORDER — SODIUM CHLORIDE 0.9 % IV BOLUS (SEPSIS)
1000.0000 mL | Freq: Once | INTRAVENOUS | Status: AC
  Administered 2014-03-07: 1000 mL via INTRAVENOUS

## 2014-03-06 MED ORDER — HYDROMORPHONE HCL 1 MG/ML IJ SOLN
0.5000 mg | Freq: Once | INTRAMUSCULAR | Status: AC
  Administered 2014-03-06: 0.5 mg via INTRAVENOUS
  Filled 2014-03-06: qty 1

## 2014-03-06 MED ORDER — PIPERACILLIN-TAZOBACTAM 3.375 G IVPB 30 MIN
3.3750 g | Freq: Once | INTRAVENOUS | Status: AC
  Administered 2014-03-07: 3.375 g via INTRAVENOUS
  Filled 2014-03-06: qty 50

## 2014-03-06 NOTE — ED Notes (Signed)
Patient was notified of urine sample. Will let fluids finish

## 2014-03-06 NOTE — ED Notes (Signed)
Pt states that she began having bilateral diffuse ABD pain last night accompanied with HA, fever, n/v, and bloody stools that looked like "coffee grounds." Pt denies any painful urination or unusual vaginal discharge.

## 2014-03-06 NOTE — ED Provider Notes (Signed)
The patient is a 54 year old female who has a history of ischemic colitis with rectal bleeding in the past who presents with abdominal discomfort which started yesterday, proceeded today, and was associated with multiple bowels with what appeared to be coffee-ground emesis and significant lower abdominal cramping including the right lower quadrant. On exam the patient is tachycardic with a pulse of 115, tenderness mostly in the right lower but also in the mid lower abdomen. Epigastric discomfort as well.  she has no pulmonary symptoms, no peripheral edema and on a rectal exam chaperoned by the nurse she had melena. this was heme positive, labs pending,  anticipate admission. We'll also give Pepcid, Protonix.  Medical screening examination/treatment/procedure(s) were conducted as a shared visit with non-physician practitioner(s) and myself.  I personally evaluated the patient during the encounter.  Clinical Impression:   Final diagnoses:  Fever  Abdominal pain, generalized  Gastrointestinal hemorrhage with melena         Vida Roller, MD 03/09/14 [phone]

## 2014-03-06 NOTE — ED Provider Notes (Signed)
CSN: [id number]     Arrival date & time 03/06/14  2116 History   First MD Initiated Contact with Patient 03/06/14 2139     Chief Complaint  Patient presents with  . Abdominal Pain  . Rectal Bleeding     (Consider location/radiation/quality/duration/timing/severity/associated sxs/prior Treatment) HPI   [name] is a 54 y.o. female possible history significant for COPD, PTSD, bipolar, fibromyalgia complaining of worsening abdominal pain initially on the right mid lateral side radiating to the left side associated with fever Tmax 101.2 @ 5 PM today and 2 episodes of nonbloody, nonbilious, coffee-ground emesis. Patient reports that she is having rectal bleeding and passing what she believes is not stool but bright red blood and looks like coffee grounds. States that this feels like prior episode of ischemic colitis (10/2012 questionable etiology). She denies dysuria, hematuria, abnormal vaginal discharge, cough, shortness of breath, chest pain. She states that the abdominal pain puts pressure on her chest. Feels her mouth is dry. Denies EtOH, NSAIDS, chronic steroid use.    Past Medical History  Diagnosis Date  . TIA (transient ischemic attack)   . GERD (gastroesophageal reflux disease)   . Hepatomegaly     hx  . Colonic inertia   . Lung abscess     "calcified over"; bilateral   . COPD (chronic obstructive pulmonary disease)   . Stroke 2009    TIA  . PTSD (post-traumatic stress disorder)   . Bipolar disorder   . Depression   . Chronic headache   . Seizures     in past  . Fibromyalgia   . Iron deficiency anemia   . Arthritis   . Acute ischemic colitis 11/01/2012  . Marijuana abuse 2014    positive screen in hospital  . TIA (transient ischemic attack)    Past Surgical History  Procedure Laterality Date  . 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 Cesarean section      x 2  . Esophagogastroduodenoscopy  12/23/2011    Procedure:  ESOPHAGOGASTRODUODENOSCOPY (EGD);  Surgeon: Hart Carwin, MD;  Location: [facility];  Service: Endoscopy;  Laterality: N/A;  . Colonoscopy    . Colonoscopy N/A 11/01/2012    Procedure: COLONOSCOPY;  Surgeon: Iva Boop, MD;  Location: [facility];  Service: Endoscopy;  Laterality: N/A;   Family History  Problem Relation Age of Onset  . Emphysema Father   . Asthma Son   . Clotting disorder Mother   . Rheum arthritis Sister   . Rheum arthritis Brother   . Ovarian cancer Sister    History  Substance Use Topics  . Smoking status: Current Every Day Smoker -- 1.00 packs/day for 39 years    Types: Cigarettes  . Smokeless tobacco: Never Used     Comment: 3-4 daily11/11/14  . Alcohol Use: No   OB History    No data available     Review of Systems  10 systems reviewed and found to be negative, except as noted in the HPI.   Allergies  Oxycodone-acetaminophen; Codeine; Hydrocodone; and Quinolones  Home Medications   Prior to Admission medications   Medication Sig Start Date End Date Taking? Authorizing Provider  acetaminophen (TYLENOL) 500 MG tablet Take 1,000 mg by mouth every 6 (six) hours as needed for moderate pain.    Historical Provider, MD  albuterol (PROVENTIL) (2.5 MG/3ML) 0.083% nebulizer solution Take 3 mLs (2.5 mg total) by nebulization every  6 (six) hours as needed for shortness of breath. For shortness of breath 12/20/13   Alison Murray, MD  albuterol (VENTOLIN HFA) 108 (90 BASE) MCG/ACT inhaler Inhale 2 puffs into the lungs every 6 (six) hours as needed for wheezing or shortness of breath.     Historical Provider, MD  antiseptic oral rinse (CPC / CETYLPYRIDINIUM CHLORIDE 0.05%) 0.05 % LIQD solution 7 mLs by Mouth Rinse route 2 times daily at 12 noon and 4 pm. 12/20/13   Alison Murray, MD  aspirin EC 81 MG tablet Take 1 tablet (81 mg total) by mouth daily. 10/14/12   Costin Otelia Sergeant, MD  azithromycin (ZITHROMAX Z-PAK) 250 MG tablet 2 po day one, then 1 daily x 4 days  01/04/14   Loren Racer, MD  chlorhexidine (PERIDEX) 0.12 % solution 15 mLs by Mouth Rinse route 2 (two) times daily. 12/20/13   Alison Murray, MD  Cholecalciferol (VITAMIN D-3) 5000 UNITS TABS Take 5,000 Units by mouth daily.    Historical Provider, MD  cyclobenzaprine (FLEXERIL) 5 MG tablet Take 1 tablet (5 mg total) by mouth 3 (three) times daily as needed for muscle spasms. 12/20/13   Alison Murray, MD  dextromethorphan-guaiFENesin [facility] DM) 30-600 MG per 12 hr tablet Take 1 tablet by mouth 2 (two) times daily as needed for cough. 12/20/13   Alison Murray, MD  diazepam (VALIUM) 10 MG tablet Take 1 tablet (10 mg total) by mouth every 12 (twelve) hours as needed for anxiety. 12/20/13   Alison Murray, MD  dicyclomine (BENTYL) 20 MG tablet Take 1 tablet (20 mg total) by mouth 3 (three) times daily before meals. 11/02/12   Dorothea Ogle, MD  docusate sodium 100 MG CAPS Take 100 mg by mouth daily as needed for mild constipation. 12/20/13   Alison Murray, MD  DULERA 100-5 MCG/ACT AERO inhale 2 puffs by mouth twice a day    Barbaraann Share, MD  fluticasone [facility]) 50 MCG/ACT nasal spray Place 2 sprays into both nostrils daily as needed for allergies or rhinitis.    Historical Provider, MD  hydroxypropyl methylcellulose (ISOPTO TEARS) 2.5 % ophthalmic solution Place 1 drop into both eyes 3 (three) times daily as needed for dry eyes.    Historical Provider, MD  lactose free nutrition (BOOST PLUS) LIQD Take 237 mLs by mouth 2 (two) times daily between meals. 04/03/13   Nishant Dhungel, MD  lidocaine (XYLOCAINE) 5 % ointment Apply 1 application topically 2 (two) times daily as needed for mild pain.    Historical Provider, MD  loratadine (CLARITIN) 10 MG tablet Take 10 mg by mouth every evening.     Historical Provider, MD  mirtazapine (REMERON) 15 MG tablet Take 1 tablet (15 mg total) by mouth at bedtime. 12/20/13   Alison Murray, MD  mometasone (NASONEX) 50 MCG/ACT nasal spray Place 2 sprays into the nose  daily. 01/04/14   Loren Racer, MD  oxyCODONE (ROXICODONE) 15 MG immediate release tablet Take 1 tablet (15 mg total) by mouth every 6 (six) hours as needed for pain (back pain). 12/20/13   Alison Murray, MD  oxymetazoline (AFRIN NASAL SPRAY) 0.05 % nasal spray Place 1 spray into both nostrils 2 (two) times daily. 01/04/14   Loren Racer, MD  pantoprazole (PROTONIX) 40 MG tablet Take 1 tablet (40 mg total) by mouth daily at 6 (six) AM. 11/02/12   Dorothea Ogle, MD  polyethylene glycol (MIRALAX / Ethelene Hal) packet Take 17 g  by mouth daily as needed (constipation).    Historical Provider, MD  predniSONE (DELTASONE) 10 MG tablet Take 3 tablets (30 mg total) by mouth daily with breakfast. 12/21/13   Alison Murray, MD  predniSONE (DELTASONE) 20 MG tablet 3 tabs po day one, then 2 po daily x 4 days 01/04/14   Loren Racer, MD  promethazine (PHENERGAN) 25 MG tablet Take 0.5-1 tablets (12.5-25 mg total) by mouth every 6 (six) hours as needed for nausea. 12/20/13   Alison Murray, MD  SPIRIVA HANDIHALER 18 MCG inhalation capsule inhale contents of 1 capsule by mouth once daily 12/01/12   Barbaraann Share, MD  VITAMIN E PO Take 1 capsule by mouth daily.    Historical Provider, MD   BP 115/79 mmHg  Temp(Src) 98.3 F (36.8 C) (Oral)  Resp 20  Ht 5\' 7"  (1.702 m)  Wt 125 lb (56.7 kg)  BMI 19.57 kg/m2  SpO2 94%  LMP 05/29/2010 Physical Exam  Constitutional: She is oriented to person, place, and time. She appears well-developed and well-nourished. No distress.  HENT:  Head: Normocephalic and atraumatic.  Mouth/Throat: Oropharynx is clear and moist.  Eyes: Conjunctivae and EOM are normal. Pupils are equal, round, and reactive to light.  Neck: Normal range of motion.  Cardiovascular: Normal rate, regular rhythm and intact distal pulses.   Pulmonary/Chest: Effort normal and breath sounds normal. No stridor. No respiratory distress. She has no wheezes. She has no rales. She exhibits no tenderness.   Abdominal: Soft. She exhibits no distension and no mass. There is no tenderness. There is no rebound and no guarding.  Hypoactive bowel sounds  Diffusely TTP especially on the right side.   Musculoskeletal: Normal range of motion. She exhibits no edema or tenderness.  Neurological: She is alert and oriented to person, place, and time.  Psychiatric: She has a normal mood and affect.  Nursing note and vitals reviewed.   ED Course  Procedures (including critical care time) Labs Review Labs Reviewed  CBC WITH DIFFERENTIAL - Abnormal; Notable for the following:    WBC 15.3 (*)    RBC 5.36 (*)    Hemoglobin 16.4 (*)    HCT 48.5 (*)    Neutrophils Relative % 80 (*)    Neutro Abs 12.2 (*)    Monocytes Absolute 1.1 (*)    All other components within normal limits  COMPREHENSIVE METABOLIC PANEL - Abnormal; Notable for the following:    Sodium 133 (*)    Glucose, Bld 138 (*)    All other components within normal limits  POC OCCULT BLOOD, ED - Abnormal; Notable for the following:    Fecal Occult Bld POSITIVE (*)    All other components within normal limits  CULTURE, BLOOD (ROUTINE X 2)  CULTURE, BLOOD (ROUTINE X 2)  URINE CULTURE  LIPASE, BLOOD  URINALYSIS, ROUTINE W REFLEX MICROSCOPIC  PROTIME-INR  APTT  I-STAT TROPOININ, ED  I-STAT CG4 LACTIC ACID, ED    Imaging Review Dg Chest Port 1 View  03/06/2014   CLINICAL DATA:  Fever, shortness of breath and left-sided chest pain.  EXAM: PORTABLE CHEST - 1 VIEW  COMPARISON:  01/04/2014  FINDINGS: The cardiac silhouette, mediastinal and hilar contours are within normal limits and stable. There are severe chronic lung changes with emphysema and pulmonary scarring. Stable calcified granuloma in the left upper lobe. No definite acute pulmonary findings. No pleural effusion. The bony thorax is intact.  IMPRESSION: Chronic emphysematous changes and pulmonary scarring but no definite  acute overlying pulmonary process.   Electronically Signed   By:  Loralie Champagne M.D.   On: 03/06/2014 22:16     EKG Interpretation   Date/Time:  Tuesday March 06 2014 21:24:14 EST Ventricular Rate:  121 PR Interval:  157 QRS Duration: 76 QT Interval:  303 QTC Calculation: 430 R Axis:   78 Text Interpretation:  Sinus tachycardia Consider right atrial enlargement  Nonspecific T abnormalities, lateral leads since last tracing no  significant change Confirmed by Hyacinth Meeker  MD, BRIAN ([id number]) on 03/06/2014  10:01:33 PM      MDM   Final diagnoses:  Fever  Abdominal pain, generalized  Gastrointestinal hemorrhage with melena    Filed Vitals:   03/06/14 2230 03/06/14 2251 03/06/14 2300 03/06/14 2330  BP: 94/65 112/64 118/68 104/60  Pulse: 108 100 98   Temp:      TempSrc:      Resp: Height:      Weight:      SpO2: 97% 100% 100%     Medications  ondansetron (ZOFRAN) injection 4 mg (not administered)  famotidine (PEPCID) IVPB 20 mg (not administered)  pantoprazole (PROTONIX) injection 40 mg (not administered)  sodium chloride 0.9 % bolus 1,000 mL (not administered)  piperacillin-tazobactam (ZOSYN) IVPB 3.375 g (not administered)  HYDROmorphone (DILAUDID) injection 1 mg (not administered)  iohexol (OMNIPAQUE) 300 MG/ML solution 50 mL (not administered)  ondansetron (ZOFRAN) injection 4 mg (4 mg Intravenous Given 03/06/14 2222)  sodium chloride 0.9 % bolus 1,000 mL (1,000 mLs Intravenous New Bag/Given 03/06/14 2222)  HYDROmorphone (DILAUDID) injection 0.5 mg (0.5 mg Intravenous Given 03/06/14 2222)    [name] is a pleasant 54 y.o. female presenting with abdominal pain worsening over the course of 24 hours associated with melanotic stool and fever with several episodes of nonbloody, nonbilious emesis. Symptoms similar to prior episode of colitis was thought to be secondary to ischemia. Patient with a leukocytosis of 15.3. She appears hemoconcentrated with an H&H of 16 point 4/48. Patient will be fluid bolus, Dilaudid and rectal exam  performed by attending physician shows melanotic stool that is heme positive. Chest x-ray with no signs of infiltrate. UA pending. Protonix bolus in addition to Pepcid. Several doses of Dilaudid have served ease her pain.  Discussed with Dr. Elnoria Howard who will see the patient in morning. [facility] will admit the Pt, requests CT abd pelvis, she will put in holding orders after she sees the patient.      Wynetta Emery, PA-C 03/07/14 0032  Vida Roller, MD 03/09/14 [phone]

## 2014-03-07 ENCOUNTER — Encounter (HOSPITAL_COMMUNITY): Payer: Self-pay | Admitting: Internal Medicine

## 2014-03-07 ENCOUNTER — Emergency Department (HOSPITAL_COMMUNITY): Payer: Medicaid Other

## 2014-03-07 DIAGNOSIS — A419 Sepsis, unspecified organism: Secondary | ICD-10-CM | POA: Diagnosis present

## 2014-03-07 DIAGNOSIS — F17210 Nicotine dependence, cigarettes, uncomplicated: Secondary | ICD-10-CM | POA: Diagnosis present

## 2014-03-07 DIAGNOSIS — Z7982 Long term (current) use of aspirin: Secondary | ICD-10-CM | POA: Diagnosis not present

## 2014-03-07 DIAGNOSIS — Z825 Family history of asthma and other chronic lower respiratory diseases: Secondary | ICD-10-CM | POA: Diagnosis not present

## 2014-03-07 DIAGNOSIS — R1084 Generalized abdominal pain: Secondary | ICD-10-CM | POA: Diagnosis not present

## 2014-03-07 DIAGNOSIS — Z79891 Long term (current) use of opiate analgesic: Secondary | ICD-10-CM | POA: Diagnosis not present

## 2014-03-07 DIAGNOSIS — J9611 Chronic respiratory failure with hypoxia: Secondary | ICD-10-CM | POA: Diagnosis present

## 2014-03-07 DIAGNOSIS — K219 Gastro-esophageal reflux disease without esophagitis: Secondary | ICD-10-CM | POA: Diagnosis present

## 2014-03-07 DIAGNOSIS — K922 Gastrointestinal hemorrhage, unspecified: Secondary | ICD-10-CM | POA: Diagnosis present

## 2014-03-07 DIAGNOSIS — R600 Localized edema: Secondary | ICD-10-CM | POA: Diagnosis present

## 2014-03-07 DIAGNOSIS — Z7952 Long term (current) use of systemic steroids: Secondary | ICD-10-CM | POA: Diagnosis not present

## 2014-03-07 DIAGNOSIS — Z8041 Family history of malignant neoplasm of ovary: Secondary | ICD-10-CM | POA: Diagnosis not present

## 2014-03-07 DIAGNOSIS — Z7951 Long term (current) use of inhaled steroids: Secondary | ICD-10-CM | POA: Diagnosis not present

## 2014-03-07 DIAGNOSIS — J449 Chronic obstructive pulmonary disease, unspecified: Secondary | ICD-10-CM | POA: Diagnosis present

## 2014-03-07 DIAGNOSIS — E860 Dehydration: Secondary | ICD-10-CM | POA: Diagnosis present

## 2014-03-07 DIAGNOSIS — G43909 Migraine, unspecified, not intractable, without status migrainosus: Secondary | ICD-10-CM | POA: Diagnosis present

## 2014-03-07 DIAGNOSIS — K760 Fatty (change of) liver, not elsewhere classified: Secondary | ICD-10-CM | POA: Diagnosis present

## 2014-03-07 DIAGNOSIS — K5900 Constipation, unspecified: Secondary | ICD-10-CM | POA: Diagnosis present

## 2014-03-07 DIAGNOSIS — K529 Noninfective gastroenteritis and colitis, unspecified: Secondary | ICD-10-CM | POA: Diagnosis present

## 2014-03-07 DIAGNOSIS — R1031 Right lower quadrant pain: Secondary | ICD-10-CM

## 2014-03-07 DIAGNOSIS — Z8673 Personal history of transient ischemic attack (TIA), and cerebral infarction without residual deficits: Secondary | ICD-10-CM | POA: Diagnosis not present

## 2014-03-07 DIAGNOSIS — F4310 Post-traumatic stress disorder, unspecified: Secondary | ICD-10-CM | POA: Diagnosis present

## 2014-03-07 DIAGNOSIS — M797 Fibromyalgia: Secondary | ICD-10-CM | POA: Diagnosis present

## 2014-03-07 DIAGNOSIS — Z8719 Personal history of other diseases of the digestive system: Secondary | ICD-10-CM | POA: Diagnosis not present

## 2014-03-07 DIAGNOSIS — R933 Abnormal findings on diagnostic imaging of other parts of digestive tract: Secondary | ICD-10-CM | POA: Insufficient documentation

## 2014-03-07 DIAGNOSIS — M1990 Unspecified osteoarthritis, unspecified site: Secondary | ICD-10-CM | POA: Diagnosis present

## 2014-03-07 DIAGNOSIS — Z79899 Other long term (current) drug therapy: Secondary | ICD-10-CM | POA: Diagnosis not present

## 2014-03-07 DIAGNOSIS — R609 Edema, unspecified: Secondary | ICD-10-CM

## 2014-03-07 DIAGNOSIS — D509 Iron deficiency anemia, unspecified: Secondary | ICD-10-CM | POA: Diagnosis present

## 2014-03-07 DIAGNOSIS — K921 Melena: Secondary | ICD-10-CM | POA: Diagnosis present

## 2014-03-07 LAB — ABO/RH: ABO/RH(D): A NEG

## 2014-03-07 LAB — CBC
HCT: 42 % (ref 36.0–46.0)
HCT: 41.1 % (ref 36.0–46.0)
HCT: 41.2 % (ref 36.0–46.0)
Hemoglobin: 13.4 g/dL (ref 12.0–15.0)
Hemoglobin: 13 g/dL (ref 12.0–15.0)
Hemoglobin: 12.9 g/dL (ref 12.0–15.0)
MCH: 29.5 pg (ref 26.0–34.0)
MCH: 29.5 pg (ref 26.0–34.0)
MCH: 29.7 pg (ref 26.0–34.0)
MCHC: 31.9 g/dL (ref 30.0–36.0)
MCHC: 31.6 g/dL (ref 30.0–36.0)
MCHC: 31.3 g/dL (ref 30.0–36.0)
MCV: 92.5 fL (ref 78.0–100.0)
MCV: 93.4 fL (ref 78.0–100.0)
MCV: 94.7 fL (ref 78.0–100.0)
PLATELETS: 216 10*3/uL (ref 150–400)
PLATELETS: 187 10*3/uL (ref 150–400)
PLATELETS: 193 10*3/uL (ref 150–400)
RBC: 4.54 MIL/uL (ref 3.87–5.11)
RBC: 4.4 MIL/uL (ref 3.87–5.11)
RBC: 4.35 MIL/uL (ref 3.87–5.11)
RDW: 14.8 % (ref 11.5–15.5)
RDW: 14.7 % (ref 11.5–15.5)
RDW: 14.9 % (ref 11.5–15.5)
WBC: 11.9 10*3/uL — ABNORMAL HIGH (ref 4.0–10.5)
WBC: 10 10*3/uL (ref 4.0–10.5)
WBC: 9.4 10*3/uL (ref 4.0–10.5)

## 2014-03-07 LAB — URINALYSIS, ROUTINE W REFLEX MICROSCOPIC
BILIRUBIN URINE: NEGATIVE
Glucose, UA: NEGATIVE mg/dL
Hgb urine dipstick: NEGATIVE
Ketones, ur: NEGATIVE mg/dL
Leukocytes, UA: NEGATIVE
Nitrite: NEGATIVE
Protein, ur: NEGATIVE mg/dL
SPECIFIC GRAVITY, URINE: 1.005 (ref 1.005–1.030)
Urobilinogen, UA: 0.2 mg/dL (ref 0.0–1.0)
pH: 7 (ref 5.0–8.0)

## 2014-03-07 LAB — PHOSPHORUS: Phosphorus: 4.2 mg/dL (ref 2.3–4.6)

## 2014-03-07 LAB — TROPONIN I
Troponin I: <0.03 ng/mL (ref <0.031)
Troponin I: <0.03 ng/mL (ref <0.031)

## 2014-03-07 LAB — COMPREHENSIVE METABOLIC PANEL
ALK PHOS: 77 U/L (ref 39–117)
ALT: 11 U/L (ref 0–35)
AST: 17 U/L (ref 0–37)
Albumin: 3.5 g/dL (ref 3.5–5.2)
Anion gap: 6 (ref 5–15)
BUN: 7 mg/dL (ref 6–23)
CALCIUM: 8.6 mg/dL (ref 8.4–10.5)
CO2: 30 mmol/L (ref 19–32)
Chloride: 102 mEq/L (ref 96–112)
Creatinine, Ser: 0.56 mg/dL (ref 0.50–1.10)
GFR calc Af Amer: >90 mL/min (ref >90)
GFR calc non Af Amer: >90 mL/min (ref >90)
GLUCOSE: 100 mg/dL — AB (ref 70–99)
POTASSIUM: 3.9 mmol/L (ref 3.5–5.1)
SODIUM: 138 mmol/L (ref 135–145)
TOTAL PROTEIN: 6.2 g/dL (ref 6.0–8.3)
Total Bilirubin: 0.8 mg/dL (ref 0.3–1.2)

## 2014-03-07 LAB — PROCALCITONIN

## 2014-03-07 LAB — TSH: TSH: 5.225 u[IU]/mL — ABNORMAL HIGH (ref 0.350–4.500)

## 2014-03-07 LAB — TYPE AND SCREEN
ABO/RH(D): A NEG
Antibody Screen: NEGATIVE

## 2014-03-07 LAB — APTT: aPTT: 31 seconds (ref 24–37)

## 2014-03-07 LAB — MAGNESIUM: Magnesium: 1.9 mg/dL (ref 1.5–2.5)

## 2014-03-07 LAB — PROTIME-INR
INR: 1.05 (ref 0.00–1.49)
Prothrombin Time: 13.8 seconds (ref 11.6–15.2)

## 2014-03-07 LAB — MRSA PCR SCREENING: MRSA by PCR: NEGATIVE

## 2014-03-07 MED ORDER — ACETAMINOPHEN 325 MG PO TABS
650.0000 mg | ORAL_TABLET | Freq: Four times a day (QID) | ORAL | Status: DC | PRN
  Administered 2014-03-08: 650 mg via ORAL
  Filled 2014-03-07: qty 2

## 2014-03-07 MED ORDER — OXYCODONE HCL 5 MG PO TABS
15.0000 mg | ORAL_TABLET | Freq: Four times a day (QID) | ORAL | Status: DC | PRN
  Administered 2014-03-07 – 2014-03-08 (×2): 15 mg via ORAL
  Filled 2014-03-07 (×2): qty 3

## 2014-03-07 MED ORDER — ALBUTEROL SULFATE (2.5 MG/3ML) 0.083% IN NEBU: 2.5000 mg | INHALATION_SOLUTION | RESPIRATORY_TRACT | Status: DC | PRN

## 2014-03-07 MED ORDER — DIAZEPAM 5 MG PO TABS: 10.0000 mg | ORAL_TABLET | Freq: Two times a day (BID) | ORAL | Status: DC | PRN

## 2014-03-07 MED ORDER — SODIUM CHLORIDE 0.9 % IV BOLUS (SEPSIS): 1000.0000 mL | Freq: Once | INTRAVENOUS | Status: DC

## 2014-03-07 MED ORDER — IPRATROPIUM-ALBUTEROL 0.5-2.5 (3) MG/3ML IN SOLN
3.0000 mL | Freq: Three times a day (TID) | RESPIRATORY_TRACT | Status: DC
  Administered 2014-03-07 – 2014-03-08 (×5): 3 mL via RESPIRATORY_TRACT
  Filled 2014-03-07 (×5): qty 3

## 2014-03-07 MED ORDER — ONDANSETRON HCL 4 MG PO TABS: 4.0000 mg | ORAL_TABLET | Freq: Four times a day (QID) | ORAL | Status: DC | PRN

## 2014-03-07 MED ORDER — FLUTICASONE PROPIONATE 50 MCG/ACT NA SUSP
2.0000 | Freq: Once | NASAL | Status: AC
Start: 2014-03-07 — End: 2014-03-08
  Administered 2014-03-08: 2 via NASAL
  Filled 2014-03-07: qty 16

## 2014-03-07 MED ORDER — SODIUM CHLORIDE 0.9 % IJ SOLN
3.0000 mL | Freq: Two times a day (BID) | INTRAMUSCULAR | Status: DC
  Administered 2014-03-07 – 2014-03-08 (×3): 3 mL via INTRAVENOUS

## 2014-03-07 MED ORDER — MIRTAZAPINE 15 MG PO TABS
15.0000 mg | ORAL_TABLET | Freq: Every day | ORAL | Status: DC
Start: 2014-03-07 — End: 2014-03-08
  Administered 2014-03-07: 15 mg via ORAL
  Filled 2014-03-07 (×3): qty 1

## 2014-03-07 MED ORDER — GUAIFENESIN ER 600 MG PO TB12
600.0000 mg | ORAL_TABLET | Freq: Two times a day (BID) | ORAL | Status: DC
  Administered 2014-03-07 – 2014-03-08 (×3): 600 mg via ORAL
  Filled 2014-03-07 (×5): qty 1

## 2014-03-07 MED ORDER — NICOTINE POLACRILEX 2 MG MT GUM
2.0000 mg | CHEWING_GUM | OROMUCOSAL | Status: DC | PRN
  Administered 2014-03-07: 2 mg via ORAL
  Filled 2014-03-07 (×2): qty 1

## 2014-03-07 MED ORDER — PROMETHAZINE HCL 25 MG PO TABS: 12.5000 mg | ORAL_TABLET | Freq: Four times a day (QID) | ORAL | Status: DC | PRN

## 2014-03-07 MED ORDER — NICOTINE 7 MG/24HR TD PT24
7.0000 mg | MEDICATED_PATCH | Freq: Every day | TRANSDERMAL | Status: DC
  Administered 2014-03-08: 7 mg via TRANSDERMAL
  Filled 2014-03-07 (×2): qty 1

## 2014-03-07 MED ORDER — PIPERACILLIN-TAZOBACTAM 3.375 G IVPB
3.3750 g | Freq: Three times a day (TID) | INTRAVENOUS | Status: DC
  Administered 2014-03-07 – 2014-03-08 (×4): 3.375 g via INTRAVENOUS
  Filled 2014-03-07 (×5): qty 50

## 2014-03-07 MED ORDER — DM-GUAIFENESIN ER 30-600 MG PO TB12
1.0000 | ORAL_TABLET | Freq: Two times a day (BID) | ORAL | Status: DC | PRN
  Administered 2014-03-07: 1 via ORAL
  Filled 2014-03-07 (×2): qty 1

## 2014-03-07 MED ORDER — LORAZEPAM 2 MG/ML IJ SOLN
0.5000 mg | Freq: Once | INTRAMUSCULAR | Status: AC
  Administered 2014-03-07: 0.5 mg via INTRAVENOUS
  Filled 2014-03-07: qty 1

## 2014-03-07 MED ORDER — IOHEXOL 300 MG/ML  SOLN
50.0000 mL | Freq: Once | INTRAMUSCULAR | Status: AC | PRN
  Administered 2014-03-07: 50 mL via ORAL

## 2014-03-07 MED ORDER — SODIUM CHLORIDE 0.9 % IV BOLUS (SEPSIS): 500.0000 mL | Freq: Once | INTRAVENOUS | Status: DC

## 2014-03-07 MED ORDER — HYDROMORPHONE HCL 1 MG/ML IJ SOLN
1.0000 mg | INTRAMUSCULAR | Status: DC | PRN
  Administered 2014-03-07 – 2014-03-08 (×6): 1 mg via INTRAVENOUS
  Filled 2014-03-07 (×6): qty 1

## 2014-03-07 MED ORDER — LORAZEPAM 2 MG/ML IJ SOLN
0.5000 mg | Freq: Two times a day (BID) | INTRAMUSCULAR | Status: DC | PRN
  Administered 2014-03-07 – 2014-03-08 (×3): 0.5 mg via INTRAVENOUS
  Filled 2014-03-07 (×3): qty 1

## 2014-03-07 MED ORDER — PANTOPRAZOLE SODIUM 40 MG IV SOLR
40.0000 mg | Freq: Two times a day (BID) | INTRAVENOUS | Status: DC
  Administered 2014-03-07 – 2014-03-08 (×3): 40 mg via INTRAVENOUS
  Filled 2014-03-07 (×5): qty 40

## 2014-03-07 MED ORDER — SALINE SPRAY 0.65 % NA SOLN
1.0000 | NASAL | Status: DC | PRN
  Filled 2014-03-07: qty 44

## 2014-03-07 MED ORDER — CETYLPYRIDINIUM CHLORIDE 0.05 % MT LIQD
7.0000 mL | Freq: Two times a day (BID) | OROMUCOSAL | Status: DC
  Administered 2014-03-07 – 2014-03-08 (×3): 7 mL via OROMUCOSAL

## 2014-03-07 MED ORDER — OXYMETAZOLINE HCL 0.05 % NA SOLN
1.0000 | Freq: Once | NASAL | Status: AC
  Administered 2014-03-07: 1 via NASAL
  Filled 2014-03-07: qty 15

## 2014-03-07 MED ORDER — CYCLOBENZAPRINE HCL 5 MG PO TABS
5.0000 mg | ORAL_TABLET | Freq: Once | ORAL | Status: AC
  Administered 2014-03-08: 5 mg via ORAL
  Filled 2014-03-07: qty 1

## 2014-03-07 MED ORDER — IPRATROPIUM BROMIDE 0.02 % IN SOLN
0.5000 mg | Freq: Four times a day (QID) | RESPIRATORY_TRACT | Status: DC
  Administered 2014-03-07: 0.5 mg via RESPIRATORY_TRACT
  Filled 2014-03-07: qty 2.5

## 2014-03-07 MED ORDER — IOHEXOL 300 MG/ML  SOLN
100.0000 mL | Freq: Once | INTRAMUSCULAR | Status: AC | PRN
  Administered 2014-03-07: 100 mL via INTRAVENOUS

## 2014-03-07 MED ORDER — MOMETASONE FURO-FORMOTEROL FUM 100-5 MCG/ACT IN AERO
2.0000 | INHALATION_SPRAY | Freq: Two times a day (BID) | RESPIRATORY_TRACT | Status: DC
  Administered 2014-03-07 – 2014-03-08 (×3): 2 via RESPIRATORY_TRACT
  Filled 2014-03-07: qty 8.8

## 2014-03-07 MED ORDER — ACETAMINOPHEN 650 MG RE SUPP: 650.0000 mg | Freq: Four times a day (QID) | RECTAL | Status: DC | PRN

## 2014-03-07 MED ORDER — PIPERACILLIN-TAZOBACTAM 3.375 G IVPB 30 MIN
3.3750 g | Freq: Three times a day (TID) | INTRAVENOUS | Status: DC
  Filled 2014-03-07: qty 50

## 2014-03-07 MED ORDER — ONDANSETRON HCL 4 MG/2ML IJ SOLN: 4.0000 mg | Freq: Four times a day (QID) | INTRAMUSCULAR | Status: DC | PRN

## 2014-03-07 MED ORDER — SODIUM CHLORIDE 0.9 % IV SOLN
INTRAVENOUS | Status: DC
  Administered 2014-03-07: 09:00:00 via INTRAVENOUS

## 2014-03-07 MED ORDER — PROMETHAZINE HCL 25 MG/ML IJ SOLN
12.5000 mg | Freq: Four times a day (QID) | INTRAMUSCULAR | Status: DC | PRN
  Administered 2014-03-07 – 2014-03-08 (×2): 12.5 mg via INTRAVENOUS
  Filled 2014-03-07 (×2): qty 1

## 2014-03-07 MED ORDER — PROMETHAZINE HCL 25 MG RE SUPP
25.0000 mg | Freq: Four times a day (QID) | RECTAL | Status: DC | PRN
Start: 2014-03-07 — End: 2014-03-08

## 2014-03-07 NOTE — Progress Notes (Addendum)
Patient ID: [name], female   DOB: [birth year], 54 y.o.   MRN: [id number]  [facility] PROGRESS NOTE  [name] UTM:[id number] DOB: [birth year] DOA: 03/06/2014 PCP: Leonard Downing, MD  Brief narrative:  Addendum to admission note done 03/07/2014 54 year old female with past medical history of COPD, chronic resp failure-no longer on continuous O2, tobacco abuse, VDRF in January 2015 secondary to metapneumovirus, bipolar depression, hx/o TIA, GERD who presented to [facility] ED with worsening abdominal pain in right upper quadrant and epigastric area which started the night prior to this admission and woke her up from sleep. Patient reported associated fevers at home, T max 101.3 F, nausea, non bloody vomiting. She did reports dark stool and some red blood in stool. Patient has had colonoscopy in 2011 by Dr. [name] and at that time it showed suspicion for colonic inertia. Flexible sigmoidoscopy was done by Dr. [name] in 10/2012 and showed non bleeding mucosal ulcerations in the descending colon and splenic flexure that looked like ischemic colitis; biopsies confirmed ischemic type mucosal injury. On admission, BP was 84/41, HR 76 - 108, RR 12-25, T max 98.7 F and oxygen saturation 89-100% with [place] oxygen support. Blood work revealed WBC count of 15.3, hemoglobin 16.4, otehrwise unremarkable. CT abdomen showed prominent wall thickening and edema of the cecum probably indicating focal colitis but cecal mass not excluded; stool-filled colon to the level of the mid transverse colon with decompression of the distal colon; mass or stricture at this level is not excluded and colonoscopy is suggested for further evaluation; mild wall thickening in the bladder may indicate cystitis.   Assessment and Plan:    Principal Problem: Abdominal pain / possible colitis - patient admitted with abdominal pain and CT abdomen suggestive of colitis. As noted patient has had flexible sigmoidoscopy was done by Dr.  [name] in 10/2012 and showed non bleeding mucosal ulcerations in the descending colon and splenic flexure that looked like ischemic colitis; biopsies confirmed ischemic type mucosal injury. - patient started on zosyn - GI consulted and appreciate their input  - continue supportive care with IV fluids, analgesia and antiemetics as needed  Active Problems: Sepsis - sepsis secondary to possible colitis and questionable cystitis as seen on CT abdomen - sepsis criteria met on admission with hypotension, tachycardia, tachypnea, hypoxia. In addition, WBC count elevated at 15.3. Source of infection - colitis and possible cystitis. UA did not show evidence of TI. CXR did not reveal acute cardiopulmonary process  - pt on zosyn currently   COPD (chronic obstructive pulmonary disease) / tobacco abuse  - respiratory status is stable at this time.  - continue scheduled Duoneb TID, Dulera inhaler BID - nicotine patch ordered - counseled on smoking cessation   Possible GI bleed - pt has had complaints of dark stool and some red blood per rectum - FOBT (+) - hemoglobin stable  Mild elevation in  TSH  - TSH 5.225 - needs to be repeated outpt in about 4 weeks   LE edema, left - follow up LE doppler study   DVT prophylaxis  SCD's while pt is in hospital due to risk of bleeding.  Code Status: Full Family Communication: family not at the bedside this am Disposition Plan: Home when medically stable   IV Access:   Peripheral IV  Procedures and diagnostic studies:   Ct Abdomen Pelvis W Contrast  03/07/2014   Prominent wall thickening and edema of the cecum probably indicating focal colitis but cecal mass not  excluded. Stool-filled colon to the level of the mid transverse colon with decompression of the distal colon. Mass or stricture this level is not excluded and colonoscopy is suggested for further evaluation. Mild wall thickening in the bladder may indicate cystitis. Focal fatty infiltration in  the liver.     Dg Chest Port 1 View  03/06/2014  Chronic emphysematous changes and pulmonary scarring but no definite acute overlying pulmonary process.    Medical Consultants:   GI  Other Consultants:   None    Anti-Infectives:   Zosyn 03/07/2014 -->    Faye Ramsay, MD  [facility] Pager [phone]  If 7PM-7AM, please contact night-coverage [email or web address] Password [facility] 03/07/2014, 8:39 AM   LOS: 1 day   HPI/Subjective: No events overnight.   Objective: Filed Vitals:   03/07/14 0500 03/07/14 0600 03/07/14 0700 03/07/14 0800  BP: 87/54 84/41 96/51  94/52  Pulse: 78 76 71 79  Temp:      TempSrc:      Resp: 15 16 14 18   Height:      Weight:      SpO2: 98% 98% 100% 100%    Intake/Output Summary (Last 24 hours) at 03/07/14 0839 Last data filed at 03/07/14 0800  Gross per 24 hour  Intake 693.75 ml  Output   1000 ml  Net -306.25 ml    Exam:   General:  Pt is alert, follows commands appropriately, not in acute distress  Cardiovascular: Regular rate and rhythm, S1/S2 (+)  Respiratory: bilateral air entry, (+) wheezing   Abdomen: epigastric and lower abdomen TTP, non distended, bowel sounds present, no guarding  Extremities: Pulses DP and PT palpable bilaterally  Neuro: Grossly nonfocal  Data Reviewed: Basic Metabolic Panel:  Recent Labs Lab 03/06/14 2146 03/07/14 0311  NA 133* 138  K 3.9 3.9  CL 97 102  CO2 29 30  GLUCOSE 138* 100*  BUN 8 7  CREATININE 0.68 0.56  CALCIUM 9.3 8.6  MG  --  1.9  PHOS  --  4.2   Liver Function Tests:  Recent Labs Lab 03/06/14 2146 03/07/14 0311  AST 25 17  ALT 13 11  ALKPHOS 94 77  BILITOT 0.8 0.8  PROT 7.7 6.2  ALBUMIN 4.4 3.5    Recent Labs Lab 03/06/14 2146  LIPASE 19   CBC:  Recent Labs Lab 03/06/14 2146 03/07/14 0315  WBC 15.3* 11.9*  NEUTROABS 12.2*  --   HGB 16.4* 13.4  HCT 48.5* 42.0  MCV 90.5 92.5  PLT 226 216   Cardiac Enzymes:  Recent Labs Lab 03/07/14 0315  TROPONINI <0.03      Recent Results (from the past 240 hour(s))  MRSA PCR Screening     Status: None   Collection Time: 03/07/14  3:07 AM  Result Value Ref Range Status   MRSA by PCR NEGATIVE NEGATIVE Final     Scheduled Meds: . antiseptic oral rinse  7 mL Mouth Rinse BID  . guaiFENesin  600 mg Oral BID  . ipratropium-albuterol  3 mL Nebulization TID  . mirtazapine  15 mg Oral QHS  . mometasone-formoterol  2 puff Inhalation BID  . nicotine  7 mg Transdermal Daily  . pantoprazole (PROTONIX) IV  40 mg Intravenous Q12H  . sodium chloride  1,000 mL Intravenous Once  . sodium chloride  500 mL Intravenous Once  . sodium chloride  3 mL Intravenous Q12H   Continuous Infusions: . sodium chloride 125 mL/hr at 03/07/14 0251

## 2014-03-07 NOTE — H&P (Signed)
PCP: Kaleen Mask, MD    Chief Complaint:  Abdominal pain, blood in stool  HPI: [name] is a 54 y.o. female   has a past medical history of TIA (transient ischemic attack); GERD (gastroesophageal reflux disease); Hepatomegaly; Colonic inertia; Lung abscess; COPD (chronic obstructive pulmonary disease); Stroke (2009); PTSD (post-traumatic stress disorder); Bipolar disorder; Depression; Chronic headache; Seizures; Fibromyalgia; Iron deficiency anemia; Arthritis; Acute ischemic colitis (11/01/2012); Marijuana abuse (2014); and TIA (transient ischemic attack).   Presented with  Last night she started to have abdominal pain right upper quadrant and epigastric pain. She could not sleep due to pain. This AM  03/06/13 she woke up and started to had some red blood in stool. Patient developed low grade fever up to 101.3. She took some tylenol. Patient reported one episode of vomiting no bloody or coffee ground emesis. She had some dark stool there after.  In emergency department blood pressure noted down to 9465 pulse 108 white blood cell count 15.3 altogether worrisome for sepsis.  In the past patient reports possible ischemic colitis due to withdrawal from Cymbalta. Per review of reccords in August 2014 she had a CT scan showing colitis repeat CT scan in October 2014 showed resolution of colitis with no evidence of ischemic colitis and no significant mesenteric atherosclerosis to suggest the patient is predisposed to ischemic colitis. Patient denies history of chronic abdominal pain but she have had frequent visits with reports of abdominal pain. She have had passed to admission secondary to hypoxic hypercarbic respiratory failure weak requiring stepdown unit stay and BiPAP. Patient continues to smoke. Hospitalist was called for admission for  Lower Gi Bleed and possible sepsis  Review of Systems:    Pertinent positives include:  Fevers, chills, abdominal pain, nausea, vomiting, blood in  stool  Constitutional:  No weight loss, night sweats,  fatigue, weight loss  HEENT:  No headaches, Difficulty swallowing,Tooth/dental problems,Sore throat,  No sneezing, itching, ear ache, nasal congestion, post nasal drip,  Cardio-vascular:  No chest pain, Orthopnea, PND, anasarca, dizziness, palpitations.no Bilateral lower extremity swelling  GI:  No heartburn, indigestion, diarrhea, change in bowel habits, loss of appetite, melena, , hematemesis Resp:  no shortness of breath at rest. No dyspnea on exertion, No excess mucus, no productive cough, No non-productive cough, No coughing up of blood.No change in color of mucus.No wheezing. Skin:  no rash or lesions. No jaundice GU:  no dysuria, change in color of urine, no urgency or frequency. No straining to urinate.  No flank pain.  Musculoskeletal:  No joint pain or no joint swelling. No decreased range of motion. No back pain.  Psych:  No change in mood or affect. No depression or anxiety. No memory loss.  Neuro: no localizing neurological complaints, no tingling, no weakness, no double vision, no gait abnormality, no slurred speech, no confusion  Otherwise ROS are negative except for above, 10 systems were reviewed  Past Medical History: Past Medical History  Diagnosis Date  . TIA (transient ischemic attack)   . GERD (gastroesophageal reflux disease)   . Hepatomegaly     hx  . Colonic inertia   . Lung abscess     "calcified over"; bilateral   . COPD (chronic obstructive pulmonary disease)   . Stroke 2009    TIA  . PTSD (post-traumatic stress disorder)   . Bipolar disorder   . Depression   . Chronic headache   . Seizures     in past  . Fibromyalgia   .  Iron deficiency anemia   . Arthritis   . Acute ischemic colitis 11/01/2012  . Marijuana abuse 2014    positive screen in hospital  . TIA (transient ischemic attack)    Past Surgical History  Procedure Laterality Date  . 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 Cesarean section      x 2  . Esophagogastroduodenoscopy  12/23/2011    Procedure: ESOPHAGOGASTRODUODENOSCOPY (EGD);  Surgeon: Hart Carwin, MD;  Location: [facility];  Service: Endoscopy;  Laterality: N/A;  . Colonoscopy    . Colonoscopy N/A 11/01/2012    Procedure: COLONOSCOPY;  Surgeon: Iva Boop, MD;  Location: [facility];  Service: Endoscopy;  Laterality: N/A;     Medications: Prior to Admission medications   Medication Sig Start Date End Date Taking? Authorizing Provider  acetaminophen (TYLENOL) 500 MG tablet Take 1,000 mg by mouth every 6 (six) hours as needed for moderate pain.   Yes Historical Provider, MD  albuterol (PROVENTIL) (2.5 MG/3ML) 0.083% nebulizer solution Take 3 mLs (2.5 mg total) by nebulization every 6 (six) hours as needed for shortness of breath. For shortness of breath 12/20/13  Yes Alison Murray, MD  albuterol (VENTOLIN HFA) 108 (90 BASE) MCG/ACT inhaler Inhale 2 puffs into the lungs every 6 (six) hours as needed for wheezing or shortness of breath.    Yes Historical Provider, MD  antiseptic oral rinse (CPC / CETYLPYRIDINIUM CHLORIDE 0.05%) 0.05 % LIQD solution 7 mLs by Mouth Rinse route 2 times daily at 12 noon and 4 pm. 12/20/13  Yes Alison Murray, MD  aspirin EC 81 MG tablet Take 1 tablet (81 mg total) by mouth daily. 10/14/12  Yes Costin Otelia Sergeant, MD  chlorhexidine (PERIDEX) 0.12 % solution 15 mLs by Mouth Rinse route 2 (two) times daily. 12/20/13  Yes Alison Murray, MD  Cholecalciferol (VITAMIN D-3) 5000 UNITS TABS Take 5,000 Units by mouth daily.   Yes Historical Provider, MD  cyclobenzaprine (FLEXERIL) 5 MG tablet Take 1 tablet (5 mg total) by mouth 3 (three) times daily as needed for muscle spasms. 12/20/13  Yes Alison Murray, MD  dextromethorphan-guaiFENesin [facility] DM) 30-600 MG per 12 hr tablet Take 1 tablet by mouth 2 (two) times daily as needed for cough. 12/20/13  Yes Alison Murray, MD  diazepam (VALIUM) 10 MG  tablet Take 1 tablet (10 mg total) by mouth every 12 (twelve) hours as needed for anxiety. 12/20/13  Yes Alison Murray, MD  dicyclomine (BENTYL) 20 MG tablet Take 1 tablet (20 mg total) by mouth 3 (three) times daily before meals. 11/02/12  Yes Dorothea Ogle, MD  docusate sodium 100 MG CAPS Take 100 mg by mouth daily as needed for mild constipation. 12/20/13  Yes Alison Murray, MD  fluticasone [facility]) 50 MCG/ACT nasal spray Place 2 sprays into both nostrils daily as needed for allergies or rhinitis.   Yes Historical Provider, MD  lactose free nutrition (BOOST PLUS) LIQD Take 237 mLs by mouth 2 (two) times daily between meals. 04/03/13  Yes Nishant Dhungel, MD  lidocaine (XYLOCAINE) 5 % ointment Apply 1 application topically 2 (two) times daily as needed for mild pain.   Yes Historical Provider, MD  loratadine (CLARITIN) 10 MG tablet Take 10 mg by mouth every evening.    Yes Historical Provider, MD  mirtazapine (REMERON) 15 MG tablet Take 1 tablet (15 mg total) by mouth at bedtime.  12/20/13  Yes Alison Murray, MD  mometasone (NASONEX) 50 MCG/ACT nasal spray Place 2 sprays into the nose daily. 01/04/14  Yes Loren Racer, MD  mometasone-formoterol (DULERA) 100-5 MCG/ACT AERO Inhale 2 puffs into the lungs 2 (two) times daily.   Yes Historical Provider, MD  oxyCODONE (ROXICODONE) 15 MG immediate release tablet Take 1 tablet (15 mg total) by mouth every 6 (six) hours as needed for pain (back pain). 12/20/13  Yes Alison Murray, MD  oxymetazoline (AFRIN NASAL SPRAY) 0.05 % nasal spray Place 1 spray into both nostrils 2 (two) times daily. 01/04/14  Yes Loren Racer, MD  pantoprazole (PROTONIX) 40 MG tablet Take 1 tablet (40 mg total) by mouth daily at 6 (six) AM. 11/02/12  Yes Dorothea Ogle, MD  promethazine (PHENERGAN) 25 MG tablet Take 0.5-1 tablets (12.5-25 mg total) by mouth every 6 (six) hours as needed for nausea. 12/20/13  Yes Alison Murray, MD  tiotropium (SPIRIVA) 18 MCG inhalation capsule Place 18 mcg  into inhaler and inhale daily.   Yes Historical Provider, MD  VITAMIN E PO Take 1 capsule by mouth daily.   Yes Historical Provider, MD  DULERA 100-5 MCG/ACT AERO inhale 2 puffs by mouth twice a day Patient not taking: Reported on 03/06/2014    Barbaraann Share, MD  predniSONE (DELTASONE) 10 MG tablet Take 3 tablets (30 mg total) by mouth daily with breakfast. Patient not taking: Reported on 03/06/2014 12/21/13   Alison Murray, MD  predniSONE (DELTASONE) 20 MG tablet 3 tabs po day one, then 2 po daily x 4 days Patient not taking: Reported on 03/06/2014 01/04/14   Loren Racer, MD  [facility] HANDIHALER 18 MCG inhalation capsule inhale contents of 1 capsule by mouth once daily Patient not taking: Reported on 03/06/2014 12/01/12   Barbaraann Share, MD    Allergies:   Allergies  Allergen Reactions  . Oxycodone-Acetaminophen Anaphylaxis, Itching and Nausea And Vomiting    Tylox (Pt states, "I can only take Demerol, Oxycontin, and Dilaudid")  . Codeine Nausea And Vomiting  . Hydrocodone Nausea And Vomiting  . Quinolones Hives    Social History:  Ambulatory   Independently  Lives at home With family    reports that she has been smoking Cigarettes.  She has a 39 pack-year smoking history. She has never used smokeless tobacco. She reports that she does not drink alcohol or use illicit drugs.    Family History: family history includes Asthma in her son; Clotting disorder in her mother; Emphysema in her father; Ovarian cancer in her sister; Rheum arthritis in her brother and sister.    Physical Exam: Patient Vitals for the past 24 hrs:  BP Temp Temp src Pulse Resp SpO2 Height Weight  03/06/14 2330 104/60 mmHg - - - 21 - - -  03/06/14 2300 118/68 mmHg - - 98 21 100 % - -  03/06/14 2251 112/64 mmHg - - 100 16 100 % - -  03/06/14 2230 94/65 mmHg - - 108 25 97 % - -  03/06/14 2124 115/79 mmHg 98.3 F (36.8 C) Oral - 20 94 % 5\' 7"  (1.702 m) 56.7 kg (125 lb)    1. General:  in No Acute distress 2.  Psychological: Alert and   Oriented 3. Head/ENT:     Dry Mucous Membranes                          Head Non traumatic, neck  supple                          Normal  Dentition 4. SKIN:   decreased Skin turgor,  Skin clean Dry and intact no rash 5. Heart: Rapid but Regular rate and rhythm no Murmur, Rub or gallop 6. Lungs: Some wheezes bilaterally no crackles   7. Abdomen: Soft, epigastric tender and right lower quadrant tenderness, Non distended 8. Lower extremities: no clubbing, cyanosis, trace edema of left lower extremity 9. Neurologically Grossly intact, moving all 4 extremities equally 10. MSK: Normal range of motion Hemoccult positive body mass index is 19.57 kg/(m^2).   Labs on Admission:   Results for orders placed or performed during the hospital encounter of 03/06/14 (from the past 24 hour(s))  CBC with Differential     Status: Abnormal   Collection Time: 03/06/14  9:46 PM  Result Value Ref Range   WBC 15.3 (H) 4.0 - 10.5 K/uL   RBC 5.36 (H) 3.87 - 5.11 MIL/uL   Hemoglobin 16.4 (H) 12.0 - 15.0 g/dL   HCT 40.9 (H) 81.1 - 91.4 %   MCV 90.5 78.0 - 100.0 fL   MCH 30.6 26.0 - 34.0 pg   MCHC 33.8 30.0 - 36.0 g/dL   RDW 78.2 95.6 - 21.3 %   Platelets 226 150 - 400 K/uL   Neutrophils Relative % 80 (H) 43 - 77 %   Neutro Abs 12.2 (H) 1.7 - 7.7 K/uL   Lymphocytes Relative 13 12 - 46 %   Lymphs Abs 1.9 0.7 - 4.0 K/uL   Monocytes Relative 7 3 - 12 %   Monocytes Absolute 1.1 (H) 0.1 - 1.0 K/uL   Eosinophils Relative 0 0 - 5 %   Eosinophils Absolute 0.0 0.0 - 0.7 K/uL   Basophils Relative 0 0 - 1 %   Basophils Absolute 0.0 0.0 - 0.1 K/uL  Comprehensive metabolic panel     Status: Abnormal   Collection Time: 03/06/14  9:46 PM  Result Value Ref Range   Sodium 133 (L) 135 - 145 mmol/L   Potassium 3.9 3.5 - 5.1 mmol/L   Chloride 97 96 - 112 mEq/L   CO2 29 19 - 32 mmol/L   Glucose, Bld 138 (H) 70 - 99 mg/dL   BUN 8 6 - 23 mg/dL   Creatinine, Ser 0.86 0.50 - 1.10 mg/dL   Calcium  9.3 8.4 - 57.8 mg/dL   Total Protein 7.7 6.0 - 8.3 g/dL   Albumin 4.4 3.5 - 5.2 g/dL   AST 25 0 - 37 U/L   ALT 13 0 - 35 U/L   Alkaline Phosphatase 94 39 - 117 U/L   Total Bilirubin 0.8 0.3 - 1.2 mg/dL   GFR calc non Af Amer >90 >90 mL/min   GFR calc Af Amer >90 >90 mL/min   Anion gap 7 5 - 15  Lipase, blood     Status: None   Collection Time: 03/06/14  9:46 PM  Result Value Ref Range   Lipase 19 11 - 59 U/L  I-stat troponin, ED (only if pt is 54 y.o. or older & pain is above umbilicus) - do not order at [facility]     Status: None   Collection Time: 03/06/14 10:11 PM  Result Value Ref Range   Troponin i, poc 0.00 0.00 - 0.08 ng/mL   Comment 3          I-Stat CG4 Lactic Acid, ED  Status: None   Collection Time: 03/06/14 10:16 PM  Result Value Ref Range   Lactic Acid, Venous 2.12 0.5 - 2.2 mmol/L  POC occult blood, ED     Status: Abnormal   Collection Time: 03/06/14 10:18 PM  Result Value Ref Range   Fecal Occult Bld POSITIVE (A) NEGATIVE    UA not obtained but ordered  Lab Results  Component Value Date   HGBA1C 5.6 10/13/2012    Estimated Creatinine Clearance: 72.8 mL/min (by C-G formula based on Cr of 0.68).  BNP (last 3 results)  Recent Labs  03/26/13 0401 03/30/13 0330 01/04/14 0042  PROBNP 1536.0* 136.4* 46.5    Other results:  I have pearsonaly reviewed this: ECG REPORT  Rate: 121  Rhythm: ST ST&T Change: no ischemic changes   Filed Weights   03/06/14 2124  Weight: 56.7 kg (125 lb)     Cultures:    Component Value Date/Time   SDES SPUTUM 03/30/2013 1548   SDES SPUTUM 03/30/2013 1548   SPECREQUEST Normal 03/30/2013 1548   SPECREQUEST NONE 03/30/2013 1548   CULT  03/30/2013 1548    NORMAL OROPHARYNGEAL FLORA Performed at [facility]   REPTSTATUS 03/30/2013 FINAL 03/30/2013 1548   REPTSTATUS 04/02/2013 FINAL 03/30/2013 1548     Radiological Exams on Admission: Ct Abdomen Pelvis W Contrast  03/07/2014   CLINICAL DATA:  Diffuse  abdominal pain, fever, nausea and vomiting, blood in stool. White cell count 15.3. History of GERD, acute ischemic colitis, colonic inertia, and enlarged liver.  EXAM: CT ABDOMEN AND PELVIS WITH CONTRAST  TECHNIQUE: Multidetector CT imaging of the abdomen and pelvis was performed using the standard protocol following bolus administration of intravenous contrast.  CONTRAST:  OMNIPAQUE IOHEXOL 300 MG/ML SOLN, 81mL OMNIPAQUE IOHEXOL 300 MG/ML SOLN  COMPARISON:  06/21/2013  FINDINGS: Lung bases are clear.  Calcified granulomas in the spleen. Focal low-attenuation change in the liver adjacent to the falciform ligament consistent with focal fatty infiltration. No other focal liver lesions identified. The gallbladder, pancreas, adrenal glands, kidneys, inferior vena cava, and retroperitoneal lymph nodes are unremarkable. Calcification of the abdominal aorta without aneurysm. Stomach and small bowel are normal although under distention limits evaluation. Interval development of diffuse wall thickening and edema in the cecum. This may represent focal colitis although developing cecal mass is not excluded. Ascending and proximal transverse colon are stool filled with transition zone to decompressed colon in the distal transverse colon and descending region. Obstructing lesion at the mid transverse colon is not excluded. Consider colonoscopy for further evaluation. No free air or free fluid in the abdomen.  Pelvis: Mild diffuse bladder wall thickening may indicate cystitis. Uterus and ovaries are not enlarged. No free or loculated pelvic fluid collections. Rectosigmoid colon is decompressed. No pelvic mass or lymphadenopathy. No destructive bone lesions. Normal alignment of the lumbar spine.  IMPRESSION: Prominent wall thickening and edema of the cecum probably indicating focal colitis but cecal mass not excluded. Stool-filled colon to the level of the mid transverse colon with decompression of the distal colon. Mass or  stricture this level is not excluded and colonoscopy is suggested for further evaluation. Mild wall thickening in the bladder may indicate cystitis. Focal fatty infiltration in the liver.   Electronically Signed   By: Burman Nieves M.D.   On: 03/07/2014 01:58   Dg Chest Port 1 View  03/06/2014   CLINICAL DATA:  Fever, shortness of breath and left-sided chest pain.  EXAM: PORTABLE CHEST - 1 VIEW  COMPARISON:  01/04/2014  FINDINGS: The cardiac silhouette, mediastinal and hilar contours are within normal limits and stable. There are severe chronic lung changes with emphysema and pulmonary scarring. Stable calcified granuloma in the left upper lobe. No definite acute pulmonary findings. No pleural effusion. The bony thorax is intact.  IMPRESSION: Chronic emphysematous changes and pulmonary scarring but no definite acute overlying pulmonary process.   Electronically Signed   By: Loralie Champagne M.D.   On: 03/06/2014 22:16    Chart has been reviewed  Assessment/Plan  54 year old female with history of severe COPD requiring BiPAP in the past, history of colitis in August 2014, presents with likely lower GI bleed and sepsis with transient hypotension, CT scan of abdomen showed recurrent colitis, GI is aware  Present on Admission:  Colitis - CT scan significant for colitis although mass could not be excluded. GI is aware we'll seen in the morning. Patient is on Zosyn. Given possible sepsis we'll give IV fluids obtain blood cultures, lactic acid within normal limits which is reassuring. Obtain procalcitonin . Lower GI bleed -hemoglobin stable but given vital signs changes will monitor overnight and step down. GI is aware will see in a.m. we will obtain type and screen, follow serial CBC, Protonix IV frequent vital signs  . COPD (chronic obstructive pulmonary disease) - she has some baseline wheezing but currently no respiratory distress. We'll continue home nebulizer treatments.  . Dehydration - patient with  poor by mouth intake and nausea. We'll give IV fluids  . Abdominal pain- likely secondary to colitis. This also evidence of constipation. Will order bowel rest for now, treat colitis with IV antibiotics.  . Tobacco abuse - tobacco cessation has been recommended. We'll write for nicotine patch  . Melena - questionable melena Protonix IV. GI consult in a.m. hold aspirin  . Sepsis - patient reported fever. In emergency department was noted to be tachycardic and hypotensive. Currently improved. We'll give IV fluids, blood cultures are pending, administer IV antibiotics  . Leg edema, left - trace, but given change from prior and sedentary life style will check doppler   Prophylaxis: SCD , Protonix  CODE STATUS:  FULL CODE   Other plan as per orders.  I have spent a total of 55 min on this admission  Gwenivere Hiraldo 03/07/2014, 12:32 AM  [facility]  Pager [phone]   after 2 AM please page floor coverage PA If 7AM-7PM, please contact the day team taking care of the patient  [email or web address]  Password [email or web address]

## 2014-03-07 NOTE — Progress Notes (Signed)
VASCULAR LAB PRELIMINARY  PRELIMINARY  PRELIMINARY  PRELIMINARY  Left lower extremity venous duplex completed.    Preliminary report:  Left:  No evidence of DVT, superficial thrombosis, or Baker's cyst.  [name], RVT 03/07/2014, 12:23 PM

## 2014-03-07 NOTE — Progress Notes (Signed)
[name] is a [facility] patient.  Please contact  GI for the consultation.

## 2014-03-07 NOTE — Progress Notes (Signed)
Nutrition Brief Note  Malnutrition Screening Tool result is inaccurate.  Please consult if nutrition needs are identified.  Irvine Glorioso F Rennee Coyne MS RD LDN Clinical Dietitian Pager:[phone]   

## 2014-03-07 NOTE — Consult Note (Signed)
Referring Provider: No ref. provider found Primary Care Physician:  Kaleen Mask, MD Primary Gastroenterologist:  Dr. Juanda Chance  Reason for Consultation:  Hematochezia  HPI: [name] is a 54 y.o. female has a past medical history of TIA and CVA (2009), GERD, colonic inertia, lung abscess, COPD (currently on O2 at home), PTSD, bipolar disorder, chronic headache, seizures, fibromyalgia, arthritis, acute ischemic colitis (11/01/2012), and marijuana abuse.  She presented to [facility] hospital with complaints of sudden onset RLQ abdominal pain and bloody stools.  She could not sleep well due to pain.  In the morning of 03/06/14 she woke up and started to had some red blood in stool. Patient developed low grade fever up to 101.3 (although no fevers here). She took some tylenol. Patient reported one episode of vomiting, no blood or coffee ground emesis. She had some dark stool there after. In emergency department blood pressure noted down to 94/65, pulse 108, white blood cell count 15.3; altogether worrisome for sepsis.  She says that her BP usually runs on the low side, however.  In the past patient reports possible ischemic colitis due to withdrawal from Cymbalta in 10/2012. Per review of records in August 2014 she had a CT scan showing colitis repeat CT scan in October 2014 showed resolution of colitis with no evidence of ischemic colitis and no significant mesenteric atherosclerosis to suggest the patient is predisposed to ischemic colitis. Patient denies history of chronic abdominal pain but she have had frequent visits with reports of abdominal pain. She had past admission secondary to hypoxic hypercarbic respiratory failure requiring stepdown unit stay and BiPAP. Patient continues to smoke.  Is on O2 at home for exertional dyspnea.  CT scan of the abdomen and pelvis with contrast on this admission showed the following:  "IMPRESSION: Prominent wall thickening and edema of the cecum probably  indicating focal colitis but cecal mass not excluded. Stool-filled colon to the level of the mid transverse colon with decompression of the distal colon. Mass or stricture this level is not excluded and colonoscopy is suggested for further evaluation. Mild wall thickening in the bladder may indicate cystitis. Focal fatty infiltration in the liver."  She has been placed on Zosyn empirically.  Last full colonoscopy was with Dr. Juanda Chance in 10/2009 at which time she was found to have only a redundant slightly dilated colon with suspicion for colonic inertia.  She had a flex sig by Dr. Leone Payor in 10/2012, which showed non-bleeding mucosal ulcerations in the descending colon and splenic flexure that looked like ischemic colitis; biopsies confirmed ischemic type mucosal injury.  Has not passed anymore blood since admission.  Still has some RLQ abdominal pain but would like something to drink.   Past Medical History  Diagnosis Date  . TIA (transient ischemic attack)   . GERD (gastroesophageal reflux disease)   . Hepatomegaly     hx  . Colonic inertia   . Lung abscess     "calcified over"; bilateral   . COPD (chronic obstructive pulmonary disease)   . Stroke 2009    TIA  . PTSD (post-traumatic stress disorder)   . Bipolar disorder   . Depression   . Chronic headache   . Seizures     in past  . Fibromyalgia   . Iron deficiency anemia   . Arthritis   . Acute ischemic colitis 11/01/2012  . Marijuana abuse 2014    positive screen in hospital  . TIA (transient ischemic attack)     Past  Surgical History  Procedure Laterality Date  . 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 Cesarean section      x 2  . Esophagogastroduodenoscopy  12/23/2011    Procedure: ESOPHAGOGASTRODUODENOSCOPY (EGD);  Surgeon: Hart Carwin, MD;  Location: [facility];  Service: Endoscopy;  Laterality: N/A;  . Colonoscopy    . Colonoscopy N/A 11/01/2012    Procedure: COLONOSCOPY;   Surgeon: Iva Boop, MD;  Location: [facility];  Service: Endoscopy;  Laterality: N/A;    Prior to Admission medications   Medication Sig Start Date End Date Taking? Authorizing Provider  acetaminophen (TYLENOL) 500 MG tablet Take 1,000 mg by mouth every 6 (six) hours as needed for moderate pain.   Yes Historical Provider, MD  albuterol (PROVENTIL) (2.5 MG/3ML) 0.083% nebulizer solution Take 3 mLs (2.5 mg total) by nebulization every 6 (six) hours as needed for shortness of breath. For shortness of breath 12/20/13  Yes Alison Murray, MD  albuterol (VENTOLIN HFA) 108 (90 BASE) MCG/ACT inhaler Inhale 2 puffs into the lungs every 6 (six) hours as needed for wheezing or shortness of breath.    Yes Historical Provider, MD  antiseptic oral rinse (CPC / CETYLPYRIDINIUM CHLORIDE 0.05%) 0.05 % LIQD solution 7 mLs by Mouth Rinse route 2 times daily at 12 noon and 4 pm. 12/20/13  Yes Alison Murray, MD  aspirin EC 81 MG tablet Take 1 tablet (81 mg total) by mouth daily. 10/14/12  Yes Costin Otelia Sergeant, MD  chlorhexidine (PERIDEX) 0.12 % solution 15 mLs by Mouth Rinse route 2 (two) times daily. 12/20/13  Yes Alison Murray, MD  Cholecalciferol (VITAMIN D-3) 5000 UNITS TABS Take 5,000 Units by mouth daily.   Yes Historical Provider, MD  cyclobenzaprine (FLEXERIL) 5 MG tablet Take 1 tablet (5 mg total) by mouth 3 (three) times daily as needed for muscle spasms. 12/20/13  Yes Alison Murray, MD  dextromethorphan-guaiFENesin [facility] DM) 30-600 MG per 12 hr tablet Take 1 tablet by mouth 2 (two) times daily as needed for cough. 12/20/13  Yes Alison Murray, MD  diazepam (VALIUM) 10 MG tablet Take 1 tablet (10 mg total) by mouth every 12 (twelve) hours as needed for anxiety. 12/20/13  Yes Alison Murray, MD  dicyclomine (BENTYL) 20 MG tablet Take 1 tablet (20 mg total) by mouth 3 (three) times daily before meals. 11/02/12  Yes Dorothea Ogle, MD  docusate sodium 100 MG CAPS Take 100 mg by mouth daily as needed for mild  constipation. 12/20/13  Yes Alison Murray, MD  fluticasone [facility]) 50 MCG/ACT nasal spray Place 2 sprays into both nostrils daily as needed for allergies or rhinitis.   Yes Historical Provider, MD  lactose free nutrition (BOOST PLUS) LIQD Take 237 mLs by mouth 2 (two) times daily between meals. 04/03/13  Yes Nishant Dhungel, MD  lidocaine (XYLOCAINE) 5 % ointment Apply 1 application topically 2 (two) times daily as needed for mild pain.   Yes Historical Provider, MD  loratadine (CLARITIN) 10 MG tablet Take 10 mg by mouth every evening.    Yes Historical Provider, MD  mirtazapine (REMERON) 15 MG tablet Take 1 tablet (15 mg total) by mouth at bedtime. 12/20/13  Yes Alison Murray, MD  mometasone (NASONEX) 50 MCG/ACT nasal spray Place 2 sprays into the nose daily. 01/04/14  Yes Loren Racer, MD  mometasone-formoterol (DULERA) 100-5 MCG/ACT AERO Inhale 2 puffs into the  lungs 2 (two) times daily.   Yes Historical Provider, MD  oxyCODONE (ROXICODONE) 15 MG immediate release tablet Take 1 tablet (15 mg total) by mouth every 6 (six) hours as needed for pain (back pain). 12/20/13  Yes Alison Murray, MD  oxymetazoline (AFRIN NASAL SPRAY) 0.05 % nasal spray Place 1 spray into both nostrils 2 (two) times daily. 01/04/14  Yes Loren Racer, MD  pantoprazole (PROTONIX) 40 MG tablet Take 1 tablet (40 mg total) by mouth daily at 6 (six) AM. 11/02/12  Yes Dorothea Ogle, MD  promethazine (PHENERGAN) 25 MG tablet Take 0.5-1 tablets (12.5-25 mg total) by mouth every 6 (six) hours as needed for nausea. 12/20/13  Yes Alison Murray, MD  tiotropium (SPIRIVA) 18 MCG inhalation capsule Place 18 mcg into inhaler and inhale daily.   Yes Historical Provider, MD  VITAMIN E PO Take 1 capsule by mouth daily.   Yes Historical Provider, MD  DULERA 100-5 MCG/ACT AERO inhale 2 puffs by mouth twice a day Patient not taking: Reported on 03/06/2014    Barbaraann Share, MD  predniSONE (DELTASONE) 10 MG tablet Take 3 tablets (30 mg total) by  mouth daily with breakfast. Patient not taking: Reported on 03/06/2014 12/21/13   Alison Murray, MD  predniSONE (DELTASONE) 20 MG tablet 3 tabs po day one, then 2 po daily x 4 days Patient not taking: Reported on 03/06/2014 01/04/14   Loren Racer, MD  [facility] HANDIHALER 18 MCG inhalation capsule inhale contents of 1 capsule by mouth once daily Patient not taking: Reported on 03/06/2014 12/01/12   Barbaraann Share, MD    Current Facility-Administered Medications  Medication Dose Route Frequency Provider Last Rate Last Dose  . 0.9 %  sodium chloride infusion   Intravenous Continuous Therisa Doyne, MD 125 mL/hr at 03/07/14 0251    . acetaminophen (TYLENOL) tablet 650 mg  650 mg Oral Q6H PRN Therisa Doyne, MD       Or  . acetaminophen (TYLENOL) suppository 650 mg  650 mg Rectal Q6H PRN Therisa Doyne, MD      . albuterol (PROVENTIL) (2.5 MG/3ML) 0.083% nebulizer solution 2.5 mg  2.5 mg Nebulization Q4H PRN Therisa Doyne, MD      . antiseptic oral rinse (CPC / CETYLPYRIDINIUM CHLORIDE 0.05%) solution 7 mL  7 mL Mouth Rinse BID Therisa Doyne, MD      . dextromethorphan-guaiFENesin (MUCINEX DM) 30-600 MG per 12 hr tablet 1 tablet  1 tablet Oral BID PRN Therisa Doyne, MD      . guaiFENesin (MUCINEX) 12 hr tablet 600 mg  600 mg Oral BID Therisa Doyne, MD   600 mg at 03/07/14 0304  . HYDROmorphone (DILAUDID) injection 1 mg  1 mg Intravenous Q2H PRN Dorothea Ogle, MD      . ipratropium-albuterol (DUONEB) 0.5-2.5 (3) MG/3ML nebulizer solution 3 mL  3 mL Nebulization TID Therisa Doyne, MD   3 mL at 03/07/14 0833  . LORazepam (ATIVAN) injection 0.5 mg  0.5 mg Intravenous BID PRN Dorothea Ogle, MD      . mirtazapine (REMERON) tablet 15 mg  15 mg Oral QHS Therisa Doyne, MD      . mometasone-formoterol (DULERA) 100-5 MCG/ACT inhaler 2 puff  2 puff Inhalation BID Therisa Doyne, MD   2 puff at 03/07/14 1610  . nicotine (NICODERM CQ - dosed in mg/24 hr) patch 7 mg  7 mg  Transdermal Daily Therisa Doyne, MD      . oxyCODONE (Oxy IR/ROXICODONE)  immediate release tablet 15 mg  15 mg Oral Q6H PRN Therisa Doyne, MD   15 mg at 03/07/14 0446  . pantoprazole (PROTONIX) injection 40 mg  40 mg Intravenous Q12H Therisa Doyne, MD   40 mg at 03/07/14 0302  . piperacillin-tazobactam (ZOSYN) IVPB 3.375 g  3.375 g Intravenous Q8H Dorothea Ogle, MD      . promethazine (PHENERGAN) tablet 12.5 mg  12.5 mg Oral Q6H PRN Leda Gauze, NP       Or  . promethazine (PHENERGAN) injection 12.5 mg  12.5 mg Intravenous Q6H PRN Leda Gauze, NP   12.5 mg at 03/07/14 0538   Or  . promethazine (PHENERGAN) suppository 25 mg  25 mg Rectal Q6H PRN Beather Arbour Kirby-Graham, NP      . sodium chloride 0.9 % bolus 1,000 mL  1,000 mL Intravenous Once Therisa Doyne, MD   1,000 mL at 03/07/14 0253  . sodium chloride 0.9 % bolus 500 mL  500 mL Intravenous Once Therisa Doyne, MD   500 mL at 03/07/14 0254  . sodium chloride 0.9 % injection 3 mL  3 mL Intravenous Q12H Therisa Doyne, MD   3 mL at 03/07/14 0253    Allergies as of 03/06/2014 - Review Complete 03/06/2014  Allergen Reaction Noted  . Oxycodone-acetaminophen Anaphylaxis, Itching, and Nausea And Vomiting   . Codeine Nausea And Vomiting   . Hydrocodone Nausea And Vomiting 07/02/2010  . Quinolones Hives 03/22/2013    Family History  Problem Relation Age of Onset  . Emphysema Father   . Asthma Son   . Clotting disorder Mother   . Rheum arthritis Sister   . Rheum arthritis Brother   . Ovarian cancer Sister     History   Social History  . Marital Status: Married    Spouse Name: N/A    Number of Children: 4  . Years of Education: N/A   Occupational History  . 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   Social History Main Topics  . Smoking status: Current Every Day Smoker -- 1.00 packs/day for 39 years    Types: Cigarettes  . Smokeless tobacco: Never Used     Comment: 3-4  daily11/11/14  . Alcohol Use: No  . Drug Use: No     Comment: hx cannibus abuse  . Sexual Activity: No   Other Topics Concern  . Not on file   Social History Narrative   Pt is divorced.   Pt has 4 sons: 2 alive and 2 deceased.   Hx of cannabis abuse    Review of Systems: Ten point ROS is O/W negative except as mentioned in HPI.  Physical Exam: Vital signs in last 24 hours: Temp:  [98.1 F (36.7 C)-98.7 F (37.1 C)] 98.7 F (37.1 C) (01/06 0400) Pulse Rate:  [71-108] 79 (01/06 0800) Resp:  [14-25] 18 (01/06 0800) BP: (84-118)/(41-79) 94/52 mmHg (01/06 0800) SpO2:  [89 %-100 %] 100 % (01/06 0800) Weight:  [125 lb (56.7 kg)-131 lb 13.4 oz (59.8 kg)] 131 lb 13.4 oz (59.8 kg) (01/06 0221) Last BM Date: 03/06/14 General:  Alert,  Well-developed, well-nourished, pleasant and cooperative in NAD Head:  Normocephalic and atraumatic. Eyes:  Sclera clear, no icterus.  Conjunctiva pink. Ears:  Normal auditory acuity. Mouth:  No deformity or lesions.  Lungs:  Mild wheezing noted. Heart:  Regular rate and rhythm; no murmurs, clicks, rubs, or gallops. Abdomen:  Soft, non-distended.  BS present.  RLQ  TTP without R/R/G.   Rectal:  Deferred.  Dark heme positive stool in the ED.  Msk:  Symmetrical without gross deformities. Pulses:  Normal pulses noted. Extremities:  Without clubbing or edema. Neurologic:  Alert and  oriented x4;  grossly normal neurologically. Skin:  Intact without significant lesions or rashes. Psych:  Alert and cooperative. Normal mood and affect.  Intake/Output from previous day: 01/05 0701 - 01/06 0700 In: 568.8 [I.V.:518.8; IV Piggyback:50] Out: 1000 [Urine:1000] Intake/Output this shift: Total I/O In: 125 [I.V.:125] Out: -   Lab Results:  Recent Labs  03/06/14 2146 03/07/14 0315 03/07/14 0825  WBC 15.3* 11.9* 10.0  HGB 16.4* 13.4 13.0  HCT 48.5* 42.0 41.1  PLT 226 216 187   BMET  Recent Labs  03/06/14 2146 03/07/14 0311  NA 133* 138  K 3.9  3.9  CL 97 102  CO2 29 30  GLUCOSE 138* 100*  BUN 8 7  CREATININE 0.68 0.56  CALCIUM 9.3 8.6   LFT  Recent Labs  03/07/14 0311  PROT 6.2  ALBUMIN 3.5  AST 17  ALT 11  ALKPHOS 77  BILITOT 0.8   PT/INR  Recent Labs  03/07/14 0315  LABPROT 13.8  INR 1.05   Studies/Results: Ct Abdomen Pelvis W Contrast  03/07/2014   CLINICAL DATA:  Diffuse abdominal pain, fever, nausea and vomiting, blood in stool. White cell count 15.3. History of GERD, acute ischemic colitis, colonic inertia, and enlarged liver.  EXAM: CT ABDOMEN AND PELVIS WITH CONTRAST  TECHNIQUE: Multidetector CT imaging of the abdomen and pelvis was performed using the standard protocol following bolus administration of intravenous contrast.  CONTRAST:  OMNIPAQUE IOHEXOL 300 MG/ML SOLN, 50mL OMNIPAQUE IOHEXOL 300 MG/ML SOLN  COMPARISON:  06/21/2013  FINDINGS: Lung bases are clear.  Calcified granulomas in the spleen. Focal low-attenuation change in the liver adjacent to the falciform ligament consistent with focal fatty infiltration. No other focal liver lesions identified. The gallbladder, pancreas, adrenal glands, kidneys, inferior vena cava, and retroperitoneal lymph nodes are unremarkable. Calcification of the abdominal aorta without aneurysm. Stomach and small bowel are normal although under distention limits evaluation. Interval development of diffuse wall thickening and edema in the cecum. This may represent focal colitis although developing cecal mass is not excluded. Ascending and proximal transverse colon are stool filled with transition zone to decompressed colon in the distal transverse colon and descending region. Obstructing lesion at the mid transverse colon is not excluded. Consider colonoscopy for further evaluation. No free air or free fluid in the abdomen.  Pelvis: Mild diffuse bladder wall thickening may indicate cystitis. Uterus and ovaries are not enlarged. No free or loculated pelvic fluid collections.  Rectosigmoid colon is decompressed. No pelvic mass or lymphadenopathy. No destructive bone lesions. Normal alignment of the lumbar spine.  IMPRESSION: Prominent wall thickening and edema of the cecum probably indicating focal colitis but cecal mass not excluded. Stool-filled colon to the level of the mid transverse colon with decompression of the distal colon. Mass or stricture this level is not excluded and colonoscopy is suggested for further evaluation. Mild wall thickening in the bladder may indicate cystitis. Focal fatty infiltration in the liver.   Electronically Signed   By: Burman Nieves M.D.   On: 03/07/2014 01:58   Dg Chest Port 1 View  03/06/2014   CLINICAL DATA:  Fever, shortness of breath and left-sided chest pain.  EXAM: PORTABLE CHEST - 1 VIEW  COMPARISON:  01/04/2014  FINDINGS: The cardiac silhouette, mediastinal and hilar  contours are within normal limits and stable. There are severe chronic lung changes with emphysema and pulmonary scarring. Stable calcified granuloma in the left upper lobe. No definite acute pulmonary findings. No pleural effusion. The bony thorax is intact.  IMPRESSION: Chronic emphysematous changes and pulmonary scarring but no definite acute overlying pulmonary process.   Electronically Signed   By: Loralie Champagne M.D.   On: 03/06/2014 22:16    IMPRESSION:  -Sudden onset of abdominal pain, subjective fever, N/V, leukocytosis, and bloody stools in a patient with history of ischemic colitis.  CT scan on this admission indicates that this may be issue at this time as well.  PLAN: -Observation and supportive care with IVF's, pain control, bowel rest.  Will give clear liquids for now.  Is on Zosyn but likely does not need antibiotics. -Monitor labs/Hgb.  Salle Brandle D.  03/07/2014, 8:58 AM  Pager number [phone]

## 2014-03-08 ENCOUNTER — Encounter (HOSPITAL_COMMUNITY): Payer: Self-pay | Admitting: *Deleted

## 2014-03-08 ENCOUNTER — Inpatient Hospital Stay (HOSPITAL_COMMUNITY)
Admission: EM | Admit: 2014-03-08 | Discharge: 2014-03-12 | DRG: 394 | Disposition: A | Discharge status: Home / Self Care | Payer: Medicaid Other | Attending: Internal Medicine | Admitting: Internal Medicine

## 2014-03-08 DIAGNOSIS — K55039 Acute (reversible) ischemia of large intestine, extent unspecified: Secondary | ICD-10-CM | POA: Diagnosis present

## 2014-03-08 DIAGNOSIS — Z7982 Long term (current) use of aspirin: Secondary | ICD-10-CM

## 2014-03-08 DIAGNOSIS — R160 Hepatomegaly, not elsewhere classified: Secondary | ICD-10-CM | POA: Diagnosis present

## 2014-03-08 DIAGNOSIS — J449 Chronic obstructive pulmonary disease, unspecified: Secondary | ICD-10-CM | POA: Diagnosis present

## 2014-03-08 DIAGNOSIS — R1084 Generalized abdominal pain: Secondary | ICD-10-CM | POA: Diagnosis present

## 2014-03-08 DIAGNOSIS — M1990 Unspecified osteoarthritis, unspecified site: Secondary | ICD-10-CM | POA: Diagnosis present

## 2014-03-08 DIAGNOSIS — F4310 Post-traumatic stress disorder, unspecified: Secondary | ICD-10-CM | POA: Diagnosis present

## 2014-03-08 DIAGNOSIS — A419 Sepsis, unspecified organism: Secondary | ICD-10-CM | POA: Diagnosis present

## 2014-03-08 DIAGNOSIS — D509 Iron deficiency anemia, unspecified: Secondary | ICD-10-CM | POA: Diagnosis present

## 2014-03-08 DIAGNOSIS — J9611 Chronic respiratory failure with hypoxia: Secondary | ICD-10-CM | POA: Diagnosis present

## 2014-03-08 DIAGNOSIS — K219 Gastro-esophageal reflux disease without esophagitis: Secondary | ICD-10-CM | POA: Diagnosis present

## 2014-03-08 DIAGNOSIS — R569 Unspecified convulsions: Secondary | ICD-10-CM | POA: Diagnosis present

## 2014-03-08 DIAGNOSIS — R109 Unspecified abdominal pain: Secondary | ICD-10-CM

## 2014-03-08 DIAGNOSIS — K922 Gastrointestinal hemorrhage, unspecified: Secondary | ICD-10-CM | POA: Diagnosis present

## 2014-03-08 DIAGNOSIS — F319 Bipolar disorder, unspecified: Secondary | ICD-10-CM | POA: Diagnosis present

## 2014-03-08 DIAGNOSIS — Z885 Allergy status to narcotic agent status: Secondary | ICD-10-CM

## 2014-03-08 DIAGNOSIS — F17210 Nicotine dependence, cigarettes, uncomplicated: Secondary | ICD-10-CM | POA: Diagnosis present

## 2014-03-08 DIAGNOSIS — R112 Nausea with vomiting, unspecified: Secondary | ICD-10-CM | POA: Diagnosis present

## 2014-03-08 DIAGNOSIS — K550 Acute vascular disorders of intestine: Principal | ICD-10-CM | POA: Diagnosis present

## 2014-03-08 DIAGNOSIS — Z8673 Personal history of transient ischemic attack (TIA), and cerebral infarction without residual deficits: Secondary | ICD-10-CM

## 2014-03-08 DIAGNOSIS — K559 Vascular disorder of intestine, unspecified: Secondary | ICD-10-CM | POA: Diagnosis present

## 2014-03-08 DIAGNOSIS — R1031 Right lower quadrant pain: Secondary | ICD-10-CM | POA: Insufficient documentation

## 2014-03-08 LAB — CBC
HCT: 40.2 % (ref 36.0–46.0)
Hemoglobin: 12.6 g/dL (ref 12.0–15.0)
MCH: 29.1 pg (ref 26.0–34.0)
MCHC: 31.3 g/dL (ref 30.0–36.0)
MCV: 92.8 fL (ref 78.0–100.0)
PLATELETS: 199 10*3/uL (ref 150–400)
RBC: 4.33 MIL/uL (ref 3.87–5.11)
RDW: 14.3 % (ref 11.5–15.5)
WBC: 8.9 10*3/uL (ref 4.0–10.5)

## 2014-03-08 LAB — URINE CULTURE
COLONY COUNT: NO GROWTH
Culture: NO GROWTH

## 2014-03-08 LAB — BASIC METABOLIC PANEL
Anion gap: 7 (ref 5–15)
CALCIUM: 8.8 mg/dL (ref 8.4–10.5)
CO2: 31 mmol/L (ref 19–32)
Chloride: 101 mEq/L (ref 96–112)
Creatinine, Ser: 0.56 mg/dL (ref 0.50–1.10)
GFR calc Af Amer: >90 mL/min (ref >90)
GLUCOSE: 98 mg/dL (ref 70–99)
Potassium: 3.9 mmol/L (ref 3.5–5.1)
Sodium: 139 mmol/L (ref 135–145)

## 2014-03-08 MED ORDER — LORATADINE 10 MG PO TABS
10.0000 mg | ORAL_TABLET | Freq: Every evening | ORAL | Status: DC
  Filled 2014-03-08: qty 1

## 2014-03-08 MED ORDER — HYDROMORPHONE HCL 1 MG/ML IJ SOLN
1.0000 mg | Freq: Once | INTRAMUSCULAR | Status: AC
  Administered 2014-03-08: 1 mg via INTRAVENOUS
  Filled 2014-03-08: qty 1

## 2014-03-08 MED ORDER — OXYMETAZOLINE HCL 0.05 % NA SOLN
1.0000 | Freq: Two times a day (BID) | NASAL | Status: DC
  Filled 2014-03-08: qty 15

## 2014-03-08 MED ORDER — DICYCLOMINE HCL 20 MG PO TABS
20.0000 mg | ORAL_TABLET | Freq: Three times a day (TID) | ORAL | Status: DC
  Filled 2014-03-08 (×2): qty 1

## 2014-03-08 MED ORDER — KETOROLAC TROMETHAMINE 30 MG/ML IJ SOLN
30.0000 mg | Freq: Once | INTRAMUSCULAR | Status: AC
  Administered 2014-03-08: 30 mg via INTRAVENOUS
  Filled 2014-03-08: qty 1

## 2014-03-08 MED ORDER — FENTANYL CITRATE 0.05 MG/ML IJ SOLN
25.0000 ug | INTRAMUSCULAR | Status: DC | PRN
  Administered 2014-03-08: 25 ug via INTRAVENOUS
  Filled 2014-03-08: qty 2

## 2014-03-08 MED ORDER — SODIUM CHLORIDE 0.9 % IV BOLUS (SEPSIS)
1000.0000 mL | Freq: Once | INTRAVENOUS | Status: AC
  Administered 2014-03-08: 1000 mL via INTRAVENOUS

## 2014-03-08 MED ORDER — TIOTROPIUM BROMIDE MONOHYDRATE 18 MCG IN CAPS
18.0000 ug | ORAL_CAPSULE | Freq: Every day | RESPIRATORY_TRACT | Status: DC
  Filled 2014-03-08: qty 5

## 2014-03-08 MED ORDER — ONDANSETRON HCL 4 MG/2ML IJ SOLN
4.0000 mg | Freq: Once | INTRAMUSCULAR | Status: AC
  Administered 2014-03-08: 4 mg via INTRAVENOUS
  Filled 2014-03-08: qty 2

## 2014-03-08 MED ORDER — FLUTICASONE PROPIONATE 50 MCG/ACT NA SUSP: 2.0000 | Freq: Every day | NASAL | Status: DC | PRN

## 2014-03-08 MED ORDER — IPRATROPIUM-ALBUTEROL 0.5-2.5 (3) MG/3ML IN SOLN
3.0000 mL | Freq: Once | RESPIRATORY_TRACT | Status: AC
  Administered 2014-03-08: 3 mL via RESPIRATORY_TRACT
  Filled 2014-03-08: qty 3

## 2014-03-08 MED ORDER — MOMETASONE FURO-FORMOTEROL FUM 200-5 MCG/ACT IN AERO
2.0000 | INHALATION_SPRAY | Freq: Two times a day (BID) | RESPIRATORY_TRACT | Status: DC
  Administered 2014-03-08: 2 via RESPIRATORY_TRACT
  Filled 2014-03-08 (×2): qty 8.8

## 2014-03-08 NOTE — ED Provider Notes (Signed)
CSN: [id number]     Arrival date & time 03/08/14  2015 History   First MD Initiated Contact with Patient 03/08/14 2151     Chief Complaint  Patient presents with  . Abdominal Pain     (Consider location/radiation/quality/duration/timing/severity/associated sxs/prior Treatment) HPI Comments: Patient is a 54 year old female with a past medical history of COPD, fibromyalgia, acute ischemic colitis, TIA, and bipolar disorder who presents with worsening abdominal pain for the past 3 days. Patient report the pain is worse in the RLQ and she felt this way the last time she has ischemic colitis. The pain is aching and severe and does not radiate. She reports associated fever, melena, and coffee-ground emesis 2 days ago when she was admitted to [facility]. She was receiving IV zosyn and placed on a liquid diet. Patient reports the GI doctor told her to get up and walk around to help with her symptoms. Patient walked to the gift shop at [employer] earlier today. The nurses on the floor were unable to find her and discharged her "AMA." When they located the patient, they told her she had been discharged but invited her back to be admitted and continue treatment. Patient did not want to stay at that hospital any longer.    Past Medical History  Diagnosis Date  . TIA (transient ischemic attack)   . GERD (gastroesophageal reflux disease)   . Hepatomegaly     hx  . Colonic inertia   . Lung abscess     "calcified over"; bilateral   . COPD (chronic obstructive pulmonary disease)   . Stroke 2009    TIA  . PTSD (post-traumatic stress disorder)   . Bipolar disorder   . Depression   . Chronic headache   . Seizures     in past  . Fibromyalgia   . Iron deficiency anemia   . Arthritis   . Acute ischemic colitis 11/01/2012  . Marijuana abuse 2014    positive screen in hospital  . TIA (transient ischemic attack)    Past Surgical History  Procedure Laterality Date  . 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 Cesarean section      x 2  . Esophagogastroduodenoscopy  12/23/2011    Procedure: ESOPHAGOGASTRODUODENOSCOPY (EGD);  Surgeon: Hart Carwin, MD;  Location: [facility];  Service: Endoscopy;  Laterality: N/A;  . Colonoscopy    . Colonoscopy N/A 11/01/2012    Procedure: COLONOSCOPY;  Surgeon: Iva Boop, MD;  Location: [facility];  Service: Endoscopy;  Laterality: N/A;   Family History  Problem Relation Age of Onset  . Emphysema Father   . Asthma Son   . Clotting disorder Mother   . Rheum arthritis Sister   . Rheum arthritis Brother   . Ovarian cancer Sister    History  Substance Use Topics  . Smoking status: Current Every Day Smoker -- 1.00 packs/day for 39 years    Types: Cigarettes  . Smokeless tobacco: Never Used     Comment: 3-4 daily11/11/14  . Alcohol Use: No   OB History    No data available     Review of Systems  Constitutional: Positive for fever. Negative for chills and fatigue.  HENT: Negative for trouble swallowing.   Eyes: Negative for visual disturbance.  Respiratory: Negative for shortness of breath.   Cardiovascular: Negative for chest pain and palpitations.  Gastrointestinal: Positive for nausea, vomiting and  abdominal pain. Negative for diarrhea.  Genitourinary: Negative for dysuria and difficulty urinating.  Musculoskeletal: Negative for arthralgias and neck pain.  Skin: Negative for color change.  Neurological: Negative for dizziness and weakness.  Psychiatric/Behavioral: Negative for dysphoric mood.      Allergies  Oxycodone-acetaminophen; Codeine; Hydrocodone; and Quinolones  Home Medications   Prior to Admission medications   Medication Sig Start Date End Date Taking? Authorizing Provider  acetaminophen (TYLENOL) 500 MG tablet Take 1,000 mg by mouth every 6 (six) hours as needed for moderate pain.    Historical Provider, MD  albuterol (PROVENTIL) (2.5 MG/3ML) 0.083% nebulizer solution Take 3  mLs (2.5 mg total) by nebulization every 6 (six) hours as needed for shortness of breath. For shortness of breath 12/20/13   Alison Murray, MD  albuterol (VENTOLIN HFA) 108 (90 BASE) MCG/ACT inhaler Inhale 2 puffs into the lungs every 6 (six) hours as needed for wheezing or shortness of breath.     Historical Provider, MD  antiseptic oral rinse (CPC / CETYLPYRIDINIUM CHLORIDE 0.05%) 0.05 % LIQD solution 7 mLs by Mouth Rinse route 2 times daily at 12 noon and 4 pm. 12/20/13   Alison Murray, MD  aspirin EC 81 MG tablet Take 1 tablet (81 mg total) by mouth daily. 10/14/12   Costin Otelia Sergeant, MD  chlorhexidine (PERIDEX) 0.12 % solution 15 mLs by Mouth Rinse route 2 (two) times daily. 12/20/13   Alison Murray, MD  Cholecalciferol (VITAMIN D-3) 5000 UNITS TABS Take 5,000 Units by mouth daily.    Historical Provider, MD  cyclobenzaprine (FLEXERIL) 5 MG tablet Take 1 tablet (5 mg total) by mouth 3 (three) times daily as needed for muscle spasms. 12/20/13   Alison Murray, MD  dextromethorphan-guaiFENesin [facility] DM) 30-600 MG per 12 hr tablet Take 1 tablet by mouth 2 (two) times daily as needed for cough. 12/20/13   Alison Murray, MD  diazepam (VALIUM) 10 MG tablet Take 1 tablet (10 mg total) by mouth every 12 (twelve) hours as needed for anxiety. 12/20/13   Alison Murray, MD  dicyclomine (BENTYL) 20 MG tablet Take 1 tablet (20 mg total) by mouth 3 (three) times daily before meals. 11/02/12   Dorothea Ogle, MD  docusate sodium 100 MG CAPS Take 100 mg by mouth daily as needed for mild constipation. 12/20/13   Alison Murray, MD  DULERA 100-5 MCG/ACT AERO inhale 2 puffs by mouth twice a day Patient not taking: Reported on 03/06/2014    Barbaraann Share, MD  fluticasone [facility]) 50 MCG/ACT nasal spray Place 2 sprays into both nostrils daily as needed for allergies or rhinitis.    Historical Provider, MD  lactose free nutrition (BOOST PLUS) LIQD Take 237 mLs by mouth 2 (two) times daily between meals. 04/03/13   Nishant  Dhungel, MD  lidocaine (XYLOCAINE) 5 % ointment Apply 1 application topically 2 (two) times daily as needed for mild pain.    Historical Provider, MD  loratadine (CLARITIN) 10 MG tablet Take 10 mg by mouth every evening.     Historical Provider, MD  mirtazapine (REMERON) 15 MG tablet Take 1 tablet (15 mg total) by mouth at bedtime. 12/20/13   Alison Murray, MD  mometasone (NASONEX) 50 MCG/ACT nasal spray Place 2 sprays into the nose daily. 01/04/14   Loren Racer, MD  mometasone-formoterol (DULERA) 100-5 MCG/ACT AERO Inhale 2 puffs into the lungs 2 (two) times daily.    Historical Provider, MD  oxyCODONE (ROXICODONE) 15  MG immediate release tablet Take 1 tablet (15 mg total) by mouth every 6 (six) hours as needed for pain (back pain). 12/20/13   Alison Murray, MD  oxymetazoline (AFRIN NASAL SPRAY) 0.05 % nasal spray Place 1 spray into both nostrils 2 (two) times daily. 01/04/14   Loren Racer, MD  pantoprazole (PROTONIX) 40 MG tablet Take 1 tablet (40 mg total) by mouth daily at 6 (six) AM. 11/02/12   Dorothea Ogle, MD  predniSONE (DELTASONE) 10 MG tablet Take 3 tablets (30 mg total) by mouth daily with breakfast. Patient not taking: Reported on 03/06/2014 12/21/13   Alison Murray, MD  predniSONE (DELTASONE) 20 MG tablet 3 tabs po day one, then 2 po daily x 4 days Patient not taking: Reported on 03/06/2014 01/04/14   Loren Racer, MD  promethazine (PHENERGAN) 25 MG tablet Take 0.5-1 tablets (12.5-25 mg total) by mouth every 6 (six) hours as needed for nausea. 12/20/13   Alison Murray, MD  SPIRIVA HANDIHALER 18 MCG inhalation capsule inhale contents of 1 capsule by mouth once daily 12/01/12   Barbaraann Share, MD  VITAMIN E PO Take 1 capsule by mouth daily.    Historical Provider, MD   BP 137/71 mmHg  Pulse 93  Temp(Src) 98.1 F (36.7 C) (Oral)  Resp 64  SpO2 99%  LMP 05/29/2010 Physical Exam  Constitutional: She is oriented to person, place, and time. She appears well-developed and  well-nourished. No distress.  HENT:  Head: Normocephalic and atraumatic.  Eyes: Conjunctivae and EOM are normal. Pupils are equal, round, and reactive to light.  Neck: Normal range of motion.  Cardiovascular: Normal rate and regular rhythm.  Exam reveals no gallop and no friction rub.   No murmur heard. Pulmonary/Chest: Effort normal and breath sounds normal. She has no wheezes. She has no rales. She exhibits no tenderness.  Patient on 2L oxygen nasal cannula.   Abdominal: Soft. She exhibits no distension. There is tenderness. There is no rebound and no guarding.  Epigastric, RLQ, and LLQ tenderness to palpation. No peritoneal signs.   Musculoskeletal: Normal range of motion.  Neurological: She is alert and oriented to person, place, and time. Coordination normal.  Speech is goal-oriented. Moves limbs without ataxia.   Skin: Skin is warm and dry.  Psychiatric: She has a normal mood and affect. Her behavior is normal.  Nursing note and vitals reviewed.   ED Course  Procedures (including critical care time) Labs Review Labs Reviewed  CBC WITH DIFFERENTIAL - Abnormal; Notable for the following:    WBC 10.9 (*)    Neutro Abs 7.9 (*)    All other components within normal limits  COMPREHENSIVE METABOLIC PANEL - Abnormal; Notable for the following:    Sodium 134 (*)    BUN <5 (*)    All other components within normal limits  LIPASE, BLOOD  URINALYSIS, ROUTINE W REFLEX MICROSCOPIC    Imaging Review Ct Abdomen Pelvis W Contrast  03/07/2014   CLINICAL DATA:  Diffuse abdominal pain, fever, nausea and vomiting, blood in stool. White cell count 15.3. History of GERD, acute ischemic colitis, colonic inertia, and enlarged liver.  EXAM: CT ABDOMEN AND PELVIS WITH CONTRAST  TECHNIQUE: Multidetector CT imaging of the abdomen and pelvis was performed using the standard protocol following bolus administration of intravenous contrast.  CONTRAST:  OMNIPAQUE IOHEXOL 300 MG/ML SOLN, 50mL OMNIPAQUE  IOHEXOL 300 MG/ML SOLN  COMPARISON:  06/21/2013  FINDINGS: Lung bases are clear.  Calcified granulomas in  the spleen. Focal low-attenuation change in the liver adjacent to the falciform ligament consistent with focal fatty infiltration. No other focal liver lesions identified. The gallbladder, pancreas, adrenal glands, kidneys, inferior vena cava, and retroperitoneal lymph nodes are unremarkable. Calcification of the abdominal aorta without aneurysm. Stomach and small bowel are normal although under distention limits evaluation. Interval development of diffuse wall thickening and edema in the cecum. This may represent focal colitis although developing cecal mass is not excluded. Ascending and proximal transverse colon are stool filled with transition zone to decompressed colon in the distal transverse colon and descending region. Obstructing lesion at the mid transverse colon is not excluded. Consider colonoscopy for further evaluation. No free air or free fluid in the abdomen.  Pelvis: Mild diffuse bladder wall thickening may indicate cystitis. Uterus and ovaries are not enlarged. No free or loculated pelvic fluid collections. Rectosigmoid colon is decompressed. No pelvic mass or lymphadenopathy. No destructive bone lesions. Normal alignment of the lumbar spine.  IMPRESSION: Prominent wall thickening and edema of the cecum probably indicating focal colitis but cecal mass not excluded. Stool-filled colon to the level of the mid transverse colon with decompression of the distal colon. Mass or stricture this level is not excluded and colonoscopy is suggested for further evaluation. Mild wall thickening in the bladder may indicate cystitis. Focal fatty infiltration in the liver.   Electronically Signed   By: Burman Nieves M.D.   On: 03/07/2014 01:58     EKG Interpretation None      MDM   Final diagnoses:  Right lower quadrant abdominal pain    10:26 PM Labs pending. Patient given IV dilaudid and  zofran for symptoms. Vitals stable and patient afebrile. Patient on 2L oxygen nasal cannula per home regimen.   Labs show elevated WBC. Patient will be admitted to Dr. Julian Reil.     [address] [place], PA-C 03/09/14 0341  Raeford Razor, MD 03/10/14 [phone]

## 2014-03-08 NOTE — Progress Notes (Signed)
MD notified of staff inability to locate patient. Patient considered to have left AMA and will need to go through ED if she should return to unit.

## 2014-03-08 NOTE — Progress Notes (Signed)
    Progress Note   Subjective  still having RLQ pain but no further BMs / bleeding. Migraine last night.    Objective   Vital signs in last 24 hours: Temp:  [97.9 F (36.6 C)-99.8 F (37.7 C)] 98.5 F (36.9 C) (01/07 0515) Pulse Rate:  [78-113] 101 (01/07 0515) Resp:  [13-23] 18 (01/07 0515) BP: (90-126)/(40-59) 116/59 mmHg (01/07 0515) SpO2:  [90 %-100 %] 95 % (01/07 0515) Weight:  [139 lb 15.9 oz (63.5 kg)] 139 lb 15.9 oz (63.5 kg) (01/06 1617) Last BM Date: 03/06/14 General:    white female in NAD Heart:  Slightly tach, regular rhythm Abdomen:  Soft, nondistended, moderate RLQ tenderness, hypoactive bowel sounds.  Extremities:  Without edema. Neurologic:  Alert and oriented,  grossly normal neurologically. Psych:  Cooperative. Normal mood and affect.    Lab Results:  Recent Labs  03/07/14 0825 03/07/14 1600 03/08/14 0508  WBC 10.0 9.4 8.9  HGB 13.0 12.9 12.6  HCT 41.1 41.2 40.2  PLT 187 193 199   BMET  Recent Labs  03/06/14 2146 03/07/14 0311 03/08/14 0508  NA 133* 138 139  K 3.9 3.9 3.9  CL 97 102 101  CO2 29 30 31   GLUCOSE 138* 100* 98  BUN 8 7 <5*  CREATININE 0.68 0.56 0.56  CALCIUM 9.3 8.6 8.8   LFT  Recent Labs  03/07/14 0311  PROT 6.2  ALBUMIN 3.5  AST 17  ALT 11  ALKPHOS 77  BILITOT 0.8   PT/INR  Recent Labs  03/07/14 0315  LABPROT 13.8  INR 1.05      Assessment / Plan:   Abdominal pain / bloody stools. WBC normal now, still with low grade temp. Hgb stable at 12.6. CTscan suggests wall thickening / edema of cecum. Suspect this is focal colitis of infectious nature but ischemic event not excluded. Doubt neoplasm but may need outpatient colonoscopy after recovery as her last one was in 2011. Continue antibiotics. Given ongoing pain would keep on clears for now.   LOS: 2 days   Willette Cluster  03/08/2014, 8:49 AM

## 2014-03-08 NOTE — Progress Notes (Signed)
Unable to locate patient on unit. Security notified and unable to locate on hospital property. Patient discharged from unit.

## 2014-03-08 NOTE — ED Notes (Signed)
Pt was recently discharged form [facility]. Pt states she went to the gift shop to get something for her mom who is also in the hospital, upon pt's arrival back to the floor pt states she was discharged from the hospital claiming she left AMA. Pt states she was discharged because she did not "ask permission" to leave the floor. Pt still has IV in placed from [facility]. Pt states she was given her personal belongings and asked to leave the hospital. Pt states she vomited three times outside the gift shop. Melissa AD aware of pt's situation. Pt was admitted at [facility] for abdominal bleeding and ischemic colitis.

## 2014-03-08 NOTE — Progress Notes (Signed)
UR complete 

## 2014-03-08 NOTE — Evaluation (Signed)
Physical Therapy Evaluation Patient Details Name: [name] MRN: [id number] DOB: 02/24/1961 Today's Date: 03/08/2014   History of Present Illness  54 year old female adm 03/06/14  presented to [facility] ED with worsening abdominal pain;  past medical history of COPD, chronic resp failure-no longer on continuous O2, tobacco abuse, VDRF in January 2015 secondary to metapneumovirus, bipolar depression, hx/o TIA, GERD   Clinical Impression  Pt up moving around but wanted to try amb in hallway with PT; she reports she is at her baseline for mobility and it is only her "stomach that is giving her trouble"; Pt able to amb ~120' without AD, no LOB; no LOB noted while pt moving around obstacles, backward amb,etc while in room; PT will sign off; Recommend pt amb as tol with distant  staff supervision    Follow Up Recommendations No PT follow up    Equipment Recommendations  None recommended by PT    Recommendations for Other Services       Precautions / Restrictions        Mobility  Bed Mobility Overal bed mobility: Independent                Transfers Overall transfer level: Independent                  Ambulation/Gait Ambulation/Gait assistance: Independent Ambulation Distance (Feet): 120 Feet Assistive device: None Gait Pattern/deviations: WFL(Within Functional Limits)        Stairs            Wheelchair Mobility    Modified Rankin (Stroke Patients Only)       Balance Overall balance assessment: No apparent balance deficits (not formally assessed)                                           Pertinent Vitals/Pain Pain Assessment: 0-10 Pain Score: 3  Pain Descriptors / Indicators: Constant Pain Intervention(s): Limited activity within patient's tolerance;Patient requesting pain meds-RN notified;Monitored during session    Home Living Family/patient expects to be discharged to:: Private residence Living Arrangements: Spouse/significant  other Available Help at Discharge: Family;Available 24 hours/day Type of Home: Mobile home Home Access: Stairs to enter   Entrance Stairs-Number of Steps: 3-4 Home Layout: One level Home Equipment: Shower seat - built in;Grab bars - tub/shower;Walker - 4 wheels      Prior Function Level of Independence: Independent               Hand Dominance        Extremity/Trunk Assessment   Upper Extremity Assessment: Overall WFL for tasks assessed           Lower Extremity Assessment: Overall WFL for tasks assessed         Communication   Communication: No difficulties  Cognition Arousal/Alertness: Awake/alert Behavior During Therapy: WFL for tasks assessed/performed Overall Cognitive Status: Within Functional Limits for tasks assessed                      General Comments General comments (skin integrity, edema, etc.): pt up moving around I'ly in room, reports she is supposed to amb in hallway but no one has done this yet, reports she woul dlike to go ahead and "try with PT"; Pt aware that she is supposed to amb d/t her "stomach issues"    Exercises  Assessment/Plan    PT Assessment Patent does not need any further PT services  PT Diagnosis Acute pain   PT Problem List    PT Treatment Interventions     PT Goals (Current goals can be found in the Care Plan section) Acute Rehab PT Goals PT Goal Formulation: All assessment and education complete, DC therapy    Frequency     Barriers to discharge        Co-evaluation               End of Session   Activity Tolerance: Patient tolerated treatment well Patient left: in bed;with call bell/phone within reach Nurse Communication: Mobility status         Time: 2458-0998 PT Time Calculation (min) (ACUTE ONLY): 11 min   Charges:   PT Evaluation $Initial PT Evaluation Tier I: 1 Procedure     PT G CodesDrucilla Chalet 2014-03-31, 2:09 PM

## 2014-03-08 NOTE — Progress Notes (Addendum)
Patient ID: [name], female   DOB: 03-01-1961, 54 y.o.   MRN: [id number]  [facility] PROGRESS NOTE  [name] WUJ:[id number] DOB: September 22, 1960 DOA: 03/06/2014 PCP: Leonard Downing, MD  Brief narrative:  54 year old female with past medical history of COPD, chronic resp failure-no longer on continuous O2, tobacco abuse, VDRF in January 2015 secondary to metapneumovirus, bipolar depression, hx/o TIA, GERD who presented to [facility] ED with worsening abdominal pain in right upper quadrant and epigastric area which started the night prior to this admission and woke her up from sleep. Patient reported associated fevers at home, T max 101.3 F, nausea, non bloody vomiting. She did reports dark stool and some red blood in stool. Patient has had colonoscopy in 2011 by Dr. [name] and at that time it showed suspicion for colonic inertia. Flexible sigmoidoscopy was done by Dr. [name] in 10/2012 and showed non bleeding mucosal ulcerations in the descending colon and splenic flexure that looked like ischemic colitis; biopsies confirmed ischemic type mucosal injury.  On admission, BP was 84/41, HR 76 - 108, RR 12-25, T max 98.7 F and oxygen saturation 89-100% with [place] oxygen support. Blood work revealed WBC count of 15.3, hemoglobin 16.4, otehrwise unremarkable. CT abdomen showed prominent wall thickening and edema of the cecum probably indicating focal colitis but cecal mass not excluded; stool-filled colon to the level of the mid transverse colon with decompression of the distal colon; mass or stricture at this level is not excluded and colonoscopy is suggested for further evaluation; mild wall thickening in the bladder may indicate cystitis.   Assessment and Plan:    Principal Problem: Abdominal pain / possible colitis - patient admitted with abdominal pain and CT abdomen suggestive of colitis. As noted patient has had flexible sigmoidoscopy was done by Dr. [name] in 10/2012 and showed non bleeding  mucosal ulcerations in the descending colon and splenic flexure that looked like ischemic colitis; biopsies confirmed ischemic type mucosal injury. - patient started on zosyn, will continue  - continue supportive care with IV fluids, analgesia and antiemetics as needed - appreciate GI input   Active Problems: Sepsis - sepsis secondary to possible colitis and questionable cystitis as seen on CT abdomen - sepsis criteria met on admission with hypotension, tachycardia, tachypnea, hypoxia. In addition, WBC count elevated at 15.3.  - WBC now WNL and pt afebrile, continue ABX for now - UA did not show evidence of UTI. CXR did not reveal acute cardiopulmonary process   COPD (chronic obstructive pulmonary disease) / tobacco abuse  - respiratory status is stable at this time.  - continue scheduled Duoneb TID, Dulera inhaler BID - nicotine patch ordered - counseled on smoking cessation   Possible GI bleed - pt has had complaints of dark stool and some red blood per rectum - FOBT (+) - hemoglobin stable  Mild elevation in TSH  - TSH 5.225 - needs to be repeated outpt in about 4 weeks   LE edema, left - No evidence of DVT on Doppler studies  DVT prophylaxis  SCD's while pt is in hospital due to risk of bleeding.  Code Status: Full Family Communication: family not at the bedside this am Disposition Plan: Home when medically stable   IV Access:    Peripheral IV  Procedures and diagnostic studies:    Ct Abdomen Pelvis W Contrast 03/07/2014 Prominent wall thickening and edema of the cecum probably indicating focal colitis but cecal mass not excluded. Stool-filled colon to the level of  the mid transverse colon with decompression of the distal colon. Mass or stricture this level is not excluded and colonoscopy is suggested for further evaluation. Mild wall thickening in the bladder may indicate cystitis. Focal fatty infiltration in the liver.  Dg Chest Port 1 View 03/06/2014  Chronic emphysematous changes and pulmonary scarring but no definite acute overlying pulmonary process.  Medical Consultants:    GI  Other Consultants:    None  Anti-Infectives:    Zosyn 03/07/2014 -->    Faye Ramsay, MD  [facility] Pager [phone]  If 7PM-7AM, please contact night-coverage [email or web address] Password [facility] 03/08/2014, 9:48 AM   LOS: 2 days   HPI/Subjective: No events overnight.   Objective: Filed Vitals:   03/07/14 1617 03/07/14 1930 03/07/14 2245 03/08/14 0515  BP: 105/53  126/50 116/59  Pulse: 91  113 101  Temp: 97.9 F (36.6 C)  99.8 F (37.7 C) 98.5 F (36.9 C)  TempSrc: Oral   Oral  Resp: 18  18 18   Height: 5' 11"  (1.803 m)     Weight: 63.5 kg (139 lb 15.9 oz)     SpO2: 97% 90%  95%    Intake/Output Summary (Last 24 hours) at 03/08/14 0948 Last data filed at 03/08/14 0400  Gross per 24 hour  Intake   1015 ml  Output      0 ml  Net   1015 ml    Exam:   General:  Pt is alert, follows commands appropriately, not in acute distress  Cardiovascular: Regular rhythm, S1/S2, no murmurs, no rubs, no gallops  Respiratory: Clear to auscultation bilaterally, no wheezing, no crackles, no rhonchi  Abdomen: Soft, non tender, non distended, bowel sounds present, no guarding   Data Reviewed: Basic Metabolic Panel:  Recent Labs Lab 03/06/14 2146 03/07/14 0311 03/08/14 0508  NA 133* 138 139  K 3.9 3.9 3.9  CL 97 102 101  CO2 29 30 31   GLUCOSE 138* 100* 98  BUN 8 7 <5*  CREATININE 0.68 0.56 0.56  CALCIUM 9.3 8.6 8.8  MG  --  1.9  --   PHOS  --  4.2  --    Liver Function Tests:  Recent Labs Lab 03/06/14 2146 03/07/14 0311  AST 25 17  ALT 13 11  ALKPHOS 94 77  BILITOT 0.8 0.8  PROT 7.7 6.2  ALBUMIN 4.4 3.5    Recent Labs Lab 03/06/14 2146  LIPASE 19   CBC:  Recent Labs Lab 03/06/14 2146 03/07/14 0315 03/07/14 0825 03/07/14 1600 03/08/14 0508  WBC 15.3* 11.9* 10.0 9.4 8.9  NEUTROABS 12.2*  --   --   --    --   HGB 16.4* 13.4 13.0 12.9 12.6  HCT 48.5* 42.0 41.1 41.2 40.2  MCV 90.5 92.5 93.4 94.7 92.8  PLT 226 216 187 193 199   Cardiac Enzymes:  Recent Labs Lab 03/07/14 0315 03/07/14 0825 03/07/14 1435  TROPONINI <0.03 <0.03 <0.03    Recent Results (from the past 240 hour(s))  Blood culture (routine x 2)     Status: None (Preliminary result)   Collection Time: 03/06/14 10:33 PM  Result Value Ref Range Status   Specimen Description BLOOD LEFT ARM  Final   Culture   Final           BLOOD CULTURE RECEIVED NO GROWTH TO DATE    Report Status PENDING  Incomplete  Urine culture     Status: None   Collection Time: 03/07/14  1:01 AM  Result Value Ref  Range Status   Specimen Description URINE, CLEAN CATCH  Final   Special Requests NONE  Final   Colony Count NO GROWTH  Final   Report Status 03/08/2014 FINAL  Final  MRSA PCR Screening     Status: None   Collection Time: 03/07/14  3:07 AM  Result Value Ref Range Status   MRSA by PCR NEGATIVE NEGATIVE Final  Blood culture (routine x 2)     Status: None (Preliminary result)   Collection Time: 03/07/14  3:15 AM  Result Value Ref Range Status   Specimen Description BLOOD RIGHT ANTECUBITAL  Final   Culture   Final           BLOOD CULTURE RECEIVED NO GROWTH TO DATE     Report Status PENDING  Incomplete     Scheduled Meds: . antiseptic oral rinse  7 mL Mouth Rinse BID  . guaiFENesin  600 mg Oral BID  . ipratropium-albuterol  3 mL Nebulization TID  . mirtazapine  15 mg Oral QHS  . mometasone-formoterol  2 puff Inhalation BID  . nicotine  7 mg Transdermal Daily  . pantoprazole (PROTONIX) IV  40 mg Intravenous Q12H  . piperacillin-tazobactam (ZOSYN)  IV  3.375 g Intravenous Q8H  . sodium chloride  1,000 mL Intravenous Once  . sodium chloride  500 mL Intravenous Once  . sodium chloride  3 mL Intravenous Q12H   Continuous Infusions:

## 2014-03-09 ENCOUNTER — Encounter (HOSPITAL_COMMUNITY): Payer: Self-pay | Admitting: *Deleted

## 2014-03-09 DIAGNOSIS — R1031 Right lower quadrant pain: Secondary | ICD-10-CM | POA: Diagnosis present

## 2014-03-09 DIAGNOSIS — K559 Vascular disorder of intestine, unspecified: Secondary | ICD-10-CM

## 2014-03-09 DIAGNOSIS — M1990 Unspecified osteoarthritis, unspecified site: Secondary | ICD-10-CM | POA: Diagnosis present

## 2014-03-09 DIAGNOSIS — K550 Acute vascular disorders of intestine: Secondary | ICD-10-CM | POA: Diagnosis not present

## 2014-03-09 DIAGNOSIS — K922 Gastrointestinal hemorrhage, unspecified: Secondary | ICD-10-CM

## 2014-03-09 DIAGNOSIS — R1110 Vomiting, unspecified: Secondary | ICD-10-CM

## 2014-03-09 DIAGNOSIS — J9611 Chronic respiratory failure with hypoxia: Secondary | ICD-10-CM | POA: Diagnosis present

## 2014-03-09 DIAGNOSIS — A419 Sepsis, unspecified organism: Secondary | ICD-10-CM

## 2014-03-09 DIAGNOSIS — J449 Chronic obstructive pulmonary disease, unspecified: Secondary | ICD-10-CM | POA: Diagnosis present

## 2014-03-09 DIAGNOSIS — F319 Bipolar disorder, unspecified: Secondary | ICD-10-CM | POA: Diagnosis present

## 2014-03-09 DIAGNOSIS — Z8673 Personal history of transient ischemic attack (TIA), and cerebral infarction without residual deficits: Secondary | ICD-10-CM | POA: Diagnosis not present

## 2014-03-09 DIAGNOSIS — R569 Unspecified convulsions: Secondary | ICD-10-CM | POA: Diagnosis present

## 2014-03-09 DIAGNOSIS — Z885 Allergy status to narcotic agent status: Secondary | ICD-10-CM | POA: Diagnosis not present

## 2014-03-09 DIAGNOSIS — Z7982 Long term (current) use of aspirin: Secondary | ICD-10-CM | POA: Diagnosis not present

## 2014-03-09 DIAGNOSIS — F17210 Nicotine dependence, cigarettes, uncomplicated: Secondary | ICD-10-CM | POA: Diagnosis present

## 2014-03-09 DIAGNOSIS — R1084 Generalized abdominal pain: Secondary | ICD-10-CM

## 2014-03-09 DIAGNOSIS — F4310 Post-traumatic stress disorder, unspecified: Secondary | ICD-10-CM | POA: Diagnosis present

## 2014-03-09 DIAGNOSIS — R160 Hepatomegaly, not elsewhere classified: Secondary | ICD-10-CM | POA: Diagnosis present

## 2014-03-09 DIAGNOSIS — K219 Gastro-esophageal reflux disease without esophagitis: Secondary | ICD-10-CM | POA: Diagnosis present

## 2014-03-09 DIAGNOSIS — D509 Iron deficiency anemia, unspecified: Secondary | ICD-10-CM | POA: Diagnosis present

## 2014-03-09 LAB — URINALYSIS, ROUTINE W REFLEX MICROSCOPIC
BILIRUBIN URINE: NEGATIVE
Glucose, UA: NEGATIVE mg/dL
HGB URINE DIPSTICK: NEGATIVE
Ketones, ur: NEGATIVE mg/dL
LEUKOCYTES UA: NEGATIVE
Nitrite: NEGATIVE
PH: 6.5 (ref 5.0–8.0)
PROTEIN: NEGATIVE mg/dL
Specific Gravity, Urine: 1.006 (ref 1.005–1.030)
Urobilinogen, UA: 0.2 mg/dL (ref 0.0–1.0)

## 2014-03-09 LAB — COMPREHENSIVE METABOLIC PANEL
ALBUMIN: 3.7 g/dL (ref 3.5–5.2)
ALT: 14 U/L (ref 0–35)
AST: 30 U/L (ref 0–37)
Alkaline Phosphatase: 75 U/L (ref 39–117)
Anion gap: 13 (ref 5–15)
BUN: <5 mg/dL — ABNORMAL LOW (ref 6–23)
CALCIUM: 8.9 mg/dL (ref 8.4–10.5)
CO2: 24 mmol/L (ref 19–32)
Chloride: 97 mEq/L (ref 96–112)
Creatinine, Ser: 0.58 mg/dL (ref 0.50–1.10)
Glucose, Bld: 99 mg/dL (ref 70–99)
Potassium: 4 mmol/L (ref 3.5–5.1)
Sodium: 134 mmol/L — ABNORMAL LOW (ref 135–145)
Total Bilirubin: 1 mg/dL (ref 0.3–1.2)
Total Protein: 6.6 g/dL (ref 6.0–8.3)

## 2014-03-09 LAB — CBC
HEMATOCRIT: 36.3 % (ref 36.0–46.0)
Hemoglobin: 11.8 g/dL — ABNORMAL LOW (ref 12.0–15.0)
MCH: 28.8 pg (ref 26.0–34.0)
MCHC: 32.5 g/dL (ref 30.0–36.0)
MCV: 88.5 fL (ref 78.0–100.0)
PLATELETS: 171 10*3/uL (ref 150–400)
RBC: 4.1 MIL/uL (ref 3.87–5.11)
RDW: 13.9 % (ref 11.5–15.5)
WBC: 8.5 10*3/uL (ref 4.0–10.5)

## 2014-03-09 LAB — BASIC METABOLIC PANEL
ANION GAP: 10 (ref 5–15)
BUN: <5 mg/dL — ABNORMAL LOW (ref 6–23)
CALCIUM: 8.6 mg/dL (ref 8.4–10.5)
CHLORIDE: 99 meq/L (ref 96–112)
CO2: 25 mmol/L (ref 19–32)
CREATININE: 0.54 mg/dL (ref 0.50–1.10)
Glucose, Bld: 91 mg/dL (ref 70–99)
POTASSIUM: 3.7 mmol/L (ref 3.5–5.1)
Sodium: 134 mmol/L — ABNORMAL LOW (ref 135–145)

## 2014-03-09 LAB — CBC WITH DIFFERENTIAL/PLATELET
Basophils Absolute: 0 10*3/uL (ref 0.0–0.1)
Basophils Relative: 0 % (ref 0–1)
Eosinophils Absolute: 0.1 10*3/uL (ref 0.0–0.7)
Eosinophils Relative: 1 % (ref 0–5)
HEMATOCRIT: 42.3 % (ref 36.0–46.0)
Hemoglobin: 14.1 g/dL (ref 12.0–15.0)
Lymphocytes Relative: 19 % (ref 12–46)
Lymphs Abs: 2.1 10*3/uL (ref 0.7–4.0)
MCH: 30.3 pg (ref 26.0–34.0)
MCHC: 33.3 g/dL (ref 30.0–36.0)
MCV: 90.8 fL (ref 78.0–100.0)
Monocytes Absolute: 0.8 10*3/uL (ref 0.1–1.0)
Monocytes Relative: 8 % (ref 3–12)
Neutro Abs: 7.9 10*3/uL — ABNORMAL HIGH (ref 1.7–7.7)
Neutrophils Relative %: 72 % (ref 43–77)
Platelets: 163 10*3/uL (ref 150–400)
RBC: 4.66 MIL/uL (ref 3.87–5.11)
RDW: 13.8 % (ref 11.5–15.5)
WBC: 10.9 10*3/uL — AB (ref 4.0–10.5)

## 2014-03-09 LAB — LIPASE, BLOOD: LIPASE: 21 U/L (ref 11–59)

## 2014-03-09 MED ORDER — ALBUTEROL SULFATE HFA 108 (90 BASE) MCG/ACT IN AERS: 2.0000 | INHALATION_SPRAY | Freq: Four times a day (QID) | RESPIRATORY_TRACT | Status: DC | PRN

## 2014-03-09 MED ORDER — OXYMETAZOLINE HCL 0.05 % NA SOLN
1.0000 | Freq: Two times a day (BID) | NASAL | Status: DC
  Administered 2014-03-10 – 2014-03-12 (×4): 1 via NASAL
  Filled 2014-03-09: qty 15

## 2014-03-09 MED ORDER — SODIUM CHLORIDE 0.9 % IV SOLN
INTRAVENOUS | Status: DC
  Administered 2014-03-09 – 2014-03-10 (×3): via INTRAVENOUS

## 2014-03-09 MED ORDER — NICOTINE POLACRILEX 2 MG MT GUM
2.0000 mg | CHEWING_GUM | OROMUCOSAL | Status: DC | PRN
  Filled 2014-03-09: qty 1

## 2014-03-09 MED ORDER — BOOST PLUS PO LIQD
237.0000 mL | Freq: Two times a day (BID) | ORAL | Status: DC
  Administered 2014-03-09 – 2014-03-12 (×6): 237 mL via ORAL
  Filled 2014-03-09 (×13): qty 237

## 2014-03-09 MED ORDER — FENTANYL CITRATE 0.05 MG/ML IJ SOLN
25.0000 ug | INTRAMUSCULAR | Status: DC | PRN
  Administered 2014-03-09 – 2014-03-10 (×6): 50 ug via INTRAVENOUS
  Administered 2014-03-11: 25 ug via INTRAVENOUS
  Administered 2014-03-11 (×2): 50 ug via INTRAVENOUS
  Filled 2014-03-09 (×9): qty 2

## 2014-03-09 MED ORDER — PIPERACILLIN-TAZOBACTAM 3.375 G IVPB
3.3750 g | Freq: Three times a day (TID) | INTRAVENOUS | Status: DC
  Administered 2014-03-09 – 2014-03-12 (×7): 3.375 g via INTRAVENOUS
  Filled 2014-03-09 (×13): qty 50

## 2014-03-09 MED ORDER — PANTOPRAZOLE SODIUM 40 MG PO TBEC
40.0000 mg | DELAYED_RELEASE_TABLET | Freq: Every day | ORAL | Status: DC
  Administered 2014-03-09 – 2014-03-12 (×4): 40 mg via ORAL
  Filled 2014-03-09 (×4): qty 1

## 2014-03-09 MED ORDER — LIDOCAINE 5 % EX OINT: 1.0000 "application " | TOPICAL_OINTMENT | Freq: Two times a day (BID) | CUTANEOUS | Status: DC | PRN

## 2014-03-09 MED ORDER — ASPIRIN EC 81 MG PO TBEC
81.0000 mg | DELAYED_RELEASE_TABLET | Freq: Every day | ORAL | Status: DC
  Administered 2014-03-09 – 2014-03-12 (×4): 81 mg via ORAL
  Filled 2014-03-09 (×5): qty 1

## 2014-03-09 MED ORDER — CHLORHEXIDINE GLUCONATE 0.12 % MT SOLN
15.0000 mL | Freq: Two times a day (BID) | OROMUCOSAL | Status: DC
  Administered 2014-03-09 – 2014-03-12 (×4): 15 mL via OROMUCOSAL
  Filled 2014-03-09 (×4): qty 15

## 2014-03-09 MED ORDER — DOCUSATE SODIUM 100 MG PO CAPS
100.0000 mg | ORAL_CAPSULE | Freq: Every day | ORAL | Status: DC | PRN
  Administered 2014-03-12: 100 mg via ORAL
  Filled 2014-03-09: qty 1

## 2014-03-09 MED ORDER — FLUTICASONE PROPIONATE 50 MCG/ACT NA SUSP
2.0000 | Freq: Every day | NASAL | Status: DC
  Administered 2014-03-10 – 2014-03-12 (×3): 2 via NASAL
  Filled 2014-03-09 (×3): qty 16

## 2014-03-09 MED ORDER — DICYCLOMINE HCL 20 MG PO TABS
20.0000 mg | ORAL_TABLET | Freq: Three times a day (TID) | ORAL | Status: DC
  Administered 2014-03-09 – 2014-03-12 (×10): 20 mg via ORAL
  Filled 2014-03-09 (×13): qty 1

## 2014-03-09 MED ORDER — CYCLOBENZAPRINE HCL 5 MG PO TABS: 5.0000 mg | ORAL_TABLET | Freq: Three times a day (TID) | ORAL | Status: DC | PRN

## 2014-03-09 MED ORDER — VITAMIN D 1000 UNITS PO TABS
5000.0000 [IU] | ORAL_TABLET | Freq: Every day | ORAL | Status: DC
  Administered 2014-03-09 – 2014-03-12 (×4): 5000 [IU] via ORAL
  Filled 2014-03-09 (×5): qty 5

## 2014-03-09 MED ORDER — CETYLPYRIDINIUM CHLORIDE 0.05 % MT LIQD
7.0000 mL | Freq: Two times a day (BID) | OROMUCOSAL | Status: DC
  Administered 2014-03-09 (×2): 7 mL via OROMUCOSAL

## 2014-03-09 MED ORDER — MOMETASONE FURO-FORMOTEROL FUM 100-5 MCG/ACT IN AERO
2.0000 | INHALATION_SPRAY | Freq: Two times a day (BID) | RESPIRATORY_TRACT | Status: DC
  Administered 2014-03-09 – 2014-03-12 (×6): 2 via RESPIRATORY_TRACT
  Filled 2014-03-09 (×2): qty 8.8

## 2014-03-09 MED ORDER — LORATADINE 10 MG PO TABS
10.0000 mg | ORAL_TABLET | Freq: Every evening | ORAL | Status: DC
  Administered 2014-03-09 – 2014-03-11 (×3): 10 mg via ORAL
  Filled 2014-03-09 (×4): qty 1

## 2014-03-09 MED ORDER — FENTANYL CITRATE 0.05 MG/ML IJ SOLN: 50.0000 ug | INTRAMUSCULAR | Status: DC | PRN

## 2014-03-09 MED ORDER — ALBUTEROL SULFATE (2.5 MG/3ML) 0.083% IN NEBU
2.5000 mg | INHALATION_SOLUTION | Freq: Four times a day (QID) | RESPIRATORY_TRACT | Status: DC | PRN
  Administered 2014-03-09 – 2014-03-11 (×5): 2.5 mg via RESPIRATORY_TRACT
  Filled 2014-03-09 (×5): qty 3

## 2014-03-09 MED ORDER — DM-GUAIFENESIN ER 30-600 MG PO TB12
1.0000 | ORAL_TABLET | Freq: Two times a day (BID) | ORAL | Status: DC | PRN
  Filled 2014-03-09: qty 1

## 2014-03-09 MED ORDER — PROMETHAZINE HCL 25 MG/ML IJ SOLN
12.5000 mg | Freq: Four times a day (QID) | INTRAMUSCULAR | Status: DC | PRN
  Administered 2014-03-09 – 2014-03-10 (×4): 12.5 mg via INTRAVENOUS
  Filled 2014-03-09 (×4): qty 1

## 2014-03-09 MED ORDER — DIAZEPAM 5 MG PO TABS
10.0000 mg | ORAL_TABLET | Freq: Two times a day (BID) | ORAL | Status: DC | PRN
  Administered 2014-03-09: 10 mg via ORAL
  Filled 2014-03-09: qty 2

## 2014-03-09 MED ORDER — TIOTROPIUM BROMIDE MONOHYDRATE 18 MCG IN CAPS
18.0000 ug | ORAL_CAPSULE | Freq: Every day | RESPIRATORY_TRACT | Status: DC
  Administered 2014-03-09 – 2014-03-12 (×4): 18 ug via RESPIRATORY_TRACT
  Filled 2014-03-09: qty 5

## 2014-03-09 MED ORDER — NICOTINE 7 MG/24HR TD PT24
7.0000 mg | MEDICATED_PATCH | Freq: Every day | TRANSDERMAL | Status: DC
  Administered 2014-03-09 – 2014-03-12 (×4): 7 mg via TRANSDERMAL
  Filled 2014-03-09 (×5): qty 1

## 2014-03-09 MED ORDER — MIRTAZAPINE 15 MG PO TABS
15.0000 mg | ORAL_TABLET | Freq: Every day | ORAL | Status: DC
  Administered 2014-03-09 – 2014-03-11 (×3): 15 mg via ORAL
  Filled 2014-03-09 (×4): qty 1

## 2014-03-09 MED ORDER — LORAZEPAM 2 MG/ML IJ SOLN
0.5000 mg | Freq: Once | INTRAMUSCULAR | Status: AC
  Administered 2014-03-09: 0.5 mg via INTRAVENOUS
  Filled 2014-03-09: qty 1

## 2014-03-09 MED ORDER — ACETAMINOPHEN 500 MG PO TABS
1000.0000 mg | ORAL_TABLET | Freq: Four times a day (QID) | ORAL | Status: DC | PRN
  Administered 2014-03-09: 1000 mg via ORAL
  Filled 2014-03-09: qty 2

## 2014-03-09 MED ORDER — HYDROMORPHONE HCL 1 MG/ML IJ SOLN
1.0000 mg | INTRAMUSCULAR | Status: DC | PRN
  Administered 2014-03-09 – 2014-03-11 (×6): 1 mg via INTRAVENOUS
  Filled 2014-03-09 (×6): qty 1

## 2014-03-09 NOTE — ED Notes (Signed)
Checked on pt in restroom at this time.  Pt reports that she is fine but is not ready to return to bed.

## 2014-03-09 NOTE — ED Notes (Signed)
Pt reports that IV started by Wonda Olds is in the left arm which is supposed to be restricted extremity.  Per pt request, IV in left AC was d/c and new IV initiated.

## 2014-03-09 NOTE — Progress Notes (Addendum)
ANTIBIOTIC CONSULT NOTE - INITIAL  Pharmacy Consult for  Zosyn   Indication: Intra-abdominal infection   Allergies  Allergen Reactions  . Oxycodone-Acetaminophen Anaphylaxis, Itching and Nausea And Vomiting    Tylox (Pt states, "I can only take Demerol, Oxycontin, and Dilaudid")  . Codeine Nausea And Vomiting  . Hydrocodone Nausea And Vomiting  . Quinolones Hives   Labs:  Recent Labs  03/07/14 0311  03/07/14 1600 03/08/14 0508 03/09/14 0001  WBC  --   < > 9.4 8.9 10.9*  HGB  --   < > 12.9 12.6 14.1  PLT  --   < > 193 199 163  CREATININE 0.56  --   --  0.56 0.58  < > = values in this interval not displayed. Estimated Creatinine Clearance: 81.5 mL/min (by C-G formula based on Cr of 0.58).   Medical History: Past Medical History  Diagnosis Date  . TIA (transient ischemic attack)   . GERD (gastroesophageal reflux disease)   . Hepatomegaly     hx  . Colonic inertia   . Lung abscess     "calcified over"; bilateral   . COPD (chronic obstructive pulmonary disease)   . Stroke 2009    TIA  . PTSD (post-traumatic stress disorder)   . Bipolar disorder   . Depression   . Chronic headache   . Seizures     in past  . Fibromyalgia   . Iron deficiency anemia   . Arthritis   . Acute ischemic colitis 11/01/2012  . Marijuana abuse 2014    positive screen in hospital  . TIA (transient ischemic attack)     Assessment: Pt here with abdominal pain, recent DC from [facility] (where she was on Zosyn), WBC 10.9, renal function good, other labs as above.   Plan:  -Zosyn 3.375G IV q8h to be infused over 4 hours -Trend WBC, temp, renal function   [name] 03/09/2014,4:09 AM   Addendum: As patient's renal function is normal, pharmacy will sign off at this time, but please reconsult if further assistance is needed. Celedonio Miyamoto, PharmD, BCPS Clinical Pharmacist Pager [phone]

## 2014-03-09 NOTE — H&P (Signed)
[facility] History and Physical  [name] QHU:[id number] DOB: 24-Feb-1961 DOA: 03/08/2014  Referring physician: EDP PCP: Kaleen Mask, MD   Chief Complaint: Abdominal pain   HPI: [name] is a 54 y.o. female with past medical history of COPD, chronic resp failure-no longer on continuous O2, tobacco abuse, VDRF in January 2015 secondary to metapneumovirus, bipolar depression, hx/o TIA, GERD who presented to [facility] ED with worsening abdominal pain in right upper quadrant and epigastric area which started the night prior to this admission and woke her up from sleep. Patient reported associated fevers at home, T max 101.3 F, nausea, non bloody vomiting. She did reports dark stool and some red blood in stool. Patient has had colonoscopy in 2011 by Dr. Juanda Chance and at that time it showed suspicion for colonic inertia. Flexible sigmoidoscopy was done by Dr. Leone Payor in 10/2012 and showed non bleeding mucosal ulcerations in the descending colon and splenic flexure that looked like ischemic colitis; biopsies confirmed ischemic type mucosal injury.  Patient was admitted to our service at [facility] on 03/06/14.  On admission, BP was 84/41, HR 76 - 108, RR 12-25, T max 98.7 F and oxygen saturation 89-100% with [place] oxygen support. Blood work revealed WBC count of 15.3, hemoglobin 16.4, otehrwise unremarkable. CT abdomen showed prominent wall thickening and edema of the cecum probably indicating focal colitis but cecal mass not excluded; stool-filled colon to the level of the mid transverse colon with decompression of the distal colon; mass or stricture at this level is not excluded and colonoscopy is suggested for further evaluation; mild wall thickening in the bladder may indicate cystitis.  On the evening of 03/08/14, patient went to walk around to the gift shop because the physical therapist felt she was able to ambulate fine (as documented in note at 2:12).  The nursing staff on the floor didn't realize  where patient had gone, and so assumed she had left AMA and discharged her.  Unknown to them, she was being helped by an RN down at the gift shop, because she had nausea and vomiting while down there.  She has presented to [facility] ED because she doesn't really want to return to [facility] at this time.  Review of Systems: Systems reviewed.  As above, otherwise negative  Past Medical History  Diagnosis Date  . TIA (transient ischemic attack)   . GERD (gastroesophageal reflux disease)   . Hepatomegaly     hx  . Colonic inertia   . Lung abscess     "calcified over"; bilateral   . COPD (chronic obstructive pulmonary disease)   . Stroke 2009    TIA  . PTSD (post-traumatic stress disorder)   . Bipolar disorder   . Depression   . Chronic headache   . Seizures     in past  . Fibromyalgia   . Iron deficiency anemia   . Arthritis   . Acute ischemic colitis 11/01/2012  . Marijuana abuse 2014    positive screen in hospital  . TIA (transient ischemic attack)    Past Surgical History  Procedure Laterality Date  . 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 Cesarean section      x 2  . Esophagogastroduodenoscopy  12/23/2011    Procedure: ESOPHAGOGASTRODUODENOSCOPY (EGD);  Surgeon: Hart Carwin, MD;  Location: [facility];  Service: Endoscopy;  Laterality: N/A;  . Colonoscopy    . Colonoscopy N/A 11/01/2012  Procedure: COLONOSCOPY;  Surgeon: Iva Boop, MD;  Location: [facility] ENDOSCOPY;  Service: Endoscopy;  Laterality: N/A;   Social History:  reports that she has been smoking Cigarettes.  She has a 39 pack-year smoking history. She has never used smokeless tobacco. She reports that she does not drink alcohol or use illicit drugs.  Allergies  Allergen Reactions  . Oxycodone-Acetaminophen Anaphylaxis, Itching and Nausea And Vomiting    Tylox (Pt states, "I can only take Demerol, Oxycontin, and Dilaudid")  . Codeine Nausea And Vomiting  . Hydrocodone Nausea And Vomiting   . Quinolones Hives    Family History  Problem Relation Age of Onset  . Emphysema Father   . Asthma Son   . Clotting disorder Mother   . Rheum arthritis Sister   . Rheum arthritis Brother   . Ovarian cancer Sister      Prior to Admission medications   Medication Sig Start Date End Date Taking? Authorizing Provider  acetaminophen (TYLENOL) 500 MG tablet Take 1,000 mg by mouth every 6 (six) hours as needed for moderate pain.   Yes Historical Provider, MD  albuterol (PROVENTIL) (2.5 MG/3ML) 0.083% nebulizer solution Take 3 mLs (2.5 mg total) by nebulization every 6 (six) hours as needed for shortness of breath. For shortness of breath 12/20/13  Yes Alison Murray, MD  albuterol (VENTOLIN HFA) 108 (90 BASE) MCG/ACT inhaler Inhale 2 puffs into the lungs every 6 (six) hours as needed for wheezing or shortness of breath.    Yes Historical Provider, MD  antiseptic oral rinse (CPC / CETYLPYRIDINIUM CHLORIDE 0.05%) 0.05 % LIQD solution 7 mLs by Mouth Rinse route 2 times daily at 12 noon and 4 pm. 12/20/13  Yes Alison Murray, MD  aspirin EC 81 MG tablet Take 1 tablet (81 mg total) by mouth daily. 10/14/12  Yes Costin Otelia Sergeant, MD  chlorhexidine (PERIDEX) 0.12 % solution 15 mLs by Mouth Rinse route 2 (two) times daily. 12/20/13  Yes Alison Murray, MD  Cholecalciferol (VITAMIN D-3) 5000 UNITS TABS Take 5,000 Units by mouth daily.   Yes Historical Provider, MD  cyclobenzaprine (FLEXERIL) 5 MG tablet Take 1 tablet (5 mg total) by mouth 3 (three) times daily as needed for muscle spasms. 12/20/13  Yes Alison Murray, MD  dextromethorphan-guaiFENesin [facility] DM) 30-600 MG per 12 hr tablet Take 1 tablet by mouth 2 (two) times daily as needed for cough. 12/20/13  Yes Alison Murray, MD  diazepam (VALIUM) 10 MG tablet Take 1 tablet (10 mg total) by mouth every 12 (twelve) hours as needed for anxiety. 12/20/13  Yes Alison Murray, MD  dicyclomine (BENTYL) 20 MG tablet Take 1 tablet (20 mg total) by mouth 3 (three)  times daily before meals. 11/02/12  Yes Dorothea Ogle, MD  docusate sodium 100 MG CAPS Take 100 mg by mouth daily as needed for mild constipation. 12/20/13  Yes Alison Murray, MD  DULERA 100-5 MCG/ACT AERO inhale 2 puffs by mouth twice a day   Yes Barbaraann Share, MD  fluticasone [facility]) 50 MCG/ACT nasal spray Place 2 sprays into both nostrils daily as needed for allergies or rhinitis.   Yes Historical Provider, MD  lactose free nutrition (BOOST PLUS) LIQD Take 237 mLs by mouth 2 (two) times daily between meals. 04/03/13  Yes Nishant Dhungel, MD  lidocaine (XYLOCAINE) 5 % ointment Apply 1 application topically 2 (two) times daily as needed for mild pain.   Yes Historical Provider, MD  loratadine (CLARITIN)  10 MG tablet Take 10 mg by mouth every evening.    Yes Historical Provider, MD  mirtazapine (REMERON) 15 MG tablet Take 1 tablet (15 mg total) by mouth at bedtime. 12/20/13  Yes Alison Murray, MD  mometasone (NASONEX) 50 MCG/ACT nasal spray Place 2 sprays into the nose daily. 01/04/14  Yes Loren Racer, MD  oxyCODONE (ROXICODONE) 15 MG immediate release tablet Take 1 tablet (15 mg total) by mouth every 6 (six) hours as needed for pain (back pain). 12/20/13  Yes Alison Murray, MD  oxymetazoline (AFRIN NASAL SPRAY) 0.05 % nasal spray Place 1 spray into both nostrils 2 (two) times daily. 01/04/14  Yes Loren Racer, MD  pantoprazole (PROTONIX) 40 MG tablet Take 1 tablet (40 mg total) by mouth daily at 6 (six) AM. 11/02/12  Yes Dorothea Ogle, MD  promethazine (PHENERGAN) 25 MG tablet Take 0.5-1 tablets (12.5-25 mg total) by mouth every 6 (six) hours as needed for nausea. 12/20/13  Yes Alison Murray, MD  SPIRIVA HANDIHALER 18 MCG inhalation capsule inhale contents of 1 capsule by mouth once daily 12/01/12  Yes Barbaraann Share, MD  VITAMIN E PO Take 1 capsule by mouth daily.   Yes Historical Provider, MD   Physical Exam: Filed Vitals:   03/09/14 0155  BP: 116/65  Pulse: 99  Temp:   Resp: 23    BP  116/65 mmHg  Pulse 99  Temp(Src) 98.1 F (36.7 C) (Oral)  Resp 23  SpO2 96%  LMP 05/29/2010  General Appearance:    Alert, oriented, no distress, appears stated age  Head:    Normocephalic, atraumatic  Eyes:    PERRL, EOMI, sclera non-icteric        Nose:   Nares without drainage or epistaxis. Mucosa, turbinates normal  Throat:   Moist mucous membranes. Oropharynx without erythema or exudate.  Neck:   Supple. No carotid bruits.  No thyromegaly.  No lymphadenopathy.   Back:     No CVA tenderness, no spinal tenderness  Lungs:     Clear to auscultation bilaterally, without wheezes, rhonchi or rales  Chest wall:    No tenderness to palpitation  Heart:    Regular rate and rhythm without murmurs, gallops, rubs  Abdomen:     Soft, non-tender, nondistended, normal bowel sounds, no organomegaly  Genitalia:    deferred  Rectal:    deferred  Extremities:   No clubbing, cyanosis or edema.  Pulses:   2+ and symmetric all extremities  Skin:   Skin color, texture, turgor normal, no rashes or lesions  Lymph nodes:   Cervical, supraclavicular, and axillary nodes normal  Neurologic:   CNII-XII intact. Normal strength, sensation and reflexes      throughout    Labs on Admission:  Basic Metabolic Panel:  Recent Labs Lab 03/06/14 2146 03/07/14 0311 03/08/14 0508 03/09/14 0001  NA 133* 138 139 134*  K 3.9 3.9 3.9 4.0  CL 97 102 101 97  CO2 GLUCOSE 138* 100* 98 99  BUN 8 7 <5* <5*  CREATININE 0.68 0.56 0.56 0.58  CALCIUM 9.3 8.6 8.8 8.9  MG  --  1.9  --   --   PHOS  --  4.2  --   --    Liver Function Tests:  Recent Labs Lab 03/06/14 2146 03/07/14 0311 03/09/14 0001  AST ALT ALKPHOS 94 77 75  BILITOT 0.8 0.8 1.0  PROT  7.7 6.2 6.6  ALBUMIN 4.4 3.5 3.7    Recent Labs Lab 03/06/14 2146 03/09/14 0001  LIPASE 19 21   No results for input(s): AMMONIA in the last 168 hours. CBC:  Recent Labs Lab 03/06/14 2146 03/07/14 0315 03/07/14 0825  03/07/14 1600 03/08/14 0508 03/09/14 0001  WBC 15.3* 11.9* 10.0 9.4 8.9 10.9*  NEUTROABS 12.2*  --   --   --   --  7.9*  HGB 16.4* 13.4 13.0 12.9 12.6 14.1  HCT 48.5* 42.0 41.1 41.2 40.2 42.3  MCV 90.5 92.5 93.4 94.7 92.8 90.8  PLT 226 216 187 193 199 163   Cardiac Enzymes:  Recent Labs Lab 03/07/14 0315 03/07/14 0825 03/07/14 1435  TROPONINI <0.03 <0.03 <0.03    BNP (last 3 results)  Recent Labs  03/26/13 0401 03/30/13 0330 01/04/14 0042  PROBNP 1536.0* 136.4* 46.5   CBG: No results for input(s): GLUCAP in the last 168 hours.  Radiological Exams on Admission: No results found.  EKG: Independently reviewed.  Assessment/Plan Principal Problem:   Acute ischemic colitis Active Problems:   COPD (chronic obstructive pulmonary disease)   Nausea with vomiting   Lower GI bleed   Sepsis   Abdominal pain, generalized   1. Acute colitis, ischemic colitis recurrence vs infectious 1. History of ischemic colitis previously. 2. Suspect this is the source of guiac positive stool 3. HGB remains stable despite occult blood in stool for 3 days now. 4. Started on zosyn at [facility], will continue this here 5. Supportive care with IVF, fentanyl for pain, antiemetics PRN 6. GI saw patient at [facility], may wish to reconsult them to follow over here, but it looks like outpatient colonoscopy is the plan.  They think this is more likely infectious colitis given sepsis. 2. Sepsis - improved since admission at [facility] 1. IVF and zosyn as above 2. Due to colitis 3. LGIB - see #1 above 4. Abdominal pain - due to colitis, see above 5. COPD - chronic and stable, continue home nebs.    Code Status: Full Code  Family Communication: No family in room Disposition Plan: Admit to inpatient   Time spent: 70 min  GARDNER, JARED M. [facility] Pager [phone]  If 7AM-7PM, please contact the day team taking care of the patient [email or web address] Password [email or web address] 03/09/2014, 4:03 AM

## 2014-03-09 NOTE — Progress Notes (Signed)
Patient ID: [name], female   DOB: Nov 19, 1960, 54 y.o.   MRN: [id number]  [facility] PROGRESS NOTE  [name] WVP:[id number] DOB: August 30, 1960 DOA: 03/08/2014 PCP: Leonard Downing, MD  Brief narrative:  54 year old female with past medical history of COPD, chronic resp failure-no longer on continuous O2, tobacco abuse, VDRF in January 2015 secondary to metapneumovirus, bipolar depression, hx/o TIA, GERD who presented to [facility] ED with worsening abdominal pain in right upper quadrant and epigastric area which started the night prior to this admission and woke her up from sleep. Patient reported associated fevers at home, T max 101.3 F, nausea, non bloody vomiting. She did reports dark stool and some red blood in stool. Patient has had colonoscopy in 2011 by Dr. [name] and at that time it showed suspicion for colonic inertia. Flexible sigmoidoscopy was done by Dr. [name] in 10/2012 and showed non bleeding mucosal ulcerations in the descending colon and splenic flexure that looked like ischemic colitis; biopsies confirmed ischemic type mucosal injury.   CT abdomen showed prominent wall thickening and edema of the cecum probably indicating focal colitis but cecal mass not excluded; stool-filled colon to the level of the mid transverse colon with decompression of the distal colon; mass or stricture at this level is not excluded and colonoscopy is suggested for further evaluation; mild wall thickening in the bladder may indicate cystitis.   Assessment and Plan:    Principal Problem: Abdominal pain / possible colitis - patient admitted with abdominal pain and CT abdomen suggestive of colitis. As noted patient has had flexible sigmoidoscopy was done by Dr. [name] in 10/2012 and showed non bleeding mucosal ulcerations in the descending colon and splenic flexure that looked like ischemic colitis; biopsies confirmed ischemic type mucosal injury. - patient walked to the gift shop on 1/7 and it  was suspected by staff that she had left AMA and therefore she was discharged. She then had to be re-admitted and this this at [facility] - continue zosyn - continue supportive care with IV fluids, analgesia and antiemetics as needed - appreciate GI input   Active Problems: Sepsis - sepsis secondary to possible colitis and questionable cystitis as seen on CT abdomen - sepsis criteria met on admission with hypotension, tachycardia, tachypnea, hypoxia. In addition, WBC count elevated at 15.3.  - WBC now WNL and pt afebrile, continue ABX for now - UA did not show evidence of UTI. CXR did not reveal acute cardiopulmonary process   COPD (chronic obstructive pulmonary disease) / tobacco abuse  - respiratory status is stable at this time.  - continue scheduled Duoneb TID, Dulera inhaler BID - nicotine patch ordered - counseled on smoking cessation   Possible GI bleed - pt has had complaints of dark stool and some red blood per rectum - FOBT (+) - follow Hb   Mild elevation in TSH  - TSH 5.225 - needs to be repeated outpt in about 4 weeks   LE edema, left - No evidence of DVT on Doppler studies  DVT prophylaxis  SCD's while pt is in hospital due to risk of bleeding.  Code Status: Full Family Communication: family not at the bedside this am Disposition Plan: Home when medically stable   IV Access:    Peripheral IV  Procedures and diagnostic studies:    Ct Abdomen Pelvis W Contrast 03/07/2014 Prominent wall thickening and edema of the cecum probably indicating focal colitis but cecal mass not excluded. Stool-filled colon to the level of  the mid transverse colon with decompression of the distal colon. Mass or stricture this level is not excluded and colonoscopy is suggested for further evaluation. Mild wall thickening in the bladder may indicate cystitis. Focal fatty infiltration in the liver.  Dg Chest Port 1 View 03/06/2014 Chronic emphysematous changes and pulmonary  scarring but no definite acute overlying pulmonary process.  Medical Consultants:    GI  Other Consultants:    None  Anti-Infectives:    Zosyn 03/07/2014 -->    Debbe Odea, MD  [place] Pager - [email or web address]  If 7PM-7AM, please contact night-coverage [email or web address] Password [email or web address] 03/09/2014, 4:36 PM   LOS: 1 day   HPI/Subjective: C/o right sided abdominal pain and a headache. Asked to advance diet to solid food. Advised to take a low residue diet and eat only a small amount as her pain may be exacerbated if diet is advanced too fast.   Objective: Filed Vitals:   03/09/14 0415 03/09/14 0443 03/09/14 1450 03/09/14 1520  BP: 113/67 139/72 129/63   Pulse: 92 100 96   Temp:  97.9 F (36.6 C) 98.4 F (36.9 C)   TempSrc:  Axillary Axillary   Resp: 13 15 16    Height:  5' 7.01" (1.702 m)    Weight:  57.652 kg (127 lb 1.6 oz)    SpO2: 94% 94% 90% 93%    Intake/Output Summary (Last 24 hours) at 03/09/14 1636 Last data filed at 03/09/14 1359  Gross per 24 hour  Intake 1429.58 ml  Output      0 ml  Net 1429.58 ml    Exam:   General:  Pt is alert, follows commands appropriately, not in acute distress  Cardiovascular: Regular rhythm, S1/S2, no murmurs, no rubs, no gallops  Respiratory: Clear to auscultation bilaterally, no wheezing, no crackles, no rhonchi  Abdomen: Soft,  Tender in RLQ, non distended, bowel sounds present, no guarding   Data Reviewed: Basic Metabolic Panel:  Recent Labs Lab 03/06/14 2146 03/07/14 0311 03/08/14 0508 03/09/14 0001 03/09/14 0415  NA 133* 138 139 134* 134*  K 3.9 3.9 3.9 4.0 3.7  CL 97 102 101 97 99  CO2 29 30 31 24 25   GLUCOSE 138* 100* 98 99 91  BUN 8 7 <5* <5* <5*  CREATININE 0.68 0.56 0.56 0.58 0.54  CALCIUM 9.3 8.6 8.8 8.9 8.6  MG  --  1.9  --   --   --   PHOS  --  4.2  --   --   --    Liver Function Tests:  Recent Labs Lab 03/06/14 2146 03/07/14 0311 03/09/14 0001  AST 25 17 30   ALT 13 11 14   ALKPHOS 94  77 75  BILITOT 0.8 0.8 1.0  PROT 7.7 6.2 6.6  ALBUMIN 4.4 3.5 3.7    Recent Labs Lab 03/06/14 2146 03/09/14 0001  LIPASE 19 21   CBC:  Recent Labs Lab 03/06/14 2146  03/07/14 0825 03/07/14 1600 03/08/14 0508 03/09/14 0001 03/09/14 0415  WBC 15.3*  < > 10.0 9.4 8.9 10.9* 8.5  NEUTROABS 12.2*  --   --   --   --  7.9*  --   HGB 16.4*  < > 13.0 12.9 12.6 14.1 11.8*  HCT 48.5*  < > 41.1 41.2 40.2 42.3 36.3  MCV 90.5  < > 93.4 94.7 92.8 90.8 88.5  PLT 226  < > 187 193 199 163 171  < > = values in this interval not displayed. Cardiac Enzymes:  Recent Labs  Lab 03/07/14 0315 03/07/14 0825 03/07/14 1435  TROPONINI <0.03 <0.03 <0.03    Recent Results (from the past 240 hour(s))  Blood culture (routine x 2)     Status: None (Preliminary result)   Collection Time: 03/06/14 10:33 PM  Result Value Ref Range Status   Specimen Description BLOOD LEFT ARM  Final   Culture   Final           BLOOD CULTURE RECEIVED NO GROWTH TO DATE    Report Status PENDING  Incomplete  Urine culture     Status: None   Collection Time: 03/07/14  1:01 AM  Result Value Ref Range Status   Specimen Description URINE, CLEAN CATCH  Final   Special Requests NONE  Final   Colony Count NO GROWTH  Final   Report Status 03/08/2014 FINAL  Final  MRSA PCR Screening     Status: None   Collection Time: 03/07/14  3:07 AM  Result Value Ref Range Status   MRSA by PCR NEGATIVE NEGATIVE Final  Blood culture (routine x 2)     Status: None (Preliminary result)   Collection Time: 03/07/14  3:15 AM  Result Value Ref Range Status   Specimen Description BLOOD RIGHT ANTECUBITAL  Final   Culture   Final           BLOOD CULTURE RECEIVED NO GROWTH TO DATE     Report Status PENDING  Incomplete     Scheduled Meds: . antiseptic oral rinse  7 mL Mouth Rinse q12n4p  . aspirin EC  81 mg Oral Daily  . chlorhexidine  15 mL Mouth Rinse BID  . cholecalciferol  5,000 Units Oral Daily  . dicyclomine  20 mg Oral TID AC  .  fluticasone  2 spray Each Nare Daily  . lactose free nutrition  237 mL Oral BID BM  . loratadine  10 mg Oral QPM  . mirtazapine  15 mg Oral QHS  . mometasone-formoterol  2 puff Inhalation BID  . nicotine  7 mg Transdermal Daily  . oxymetazoline  1 spray Each Nare BID  . pantoprazole  40 mg Oral Q0600  . piperacillin-tazobactam (ZOSYN)  IV  3.375 g Intravenous 3 times per day  . tiotropium  18 mcg Inhalation Daily   Continuous Infusions: . sodium chloride 125 mL/hr at 03/09/14 1300

## 2014-03-09 NOTE — ED Notes (Signed)
Transporting patient to new room assignment. 

## 2014-03-10 DIAGNOSIS — J438 Other emphysema: Secondary | ICD-10-CM

## 2014-03-10 LAB — BASIC METABOLIC PANEL
Anion gap: 9 (ref 5–15)
BUN: <5 mg/dL — ABNORMAL LOW (ref 6–23)
CO2: 28 mmol/L (ref 19–32)
Calcium: 8.3 mg/dL — ABNORMAL LOW (ref 8.4–10.5)
Chloride: 104 mEq/L (ref 96–112)
Creatinine, Ser: 0.59 mg/dL (ref 0.50–1.10)
GFR calc non Af Amer: >90 mL/min (ref >90)
Glucose, Bld: 83 mg/dL (ref 70–99)
Potassium: 3.6 mmol/L (ref 3.5–5.1)
Sodium: 141 mmol/L (ref 135–145)

## 2014-03-10 LAB — CBC
HCT: 35.8 % — ABNORMAL LOW (ref 36.0–46.0)
HEMOGLOBIN: 11.4 g/dL — AB (ref 12.0–15.0)
MCH: 28.8 pg (ref 26.0–34.0)
MCHC: 31.8 g/dL (ref 30.0–36.0)
MCV: 90.4 fL (ref 78.0–100.0)
PLATELETS: 184 10*3/uL (ref 150–400)
RBC: 3.96 MIL/uL (ref 3.87–5.11)
RDW: 14.2 % (ref 11.5–15.5)
WBC: 6.4 10*3/uL (ref 4.0–10.5)

## 2014-03-10 MED ORDER — LORAZEPAM 1 MG PO TABS
1.0000 mg | ORAL_TABLET | Freq: Two times a day (BID) | ORAL | Status: DC
  Administered 2014-03-10 – 2014-03-12 (×5): 1 mg via ORAL
  Filled 2014-03-10 (×5): qty 1

## 2014-03-10 MED ORDER — OXYCODONE HCL 5 MG PO TABS: 5.0000 mg | ORAL_TABLET | ORAL | Status: DC

## 2014-03-10 MED ORDER — MAGIC MOUTHWASH
5.0000 mL | Freq: Three times a day (TID) | ORAL | Status: DC
  Administered 2014-03-10 – 2014-03-12 (×4): 5 mL via ORAL
  Filled 2014-03-10 (×9): qty 5

## 2014-03-10 MED ORDER — DIPHENHYDRAMINE HCL 50 MG PO CAPS
50.0000 mg | ORAL_CAPSULE | Freq: Every day | ORAL | Status: DC
  Administered 2014-03-10: 25 mg via ORAL
  Administered 2014-03-11: 50 mg via ORAL
  Filled 2014-03-10 (×2): qty 1
  Filled 2014-03-10 (×2): qty 2
  Filled 2014-03-10: qty 1

## 2014-03-10 NOTE — Progress Notes (Signed)
Patient ID: [name], female   DOB: 10-22-1960, 54 y.o.   MRN: [id number]  [facility] PROGRESS NOTE  IYLA BALZARINI WUJ:[id number] DOB: November 27, 1960 DOA: 03/08/2014 PCP: Leonard Downing, MD  Brief narrative:  54 year old female with past medical history of COPD, chronic resp failure-no longer on continuous O2, tobacco abuse, VDRF in January 2015 secondary to metapneumovirus, bipolar depression, hx/o TIA, GERD who presented to [facility] ED with worsening abdominal pain in right upper quadrant and epigastric area which started the night prior to this admission and woke her up from sleep. Patient reported associated fevers at home, T max 101.3 F, nausea, non bloody vomiting. She did reports dark stool and some red blood in stool. Patient has had colonoscopy in 2011 by Dr. [name] and at that time it showed suspicion for colonic inertia. Flexible sigmoidoscopy was done by Dr. [name] in 10/2012 and showed non bleeding mucosal ulcerations in the descending colon and splenic flexure that looked like ischemic colitis; biopsies confirmed ischemic type mucosal injury.   CT abdomen showed prominent wall thickening and edema of the cecum probably indicating focal colitis but cecal mass not excluded; stool-filled colon to the level of the mid transverse colon with decompression of the distal colon; mass or stricture at this level is not excluded and colonoscopy is suggested for further evaluation; mild wall thickening in the bladder may indicate cystitis.    HPI/Subjective: Continues to have uncontrolled abdominal pain- RLQ- unable to sleep last night due to pain.   Assessment and Plan:    Principal Problem: Abdominal pain / possible colitis - patient admitted with abdominal pain and CT abdomen suggestive of colitis. As noted patient has had flexible sigmoidoscopy was done by Dr. [name] in 10/2012 and showed non bleeding mucosal ulcerations in the descending colon and splenic flexure that looked like  ischemic colitis; biopsies confirmed ischemic type mucosal injury. - patient walked to the gift shop on 1/7 and it was suspected by staff that she had left AMA and therefore she was discharged. She then had to be re-admitted and this this at [facility] - continue zosyn - continue supportive care with IV fluids, analgesia and antiemetics as needed - wanting to try solids - is tolerating small amounts of food thus far. continues to state pain is severe- added Oxycodone.   Active Problems: Sepsis - sepsis secondary to possible colitis and questionable cystitis as seen on CT abdomen - sepsis criteria met on admission with hypotension, tachycardia, tachypnea, hypoxia. In addition, WBC count elevated at 15.3.  - WBC now WNL and pt afebrile, continue ABX for now - UA did not show evidence of UTI. CXR did not reveal acute cardiopulmonary process   COPD (chronic obstructive pulmonary disease) / tobacco abuse  - respiratory status is stable at this time.  - continue scheduled Duoneb TID, Dulera inhaler BID - nicotine patch ordered - counseled on smoking cessation   Possible GI bleed - pt has had complaints of dark stool and some red blood per rectum - FOBT (+) - follow Hb   Mild elevation in TSH  - TSH 5.225 - needs to be repeated outpt in about 4 weeks   LE edema, left - No evidence of DVT on Doppler studies  DVT prophylaxis  SCD's while pt is in hospital due to risk of bleeding.  Code Status: Full Family Communication: family not at the bedside this am Disposition Plan: Home when medically stable   IV Access:    Peripheral IV  Procedures and diagnostic studies:    Ct Abdomen Pelvis W Contrast 03/07/2014 Prominent wall thickening and edema of the cecum probably indicating focal colitis but cecal mass not excluded. Stool-filled colon to the level of the mid transverse colon with decompression of the distal colon. Mass or stricture this level is not excluded and  colonoscopy is suggested for further evaluation. Mild wall thickening in the bladder may indicate cystitis. Focal fatty infiltration in the liver.  Dg Chest Port 1 View 03/06/2014 Chronic emphysematous changes and pulmonary scarring but no definite acute overlying pulmonary process.  Medical Consultants:    GI  Other Consultants:    None  Anti-Infectives:    Zosyn 03/07/2014 -->    Debbe Odea, MD  [place] Pager - [email or web address]  If 7PM-7AM, please contact night-coverage [email or web address] Password [email or web address] 03/10/2014, 12:17 PM   LOS: 2 days    Objective: Filed Vitals:   03/09/14 2122 03/10/14 0100 03/10/14 0500 03/10/14 0934  BP: 119/69  112/62   Pulse: 89  84   Temp: 99 F (37.2 C)  98.1 F (36.7 C)   TempSrc: Axillary  Oral   Resp: 16  17   Height:      Weight:      SpO2: 92% 100% 90% 87%    Intake/Output Summary (Last 24 hours) at 03/10/14 1217 Last data filed at 03/10/14 0653  Gross per 24 hour  Intake 3411.58 ml  Output      0 ml  Net 3411.58 ml    Exam:   General:  Pt is alert, follows commands appropriately, not in acute distress  Cardiovascular: Regular rhythm, S1/S2, no murmurs, no rubs, no gallops  Respiratory: Clear to auscultation bilaterally, no wheezing, no crackles, no rhonchi  Abdomen: Soft,  Tender in RLQ, non distended, bowel sounds present, no guarding   Data Reviewed: Basic Metabolic Panel:  Recent Labs Lab 03/07/14 0311 03/08/14 0508 03/09/14 0001 03/09/14 0415 03/10/14 0332  NA 138 139 134* 134* 141  K 3.9 3.9 4.0 3.7 3.6  CL 102 101 97 99 104  CO2 _0 GLUCOSE 100* 98 99 91 83  BUN 7 <5* <5* <5* <5*  CREATININE 0.56 0.56 0.58 0.54 0.59  CALCIUM 8.6 8.8 8.9 8.6 8.3*  MG 1.9  --   --   --   --   PHOS 4.2  --   --   --   --    Liver Function Tests:  Recent Labs Lab 03/06/14 2146 03/07/14 0311 03/09/14 0001  AST _1 ALT _2 ALKPHOS 94 77 75  BILITOT 0.8 0.8 1.0  PROT 7.7 6.2 6.6  ALBUMIN  4.4 3.5 3.7    Recent Labs Lab 03/06/14 2146 03/09/14 0001  LIPASE 19 21   CBC:  Recent Labs Lab 03/06/14 2146  03/07/14 1600 03/08/14 0508 03/09/14 0001 03/09/14 0415 03/10/14 0332  WBC 15.3*  < > 9.4 8.9 10.9* 8.5 6.4  NEUTROABS 12.2*  --   --   --  7.9*  --   --   HGB 16.4*  < > 12.9 12.6 14.1 11.8* 11.4*  HCT 48.5*  < > 41.2 40.2 42.3 36.3 35.8*  MCV 90.5  < > 94.7 92.8 90.8 88.5 90.4  PLT 226  < > 193 199 163 171 184  < > = values in this interval not displayed. Cardiac Enzymes:  Recent Labs Lab 03/07/14 0315 03/07/14 0825 03/07/14 1435  TROPONINI <0.03 <0.03 <0.03  Recent Results (from the past 240 hour(s))  Blood culture (routine x 2)     Status: None (Preliminary result)   Collection Time: 03/06/14 10:33 PM  Result Value Ref Range Status   Specimen Description BLOOD LEFT ARM  Final   Culture   Final           BLOOD CULTURE RECEIVED NO GROWTH TO DATE    Report Status PENDING  Incomplete  Urine culture     Status: None   Collection Time: 03/07/14  1:01 AM  Result Value Ref Range Status   Specimen Description URINE, CLEAN CATCH  Final   Special Requests NONE  Final   Colony Count NO GROWTH  Final   Report Status 03/08/2014 FINAL  Final  MRSA PCR Screening     Status: None   Collection Time: 03/07/14  3:07 AM  Result Value Ref Range Status   MRSA by PCR NEGATIVE NEGATIVE Final  Blood culture (routine x 2)     Status: None (Preliminary result)   Collection Time: 03/07/14  3:15 AM  Result Value Ref Range Status   Specimen Description BLOOD RIGHT ANTECUBITAL  Final   Culture   Final           BLOOD CULTURE RECEIVED NO GROWTH TO DATE     Report Status PENDING  Incomplete     Scheduled Meds: . aspirin EC  81 mg Oral Daily  . chlorhexidine  15 mL Mouth Rinse BID  . cholecalciferol  5,000 Units Oral Daily  . dicyclomine  20 mg Oral TID AC  . diphenhydrAMINE  50 mg Oral QHS  . fluticasone  2 spray Each Nare Daily  . lactose free nutrition  237 mL  Oral BID BM  . loratadine  10 mg Oral QPM  . LORazepam  1 mg Oral BID  . mirtazapine  15 mg Oral QHS  . mometasone-formoterol  2 puff Inhalation BID  . nicotine  7 mg Transdermal Daily  . oxymetazoline  1 spray Each Nare BID  . pantoprazole  40 mg Oral Q0600  . piperacillin-tazobactam (ZOSYN)  IV  3.375 g Intravenous 3 times per day  . tiotropium  18 mcg Inhalation Daily   Continuous Infusions: . sodium chloride 125 mL/hr at 03/10/14 1055

## 2014-03-11 ENCOUNTER — Inpatient Hospital Stay (HOSPITAL_COMMUNITY): Payer: Medicaid Other

## 2014-03-11 LAB — GLUCOSE, CAPILLARY: GLUCOSE-CAPILLARY: 94 mg/dL (ref 70–99)

## 2014-03-11 MED ORDER — HYDROMORPHONE HCL 2 MG PO TABS
1.0000 mg | ORAL_TABLET | Freq: Four times a day (QID) | ORAL | Status: DC | PRN
  Administered 2014-03-11 – 2014-03-12 (×3): 2 mg via ORAL
  Filled 2014-03-11 (×3): qty 1

## 2014-03-11 NOTE — Progress Notes (Signed)
PROGRESS NOTE  [name] JZP:[id number] DOB: 1960/11/25 DOA: 03/08/2014 PCP: Leonard Downing, MD  HPI: 54 year old female with past medical history of COPD, chronic resp failure-no longer on continuous O2, tobacco abuse, VDRF in January 2015 secondary to metapneumovirus, bipolar depression, hx/o TIA, GERD who presented to [facility] ED with worsening abdominal pain in right upper quadrant and epigastric area which started the night prior to this admission and woke her up from sleep. Patient reported associated fevers at home, T max 101.3 F, nausea, non bloody vomiting. She did reports dark stool and some red blood in stool. Patient has had colonoscopy in 2011 by Dr. [name] and at that time it showed suspicion for colonic inertia. Flexible sigmoidoscopy was done by Dr. [name] in 10/2012 and showed non bleeding mucosal ulcerations in the descending colon and splenic flexure that looked like ischemic colitis; biopsies confirmed ischemic type mucosal injury.  CT abdomen showed prominent wall thickening and edema of the cecum probably indicating focal colitis but cecal mass not excluded; stool-filled colon to the level of the mid transverse colon with decompression of the distal colon; mass or stricture at this level is not excluded and colonoscopy is suggested for further evaluation; mild wall thickening in the bladder may indicate cystitis.   Subjective / 24 H Interval events - patient continues to complain of abdominal pain   Assessment/Plan: Principal Problem:   Acute ischemic colitis Active Problems:   COPD (chronic obstructive pulmonary disease)   Nausea with vomiting   Lower GI bleed   Sepsis   Abdominal pain, generalized   Right lower quadrant abdominal pain   Abdominal pain / possible colitis - patient admitted with abdominal pain and CT abdomen suggestive of colitis. As noted patient has had flexible sigmoidoscopy was done by Dr. [name] in 10/2012 and showed non bleeding mucosal  ulcerations in the descending colon and splenic flexure that looked like ischemic colitis; biopsies confirmed ischemic type mucosal injury. - patient walked to the gift shop on 1/7 and it was suspected by staff that she had left AMA and therefore she was discharged (from [place]). She then had to be re-admitted and this time at [facility] - continue zosyn - continue supportive care with IV fluids, analgesia and antiemetics as needed - wanting to try solids - is tolerating small amounts of food thus far. continues to state pain is severe - discussed again today with GI, if not improving will re-consult but otherwise plans for outpatient colonoscopy   Sepsis - sepsis secondary to possible colitis and questionable cystitis as seen on CT abdomen - sepsis criteria met on admission with hypotension, tachycardia, tachypnea, hypoxia. In addition, WBC count elevated at 15.3.  - WBC now WNL and pt afebrile, continue ABX for now - UA did not show evidence of UTI. CXR did not reveal acute cardiopulmonary process   COPD (chronic obstructive pulmonary disease) / tobacco abuse  - respiratory status is stable at this time.  - continue scheduled Duoneb TID, Dulera inhaler BID - nicotine patch ordered - counseled on smoking cessation   Possible GI bleed - pt has had complaints of dark stool and some red blood per rectum - FOBT (+) - follow Hb, overall stable  Mild elevation in TSH  - TSH 5.225 - needs to be repeated outpt in about 4 weeks   LE edema, left - No evidence of DVT on Doppler studies   Diet: DIET SOFT Fluids: none  DVT Prophylaxis: SCD  Code Status: Full  Code Family Communication: none   Disposition Plan: remain inpatient  Consultants:  None   Procedures:  None    Antibiotics  Anti-infectives    Start     Dose/Rate Route Frequency Ordered Stop   03/09/14 0415  piperacillin-tazobactam (ZOSYN) IVPB 3.375 g     3.375 g12.5 mL/hr over 240 Minutes Intravenous 3 times per  day 03/09/14 0414         Studies  No results found.  Objective  Filed Vitals:   03/10/14 0934 03/10/14 2050 03/10/14 2157 03/11/14 0535  BP:   150/76 121/65  Pulse:   97 80  Temp:   98.8 F (37.1 C) 98.4 F (36.9 C)  TempSrc:   Oral Oral  Resp:   16 16  Height:      Weight:      SpO2: 87% 94% 97% 95%   No intake or output data in the 24 hours ending 03/11/14 0849 Filed Weights   03/09/14 0443  Weight: 57.652 kg (127 lb 1.6 oz)    Exam:  General:  NAD  HEENT: no scleral icterus  Cardiovascular: RRR  Respiratory: CTA biL  Abdomen: soft, RLQ tenderness to palpation  MSK/Extremities: no clubbing/cyanosis  Skin: no rashes  Neuro: non focal  Data Reviewed: Basic Metabolic Panel:  Recent Labs Lab 03/07/14 0311 03/08/14 0508 03/09/14 0001 03/09/14 0415 03/10/14 0332  NA 138 139 134* 134* 141  K 3.9 3.9 4.0 3.7 3.6  CL 102 101 97 99 104  CO2 30 31 24 25 28   GLUCOSE 100* 98 99 91 83  BUN 7 <5* <5* <5* <5*  CREATININE 0.56 0.56 0.58 0.54 0.59  CALCIUM 8.6 8.8 8.9 8.6 8.3*  MG 1.9  --   --   --   --   PHOS 4.2  --   --   --   --    Liver Function Tests:  Recent Labs Lab 03/06/14 2146 03/07/14 0311 03/09/14 0001  AST 25 17 30   ALT 13 11 14   ALKPHOS 94 77 75  BILITOT 0.8 0.8 1.0  PROT 7.7 6.2 6.6  ALBUMIN 4.4 3.5 3.7    Recent Labs Lab 03/06/14 2146 03/09/14 0001  LIPASE 19 21   CBC:  Recent Labs Lab 03/06/14 2146  03/07/14 1600 03/08/14 0508 03/09/14 0001 03/09/14 0415 03/10/14 0332  WBC 15.3*  < > 9.4 8.9 10.9* 8.5 6.4  NEUTROABS 12.2*  --   --   --  7.9*  --   --   HGB 16.4*  < > 12.9 12.6 14.1 11.8* 11.4*  HCT 48.5*  < > 41.2 40.2 42.3 36.3 35.8*  MCV 90.5  < > 94.7 92.8 90.8 88.5 90.4  PLT 226  < > 193 199 163 171 184  < > = values in this interval not displayed. Cardiac Enzymes:  Recent Labs Lab 03/07/14 0315 03/07/14 0825 03/07/14 1435  TROPONINI <0.03 <0.03 <0.03   BNP (last 3 results)  Recent Labs   03/26/13 0401 03/30/13 0330 01/04/14 0042  PROBNP 1536.0* 136.4* 46.5   CBG: No results for input(s): GLUCAP in the last 168 hours.  Recent Results (from the past 240 hour(s))  Blood culture (routine x 2)     Status: None (Preliminary result)   Collection Time: 03/06/14 10:33 PM  Result Value Ref Range Status   Specimen Description BLOOD LEFT ARM  Final   Special Requests BOTTLES DRAWN AEROBIC AND ANAEROBIC 3CC  Final   Culture   Final  BLOOD CULTURE RECEIVED NO GROWTH TO DATE CULTURE WILL BE HELD FOR 5 DAYS BEFORE ISSUING A FINAL NEGATIVE REPORT Performed at [facility]    Report Status PENDING  Incomplete  Urine culture     Status: None   Collection Time: 03/07/14  1:01 AM  Result Value Ref Range Status   Specimen Description URINE, CLEAN CATCH  Final   Special Requests NONE  Final   Colony Count NO GROWTH Performed at [facility]   Final   Culture NO GROWTH Performed at [facility]   Final   Report Status 03/08/2014 FINAL  Final  MRSA PCR Screening     Status: None   Collection Time: 03/07/14  3:07 AM  Result Value Ref Range Status   MRSA by PCR NEGATIVE NEGATIVE Final    Comment:        The GeneXpert MRSA Assay (FDA approved for NASAL specimens only), is one component of a comprehensive MRSA colonization surveillance program. It is not intended to diagnose MRSA infection nor to guide or monitor treatment for MRSA infections.   Blood culture (routine x 2)     Status: None (Preliminary result)   Collection Time: 03/07/14  3:15 AM  Result Value Ref Range Status   Specimen Description BLOOD RIGHT ANTECUBITAL  Final   Special Requests BOTTLES DRAWN AEROBIC ONLY 5ML  Final   Culture   Final           BLOOD CULTURE RECEIVED NO GROWTH TO DATE CULTURE WILL BE HELD FOR 5 DAYS BEFORE ISSUING A FINAL NEGATIVE REPORT Performed at [facility]    Report Status PENDING  Incomplete     Scheduled Meds: . aspirin EC  81 mg Oral  Daily  . chlorhexidine  15 mL Mouth Rinse BID  . cholecalciferol  5,000 Units Oral Daily  . dicyclomine  20 mg Oral TID AC  . diphenhydrAMINE  50 mg Oral QHS  . fluticasone  2 spray Each Nare Daily  . lactose free nutrition  237 mL Oral BID BM  . loratadine  10 mg Oral QPM  . LORazepam  1 mg Oral BID  . magic mouthwash  5 mL Oral TID  . mirtazapine  15 mg Oral QHS  . mometasone-formoterol  2 puff Inhalation BID  . nicotine  7 mg Transdermal Daily  . oxymetazoline  1 spray Each Nare BID  . pantoprazole  40 mg Oral Q0600  . piperacillin-tazobactam (ZOSYN)  IV  3.375 g Intravenous 3 times per day  . tiotropium  18 mcg Inhalation Daily   Continuous Infusions:   Marzetta Board, MD [facility] Pager [phone]. If 7 PM - 7 AM, please contact night-coverage at [email or web address], password [facility] 03/11/2014, 8:49 AM  LOS: 3 days

## 2014-03-12 LAB — CBC
HCT: 39 % (ref 36.0–46.0)
Hemoglobin: 12.8 g/dL (ref 12.0–15.0)
MCH: 29.2 pg (ref 26.0–34.0)
MCHC: 32.8 g/dL (ref 30.0–36.0)
MCV: 88.8 fL (ref 78.0–100.0)
Platelets: 225 10*3/uL (ref 150–400)
RBC: 4.39 MIL/uL (ref 3.87–5.11)
RDW: 14.3 % (ref 11.5–15.5)
WBC: 5.3 10*3/uL (ref 4.0–10.5)

## 2014-03-12 MED ORDER — OXYCODONE HCL 15 MG PO TABS: 15.0000 mg | ORAL_TABLET | Freq: Four times a day (QID) | ORAL | Status: DC | PRN

## 2014-03-12 MED ORDER — METRONIDAZOLE 500 MG PO TABS: 500.0000 mg | ORAL_TABLET | Freq: Three times a day (TID) | ORAL | Status: DC

## 2014-03-12 MED ORDER — HYDROMORPHONE HCL 2 MG PO TABS: 1.0000 mg | ORAL_TABLET | Freq: Four times a day (QID) | ORAL | Status: DC | PRN

## 2014-03-12 NOTE — Discharge Summary (Signed)
Physician Discharge Summary  [name]:[id number] DOB: [birth year] DOA: 03/08/2014  PCP: Leonard Downing, MD  Admit date: 03/08/2014 Discharge date: 03/12/2014  Time spent: 45 minutes  Recommendations for Outpatient Follow-up:  1. Follow up with Dr. [name] in 1-2 weeks 2. Follow up with Dr. [name] for repeat colonoscopy in 2-4 weeks once acute colitis improves  Discharge Diagnoses:  Principal Problem:   Acute ischemic colitis Active Problems:   COPD (chronic obstructive pulmonary disease)   Nausea with vomiting   Lower GI bleed   Sepsis   Abdominal pain, generalized   Right lower quadrant abdominal pain  Discharge Condition: stable  Diet recommendation: regular  Filed Weights   03/09/14 0443  Weight: 57.652 kg (127 lb 1.6 oz)   History of present illness:  54 year old female with past medical history of COPD, chronic resp failure-no longer on continuous O2, tobacco abuse, VDRF in January 2015 secondary to metapneumovirus, bipolar depression, hx/o TIA, GERD who presented to [facility] ED with worsening abdominal pain in right upper quadrant and epigastric area which started the night prior to this admission and woke her up from sleep. Patient reported associated fevers at home, T max 101.3 F, nausea, non bloody vomiting. She did reports dark stool and some red blood in stool. Patient has had colonoscopy in 2011 by Dr. [name] and at that time it showed suspicion for colonic inertia. Flexible sigmoidoscopy was done by Dr. [name] in 10/2012 and showed non bleeding mucosal ulcerations in the descending colon and splenic flexure that looked like ischemic colitis; biopsies confirmed ischemic type mucosal injury. CT abdomen showed prominent wall thickening and edema of the cecum probably indicating focal colitis but cecal mass not excluded; stool-filled colon to the level of the mid transverse colon with decompression of the distal colon; mass or stricture at this level is not excluded  and colonoscopy is suggested for further evaluation; mild wall thickening in the bladder may indicate cystitis.   Hospital Course:  Abdominal pain / possible colitis - patient admitted with abdominal pain and CT abdomen suggestive of colitis. As noted patient has had flexible sigmoidoscopy was done by Dr. [name] in 10/2012 and showed non bleeding mucosal ulcerations in the descending colon and splenic flexure that looked like ischemic colitis; biopsies confirmed ischemic type mucosal injury. Patient was initially admitted to [facility], and she walked to the gift shop on 1/7 and it was suspected by staff that she had left AMA and therefore she was discharged. She then had to be re-admitted and this time at [facility]. Gastroenterology was consulted and saw patient while hospitalized. It was felt like her presentation is more consistent with focal infectious colitis, less likely neoplasm however patient does need a repeat colonoscopy which can be done as an outpatient once her acute episode resolves. She improved significantly with antibiotics, intermittently refusing Zosyn however, she was transitioned to Metronidazole which she is to continue for 5 additional days. Her abdominal pain has significantly improved, she was able to tolerate solid food without nausea/vomiting or abdominal pain and was discharged home in stable condition.  Sepsis - sepsis secondary to possible colitis and questionable cystitis as seen on CT abdomen - sepsis criteria met on admission with hypotension, tachycardia, tachypnea, hypoxia. In addition, WBC count elevated at 15.3. WBC now WNL and pt afebrile, continue ABX for presumed colitis. UA did not show evidence of UTI. CXR did not reveal acute cardiopulmonary process  COPD (chronic obstructive pulmonary disease) / tobacco abuse  -  respiratory status is stable at this time.  - continue scheduled Duoneb TID, Dulera inhaler BID - nicotine patch ordered - counseled on  smoking cessation  Possible GI bleed - pt has had complaints of dark stool and some red blood per rectum - FOBT (+) likely due to colitis, Hb stable and within normal range on discharge.  Mild elevation in TSH  - TSH 5.225 - needs to be repeated outpt in about 4 weeks  LE edema, left - No evidence of DVT on Doppler studies  Procedures:  None    Consultations:  Gastroenterology   Discharge Exam: Filed Vitals:   03/10/14 2157 03/11/14 0535 03/11/14 2145 03/12/14 0602  BP: 150/76 121/65 181/77 122/69  Pulse: 97 80 94 77  Temp: 98.8 F (37.1 C) 98.4 F (36.9 C) 98.4 F (36.9 C) 98.2 F (36.8 C)  TempSrc: Oral Oral Oral Oral  Resp: _0 Height:      Weight:      SpO2: 97% 95% 96% 95%   General: NAD Cardiovascular: RRR Respiratory: CTA biL  Discharge Instructions    Medication List    STOP taking these medications        acetaminophen 500 MG tablet  Commonly known as:  TYLENOL     oxyCODONE 15 MG immediate release tablet  Commonly known as:  ROXICODONE      TAKE these medications        antiseptic oral rinse 0.05 % Liqd solution  Commonly known as:  CPC / CETYLPYRIDINIUM CHLORIDE 0.05%  7 mLs by Mouth Rinse route 2 times daily at 12 noon and 4 pm.     aspirin EC 81 MG tablet  Take 1 tablet (81 mg total) by mouth daily.     chlorhexidine 0.12 % solution  Commonly known as:  PERIDEX  15 mLs by Mouth Rinse route 2 (two) times daily.     cyclobenzaprine 5 MG tablet  Commonly known as:  FLEXERIL  Take 1 tablet (5 mg total) by mouth 3 (three) times daily as needed for muscle spasms.     dextromethorphan-guaiFENesin 30-600 MG per 12 hr tablet  Commonly known as:  MUCINEX DM  Take 1 tablet by mouth 2 (two) times daily as needed for cough.     diazepam 10 MG tablet  Commonly known as:  VALIUM  Take 1 tablet (10 mg total) by mouth every 12 (twelve) hours as needed for anxiety.     dicyclomine 20 MG tablet  Commonly known as:  BENTYL  Take 1  tablet (20 mg total) by mouth 3 (three) times daily before meals.     DSS 100 MG Caps  Take 100 mg by mouth daily as needed for mild constipation.     DULERA 100-5 MCG/ACT Aero  Generic drug:  mometasone-formoterol  inhale 2 puffs by mouth twice a day     fluticasone 50 MCG/ACT nasal spray  Commonly known as:  FLONASE  Place 2 sprays into both nostrils daily as needed for allergies or rhinitis.     HYDROmorphone 2 MG tablet  Commonly known as:  DILAUDID  Take 0.5-1 tablets (1-2 mg total) by mouth every 6 (six) hours as needed for severe pain.     lactose free nutrition Liqd  Take 237 mLs by mouth 2 (two) times daily between meals.     lidocaine 5 % ointment  Commonly known as:  XYLOCAINE  Apply 1 application topically 2 (two) times daily as needed for mild  pain.     loratadine 10 MG tablet  Commonly known as:  CLARITIN  Take 10 mg by mouth every evening.     metroNIDAZOLE 500 MG tablet  Commonly known as:  FLAGYL  Take 1 tablet (500 mg total) by mouth 3 (three) times daily.     mirtazapine 15 MG tablet  Commonly known as:  REMERON  Take 1 tablet (15 mg total) by mouth at bedtime.     mometasone 50 MCG/ACT nasal spray  Commonly known as:  NASONEX  Place 2 sprays into the nose daily.     oxymetazoline 0.05 % nasal spray  Commonly known as:  AFRIN NASAL SPRAY  Place 1 spray into both nostrils 2 (two) times daily.     pantoprazole 40 MG tablet  Commonly known as:  PROTONIX  Take 1 tablet (40 mg total) by mouth daily at 6 (six) AM.     promethazine 25 MG tablet  Commonly known as:  PHENERGAN  Take 0.5-1 tablets (12.5-25 mg total) by mouth every 6 (six) hours as needed for nausea.     SPIRIVA HANDIHALER 18 MCG inhalation capsule  Generic drug:  tiotropium  inhale contents of 1 capsule by mouth once daily     VENTOLIN HFA 108 (90 BASE) MCG/ACT inhaler  Generic drug:  albuterol  Inhale 2 puffs into the lungs every 6 (six) hours as needed for wheezing or shortness of  breath.     albuterol (2.5 MG/3ML) 0.083% nebulizer solution  Commonly known as:  PROVENTIL  Take 3 mLs (2.5 mg total) by nebulization every 6 (six) hours as needed for shortness of breath. For shortness of breath     Vitamin D-3 5000 UNITS Tabs  Take 5,000 Units by mouth daily.     VITAMIN E PO  Take 1 capsule by mouth daily.           Follow-up Information    Follow up with Leonard Downing, MD. Schedule an appointment as soon as possible for a visit in 2 weeks.   Specialty:  Family Medicine   Contact information:   [place]  [address] [phone]       Follow up with Delfin Edis, MD. Schedule an appointment as soon as possible for a visit in 2 weeks.   Specialty:  Gastroenterology   Contact information:   [address] [place] [address] [phone]      The results of significant diagnostics from this hospitalization (including imaging, microbiology, ancillary and laboratory) are listed below for reference.    Significant Diagnostic Studies: Ct Abdomen Pelvis W Contrast  03/07/2014   CLINICAL DATA:  Diffuse abdominal pain, fever, nausea and vomiting, blood in stool. White cell count 15.3. History of GERD, acute ischemic colitis, colonic inertia, and enlarged liver.  EXAM: CT ABDOMEN AND PELVIS WITH CONTRAST  TECHNIQUE: Multidetector CT imaging of the abdomen and pelvis was performed using the standard protocol following bolus administration of intravenous contrast.  CONTRAST:  111m OMNIPAQUE IOHEXOL 300 MG/ML SOLN, 584mOMNIPAQUE IOHEXOL 300 MG/ML SOLN  COMPARISON:  06/21/2013  FINDINGS: Lung bases are clear.  Calcified granulomas in the spleen. Focal low-attenuation change in the liver adjacent to the falciform ligament consistent with focal fatty infiltration. No other focal liver lesions identified. The gallbladder, pancreas, adrenal glands, kidneys, inferior vena cava, and retroperitoneal lymph nodes are unremarkable. Calcification of  the abdominal aorta without aneurysm. Stomach and small bowel are normal although under distention limits evaluation. Interval development of  diffuse wall thickening and edema in the cecum. This may represent focal colitis although developing cecal mass is not excluded. Ascending and proximal transverse colon are stool filled with transition zone to decompressed colon in the distal transverse colon and descending region. Obstructing lesion at the mid transverse colon is not excluded. Consider colonoscopy for further evaluation. No free air or free fluid in the abdomen.  Pelvis: Mild diffuse bladder wall thickening may indicate cystitis. Uterus and ovaries are not enlarged. No free or loculated pelvic fluid collections. Rectosigmoid colon is decompressed. No pelvic mass or lymphadenopathy. No destructive bone lesions. Normal alignment of the lumbar spine.  IMPRESSION: Prominent wall thickening and edema of the cecum probably indicating focal colitis but cecal mass not excluded. Stool-filled colon to the level of the mid transverse colon with decompression of the distal colon. Mass or stricture this level is not excluded and colonoscopy is suggested for further evaluation. Mild wall thickening in the bladder may indicate cystitis. Focal fatty infiltration in the liver.   Electronically Signed   By: Lucienne Capers M.D.   On: 03/07/2014 01:58   Dg Chest Port 1 View  03/06/2014   CLINICAL DATA:  Fever, shortness of breath and left-sided chest pain.  EXAM: PORTABLE CHEST - 1 VIEW  COMPARISON:  01/04/2014  FINDINGS: The cardiac silhouette, mediastinal and hilar contours are within normal limits and stable. There are severe chronic lung changes with emphysema and pulmonary scarring. Stable calcified granuloma in the left upper lobe. No definite acute pulmonary findings. No pleural effusion. The bony thorax is intact.  IMPRESSION: Chronic emphysematous changes and pulmonary scarring but no definite acute overlying  pulmonary process.   Electronically Signed   By: Kalman Jewels M.D.   On: 03/06/2014 22:16   Dg Abd Portable 1v  03/11/2014   CLINICAL DATA:  Abdominal pain  EXAM: PORTABLE ABDOMEN - 1 VIEW  COMPARISON:  CT abdomen pelvis - 03/07/2014  FINDINGS: Enteric contrast from recent performed abdominal CT is seen throughout the colon. There is no definitive gas distention of upstream loops of small bowel. No supine evidence of pneumoperitoneum. No definite pneumatosis or portal venous gas.  No definite abnormal intra-abdominal calcifications.  Mild scoliotic curvature of the thoracolumbar spine. No acute osseus abnormalities.  IMPRESSION: Enteric contrast from recently performed abdominal CT remains within the colon suggestive of ileus. No dilated loops of upstream small bowel to suggest obstruction.   Electronically Signed   By: Sandi Mariscal M.D.   On: 03/11/2014 15:10   Microbiology: Recent Results (from the past 240 hour(s))  Blood culture (routine x 2)     Status: None (Preliminary result)   Collection Time: 03/06/14 10:33 PM  Result Value Ref Range Status   Specimen Description BLOOD LEFT ARM  Final   Special Requests BOTTLES DRAWN AEROBIC AND ANAEROBIC 3CC  Final   Culture   Final           BLOOD CULTURE RECEIVED NO GROWTH TO DATE CULTURE WILL BE HELD FOR 5 DAYS BEFORE ISSUING A FINAL NEGATIVE REPORT Performed at [facility]    Report Status PENDING  Incomplete  Urine culture     Status: None   Collection Time: 03/07/14  1:01 AM  Result Value Ref Range Status   Specimen Description URINE, CLEAN CATCH  Final   Special Requests NONE  Final   Colony Count NO GROWTH Performed at [facility]   Final   Culture NO GROWTH Performed at [facility]  Final   Report Status 03/08/2014 FINAL  Final  MRSA PCR Screening     Status: None   Collection Time: 03/07/14  3:07 AM  Result Value Ref Range Status   MRSA by PCR NEGATIVE NEGATIVE Final    Comment:        The GeneXpert  MRSA Assay (FDA approved for NASAL specimens only), is one component of a comprehensive MRSA colonization surveillance program. It is not intended to diagnose MRSA infection nor to guide or monitor treatment for MRSA infections.   Blood culture (routine x 2)     Status: None (Preliminary result)   Collection Time: 03/07/14  3:15 AM  Result Value Ref Range Status   Specimen Description BLOOD RIGHT ANTECUBITAL  Final   Special Requests BOTTLES DRAWN AEROBIC ONLY 5ML  Final   Culture   Final           BLOOD CULTURE RECEIVED NO GROWTH TO DATE CULTURE WILL BE HELD FOR 5 DAYS BEFORE ISSUING A FINAL NEGATIVE REPORT Performed at [facility]    Report Status PENDING  Incomplete   Labs: Basic Metabolic Panel:  Recent Labs Lab 03/07/14 0311 03/08/14 0508 03/09/14 0001 03/09/14 0415 03/10/14 0332  NA 138 139 134* 134* 141  K 3.9 3.9 4.0 3.7 3.6  CL 102 101 97 99 104  CO2 _0 GLUCOSE 100* 98 99 91 83  BUN 7 <5* <5* <5* <5*  CREATININE 0.56 0.56 0.58 0.54 0.59  CALCIUM 8.6 8.8 8.9 8.6 8.3*  MG 1.9  --   --   --   --   PHOS 4.2  --   --   --   --    Liver Function Tests:  Recent Labs Lab 03/06/14 2146 03/07/14 0311 03/09/14 0001  AST _1 ALT _2 ALKPHOS 94 77 75  BILITOT 0.8 0.8 1.0  PROT 7.7 6.2 6.6  ALBUMIN 4.4 3.5 3.7    Recent Labs Lab 03/06/14 2146 03/09/14 0001  LIPASE 19 21   CBC:  Recent Labs Lab 03/06/14 2146  03/08/14 0508 03/09/14 0001 03/09/14 0415 03/10/14 0332 03/12/14 0643  WBC 15.3*  < > 8.9 10.9* 8.5 6.4 5.3  NEUTROABS 12.2*  --   --  7.9*  --   --   --   HGB 16.4*  < > 12.6 14.1 11.8* 11.4* 12.8  HCT 48.5*  < > 40.2 42.3 36.3 35.8* 39.0  MCV 90.5  < > 92.8 90.8 88.5 90.4 88.8  PLT 226  < > 199 163 171 184 225  < > = values in this interval not displayed. Cardiac Enzymes:  Recent Labs Lab 03/07/14 0315 03/07/14 0825 03/07/14 1435  TROPONINI <0.03 <0.03 <0.03   BNP: BNP (last 3  results)  Recent Labs  03/26/13 0401 03/30/13 0330 01/04/14 0042  PROBNP 1536.0* 136.4* 46.5   CBG:  Recent Labs Lab 03/11/14 2152  GLUCAP 94    Signed:  Tyreese Thain  [facility] 03/12/2014, 1:59 PM

## 2014-03-12 NOTE — Progress Notes (Signed)
D/c to home via wheelchair with family. D/c instructions given and demonstrated understanding.  VSs. Pain denied. Breathing regular and unlabored on room air.

## 2014-03-13 LAB — CULTURE, BLOOD (ROUTINE X 2)
Culture: NO GROWTH
Culture: NO GROWTH

## 2014-03-25 NOTE — Discharge Summary (Signed)
Physician Discharge Summary  BELEN PESCH FWY:[id number] DOB: 1960-10-17 DOA: 03/06/2014  PCP: Leonard Downing, MD  Admit date: 03/06/2014 Discharge date: 03/08/2014  Recommendations for Outpatient Follow-up:  1. Pt left the hospital, staff unable to locate pt   Discharge Diagnoses:    Discharge Condition: Unknown as pt left  Diet recommendation: Not discussed as pt left   History of present illness:   Brief narrative:  54 year old female with past medical history of COPD, chronic resp failure-no longer on continuous O2, tobacco abuse, VDRF in January 2015 secondary to metapneumovirus, bipolar depression, hx/o TIA, GERD who presented to [facility] ED with worsening abdominal pain in right upper quadrant and epigastric area which started the night prior to this admission and woke her up from sleep. Patient reported associated fevers at home, T max 101.3 F, nausea, non bloody vomiting. She did reports dark stool and some red blood in stool. Patient has had colonoscopy in 2011 by Dr. [name] and at that time it showed suspicion for colonic inertia. Flexible sigmoidoscopy was done by Dr. [name] in 10/2012 and showed non bleeding mucosal ulcerations in the descending colon and splenic flexure that looked like ischemic colitis; biopsies confirmed ischemic type mucosal injury.  On admission, BP was 84/41, HR 76 - 108, RR 12-25, T max 98.7 F and oxygen saturation 89-100% with [place] oxygen support. Blood work revealed WBC count of 15.3, hemoglobin 16.4, otehrwise unremarkable. CT abdomen showed prominent wall thickening and edema of the cecum probably indicating focal colitis but cecal mass not excluded; stool-filled colon to the level of the mid transverse colon with decompression of the distal colon; mass or stricture at this level is not excluded and colonoscopy is suggested for further evaluation; mild wall thickening in the bladder may indicate cystitis.   Assessment and Plan:    Principal  Problem: Abdominal pain / possible colitis - patient admitted with abdominal pain and CT abdomen suggestive of colitis. As noted patient has had flexible sigmoidoscopy was done by Dr. [name] in 10/2012 and showed non bleeding mucosal ulcerations in the descending colon and splenic flexure that looked like ischemic colitis; biopsies confirmed ischemic type mucosal injury. - patient started on zosyn - pt seen earlier in the day but later in the day staff unable to locate pt, pt appeared to have left AMA  Active Problems: Sepsis - sepsis secondary to possible colitis and questionable cystitis as seen on CT abdomen - sepsis criteria met on admission with hypotension, tachycardia, tachypnea, hypoxia. In addition, WBC count elevated at 15.3.  - WBC now WNL and pt afebrile  - UA did not show evidence of UTI. CXR did not reveal acute cardiopulmonary process   COPD (chronic obstructive pulmonary disease) / tobacco abuse  - respiratory status is stable in AM but pt left AMA in the afternoon, presumed stable   Possible GI bleed - pt has had complaints of dark stool and some red blood per rectum - FOBT (+)  Mild elevation in TSH  - TSH 5.225 - needs to be repeated outpt in about 4 weeks   LE edema, left - No evidence of DVT on Doppler studies   Code Status: Full Disposition Plan: PT left AMA   IV Access:    Peripheral IV  Procedures and diagnostic studies:    Ct Abdomen Pelvis W Contrast 03/07/2014 Prominent wall thickening and edema of the cecum probably indicating focal colitis but cecal mass not excluded. Stool-filled colon to the level of the mid transverse  colon with decompression of the distal colon. Mass or stricture this level is not excluded and colonoscopy is suggested for further evaluation. Mild wall thickening in the bladder may indicate cystitis. Focal fatty infiltration in the liver.  Dg Chest Port 1 View 03/06/2014 Chronic emphysematous changes and pulmonary  scarring but no definite acute overlying pulmonary process.  Medical Consultants:    GI  Other Consultants:    None  Anti-Infectives:    Zosyn 03/07/2014 -->  Discharge Exam: Filed Vitals:   03/08/14 1407  BP: 137/51  Pulse: 98  Temp: 97.5 F (36.4 C)  Resp: 18   Filed Vitals:   03/07/14 2245 03/08/14 0515 03/08/14 1407 03/08/14 1530  BP: 126/50 116/59 137/51   Pulse: 113 101 98   Temp: 99.8 F (37.7 C) 98.5 F (36.9 C) 97.5 F (36.4 C)   TempSrc:  Oral Oral   Resp: 18 18 18    Height:      Weight:      SpO2:  95% 85% 92%    No PE done prior to pt leaving but done earlier in the day  Discharge Instructions     Medication List    ASK your doctor about these medications        antiseptic oral rinse 0.05 % Liqd solution  Commonly known as:  CPC / CETYLPYRIDINIUM CHLORIDE 0.05%  7 mLs by Mouth Rinse route 2 times daily at 12 noon and 4 pm.     aspirin EC 81 MG tablet  Take 1 tablet (81 mg total) by mouth daily.     chlorhexidine 0.12 % solution  Commonly known as:  PERIDEX  15 mLs by Mouth Rinse route 2 (two) times daily.     cyclobenzaprine 5 MG tablet  Commonly known as:  FLEXERIL  Take 1 tablet (5 mg total) by mouth 3 (three) times daily as needed for muscle spasms.     dextromethorphan-guaiFENesin 30-600 MG per 12 hr tablet  Commonly known as:  MUCINEX DM  Take 1 tablet by mouth 2 (two) times daily as needed for cough.     diazepam 10 MG tablet  Commonly known as:  VALIUM  Take 1 tablet (10 mg total) by mouth every 12 (twelve) hours as needed for anxiety.     dicyclomine 20 MG tablet  Commonly known as:  BENTYL  Take 1 tablet (20 mg total) by mouth 3 (three) times daily before meals.     DSS 100 MG Caps  Take 100 mg by mouth daily as needed for mild constipation.     DULERA 100-5 MCG/ACT Aero  Generic drug:  mometasone-formoterol  inhale 2 puffs by mouth twice a day     fluticasone 50 MCG/ACT nasal spray  Commonly known as:   FLONASE  Place 2 sprays into both nostrils daily as needed for allergies or rhinitis.     lactose free nutrition Liqd  Take 237 mLs by mouth 2 (two) times daily between meals.     lidocaine 5 % ointment  Commonly known as:  XYLOCAINE  Apply 1 application topically 2 (two) times daily as needed for mild pain.     loratadine 10 MG tablet  Commonly known as:  CLARITIN  Take 10 mg by mouth every evening.     mirtazapine 15 MG tablet  Commonly known as:  REMERON  Take 1 tablet (15 mg total) by mouth at bedtime.     mometasone 50 MCG/ACT nasal spray  Commonly known as:  NASONEX  Place 2 sprays into the nose daily.     oxymetazoline 0.05 % nasal spray  Commonly known as:  AFRIN NASAL SPRAY  Place 1 spray into both nostrils 2 (two) times daily.     pantoprazole 40 MG tablet  Commonly known as:  PROTONIX  Take 1 tablet (40 mg total) by mouth daily at 6 (six) AM.     promethazine 25 MG tablet  Commonly known as:  PHENERGAN  Take 0.5-1 tablets (12.5-25 mg total) by mouth every 6 (six) hours as needed for nausea.     SPIRIVA HANDIHALER 18 MCG inhalation capsule  Generic drug:  tiotropium  inhale contents of 1 capsule by mouth once daily     VENTOLIN HFA 108 (90 BASE) MCG/ACT inhaler  Generic drug:  albuterol  Inhale 2 puffs into the lungs every 6 (six) hours as needed for wheezing or shortness of breath.     albuterol (2.5 MG/3ML) 0.083% nebulizer solution  Commonly known as:  PROVENTIL  Take 3 mLs (2.5 mg total) by nebulization every 6 (six) hours as needed for shortness of breath. For shortness of breath     Vitamin D-3 5000 UNITS Tabs  Take 5,000 Units by mouth daily.     VITAMIN E PO  Take 1 capsule by mouth daily.          The results of significant diagnostics from this hospitalization (including imaging, microbiology, ancillary and laboratory) are listed below for reference.     Microbiology: No results found for this or any previous visit (from the past 240  hour(s)).   Labs: Basic Metabolic Panel: No results for input(s): NA, K, CL, CO2, GLUCOSE, BUN, CREATININE, CALCIUM, MG, PHOS in the last 168 hours. Liver Function Tests: No results for input(s): AST, ALT, ALKPHOS, BILITOT, PROT, ALBUMIN in the last 168 hours. No results for input(s): LIPASE, AMYLASE in the last 168 hours. No results for input(s): AMMONIA in the last 168 hours. CBC: No results for input(s): WBC, NEUTROABS, HGB, HCT, MCV, PLT in the last 168 hours. Cardiac Enzymes: No results for input(s): CKTOTAL, CKMB, CKMBINDEX, TROPONINI in the last 168 hours. BNP: BNP (last 3 results)  Recent Labs  03/26/13 0401 03/30/13 0330 01/04/14 0042  PROBNP 1536.0* 136.4* 46.5   CBG: No results for input(s): GLUCAP in the last 168 hours.   SIGNED: Time coordinating discharge: Over 30 minutes  Faye Ramsay, MD  [facility] 03/25/2014, 7:33 PM Pager [phone]  If 7PM-7AM, please contact night-coverage [email or web address] Password [email or web address]

## 2014-04-24 ENCOUNTER — Telehealth: Payer: Self-pay | Admitting: *Deleted

## 2014-04-24 NOTE — Telephone Encounter (Signed)
-----   Message from Hart Carwin, MD sent at 04/23/2014  3:42 PM EST ----- Regarding: [name], this pt left hospital AMA on Jan 5,2016. Can you call to see if she is planning to come? THANX B

## 2014-04-24 NOTE — Telephone Encounter (Signed)
Spoke with patient and she plans to keep her OV tomorrow.

## 2014-04-24 NOTE — Telephone Encounter (Signed)
Left a message for patient to call back. 

## 2014-04-24 NOTE — Telephone Encounter (Signed)
Patient wants to be sure her PCP sent her referral over for visit tomorrow.

## 2014-04-25 ENCOUNTER — Other Ambulatory Visit (INDEPENDENT_AMBULATORY_CARE_PROVIDER_SITE_OTHER): Payer: Medicaid Other

## 2014-04-25 ENCOUNTER — Ambulatory Visit (INDEPENDENT_AMBULATORY_CARE_PROVIDER_SITE_OTHER): Payer: Medicaid Other | Admitting: Internal Medicine

## 2014-04-25 ENCOUNTER — Encounter: Payer: Self-pay | Admitting: Internal Medicine

## 2014-04-25 VITALS — BP 122/66 | HR 104 | Ht 65.35 in | Wt 127.0 lb

## 2014-04-25 DIAGNOSIS — K559 Vascular disorder of intestine, unspecified: Secondary | ICD-10-CM

## 2014-04-25 DIAGNOSIS — R109 Unspecified abdominal pain: Secondary | ICD-10-CM

## 2014-04-25 DIAGNOSIS — E860 Dehydration: Secondary | ICD-10-CM

## 2014-04-25 LAB — BASIC METABOLIC PANEL
BUN: 10 mg/dL (ref 6–23)
CALCIUM: 10.1 mg/dL (ref 8.4–10.5)
CO2: 32 meq/L (ref 19–32)
Chloride: 101 mEq/L (ref 96–112)
Creatinine, Ser: 0.79 mg/dL (ref 0.40–1.20)
GFR: 80.58 mL/min (ref >60.00)
Glucose, Bld: 102 mg/dL — ABNORMAL HIGH (ref 70–99)
Potassium: 4.3 mEq/L (ref 3.5–5.1)
Sodium: 139 mEq/L (ref 135–145)

## 2014-04-25 LAB — CBC WITH DIFFERENTIAL/PLATELET
Basophils Absolute: 0 10*3/uL (ref 0.0–0.1)
Basophils Relative: 0.3 % (ref 0.0–3.0)
Eosinophils Absolute: 0.1 10*3/uL (ref 0.0–0.7)
Eosinophils Relative: 0.6 % (ref 0.0–5.0)
HEMATOCRIT: 49.6 % — AB (ref 36.0–46.0)
Hemoglobin: 16.9 g/dL — ABNORMAL HIGH (ref 12.0–15.0)
LYMPHS ABS: 2.4 10*3/uL (ref 0.7–4.0)
Lymphocytes Relative: 21.4 % (ref 12.0–46.0)
MCHC: 34.1 g/dL (ref 30.0–36.0)
MCV: 88.3 fl (ref 78.0–100.0)
MONO ABS: 0.7 10*3/uL (ref 0.1–1.0)
MONOS PCT: 5.9 % (ref 3.0–12.0)
Neutro Abs: 8 10*3/uL — ABNORMAL HIGH (ref 1.4–7.7)
Neutrophils Relative %: 71.8 % (ref 43.0–77.0)
PLATELETS: 247 10*3/uL (ref 150.0–400.0)
RBC: 5.61 Mil/uL — ABNORMAL HIGH (ref 3.87–5.11)
RDW: 14.5 % (ref 11.5–15.5)
WBC: 11.2 10*3/uL — ABNORMAL HIGH (ref 4.0–10.5)

## 2014-04-25 MED ORDER — POLYETHYLENE GLYCOL 3350 17 GM/SCOOP PO POWD: ORAL | Status: DC

## 2014-04-25 MED ORDER — DICYCLOMINE HCL 10 MG PO CAPS: ORAL_CAPSULE | ORAL | Status: AC

## 2014-04-25 NOTE — Progress Notes (Signed)
[name] Sep 15, 1960 [id number]  Note: This dictation was prepared with Dragon digital system. Any transcriptional errors that result from this procedure are unintentional.   History of Present Illness: This is a 54 year old white female hospitalized from January 5 to January  11 at [facility]. CT scan showed diffuse thickening of the transverse colon c/w acute colitis... Cecal mass could not be ruled out. She has a history of ischemic colitis on flexible sigmoidoscopy in September 2014 by Dr Leone Payor.. I have seen her in 2011 for colonoscopy when she was found to have a redundant colon mildly, dilated consistent with the colonic inertia. She also has COPD and is on  home oxygen on when necessary basis. Her mother passed away last week and she has been distressed and having pain in lower abdomen. She denies diarrhea but had some blood in her stool several days ago. She has been essentially on full liquids and soft foods    Past Medical History  Diagnosis Date  . TIA (transient ischemic attack)   . GERD (gastroesophageal reflux disease)   . Hepatomegaly     hx  . Colonic inertia   . Lung abscess     "calcified over"; bilateral   . COPD (chronic obstructive pulmonary disease)   . Stroke 2009    TIA  . PTSD (post-traumatic stress disorder)   . Bipolar disorder   . Depression   . Chronic headache   . Seizures     in past  . Fibromyalgia   . Iron deficiency anemia   . Arthritis   . Acute ischemic colitis 11/01/2012  . Marijuana abuse 2014    positive screen in hospital  . TIA (transient ischemic attack)     Past Surgical History  Procedure Laterality Date  . 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 Cesarean section      x 2  . Esophagogastroduodenoscopy  12/23/2011    Procedure: ESOPHAGOGASTRODUODENOSCOPY (EGD);  Surgeon: Hart Carwin, MD;  Location: [facility];  Service: Endoscopy;  Laterality: N/A;  . Colonoscopy    . Colonoscopy N/A  11/01/2012    Procedure: COLONOSCOPY;  Surgeon: Iva Boop, MD;  Location: [facility];  Service: Endoscopy;  Laterality: N/A;    Allergies  Allergen Reactions  . Oxycodone-Acetaminophen Anaphylaxis, Itching and Nausea And Vomiting    Tylox (Pt states, "I can only take Demerol, Oxycontin, and Dilaudid")  . Codeine Nausea And Vomiting  . Hydrocodone Nausea And Vomiting  . Quinolones Hives and Other (See Comments)    Damaged nerves and tendons; If have to take antibiotic, pt could take    Family history and social history have been reviewed.  Review of Systems: Lower abdominal pain. Shortness of breath. Blood per rectum  The remainder of the 10 point ROS is negative except as outlined in the H&P  Physical Exam: General Appearance thin sick appearing, smells of smoking,, in no distress, appears  Dyspneic, ambulates with walker Eyes  Non icteric  HEENT  Non traumatic, normocephalic  Mouth No lesion, tongue papillated, no cheilosis Neck Supple without adenopathy, thyroid not enlarged, no carotid bruits, no JVD Lungs Clear to auscultation bilaterally COR Normal S1, normal S2, regular rhythm, no murmur, quiet precordium Abdomen soft tender in the lower abdomen in left lower quadrant and suprapubic area and right lower quadrant. Normoactive bowel sounds. No rebound. No distention. Rectal soft Hemoccult negative stool Extremities  No pedal edema  Skin No lesions Neurological Alert and oriented x 3 Psychological Normal mood and affect  Assessment and Plan:   54 year old white female who had a hospitalization for ischemic colitis. Etiology not clear possibly low-flow state or severe constipation. She had a prior history ischemic colitis 2 years ago.She has completed course of Flagyl. She describes rectal bleeding but my exam today does not confirml blood per rectum. She will continue a low-residue diet. Bentyl 10 mg up to 3 times a day for cramps. If the symptoms don't improve in next few  weeks  consider colonoscopy.    [name] 04/25/2014

## 2014-04-25 NOTE — Patient Instructions (Addendum)
Your physician has requested that you go to the basement for the following lab work before leaving today: BMP,CBC  We have sent medications to your pharmacy for you to pick up at your convenience.   Dr Windle Guard

## 2014-07-12 ENCOUNTER — Ambulatory Visit: Payer: Self-pay | Admitting: Internal Medicine

## 2014-07-14 ENCOUNTER — Emergency Department (HOSPITAL_COMMUNITY): Payer: Medicaid Other

## 2014-07-14 ENCOUNTER — Encounter (HOSPITAL_COMMUNITY): Payer: Self-pay | Admitting: Emergency Medicine

## 2014-07-14 ENCOUNTER — Emergency Department (HOSPITAL_COMMUNITY)
Admission: EM | Admit: 2014-07-14 | Discharge: 2014-07-14 | Disposition: A | Discharge status: Home / Self Care | Payer: Medicaid Other | Attending: Emergency Medicine | Admitting: Emergency Medicine

## 2014-07-14 DIAGNOSIS — Z9981 Dependence on supplemental oxygen: Secondary | ICD-10-CM | POA: Diagnosis not present

## 2014-07-14 DIAGNOSIS — G40909 Epilepsy, unspecified, not intractable, without status epilepticus: Secondary | ICD-10-CM | POA: Diagnosis not present

## 2014-07-14 DIAGNOSIS — J441 Chronic obstructive pulmonary disease with (acute) exacerbation: Secondary | ICD-10-CM | POA: Insufficient documentation

## 2014-07-14 DIAGNOSIS — Z7951 Long term (current) use of inhaled steroids: Secondary | ICD-10-CM | POA: Insufficient documentation

## 2014-07-14 DIAGNOSIS — M1990 Unspecified osteoarthritis, unspecified site: Secondary | ICD-10-CM | POA: Insufficient documentation

## 2014-07-14 DIAGNOSIS — M797 Fibromyalgia: Secondary | ICD-10-CM | POA: Diagnosis not present

## 2014-07-14 DIAGNOSIS — Z720 Tobacco use: Secondary | ICD-10-CM | POA: Diagnosis not present

## 2014-07-14 DIAGNOSIS — Z8673 Personal history of transient ischemic attack (TIA), and cerebral infarction without residual deficits: Secondary | ICD-10-CM | POA: Insufficient documentation

## 2014-07-14 DIAGNOSIS — M79662 Pain in left lower leg: Secondary | ICD-10-CM | POA: Diagnosis not present

## 2014-07-14 DIAGNOSIS — R0602 Shortness of breath: Secondary | ICD-10-CM | POA: Diagnosis present

## 2014-07-14 DIAGNOSIS — K219 Gastro-esophageal reflux disease without esophagitis: Secondary | ICD-10-CM | POA: Insufficient documentation

## 2014-07-14 DIAGNOSIS — Z862 Personal history of diseases of the blood and blood-forming organs and certain disorders involving the immune mechanism: Secondary | ICD-10-CM | POA: Insufficient documentation

## 2014-07-14 DIAGNOSIS — Z79899 Other long term (current) drug therapy: Secondary | ICD-10-CM | POA: Diagnosis not present

## 2014-07-14 DIAGNOSIS — F4310 Post-traumatic stress disorder, unspecified: Secondary | ICD-10-CM | POA: Insufficient documentation

## 2014-07-14 DIAGNOSIS — F329 Major depressive disorder, single episode, unspecified: Secondary | ICD-10-CM | POA: Diagnosis not present

## 2014-07-14 DIAGNOSIS — Z7982 Long term (current) use of aspirin: Secondary | ICD-10-CM | POA: Insufficient documentation

## 2014-07-14 LAB — BRAIN NATRIURETIC PEPTIDE: B Natriuretic Peptide: 16.5 pg/mL (ref 0.0–100.0)

## 2014-07-14 LAB — CBC
HCT: 50.6 % — ABNORMAL HIGH (ref 36.0–46.0)
Hemoglobin: 16.8 g/dL — ABNORMAL HIGH (ref 12.0–15.0)
MCH: 30.2 pg (ref 26.0–34.0)
MCHC: 33.2 g/dL (ref 30.0–36.0)
MCV: 91 fL (ref 78.0–100.0)
Platelets: 222 10*3/uL (ref 150–400)
RBC: 5.56 MIL/uL — ABNORMAL HIGH (ref 3.87–5.11)
RDW: 14.4 % (ref 11.5–15.5)
WBC: 9.4 10*3/uL (ref 4.0–10.5)

## 2014-07-14 LAB — BASIC METABOLIC PANEL
Anion gap: 12 (ref 5–15)
BUN: 11 mg/dL (ref 6–20)
CALCIUM: 10 mg/dL (ref 8.9–10.3)
CHLORIDE: 95 mmol/L — AB (ref 101–111)
CO2: 31 mmol/L (ref 22–32)
Creatinine, Ser: 0.6 mg/dL (ref 0.44–1.00)
GLUCOSE: 121 mg/dL — AB (ref 65–99)
Potassium: 4.2 mmol/L (ref 3.5–5.1)
Sodium: 138 mmol/L (ref 135–145)

## 2014-07-14 LAB — I-STAT TROPONIN, ED: Troponin i, poc: 0 ng/mL (ref 0.00–0.08)

## 2014-07-14 LAB — D-DIMER, QUANTITATIVE (NOT AT ARMC): D-Dimer, Quant: <0.27 ug/mL-FEU (ref 0.00–0.48)

## 2014-07-14 MED ORDER — IPRATROPIUM-ALBUTEROL 0.5-2.5 (3) MG/3ML IN SOLN
3.0000 mL | Freq: Once | RESPIRATORY_TRACT | Status: AC
  Administered 2014-07-14: 3 mL via RESPIRATORY_TRACT
  Filled 2014-07-14: qty 3

## 2014-07-14 MED ORDER — IBUPROFEN 200 MG PO TABS
400.0000 mg | ORAL_TABLET | Freq: Once | ORAL | Status: AC
  Administered 2014-07-14: 400 mg via ORAL
  Filled 2014-07-14: qty 2

## 2014-07-14 MED ORDER — ONDANSETRON HCL 4 MG/2ML IJ SOLN
4.0000 mg | Freq: Once | INTRAMUSCULAR | Status: AC
  Administered 2014-07-14: 4 mg via INTRAVENOUS
  Filled 2014-07-14: qty 2

## 2014-07-14 MED ORDER — PREDNISONE 10 MG (21) PO TBPK: ORAL_TABLET | ORAL | Status: DC

## 2014-07-14 MED ORDER — GUAIFENESIN ER 1200 MG PO TB12: 1.0000 | ORAL_TABLET | Freq: Two times a day (BID) | ORAL | Status: DC

## 2014-07-14 MED ORDER — PREDNISONE 20 MG PO TABS
60.0000 mg | ORAL_TABLET | Freq: Once | ORAL | Status: AC
  Administered 2014-07-14: 60 mg via ORAL
  Filled 2014-07-14: qty 3

## 2014-07-14 NOTE — Discharge Instructions (Signed)

## 2014-07-14 NOTE — ED Provider Notes (Signed)
CSN: [id number]     Arrival date & time 07/14/14  4098 History   First MD Initiated Contact with Patient 07/14/14 0720     Chief Complaint  Patient presents with  . Shortness of Breath   HPI Patient presents to the emergency room with complaints of shortness of breath and nasal congestion. Patient has a history of chronic lung disease that include COPD as well as pulmonary abscess.  Patient states since yesterday she started noticing increasing trouble with nasal congestion and shortness of breath. She normally uses oxygen 0.5 L nasal cannula intermittently but has been using it constantly since yesterday. She feels like her sinuses are congested. She can't get any oxygen through her nose. She has not had any fevers but feels like her lungs congested. She also has had some intermittent sharp left-sided chest pain. She noticed swelling in her legs primarily on the left side since yesterday as well. She denies any history of DVT or PE. Past Medical History  Diagnosis Date  . TIA (transient ischemic attack)   . GERD (gastroesophageal reflux disease)   . Hepatomegaly     hx  . Colonic inertia   . Lung abscess     "calcified over"; bilateral   . COPD (chronic obstructive pulmonary disease)   . Stroke 2009    TIA  . PTSD (post-traumatic stress disorder)   . Bipolar disorder   . Depression   . Chronic headache   . Seizures     in past  . Fibromyalgia   . Iron deficiency anemia   . Arthritis   . Acute ischemic colitis 11/01/2012  . Marijuana abuse 2014    positive screen in hospital  . TIA (transient ischemic attack)    Past Surgical History  Procedure Laterality Date  . 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 Cesarean section      x 2  . Esophagogastroduodenoscopy  12/23/2011    Procedure: ESOPHAGOGASTRODUODENOSCOPY (EGD);  Surgeon: Hart Carwin, MD;  Location: [facility];  Service: Endoscopy;  Laterality: N/A;  . Colonoscopy    . Colonoscopy N/A  11/01/2012    Procedure: COLONOSCOPY;  Surgeon: Iva Boop, MD;  Location: [facility];  Service: Endoscopy;  Laterality: N/A;   Family History  Problem Relation Age of Onset  . Emphysema Father   . Asthma Son   . Clotting disorder Mother   . Rheum arthritis Sister   . Rheum arthritis Brother   . Ovarian cancer Sister    History  Substance Use Topics  . Smoking status: Current Every Day Smoker -- 1.00 packs/day for 39 years    Types: Cigarettes  . Smokeless tobacco: Never Used     Comment: 3-4 daily11/11/14  . Alcohol Use: No   OB History    No data available     Review of Systems  All other systems reviewed and are negative.     Allergies  Oxycodone-acetaminophen; Codeine; Hydrocodone; and Quinolones  Home Medications   Prior to Admission medications   Medication Sig Start Date End Date Taking? Authorizing Provider  albuterol (PROVENTIL) (2.5 MG/3ML) 0.083% nebulizer solution Take 3 mLs (2.5 mg total) by nebulization every 6 (six) hours as needed for shortness of breath. For shortness of breath 12/20/13  Yes Alison Murray, MD  albuterol (VENTOLIN HFA) 108 (90 BASE) MCG/ACT inhaler Inhale 2 puffs into the lungs every 6 (six) hours as needed for wheezing  or shortness of breath.    Yes Historical Provider, MD  aspirin EC 81 MG tablet Take 1 tablet (81 mg total) by mouth daily. 10/14/12  Yes Costin Otelia Sergeant, MD  chlorhexidine (PERIDEX) 0.12 % solution 15 mLs by Mouth Rinse route 2 (two) times daily. 12/20/13  Yes Alison Murray, MD  Cholecalciferol (VITAMIN D-3) 5000 UNITS TABS Take 5,000 Units by mouth daily.   Yes Historical Provider, MD  diazepam (VALIUM) 10 MG tablet Take 1 tablet (10 mg total) by mouth every 12 (twelve) hours as needed for anxiety. 12/20/13  Yes Alison Murray, MD  diazepam (VALIUM) 5 MG tablet Take 5 mg by mouth every 8 (eight) hours. 07/13/14  Yes Historical Provider, MD  dicyclomine (BENTYL) 10 MG capsule Take 1 cap by mouth every 4-6 hrs PRN cramps  04/25/14  Yes Hart Carwin, MD  docusate sodium 100 MG CAPS Take 100 mg by mouth daily as needed for mild constipation. 12/20/13  Yes Alison Murray, MD  DULERA 100-5 MCG/ACT AERO inhale 2 puffs by mouth twice a day   Yes Barbaraann Share, MD  fluticasone [facility]) 50 MCG/ACT nasal spray Place 2 sprays into both nostrils daily as needed for allergies or rhinitis.   Yes Historical Provider, MD  lactose free nutrition (BOOST PLUS) LIQD Take 237 mLs by mouth 2 (two) times daily between meals. 04/03/13  Yes Nishant Dhungel, MD  mirtazapine (REMERON) 45 MG tablet Take 45 mg by mouth at bedtime. 03/28/14  Yes Historical Provider, MD  mometasone (NASONEX) 50 MCG/ACT nasal spray Place 2 sprays into the nose daily. 01/04/14  Yes Loren Racer, MD  omeprazole (PRILOSEC) 20 MG capsule Take 1 capsule by mouth 2 (two) times daily. 07/09/14  Yes Historical Provider, MD  oxyCODONE (ROXICODONE) 15 MG immediate release tablet Take 15 mg by mouth as needed for pain.  04/24/14  Yes Historical Provider, MD  promethazine (PHENERGAN) 25 MG tablet Take 0.5-1 tablets (12.5-25 mg total) by mouth every 6 (six) hours as needed for nausea. 12/20/13  Yes Alison Murray, MD  pseudoephedrine (SUDAFED) 30 MG tablet Take 30 mg by mouth every 4 (four) hours as needed for congestion.   Yes Historical Provider, MD  SPIRIVA HANDIHALER 18 MCG inhalation capsule inhale contents of 1 capsule by mouth once daily 12/01/12  Yes Barbaraann Share, MD  UNABLE TO FIND Pt takes Mucinex Expectorant, as needed   Yes Historical Provider, MD  VITAMIN E PO Take 1 capsule by mouth daily.   Yes Historical Provider, MD  antiseptic oral rinse (CPC / CETYLPYRIDINIUM CHLORIDE 0.05%) 0.05 % LIQD solution 7 mLs by Mouth Rinse route 2 times daily at 12 noon and 4 pm. Patient not taking: Reported on 07/14/2014 12/20/13   Alison Murray, MD  Guaifenesin 1200 MG TB12 Take 1 tablet (1,200 mg total) by mouth 2 (two) times daily at 10 AM and 5 PM. 07/14/14   Linwood Dibbles, MD   pantoprazole (PROTONIX) 40 MG tablet Take 1 tablet (40 mg total) by mouth daily at 6 (six) AM. Patient not taking: Reported on 07/14/2014 11/02/12   Dorothea Ogle, MD  polyethylene glycol powder [facility]) powder Take 1 capful daily with 8oz of water Patient not taking: Reported on 07/14/2014 04/25/14   Hart Carwin, MD  predniSONE (STERAPRED UNI-PAK 21 TAB) 10 MG (21) TBPK tablet Take 6 tabs by mouth daily  for 2 days, then 5 tabs for 2 days, then 4 tabs for 2 days, then  3 tabs for 2 days, 2 tabs for 2 days, then 1 tab by mouth daily for 2 days 07/14/14   Linwood Dibbles, MD   BP 123/63 mmHg  Pulse 97  Temp(Src) 98 F (36.7 C) (Oral)  Resp 21  SpO2 99%  LMP 05/29/2010 Physical Exam  Constitutional: No distress.  Thin  HENT:  Head: Normocephalic and atraumatic.  Right Ear: External ear normal.  Left Ear: External ear normal.  Mouth/Throat: No oropharyngeal exudate.  No purulent nasal discharge  Eyes: Conjunctivae are normal. Right eye exhibits no discharge. Left eye exhibits no discharge. No scleral icterus.  Neck: Neck supple. No tracheal deviation present.  Cardiovascular: Normal rate, regular rhythm and intact distal pulses.   Pulmonary/Chest: Effort normal. No stridor. No respiratory distress. She has no wheezes. She has no rales.  Diminished breath sounds bilaterally  Abdominal: Soft. Bowel sounds are normal. She exhibits no distension. There is no tenderness. There is no rebound and no guarding.  Musculoskeletal: She exhibits tenderness (mild tenderness left calf no erythema). She exhibits no edema.  Neurological: She is alert. She has normal strength. No cranial nerve deficit (no facial droop, extraocular movements intact, no slurred speech) or sensory deficit. She exhibits normal muscle tone. She displays no seizure activity. Coordination normal.  Skin: Skin is warm and dry. No rash noted.  Psychiatric: She has a normal mood and affect.  Nursing note and vitals  reviewed.   ED Course  Procedures (including critical care time) Labs Review Labs Reviewed  BASIC METABOLIC PANEL - Abnormal; Notable for the following:    Chloride 95 (*)    Glucose, Bld 121 (*)    All other components within normal limits  CBC - Abnormal; Notable for the following:    RBC 5.56 (*)    Hemoglobin 16.8 (*)    HCT 50.6 (*)    All other components within normal limits  BRAIN NATRIURETIC PEPTIDE  D-DIMER, QUANTITATIVE  I-STAT TROPOININ, ED    Imaging Review Dg Chest 2 View (if Patient Has Fever And/or Copd)  07/14/2014   CLINICAL DATA:  History of lung abscess. Current smoker. Shortness of breath since yesterday progressively worsening.  EXAM: CHEST  2 VIEW  COMPARISON:  03/06/2014  FINDINGS: Emphysematous changes in the lungs with scattered fibrosis. Calcified granuloma in the left upper lung. No focal airspace disease or consolidation in the lungs. No blunting of costophrenic angles. Normal heart size and pulmonary vascularity. Old deformity of the right proximal humerus.  IMPRESSION: Emphysematous changes and fibrosis in the lungs. No evidence of active pulmonary disease.   Electronically Signed   By: Burman Nieves M.D.   On: 07/14/2014 06:41     EKG Interpretation   Date/Time:  Saturday Jul 14 2014 06:12:36 EDT Ventricular Rate:  94 PR Interval:  147 QRS Duration: 80 QT Interval:  359 QTC Calculation: 449 R Axis:   80 Text Interpretation:  Sinus rhythm Right atrial enlargement Nonspecific T  abnormalities, lateral leads Confirmed by [facility]  MD, APRIL ([id number])  on 07/14/2014 6:15:19 AM      MDM   Final diagnoses:  COPD exacerbation    Patient's chest x-ray does not show evidence of pneumonia. Laboratory tests are unremarkable. I did not appreciate any left leg swelling or erythema however considering her complaints of leg swelling and chest pain I ordered a d dimer.  D dimer is negative.  I doubt PE.  Sx most likely related to a viral  infection and copd exacerbation.  She  has a lot of nasal congestion symptoms as well but i doubt bacterial sinusitis.  Will start her on course of oral steroids.  She may need to use her oxygen more regularly for the next few days.  Follow up with her pulmonary doctor/pulm doctor    Linwood Dibbles, MD 07/14/14 [phone]

## 2014-07-14 NOTE — ED Notes (Signed)
Pt from home c/o shortness  of breath since yesterday that has progressively gotten worse. Pt uses normally 0.5 L [place] O2 but has been using 2 L [place]. Pt is short of breath having to stop during sentences. She reports yellow green expectorant when coughinf. She also reports bilateral leg swelling.

## 2014-07-27 ENCOUNTER — Ambulatory Visit (INDEPENDENT_AMBULATORY_CARE_PROVIDER_SITE_OTHER): Payer: Medicaid Other | Admitting: Pulmonary Disease

## 2014-07-27 ENCOUNTER — Encounter: Payer: Self-pay | Admitting: Pulmonary Disease

## 2014-07-27 VITALS — BP 126/74 | HR 95 | Ht 66.5 in | Wt 125.0 lb

## 2014-07-27 DIAGNOSIS — J438 Other emphysema: Secondary | ICD-10-CM

## 2014-07-27 DIAGNOSIS — Z23 Encounter for immunization: Secondary | ICD-10-CM

## 2014-07-27 MED ORDER — IPRATROPIUM-ALBUTEROL 0.5-2.5 (3) MG/3ML IN SOLN: 3.0000 mL | Freq: Four times a day (QID) | RESPIRATORY_TRACT | Status: DC | PRN

## 2014-07-27 MED ORDER — BUDESONIDE 0.5 MG/2ML IN SUSP: 0.5000 mg | Freq: Two times a day (BID) | RESPIRATORY_TRACT | Status: DC

## 2014-07-27 NOTE — Patient Instructions (Signed)
Stop dulera and spiriva Take duoneb in your neb machine 4 times a day no matter what, and can take 2 additional treatments if needed on bad days. Add budesonide 0.5mg  to 2 treatments a day everyday STOP smoking.  This is the only way you will stay out of ER and hospital. followup with Dr. Marchelle Gearing in 57mos.

## 2014-07-27 NOTE — Addendum Note (Signed)
Addended by: Velvet Bathe on: 07/27/2014 04:06 PM   Modules accepted: Orders

## 2014-07-27 NOTE — Progress Notes (Signed)
   Subjective:    Patient ID: [name], female    DOB: 1961-01-12, 54 y.o.   MRN: [id number]  HPI The patient comes in today for follow-up of her known severe COPD. I have not seen her in almost 2 years, and unfortunately she continues to smoke and have recurrent acute exacerbations. She comes in today where she feels that her breathing has been getting worse, despite staying on dulera and Spiriva. She has oxygen to use while asleep and also with exertion, but is not totally compliant with it during the day. She currently has no significant cough, chest congestion, or purulence, but is having issues with exertional tolerance. She tells me that she is having difficulty getting her inhaled medications "down into my lungs".   Review of Systems  Constitutional: Negative for fever and unexpected weight change.  HENT: Negative for congestion, dental problem, ear pain, nosebleeds, postnasal drip, rhinorrhea, sinus pressure, sneezing, sore throat and trouble swallowing.   Eyes: Negative for redness and itching.  Respiratory: Positive for cough, chest tightness and shortness of breath. Negative for wheezing.   Cardiovascular: Negative for palpitations and leg swelling.  Gastrointestinal: Negative for nausea and vomiting.  Genitourinary: Negative for dysuria.  Musculoskeletal: Negative for joint swelling.  Skin: Negative for rash.  Neurological: Negative for headaches.  Hematological: Does not bruise/bleed easily.  Psychiatric/Behavioral: Negative for dysphoric mood. The patient is not nervous/anxious.        Objective:   Physical Exam Thin female in no acute distress Nose without purulence or discharge noted Neck without lymphadenopathy or thyromegaly Chest with very distant breath sounds, no active wheezing Cardiac exam with distant heart sounds, but regular Lower extremities without significant edema, no cyanosis Alert and oriented, moves all 4 extremities.       Assessment & Plan:

## 2014-07-27 NOTE — Assessment & Plan Note (Signed)
The patient is having worsening dyspnea on exertion, along with increased frequency of acute exacerbations despite being on a good bronchodilator regimen. I have explained to her that she will never stay well or stay out of the hospital if she continues to smoke. He feels that her medications are not deposit adequately, and we'll therefore change her over to nebulized broncho-dilators.

## 2014-07-27 NOTE — Addendum Note (Signed)
Addended by: Velvet Bathe on: 07/27/2014 03:29 PM   Modules accepted: Orders

## 2014-10-08 ENCOUNTER — Other Ambulatory Visit: Payer: Self-pay | Admitting: Family Medicine

## 2014-10-08 DIAGNOSIS — Z1231 Encounter for screening mammogram for malignant neoplasm of breast: Secondary | ICD-10-CM

## 2014-10-08 DIAGNOSIS — E2839 Other primary ovarian failure: Secondary | ICD-10-CM

## 2014-10-17 ENCOUNTER — Ambulatory Visit
Admission: RE | Admit: 2014-10-17 | Discharge: 2014-10-17 | Disposition: A | Discharge status: Home / Self Care | Payer: Medicaid Other | Source: Ambulatory Visit | Attending: Family Medicine | Admitting: Family Medicine

## 2014-10-17 DIAGNOSIS — Z1231 Encounter for screening mammogram for malignant neoplasm of breast: Secondary | ICD-10-CM

## 2014-10-17 DIAGNOSIS — E2839 Other primary ovarian failure: Secondary | ICD-10-CM

## 2014-11-21 ENCOUNTER — Encounter: Payer: Self-pay | Admitting: Internal Medicine

## 2014-12-03 ENCOUNTER — Other Ambulatory Visit: Payer: Self-pay

## 2014-12-03 DIAGNOSIS — Z1231 Encounter for screening mammogram for malignant neoplasm of breast: Secondary | ICD-10-CM

## 2014-12-04 ENCOUNTER — Ambulatory Visit (INDEPENDENT_AMBULATORY_CARE_PROVIDER_SITE_OTHER): Payer: Medicaid Other | Admitting: Internal Medicine

## 2014-12-04 ENCOUNTER — Encounter: Payer: Self-pay | Admitting: Internal Medicine

## 2014-12-04 VITALS — BP 142/70 | HR 75 | Ht 66.5 in | Wt 129.0 lb

## 2014-12-04 DIAGNOSIS — J438 Other emphysema: Secondary | ICD-10-CM | POA: Diagnosis not present

## 2014-12-04 NOTE — Patient Instructions (Signed)
ICD-9-CM ICD-10-CM   1. Other emphysema (HCC) 492.8 J43.8     Agree need to restage your emphysema Do Full PFT Respect decision to refuse flu shot 12/04/2014 due to recent fever For now continue your nebs/inhalers as before  Followup  - after PFT to see my NP Tammy for med calendar and reasssesment of copd meds

## 2014-12-04 NOTE — Progress Notes (Signed)
Subjective:     Patient ID: [name], female   DOB: 21-Sep-1960, 54 y.o.   MRN: [id number]  HPI  OV 12/04/2014  Chief Complaint  Patient presents with  . Follow-up    Pt former [facility] pt. Pt c/o pain under left breast. Pt c/o increase in SOB with activity. Pt denies cough.     54 year old female with MM alpha 1 phenotype. Reported severe COPD on oxygen. However I do not see any pulmonary function test in the chart. She presents for routine follow-up. She is to be followed by Dr. Jonathon Bellows in the past but now is a transfer of care after he left the practice. She says that her COPD is on stage. She says that her oxygen needs are unspecified. She also says that her inhalers are being changed to many times and she has no idea what she needs to be on. In addition she's had side effects to some inhalers. She is a rambling historian and it is unclear to me what she is trying to say. She also seems focused on some recent admission for ischemic colitis that has made her more deconditioned. She's been using a walker for the last 8 months to one year because of neuromuscular issues. Nevertheless overall his COPD stable.  Walking desaturation test using a walker but 185 feet 3 laps on room air: At one lap she desaturated below 88%. On 2 L she walk 2 laps and maintained a saturation    CAT COPD Symptom & Quality of Life Score (GSK trademark) 0 is no burden. 5 is highest burden 12/04/2014   Never Cough -> Cough all the time 1  No phlegm in chest -> Chest is full of phlegm 3  No chest tightness -> Chest feels very tight 3  No dyspnea for 1 flight stairs/hill -> Very dyspneic for 1 flight of stairs 5  No limitations for ADL at home -> Very limited with ADL at home 5  Confident leaving home -> Not at all confident leaving home 5  Sleep soundly -> Do not sleep soundly because of lung condition 3  Lots of Energy -> No energy at all 3  TOTAL Score (max 40)  28        Current outpatient prescriptions:  .   albuterol (PROVENTIL) (2.5 MG/3ML) 0.083% nebulizer solution, Take 3 mLs (2.5 mg total) by nebulization every 6 (six) hours as needed for shortness of breath. For shortness of breath, Disp: 75 mL, Rfl: 1 .  albuterol (VENTOLIN HFA) 108 (90 BASE) MCG/ACT inhaler, Inhale 2 puffs into the lungs every 6 (six) hours as needed for wheezing or shortness of breath. , Disp: , Rfl:  .  aspirin EC 81 MG tablet, Take 1 tablet (81 mg total) by mouth daily., Disp: 30 tablet, Rfl: 2 .  Cholecalciferol (VITAMIN D-3) 5000 UNITS TABS, Take 3,000 Units by mouth daily. , Disp: , Rfl:  .  diazepam (VALIUM) 5 MG tablet, Take 2 mg by mouth every 8 (eight) hours. , Disp: , Rfl: 0 .  dicyclomine (BENTYL) 10 MG capsule, Take 1 cap by mouth every 4-6 hrs PRN cramps, Disp: 60 capsule, Rfl: 0 .  docusate sodium 100 MG CAPS, Take 100 mg by mouth daily as needed for mild constipation., Disp: 10 capsule, Rfl: 0 .  DULERA 100-5 MCG/ACT AERO, inhale 2 puffs by mouth twice a day, Disp: 13 g, Rfl: 5 .  Guaifenesin 1200 MG TB12, Take 1 tablet (1,200 mg total) by mouth 2 (  two) times daily at 10 AM and 5 PM., Disp: 14 each, Rfl: 0 .  lactose free nutrition (BOOST PLUS) LIQD, Take 237 mLs by mouth 2 (two) times daily between meals., Disp: 60 Can, Rfl: 0 .  mirtazapine (REMERON) 45 MG tablet, Take 45 mg by mouth at bedtime., Disp: , Rfl: 0 .  mometasone (NASONEX) 50 MCG/ACT nasal spray, Place 2 sprays into the nose daily., Disp: 17 g, Rfl: 12 .  omeprazole (PRILOSEC) 20 MG capsule, Take 1 capsule by mouth 2 (two) times daily., Disp: , Rfl: 0 .  oxyCODONE (ROXICODONE) 15 MG immediate release tablet, Take 15 mg by mouth as needed for pain. , Disp: , Rfl: 0 .  pantoprazole (PROTONIX) 40 MG tablet, Take 1 tablet (40 mg total) by mouth daily at 6 (six) AM., Disp: 30 tablet, Rfl: 1 .  polyethylene glycol powder (GLYCOLAX/MIRALAX) powder, Take 1 capful daily with 8oz of water, Disp: 255 g, Rfl: 2 .  promethazine (PHENERGAN) 25 MG tablet, Take  0.5-1 tablets (12.5-25 mg total) by mouth every 6 (six) hours as needed for nausea., Disp: 30 tablet, Rfl: 0 .  VITAMIN E PO, Take 1 capsule by mouth daily., Disp: , Rfl:  .  budesonide (PULMICORT) 0.5 MG/2ML nebulizer solution, Take 2 mLs (0.5 mg total) by nebulization 2 (two) times daily. (Patient not taking: Reported on 12/04/2014), Disp: 60 mL, Rfl: 11 .  ipratropium-albuterol (DUONEB) 0.5-2.5 (3) MG/3ML SOLN, Take 3 mLs by nebulization 4 (four) times daily as needed. (Patient not taking: Reported on 12/04/2014), Disp: 360 mL, Rfl: 11 .  SPIRIVA HANDIHALER 18 MCG inhalation capsule, inhale contents of 1 capsule by mouth once daily (Patient not taking: Reported on 12/04/2014), Disp: 30 capsule, Rfl: 6  Immunization History  Administered Date(s) Administered  . Influenza Split 12/01/2011  . Influenza Whole 01/11/2009, 11/28/2009, 11/01/2010, 12/26/2013  . Influenza,inj,Quad PF,36+ Mos 11/22/2012  . Pneumococcal Conjugate-13 07/27/2014     Review of Systems  Positive for fatigue, gait disturbance, neuromuscular weakness that is generalized, shortness of breath, chronic pain but otherwise 11 point review of system is negative. Also positive for anxiety     Objective:   Physical Exam  Constitutional: She is oriented to person, place, and time. She appears well-developed and well-nourished. No distress.  Body mass index is 20.51 kg/(m^2).   HENT:  Head: Normocephalic and atraumatic.  Right Ear: External ear normal.  Left Ear: External ear normal.  Mouth/Throat: Oropharynx is clear and moist. No oropharyngeal exudate.  Eyes: Conjunctivae and EOM are normal. Pupils are equal, round, and reactive to light. Right eye exhibits no discharge. Left eye exhibits no discharge. No scleral icterus.  Neck: Normal range of motion. Neck supple. No JVD present. No tracheal deviation present. No thyromegaly present.  Cardiovascular: Normal rate, regular rhythm, normal heart sounds and intact distal pulses.   Exam reveals no gallop and no friction rub.   No murmur heard. Pulmonary/Chest: Effort normal and breath sounds normal. No respiratory distress. She has no wheezes. She has no rales. She exhibits no tenderness.  Barrel chest but no wheeze  Abdominal: Soft. Bowel sounds are normal. She exhibits no distension and no mass. There is no tenderness. There is no rebound and no guarding.  Musculoskeletal: Normal range of motion. She exhibits no edema or tenderness.  Using a walker  Lymphadenopathy:    She has no cervical adenopathy.  Neurological: She is alert and oriented to person, place, and time. She has normal reflexes. No cranial nerve deficit.  She exhibits normal muscle tone. Coordination normal.  Skin: Skin is warm and dry. No rash noted. She is not diaphoretic. No erythema. No pallor.  Psychiatric:  Rambling historian  Vitals reviewed.  Filed Vitals:   12/04/14 1542  BP: 142/70  Pulse: 75  Height: 5' 6.5" (1.689 m)  Weight: 129 lb (58.514 kg)  SpO2: 98%        Assessment:       ICD-9-CM ICD-10-CM   1. Other emphysema (HCC) 492.8 J43.8 Pulmonary Function Test       Plan:      Stable disease  PLAN   Agree need to restage your emphysema Do Full PFT Respect decision to refuse flu shot 12/04/2014 due to recent fever For now continue your nebs/inhalers as before  Followup  - after PFT to see my NP Tammy for med calendar and reasssesment of copd meds

## 2014-12-05 ENCOUNTER — Encounter (INDEPENDENT_AMBULATORY_CARE_PROVIDER_SITE_OTHER): Payer: Medicaid Other

## 2014-12-05 ENCOUNTER — Ambulatory Visit (INDEPENDENT_AMBULATORY_CARE_PROVIDER_SITE_OTHER): Payer: Medicaid Other | Admitting: Cardiovascular Disease

## 2014-12-05 ENCOUNTER — Encounter: Payer: Self-pay | Admitting: Cardiovascular Disease

## 2014-12-05 VITALS — BP 142/72 | HR 79 | Ht 66.5 in | Wt 128.0 lb

## 2014-12-05 DIAGNOSIS — R600 Localized edema: Secondary | ICD-10-CM

## 2014-12-05 DIAGNOSIS — R002 Palpitations: Secondary | ICD-10-CM

## 2014-12-05 MED ORDER — HYDROCHLOROTHIAZIDE 12.5 MG PO CAPS: 12.5000 mg | ORAL_CAPSULE | Freq: Every day | ORAL | Status: DC

## 2014-12-05 NOTE — Patient Instructions (Signed)
Your physician has requested that you have an echocardiogram at [facility] ([facility] Echocardiography is a painless test that uses sound waves to create images of your heart. It provides your doctor with information about the size and shape of your heart and how well your heart's chambers and valves are working. This procedure takes approximately one hour. There are no restrictions for this procedure.   Your physician has recommended that you wear a holter monitor 48  HOURS  Holter monitors are medical devices that record the heart's electrical activity. Doctors most often use these monitors to diagnose arrhythmias. Arrhythmias are problems with the speed or rhythm of the heartbeat. The monitor is a small, portable device. You can wear one while you do your normal daily activities. This is usually used to diagnose what is causing palpitations/syncope (passing out).  Your physician has requested that you have a lexiscan myoview. For further information please visit [email or web address] Please follow instruction sheet, as given.  LABS IN 1 WEEK -- BMP,TSH,CBC  START HCTZ 12.5 MG ONE TABLET DAILY.  Your physician wants you to follow-up as needed.

## 2014-12-05 NOTE — Progress Notes (Signed)
Cardiology Office Note   Date:  12/05/2014   ID:  [name], DOB 04-08-1960, MRN [id number]  PCP:  Kaleen Mask, MD  Cardiologist:   Madilyn Hook, MD   Chief Complaint  Patient presents with  . Appointment    swelling legs and ankles, has had a lot of migraines for the past 2 yrs that can last up to 18 hrs at a time.   . Shortness of Breath    O2 stat 92% walking to room on 1 liter of oxygen, up to 2 liters when very activie. after 2 mins of sitting on 1 liter of oxygen up to 96%      History of Present Illness: [name] is a 54 y.o. female with COPD on home O2 and tobacco abuse who presents for an evaluation of multiple complaints.  [name] reports a vibrating sensation between her shoulder blades that occurs daily.  It typically occurs when [name] is walking and is associated with both shortness of breath and lightheadedness.  The episodes last for minutes at a time.  [name] also report a sensation of heart racing that occurs separately from the vibrating.  [name] denies syncope.  [name] also reports LE edema that has been ongoing for the last 2-3 months.  [name] denies orthopnea, PND or weight gain.  Her shortness of breath is as baseline.  [name] previously saw Dr. Alanda Amass for inappropriate tachycardia that was treated with cardizem, which made her hypotensive.  [name] has not been followed by a cardiologist in several years and does not have any cardiac diagnoses.  [name] has chronic shortness of breath due to pulmonary disease and has been admitted to the hospital for pneumonia multiple times over the last 2-3 years.  [name] also reports intermittent left flank pain that is not exertional.  [name] thinks this may be due to colitis.  [name] denies fever, chills, diarrhea or hematochezia.   Past Medical History  Diagnosis Date  . TIA (transient ischemic attack)   . GERD (gastroesophageal reflux disease)   . Hepatomegaly     hx  . Colonic inertia   . Lung abscess (HCC)    "calcified over"; bilateral   . COPD (chronic obstructive pulmonary disease) (HCC)   . Stroke [facility]) 2009    TIA  . PTSD (post-traumatic stress disorder)   . Bipolar disorder (HCC)   . Depression   . Chronic headache   . Seizures (HCC)     in past  . Fibromyalgia   . Iron deficiency anemia   . Arthritis   . Acute ischemic colitis (HCC) 11/01/2012  . Marijuana abuse 2014    positive screen in hospital  . TIA (transient ischemic attack)     Past Surgical History  Procedure Laterality Date  . 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 Cesarean section      x 2  . Esophagogastroduodenoscopy  12/23/2011    Procedure: ESOPHAGOGASTRODUODENOSCOPY (EGD);  Surgeon: Hart Carwin, MD;  Location: [facility];  Service: Endoscopy;  Laterality: N/A;  . Colonoscopy    . Colonoscopy N/A 11/01/2012    Procedure: COLONOSCOPY;  Surgeon: Iva Boop, MD;  Location: [facility] ENDOSCOPY;  Service: Endoscopy;  Laterality: N/A;     Current Outpatient Prescriptions  Medication Sig Dispense Refill  . albuterol (PROVENTIL) (2.5 MG/3ML) 0.083% nebulizer solution Take 3 mLs (2.5 mg total) by nebulization every 6 (six)  hours as needed for shortness of breath. For shortness of breath 75 mL 1  . albuterol (VENTOLIN HFA) 108 (90 BASE) MCG/ACT inhaler Inhale 2 puffs into the lungs every 6 (six) hours as needed for wheezing or shortness of breath.     Marland Kitchen aspirin EC 81 MG tablet Take 1 tablet (81 mg total) by mouth daily. 30 tablet 2  . budesonide (PULMICORT) 0.5 MG/2ML nebulizer solution Take 2 mLs (0.5 mg total) by nebulization 2 (two) times daily. 60 mL 11  . Cholecalciferol (VITAMIN D-3) 5000 UNITS TABS Take 3,000 Units by mouth daily.     . diazepam (VALIUM) 5 MG tablet Take 2 mg by mouth every 8 (eight) hours.   0  . dicyclomine (BENTYL) 10 MG capsule Take 1 cap by mouth every 4-6 hrs PRN cramps 60 capsule 0  . docusate sodium 100 MG CAPS Take 100 mg by mouth daily as needed for  mild constipation. 10 capsule 0  . DULERA 100-5 MCG/ACT AERO inhale 2 puffs by mouth twice a day 13 g 5  . Guaifenesin 1200 MG TB12 Take 1 tablet (1,200 mg total) by mouth 2 (two) times daily at 10 AM and 5 PM. 14 each 0  . ipratropium-albuterol (DUONEB) 0.5-2.5 (3) MG/3ML SOLN Take 3 mLs by nebulization 4 (four) times daily as needed. 360 mL 11  . lactose free nutrition (BOOST PLUS) LIQD Take 237 mLs by mouth 2 (two) times daily between meals. 60 Can 0  . mirtazapine (REMERON) 45 MG tablet Take 45 mg by mouth at bedtime.  0  . mometasone (NASONEX) 50 MCG/ACT nasal spray Place 2 sprays into the nose daily. 17 g 12  . omeprazole (PRILOSEC) 20 MG capsule Take 1 capsule by mouth 2 (two) times daily.  0  . oxyCODONE (ROXICODONE) 15 MG immediate release tablet Take 15 mg by mouth as needed for pain.   0  . pantoprazole (PROTONIX) 40 MG tablet Take 1 tablet (40 mg total) by mouth daily at 6 (six) AM. 30 tablet 1  . polyethylene glycol powder (GLYCOLAX/MIRALAX) powder Take 1 capful daily with 8oz of water 255 g 2  . promethazine (PHENERGAN) 25 MG tablet Take 0.5-1 tablets (12.5-25 mg total) by mouth every 6 (six) hours as needed for nausea. 30 tablet 0  . SPIRIVA HANDIHALER 18 MCG inhalation capsule inhale contents of 1 capsule by mouth once daily 30 capsule 6  . VITAMIN E PO Take 1 capsule by mouth daily.     No current facility-administered medications for this visit.    Allergies:   Oxycodone-acetaminophen; Codeine; Hydrocodone; and Quinolones    Social History:  The patient  reports that [name] quit smoking about 10 months ago. Her smoking use included Cigarettes. [name] has a 39 pack-year smoking history. [name] has never used smokeless tobacco. [name] reports that [name] does not drink alcohol or use illicit drugs.   Family History:  The patient's family history includes Asthma in her son; Clotting disorder in her mother; Emphysema in her father; Ovarian cancer in her sister; Rheum arthritis in her brother  and sister.    ROS:  Please see the history of present illness.   Otherwise, review of systems are positive for none.   All other systems are reviewed and negative.    PHYSICAL EXAM: VS:  BP 142/72 mmHg  Pulse 79  Ht 5' 6.5" (1.689 m)  Wt 58.06 kg (128 lb)  BMI 20.35 kg/m2  LMP 05/29/2010 , BMI Body mass index is 20.35  kg/(m^2). GENERAL:  Well appearing HEENT:  Pupils equal round and reactive, fundi not visualized, oral mucosa unremarkable NECK:  No jugular venous distention, waveform within normal limits, carotid upstroke brisk and symmetric, no bruits, no thyromegaly LYMPHATICS:  No cervical adenopathy LUNGS:  Clear to auscultation bilaterally HEART:  RRR.  PMI not displaced or sustained,S1 and S2 within normal limits, no S3, no S4, no clicks, no rubs, no murmurs ABD:  Flat, positive bowel sounds normal in frequency in pitch, no bruits, no rebound, no guarding, no midline pulsatile mass, no hepatomegaly, no splenomegaly EXT:  2 plus pulses throughout, trace edema, no cyanosis no clubbing SKIN:  No rashes no nodules NEURO:  Cranial nerves II through XII grossly intact, motor grossly intact throughout PSYCH:  Cognitively intact, oriented to person place and time    EKG:  EKG is ordered today. The ekg ordered today demonstrates sinus rhythm at 79 bpm.     Recent Labs: 01/04/2014: Pro B Natriuretic peptide (BNP) 46.5 03/07/2014: Magnesium 1.9; TSH 5.225* 03/09/2014: ALT 14 07/14/2014: B Natriuretic Peptide 16.5; BUN 11; Creatinine, Ser 0.60; Hemoglobin 16.8*; Platelets 222; Potassium 4.2; Sodium 138    Lipid Panel    Component Value Date/Time   CHOL 166 10/14/2012 0500   TRIG 113 10/14/2012 0500   HDL 58 10/14/2012 0500   CHOLHDL 2.9 10/14/2012 0500   VLDL 23 10/14/2012 0500   LDLCALC 85 10/14/2012 0500      Wt Readings from Last 3 Encounters:  12/05/14 58.06 kg (128 lb)  12/04/14 58.514 kg (129 lb)  07/27/14 56.7 kg (125 lb)      Other studies Reviewed: Additional  studies/ records that were reviewed today include:  Review of the above records demonstrates:  Please see elsewhere in the note.     ASSESSMENT AND PLAN:  # Chest pain: Symptoms are very atypical.  Will get a Lexiscan Cardiolite to evaluate for ischemic heart disease.  # LE edema: Very mild on exam and neck veins are not elevated.  [name] is not frankly volume overloaded.  I am concerned with starting lasix, as this could lead to intravascular volume depletion.  Will start HCTZ 12.5mg  with a dual purpose of blood pressure control and will help with her mild edema.  Will also obtain an echo to evaluate for structural heart disease.  # Hypertension: BP mildly elevated.  Will start HCTZ 12.5 mg daily as above.  BMP in one week to monitor creatinine and potassium.  # Palpitations: Her vibrating sensation between the shoulders does not sound cardiac.  However, [name] also reports palpitations, lightheadedness and shortness of breath.  Will obtain a BMP, magnesium, CBC, and thyroid fuction in a week.      Current medicines are reviewed at length with the patient today.  The patient does not have concerns regarding medicines.  The following changes have been made:  Start HCTZ 12.5mg  daily.  Labs/ tests ordered today include:  No orders of the defined types were placed in this encounter.     Disposition:   FU with Lissandro Dilorenzo C. Duke Salvia, MD as needed based on the above testing.    Signed, Madilyn Hook, MD  12/05/2014 4:40 PM    [facility]

## 2014-12-12 ENCOUNTER — Telehealth (HOSPITAL_COMMUNITY): Payer: Self-pay | Admitting: *Deleted

## 2014-12-13 ENCOUNTER — Telehealth: Payer: Self-pay | Admitting: Cardiovascular Disease

## 2014-12-13 ENCOUNTER — Other Ambulatory Visit: Payer: Self-pay

## 2014-12-13 ENCOUNTER — Other Ambulatory Visit: Payer: Self-pay | Admitting: Cardiovascular Disease

## 2014-12-13 LAB — CBC
HEMATOCRIT: 42.6 % (ref 36.0–46.0)
HEMOGLOBIN: 14.2 g/dL (ref 12.0–15.0)
MCH: 29.2 pg (ref 26.0–34.0)
MCHC: 33.3 g/dL (ref 30.0–36.0)
MCV: 87.7 fL (ref 78.0–100.0)
MPV: 10 fL (ref 8.6–12.4)
Platelets: 262 10*3/uL (ref 150–400)
RBC: 4.86 MIL/uL (ref 3.87–5.11)
RDW: 13.5 % (ref 11.5–15.5)
WBC: 6.6 10*3/uL (ref 4.0–10.5)

## 2014-12-13 NOTE — Telephone Encounter (Signed)
SPOKE TO TECH -  PATIENT WILL HAVE TO USE [facility]  [address]. PATIENT CAN NOT USE [facility]- WITHOUT [facility] DOCTOR SIGNING OFF.

## 2014-12-14 ENCOUNTER — Telehealth: Payer: Self-pay | Admitting: Cardiovascular Disease

## 2014-12-14 LAB — BASIC METABOLIC PANEL
BUN: 12 mg/dL (ref 7–25)
CHLORIDE: 99 mmol/L (ref 98–110)
CO2: 32 mmol/L — ABNORMAL HIGH (ref 20–31)
Calcium: 9.8 mg/dL (ref 8.6–10.4)
Creat: 0.66 mg/dL (ref 0.50–1.05)
GLUCOSE: 110 mg/dL — AB (ref 65–99)
POTASSIUM: 4.5 mmol/L (ref 3.5–5.3)
Sodium: 138 mmol/L (ref 135–146)

## 2014-12-14 LAB — TSH: TSH: 3.73 u[IU]/mL (ref 0.350–4.500)

## 2014-12-14 NOTE — Telephone Encounter (Signed)
Pt wants to know if her monitor results are back?

## 2014-12-14 NOTE — Telephone Encounter (Signed)
Spoke with patient and informed her that monitor was uploaded into computer system today and it will more than likely be Monday before we will have results to give her.   She reports she needs her echo and stress test scheduled and has been sick with URI. She would like to go ahead and schedule her echo.   Message sent to [facility] to contact patient to arrange test.

## 2014-12-17 ENCOUNTER — Telehealth: Payer: Self-pay | Admitting: *Deleted

## 2014-12-17 NOTE — Telephone Encounter (Signed)
Spoke to patient. Result given . Verbalized understanding  

## 2014-12-17 NOTE — Telephone Encounter (Signed)
-----   Message from Chilton Si, MD sent at 12/17/2014 10:10 AM EDT ----- Thyroid function, electrolytes, kidney function and blood count normal.

## 2014-12-20 ENCOUNTER — Telehealth: Payer: Self-pay | Admitting: *Deleted

## 2014-12-20 NOTE — Telephone Encounter (Signed)
-----   Message from Chilton Si, MD sent at 12/18/2014  5:22 PM EDT ----- Holter showed occasional early beats from the top and bottom chambers of the heart.  If these are very bothersome, she can diltiazem 60 mg bid.  It is also reasonable to not start any medications and just watch her symptoms, as these are not dangerous rhythms.  3 month follow up.

## 2014-12-20 NOTE — Telephone Encounter (Signed)
Spoke to patient. Result given . Verbalized understanding Patient would like to hold off in taking medications at present time.   She would like to continue with having echo, myoview schedule.sent message to schedulers.  Patient will need a 3 month appointment for follow up

## 2014-12-24 ENCOUNTER — Telehealth (HOSPITAL_COMMUNITY): Payer: Self-pay | Admitting: *Deleted

## 2014-12-27 ENCOUNTER — Ambulatory Visit (HOSPITAL_COMMUNITY)
Admission: RE | Admit: 2014-12-27 | Discharge: 2014-12-27 | Disposition: A | Discharge status: Home / Self Care | Payer: Medicaid Other | Source: Ambulatory Visit | Attending: Internal Medicine | Admitting: Internal Medicine

## 2014-12-27 ENCOUNTER — Ambulatory Visit (INDEPENDENT_AMBULATORY_CARE_PROVIDER_SITE_OTHER): Payer: Medicaid Other | Admitting: Adult Health

## 2014-12-27 ENCOUNTER — Encounter: Payer: Self-pay | Admitting: Adult Health

## 2014-12-27 VITALS — BP 122/74 | HR 82 | Temp 97.4°F | Ht 67.0 in | Wt 126.0 lb

## 2014-12-27 DIAGNOSIS — J438 Other emphysema: Secondary | ICD-10-CM | POA: Diagnosis not present

## 2014-12-27 DIAGNOSIS — J449 Chronic obstructive pulmonary disease, unspecified: Secondary | ICD-10-CM

## 2014-12-27 DIAGNOSIS — J9611 Chronic respiratory failure with hypoxia: Secondary | ICD-10-CM | POA: Diagnosis not present

## 2014-12-27 DIAGNOSIS — J9610 Chronic respiratory failure, unspecified whether with hypoxia or hypercapnia: Secondary | ICD-10-CM | POA: Insufficient documentation

## 2014-12-27 LAB — PULMONARY FUNCTION TEST
DL/VA % PRED: 24 %
DL/VA: 1.24 ml/min/mmHg/L
DLCO COR % PRED: 13 %
DLCO COR: 3.75 ml/min/mmHg
DLCO unc % pred: 13 %
DLCO unc: 3.84 ml/min/mmHg
FEF 25-75 POST: 0.17 L/s
FEF 25-75 Pre: 0.16 L/sec
FEF2575-%CHANGE-POST: 9 %
FEF2575-%PRED-POST: 6 %
FEF2575-%Pred-Pre: 5 %
FEV1-%Change-Post: 6 %
FEV1-%Pred-Post: 18 %
FEV1-%Pred-Pre: 17 %
FEV1-Post: 0.55 L
FEV1-Pre: 0.51 L
FEV1FVC-%Change-Post: -3 %
FEV1FVC-%Pred-Pre: 40 %
FEV6-%CHANGE-POST: 4 %
FEV6-%Pred-Post: 35 %
FEV6-%Pred-Pre: 33 %
FEV6-POST: 1.31 L
FEV6-Pre: 1.25 L
FEV6FVC-%Change-Post: -6 %
FEV6FVC-%Pred-Post: 75 %
FEV6FVC-%Pred-Pre: 80 %
FVC-%Change-Post: 10 %
FVC-%PRED-POST: 47 %
FVC-%Pred-Pre: 42 %
FVC-POST: 1.79 L
FVC-Pre: 1.62 L
POST FEV1/FVC RATIO: 31 %
Post FEV6/FVC ratio: 73 %
Pre FEV1/FVC ratio: 32 %
Pre FEV6/FVC Ratio: 78 %
RV % pred: 246 %
RV: 5.01 L
TLC % pred: 130 %
TLC: 7.16 L

## 2014-12-27 MED ORDER — MOMETASONE FURO-FORMOTEROL FUM 200-5 MCG/ACT IN AERO: 2.0000 | INHALATION_SPRAY | Freq: Two times a day (BID) | RESPIRATORY_TRACT | Status: DC

## 2014-12-27 MED ORDER — IPRATROPIUM BROMIDE 0.02 % IN SOLN: 0.5000 mg | Freq: Four times a day (QID) | RESPIRATORY_TRACT | Status: DC

## 2014-12-27 MED ORDER — ALBUTEROL SULFATE (2.5 MG/3ML) 0.083% IN NEBU
2.5000 mg | INHALATION_SOLUTION | Freq: Once | RESPIRATORY_TRACT | Status: AC
  Administered 2014-12-27: 2.5 mg via RESPIRATORY_TRACT

## 2014-12-27 NOTE — Progress Notes (Signed)
   Subjective:    Patient ID: [name], female    DOB: [birth year], 54 y.o.   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  Had recent bronchitis , tx w/ abx , she is feeling better.  PFT  Today showed FEV1 at 17%, ratio 32, FVC 42%, no BD response. DLCO 13%.  This is decreased from last.  Previously on Spiriva and Pulmicort, no longer taking.  Can not tolerate powder inhalers.  Now on Dulera .  Can not take powders and insurance will not cover  respimat.  Declines flu shot today , wants to wait a little while .  Denies chest pain , orthopnea, edema or fever.    Review of Systems Constitutional:   No  weight loss, night sweats,  Fevers, chills, + fatigue, or  lassitude.  HEENT:   No headaches,  Difficulty swallowing,  Tooth/dental problems, or  Sore throat,                No sneezing, itching, ear ache, nasal congestion, post nasal drip,   CV:  No chest pain,  Orthopnea, PND, swelling in lower extremities, anasarca, dizziness, palpitations, syncope.   GI  No heartburn, indigestion, abdominal pain, nausea, vomiting, diarrhea, change in bowel habits, loss of appetite, bloody stools.   Resp: no deformity   Skin: no rash or lesions.  GU: no dysuria, change in color of urine, no urgency or frequency.  No flank pain, no hematuria   MS:  No joint pain or swelling.  No decreased range of motion.  No back pain.  Psych:  No change in mood or affect. No depression or anxiety.  No memory loss.         Objective:   Physical Exam GEN: A/Ox3; pleasant , NAD, anxious   HEENT:  /AT,  EACs-clear, TMs-wnl, NOSE-clear, THROAT-clear, no lesions, no postnasal drip or exudate noted.   NECK:  Supple w/ fair ROM; no JVD; normal carotid impulses w/o bruits; no thyromegaly or nodules palpated; no lymphadenopathy.  RESP Decreased BS in bases no  accessory muscle use, no dullness to percussion  CARD:  RRR, no m/r/g  , no peripheral edema, pulses intact, no cyanosis or clubbing.  GI:   Soft & nt; nml bowel sounds; no organomegaly or masses detected.  Musco: Warm bil, no deformities or joint swelling noted.   Neuro: alert, no focal deficits noted.    Skin: Warm, no lesions or rashes       Assessment & Plan:

## 2014-12-27 NOTE — Assessment & Plan Note (Signed)
Cont on O2 .  

## 2014-12-27 NOTE — Assessment & Plan Note (Signed)
Progressive COPD  Add atrovent neb  Increase LABA/ICS component   Plan  Increase Dulera 2 puffs Twice daily  , rinse after use.  Begin Atrovent Neb  Four times a day  .  Use Albuterol and ProAir As needed  Only  Get flu shot in a couple of weeks.  Wear oxygen 2l/m with activity and At bedtime   follow up Dr. Marchelle Gearing in 2-3 months and As needed

## 2014-12-27 NOTE — Patient Instructions (Addendum)
Increase Dulera 2 puffs Twice daily  , rinse after use.  Begin Atrovent Neb  Four times a day  .  Use Albuterol and ProAir As needed  Only  Get flu shot in a couple of weeks.  Wear oxygen 2l/m with activity and At bedtime   follow up Dr. Marchelle Gearing in 2-3 months and As needed   Please contact office for sooner follow up if symptoms do not improve or worsen or seek emergency care

## 2014-12-28 ENCOUNTER — Telehealth (HOSPITAL_COMMUNITY): Payer: Self-pay

## 2014-12-28 NOTE — Telephone Encounter (Signed)
Encounter complete. 

## 2015-01-02 ENCOUNTER — Ambulatory Visit (HOSPITAL_COMMUNITY)
Admission: RE | Admit: 2015-01-02 | Discharge: 2015-01-02 | Disposition: A | Discharge status: Home / Self Care | Payer: Medicaid Other | Source: Ambulatory Visit | Attending: Cardiovascular Disease | Admitting: Cardiovascular Disease

## 2015-01-02 DIAGNOSIS — R42 Dizziness and giddiness: Secondary | ICD-10-CM | POA: Diagnosis not present

## 2015-01-02 DIAGNOSIS — Z87891 Personal history of nicotine dependence: Secondary | ICD-10-CM | POA: Insufficient documentation

## 2015-01-02 DIAGNOSIS — R079 Chest pain, unspecified: Secondary | ICD-10-CM | POA: Insufficient documentation

## 2015-01-02 DIAGNOSIS — R002 Palpitations: Secondary | ICD-10-CM | POA: Diagnosis not present

## 2015-01-02 DIAGNOSIS — R0602 Shortness of breath: Secondary | ICD-10-CM | POA: Insufficient documentation

## 2015-01-02 DIAGNOSIS — R600 Localized edema: Secondary | ICD-10-CM

## 2015-01-02 DIAGNOSIS — I10 Essential (primary) hypertension: Secondary | ICD-10-CM | POA: Diagnosis not present

## 2015-01-02 LAB — MYOCARDIAL PERFUSION IMAGING
CHL CUP NUCLEAR SDS: 2
CHL CUP NUCLEAR SRS: 1
CHL CUP NUCLEAR SSS: 3
LV dias vol: 72 mL
LV sys vol: 28 mL
Peak HR: 100 {beats}/min
Rest HR: 76 {beats}/min
TID: 1.21

## 2015-01-02 MED ORDER — AMINOPHYLLINE 25 MG/ML IV SOLN
75.0000 mg | Freq: Once | INTRAVENOUS | Status: AC
  Administered 2015-01-02: 75 mg via INTRAVENOUS

## 2015-01-02 MED ORDER — TECHNETIUM TC 99M SESTAMIBI GENERIC - CARDIOLITE
31.4000 | Freq: Once | INTRAVENOUS | Status: AC | PRN
  Administered 2015-01-02: 31.4 via INTRAVENOUS

## 2015-01-02 MED ORDER — REGADENOSON 0.4 MG/5ML IV SOLN
0.4000 mg | Freq: Once | INTRAVENOUS | Status: AC
  Administered 2015-01-02: 0.4 mg via INTRAVENOUS

## 2015-01-02 MED ORDER — TECHNETIUM TC 99M SESTAMIBI GENERIC - CARDIOLITE
10.3000 | Freq: Once | INTRAVENOUS | Status: AC | PRN
  Administered 2015-01-02: 10.3 via INTRAVENOUS

## 2015-01-03 ENCOUNTER — Ambulatory Visit (HOSPITAL_COMMUNITY): Payer: Medicaid Other | Attending: Internal Medicine

## 2015-01-03 ENCOUNTER — Telehealth: Payer: Self-pay | Admitting: *Deleted

## 2015-01-03 ENCOUNTER — Other Ambulatory Visit: Payer: Self-pay

## 2015-01-03 DIAGNOSIS — R600 Localized edema: Secondary | ICD-10-CM

## 2015-01-03 DIAGNOSIS — R002 Palpitations: Secondary | ICD-10-CM | POA: Diagnosis not present

## 2015-01-03 NOTE — Telephone Encounter (Signed)
Spoke to patient. Result given . Verbalized understanding  

## 2015-01-03 NOTE — Telephone Encounter (Signed)
-----   Message from Chilton Si, MD sent at 01/03/2015  6:33 AM EDT ----- Normal stress test

## 2015-01-03 NOTE — Telephone Encounter (Signed)
Left message to call back- test result 

## 2015-01-03 NOTE — Telephone Encounter (Signed)
Patient is returning your call.  

## 2015-01-04 ENCOUNTER — Telehealth: Payer: Self-pay | Admitting: Cardiovascular Disease

## 2015-01-04 NOTE — Telephone Encounter (Signed)
No results available will call once available Patient verbalized understanding

## 2015-01-04 NOTE — Telephone Encounter (Signed)
Patient would like to get her test results °

## 2015-01-07 ENCOUNTER — Telehealth: Payer: Self-pay | Admitting: *Deleted

## 2015-01-07 NOTE — Telephone Encounter (Signed)
-----   Message from Chilton Si, MD sent at 01/07/2015  2:58 PM EST ----- Normal echo.

## 2015-01-07 NOTE — Telephone Encounter (Signed)
Spoke to patient. Result given . Verbalized understanding  

## 2015-01-09 ENCOUNTER — Encounter (HOSPITAL_COMMUNITY): Payer: Self-pay

## 2015-01-09 ENCOUNTER — Other Ambulatory Visit (HOSPITAL_COMMUNITY): Payer: Self-pay

## 2015-03-12 ENCOUNTER — Ambulatory Visit: Payer: Self-pay | Admitting: Internal Medicine

## 2015-03-12 ENCOUNTER — Telehealth: Payer: Self-pay | Admitting: Internal Medicine

## 2015-03-12 NOTE — Telephone Encounter (Signed)
Spoke with pt, states she is using dulera 2 puffs bid, atrovent nebulizer qid, as well as albuterol hfa and nebulizer prn.  Pt states since starting atrovent nebulizer it has not made a difference in her breathing.  States she is still short of breath with exertion.  Pt had an appt today with MR but had to cancel d/t weather.  Pt is requesting a change in her inhalers.   Pt uses [facility] in on [address]    MR please advise on recs.  Thanks.

## 2015-03-14 NOTE — Telephone Encounter (Signed)
atc pt, line was answered and went dead.  atc back, line busy.  Wcb.

## 2015-03-14 NOTE — Telephone Encounter (Signed)
Hard to come up with a cocktail of nebs/mdi over phoine. Have her see TP so a change for the better can be made and reasons for not feeling better are elicited

## 2015-03-15 NOTE — Telephone Encounter (Signed)
lmtcb x1 for pt. 

## 2015-03-15 NOTE — Telephone Encounter (Signed)
Patient returned call, CB [phone].

## 2015-03-15 NOTE — Telephone Encounter (Signed)
Patient called back asking for [name], whose line was busy.  She began discussing leaving a message for [name] about an appointment time to see NP.  Then said she  will just wait to hear from [place].

## 2015-03-15 NOTE — Telephone Encounter (Signed)
Spoke with pt, refused to see a NP saying that this can be handled over the phone.  Pt is requesting that MR change her medications over the phone.    MR please advise.  Thanks.

## 2015-03-15 NOTE — Telephone Encounter (Signed)
Can offer appt with Kandice Robinsons for 03/20/15 at 4pm or work in OV with TP -- pt refused again, states that she is not coming in the office and risking getting sick by picking something up in our office. Pt wants this discussed over the phone if she cannot see Dr Marchelle Gearing, feels that another appt at this time is unneccessary. Pt states that just needs medications changed at this time and nothing else. --Spent several minutes on the phone with patient yelling and arguing need for appt - asked Tammy Parrett for rec's in MR's absence and she agreed with Dr Marchelle Gearing that the patient needs an OV - explained this to the patient, became very upset again and stated that she will schedule this as she feels that she is not getting anywhere with our office.  Upcoming visit with MR in March 2017  Pt coming in to see Tammy Parrett 03/18/15 at 3:45.  Will send to MR as [facility]

## 2015-03-18 ENCOUNTER — Ambulatory Visit: Payer: Self-pay | Admitting: Adult Health

## 2015-03-19 NOTE — Telephone Encounter (Signed)
Sent to wrong MD by mistake.  Will forward to MR as FYI.

## 2015-03-19 NOTE — Telephone Encounter (Signed)
i think this was sent to me in error.

## 2015-03-20 ENCOUNTER — Ambulatory Visit: Payer: Self-pay | Admitting: Acute Care

## 2015-05-14 ENCOUNTER — Ambulatory Visit (INDEPENDENT_AMBULATORY_CARE_PROVIDER_SITE_OTHER): Payer: Medicaid Other | Admitting: Internal Medicine

## 2015-05-14 ENCOUNTER — Encounter: Payer: Self-pay | Admitting: Internal Medicine

## 2015-05-14 VITALS — BP 118/62 | HR 78 | Ht 67.0 in | Wt 123.0 lb

## 2015-05-14 DIAGNOSIS — J449 Chronic obstructive pulmonary disease, unspecified: Secondary | ICD-10-CM

## 2015-05-14 DIAGNOSIS — R911 Solitary pulmonary nodule: Secondary | ICD-10-CM | POA: Diagnosis not present

## 2015-05-14 MED ORDER — IPRATROPIUM-ALBUTEROL 0.5-2.5 (3) MG/3ML IN SOLN: 3.0000 mL | Freq: Four times a day (QID) | RESPIRATORY_TRACT | Status: DC

## 2015-05-14 MED ORDER — BECLOMETHASONE DIPROPIONATE 80 MCG/ACT IN AERS: 2.0000 | INHALATION_SPRAY | Freq: Two times a day (BID) | RESPIRATORY_TRACT | Status: DC

## 2015-05-14 NOTE — Patient Instructions (Addendum)
ICD-9-CM ICD-10-CM   1. COPD, very severe (HCC) 496 J44.9   2. Nodule of left lung 793.11 R91.1      COPD = stable currently - stop dulera - start duoneb 4 times daily + QVAR 2 puff twice daily scheduled  - use albuterol as needed - repeat spirometry in 4 weeks to restage in case recent pft was falsely low - use o2 to keep pulse ox > 88%   Nodule left lung in 2015  - do ct chest wo contrast next 4 weeks  Followup  < 4 weeks after CT and repeat spirometry - consider rehab referral

## 2015-05-14 NOTE — Progress Notes (Signed)
Subjective:     Patient ID: [name], female   DOB: 11-24-1960, 55 y.o.   MRN: [id number]  HPI   OV 12/04/2014  Chief Complaint  Patient presents with  . Follow-up    Pt former [facility] pt. Pt c/o pain under left breast. Pt c/o increase in SOB with activity. Pt denies cough.     55 year old female with MM alpha 1 phenotype. Reported severe COPD on oxygen. However I do not see any pulmonary function test in the chart. She presents for routine follow-up. She is to be followed by Dr. Jonathon Bellows in the past but now is a transfer of care after he left the practice. She says that her oxygen needs are unspecified. She also says that her inhalers are being changed to many times and she has no idea what she needs to be on. In addition she's had side effects to some inhalers. She is a rambling historian and it is unclear to me what she is trying to say. She also seems focused on some recent admission for ischemic colitis that has made her more deconditioned. She's been using a walker for the last 8 months to one year because of neuromuscular issues. Nevertheless overall his COPD stable.  Walking desaturation test using a walker but 185 feet 3 laps on room air: At one lap she desaturated below 88%. On 2 L she walk 2 laps and maintained a saturation    12/27/2014 Follow up : COPD Pt returns for 3 week follow up .  Had recent bronchitis , tx w/ abx , she is feeling better.  PFT  Today showed FEV1 at 17%, ratio 32, FVC 42%, no BD response. DLCO 13%.  This is decreased from last.  Previously on Spiriva and Pulmicort, no longer taking.  Can not tolerate powder inhalers.  Now on Dulera .  Can not take powders and insurance will not cover  respimat.  Declines flu shot today , wants to wait a little while .  Denies chest pain , orthopnea, edema or fever.      OV 05/14/2015  Chief Complaint  Patient presents with  . Follow-up    Pt here after PFT. Pt states her breathing is unchanged since last OV.  Pt saw TP in October in to adjust medications. Pt states her inhalers arent helping. Pt states her SOB is at baseline. Pt denies cough and CP/tightenss.    Follow-up very severe COPD  I last saw her in the fall 2016. That was my first visit with her because she is a transfer of care. She then followed up with nurse practitioner and Pulmicort function test showed very severe Gold stage IV COPD FEV1 17%. She subsequently had made several phone calls insisting only on seeing me. Getting upset that she could not see nurse practitioner. She wanted to change her nebulizers because Dulera was not working well for her. She says she does not like powdered inhalers. In addition she is very upset about how advanced his COPD might be. She thinks that lung functions are artificially low because of respiratory infection at that time. She is also upset that I only spent "few seconds with her" at her previous visit with me. Nevertheless overall COPD stable. She is worried about her prognosis. CAT  Score at 28 is stable  Review of the chart shows last CT chest May 2015 when she had possible left upper lobe nodule.    CAT COPD Symptom & Quality of Life Score (GSK  trademark) 0 is no burden. 5 is highest burden 12/04/2014  05/14/2015   Never Cough -> Cough all the time 1 2  No phlegm in chest -> Chest is full of phlegm 3 3  No chest tightness -> Chest feels very tight 3 3  No dyspnea for 1 flight stairs/hill -> Very dyspneic for 1 flight of stairs 5 5  No limitations for ADL at home -> Very limited with ADL at home 5 4  Confident leaving home -> Not at all confident leaving home 5 4  Sleep soundly -> Do not sleep soundly because of lung condition 3 4  Lots of Energy -> No energy at all 3 3  TOTAL Score (max 40)  28 28     Current outpatient prescriptions:  .  albuterol (PROVENTIL) (2.5 MG/3ML) 0.083% nebulizer solution, Take 3 mLs (2.5 mg total) by nebulization every 6 (six) hours as needed for shortness of  breath. For shortness of breath, Disp: 75 mL, Rfl: 1 .  albuterol (VENTOLIN HFA) 108 (90 BASE) MCG/ACT inhaler, Inhale 2 puffs into the lungs every 6 (six) hours as needed for wheezing or shortness of breath. , Disp: , Rfl:  .  aspirin EC 81 MG tablet, Take 1 tablet (81 mg total) by mouth daily., Disp: 30 tablet, Rfl: 2 .  Cholecalciferol (VITAMIN D-3) 5000 UNITS TABS, Take 3,000 Units by mouth daily. , Disp: , Rfl:  .  diazepam (VALIUM) 5 MG tablet, Take 2 mg by mouth every 8 (eight) hours. , Disp: , Rfl: 0 .  dicyclomine (BENTYL) 10 MG capsule, Take 1 cap by mouth every 4-6 hrs PRN cramps, Disp: 60 capsule, Rfl: 0 .  docusate sodium 100 MG CAPS, Take 100 mg by mouth daily as needed for mild constipation., Disp: 10 capsule, Rfl: 0 .  DULERA 100-5 MCG/ACT AERO, inhale 2 puffs by mouth twice a day, Disp: 13 g, Rfl: 5 .  Guaifenesin 1200 MG TB12, Take 1 tablet (1,200 mg total) by mouth 2 (two) times daily at 10 AM and 5 PM., Disp: 14 each, Rfl: 0 .  hydrochlorothiazide (MICROZIDE) 12.5 MG capsule, Take 1 capsule (12.5 mg total) by mouth daily., Disp: 30 capsule, Rfl: 11 .  ipratropium (ATROVENT) 0.02 % nebulizer solution, Take 2.5 mLs (0.5 mg total) by nebulization 4 (four) times daily., Disp: 300 mL, Rfl: 12 .  lactose free nutrition (BOOST PLUS) LIQD, Take 237 mLs by mouth 2 (two) times daily between meals., Disp: 60 Can, Rfl: 0 .  mirtazapine (REMERON) 45 MG tablet, Take 45 mg by mouth at bedtime., Disp: , Rfl: 0 .  mometasone (NASONEX) 50 MCG/ACT nasal spray, Place 2 sprays into the nose daily., Disp: 17 g, Rfl: 12 .  mometasone-formoterol (DULERA) 200-5 MCG/ACT AERO, Inhale 2 puffs into the lungs 2 (two) times daily., Disp: 1 Inhaler, Rfl: 5 .  omeprazole (PRILOSEC) 20 MG capsule, Take 1 capsule by mouth 2 (two) times daily., Disp: , Rfl: 0 .  oxyCODONE (ROXICODONE) 15 MG immediate release tablet, Take 15 mg by mouth as needed for pain. , Disp: , Rfl: 0 .  pantoprazole (PROTONIX) 40 MG tablet,  Take 1 tablet (40 mg total) by mouth daily at 6 (six) AM., Disp: 30 tablet, Rfl: 1 .  polyethylene glycol powder (GLYCOLAX/MIRALAX) powder, Take 1 capful daily with 8oz of water, Disp: 255 g, Rfl: 2 .  promethazine (PHENERGAN) 25 MG tablet, Take 0.5-1 tablets (12.5-25 mg total) by mouth every 6 (six) hours as needed for nausea.,  Disp: 30 tablet, Rfl: 0 .  VITAMIN E PO, Take 1 capsule by mouth daily., Disp: , Rfl:    Allergies  Allergen Reactions  . Oxycodone-Acetaminophen Anaphylaxis, Itching and Nausea And Vomiting    Tylox (Pt states, "I can only take Demerol, Oxycontin, and Dilaudid")  . Codeine Nausea And Vomiting  . Hydrocodone Nausea And Vomiting  . Quinolones Hives and Other (See Comments)    Damaged nerves and tendons; If have to take antibiotic, pt could take    Immunization History  Administered Date(s) Administered  . Influenza Split 12/01/2011  . Influenza Whole 01/11/2009, 11/28/2009, 11/01/2010, 12/26/2013  . Influenza,inj,Quad PF,36+ Mos 11/22/2012  . Pneumococcal Conjugate-13 07/27/2014     Review of Systems Per hpi    Objective:   Physical Exam  Filed Vitals:   05/14/15 1624  BP: 118/62  Pulse: 78  Height: 5\' 7"  (1.702 m)  Weight: 123 lb (55.792 kg)  SpO2: 96%    Gen. exam: Skinny female sitting in chair with a walker beside her Muscular skeletal: Uses walker Respiratory exam: Clear to auscultation bilaterally Psychiatric: Anxious predisposition Cardiovascular: Normal heart sounds regular rate and rhythm Abdomen: Soft nontender Extremity: no sinus no clubbing no edema     Assessment:       ICD-9-CM ICD-10-CM   1. COPD, very severe (HCC) 496 J44.9   2. Nodule of left lung 793.11 R91.1 CT Chest Wo Contrast       Plan:      COPD _ I really think she has gold stage 4 copd due to flat COPD CAT Score but at her request we will reassess = stable currently - stop dulera - start duoneb 4 times daily + QVAR 2 puff twice daily scheduled  - use  albuterol as needed - repeat spirometry in 4 weeks to restage in case recent pft was falsely low - use o2 to keep pulse ox > 88%   Nodule left lung in 2015  - do ct chest wo contrast next 4 weeks  Followup  < 4 weeks after CT and repeat spirometry - consider rehab referral     Dr. Kalman Shan, M.D., [facility].C.P Pulmonary and Critical Care Medicine Staff Physician [facility] Pager: [phone], If no answer or between  15:00h - 7:00h: call [phone]  05/14/2015 4:50 PM

## 2015-05-17 ENCOUNTER — Telehealth: Payer: Self-pay | Admitting: Internal Medicine

## 2015-05-17 DIAGNOSIS — J449 Chronic obstructive pulmonary disease, unspecified: Secondary | ICD-10-CM

## 2015-05-17 NOTE — Telephone Encounter (Signed)
Spoke with the pt  She states that she wants rehab referral now  She does not want to wait until next appt  See last AVS:    ICD-9-CM ICD-10-CM   1. COPD, very severe (HCC) 496 J44.9   2. Nodule of left lung 793.11 R91.1      COPD = stable currently - stop dulera - start duoneb 4 times daily + QVAR 2 puff twice daily scheduled - use albuterol as needed - repeat spirometry in 4 weeks to restage in case recent pft was falsely low - use o2 to keep pulse ox > 88%   Nodule left lung in 2015 - do ct chest wo contrast next 4 weeks  Followup < 4 weeks after CT and repeat spirometry - consider rehab referral

## 2015-05-21 NOTE — Telephone Encounter (Signed)
Ok to refer to rehab; dx is emphysema

## 2015-05-21 NOTE — Telephone Encounter (Signed)
Spoke with pt and advised that referral for pulm rehab ok'd by MR and someone will be contacting her in the next few days.  Order placed.

## 2015-05-22 ENCOUNTER — Ambulatory Visit (INDEPENDENT_AMBULATORY_CARE_PROVIDER_SITE_OTHER)
Admission: RE | Admit: 2015-05-22 | Discharge: 2015-05-22 | Disposition: A | Discharge status: Home / Self Care | Payer: Medicaid Other | Source: Ambulatory Visit | Attending: Internal Medicine | Admitting: Internal Medicine

## 2015-05-22 DIAGNOSIS — R911 Solitary pulmonary nodule: Secondary | ICD-10-CM | POA: Diagnosis not present

## 2015-05-27 ENCOUNTER — Telehealth: Payer: Self-pay | Admitting: Internal Medicine

## 2015-05-27 DIAGNOSIS — I2510 Atherosclerotic heart disease of native coronary artery without angina pectoris: Secondary | ICD-10-CM

## 2015-05-27 DIAGNOSIS — I2584 Coronary atherosclerosis due to calcified coronary lesion: Principal | ICD-10-CM

## 2015-05-27 NOTE — Telephone Encounter (Signed)
[phone], pt calling for CT results

## 2015-05-27 NOTE — Telephone Encounter (Signed)
Spoke with pt. She is aware of CT results. Referral will be placed for Cardiology. Nothing further was needed.

## 2015-05-27 NOTE — Telephone Encounter (Signed)
   Let [name] June 27, 1960  Know that CT  A) nodule is "calcified" and therefore not cancer and is benign B) no evidencd of cancer in lung C) has coronary artery calcification - most likely due to ageing but to be on safe side to make sure no heart blood vessel blockage - refer to cards

## 2015-06-04 ENCOUNTER — Telehealth: Payer: Self-pay | Admitting: Cardiovascular Disease

## 2015-06-04 DIAGNOSIS — Z13220 Encounter for screening for lipoid disorders: Secondary | ICD-10-CM

## 2015-06-04 NOTE — Telephone Encounter (Signed)
Follow up ° ° °Pt is returning call to rn.  °

## 2015-06-04 NOTE — Telephone Encounter (Signed)
Left message to call back  

## 2015-06-04 NOTE — Telephone Encounter (Signed)
New message     Patient calling back to speak with nurse regarding previous voice mails that was left - ordering test or setting up a test.

## 2015-06-04 NOTE — Telephone Encounter (Signed)
Left msg to call.

## 2015-06-04 NOTE — Telephone Encounter (Signed)
Spoke with pt, she recently had a CT scan for her lungs and they said she needed to call us. 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 She had a nuclear stress test and echo the end of last year that were fine. ? Does she need to be seen or have any other testing done? Pt made aware she can have plague in the arteries that does not interfere with the blood flow. She wants to know what she needs to do next Will forward for dr Duke Salvia review

## 2015-06-04 NOTE — Telephone Encounter (Signed)
Given that she had a normal stress test recently, we don't need to repeat this.  Continue aspirin. We should check fasting lipids and a CMP.  She may need a cholesterol medicine based on this new information.

## 2015-06-05 ENCOUNTER — Ambulatory Visit: Payer: Self-pay | Admitting: Internal Medicine

## 2015-06-06 NOTE — Telephone Encounter (Signed)
Patient has ischemic colitis and has been holding ASA secondary to bleeding, not healed completely She states blood in bowels improved but does still see some Patient stated that TIA and ischemic colitis secondary to reaction to Cymbalta Will forward to Dr Duke Salvia for review

## 2015-06-06 NOTE — Telephone Encounter (Signed)
Pt is wanting to speak back to [place] on if she will need a f/u appt moving forward, please f/u with her  Thanks

## 2015-06-07 ENCOUNTER — Telehealth: Payer: Self-pay | Admitting: Internal Medicine

## 2015-06-07 NOTE — Telephone Encounter (Signed)
OK to hold aspirin while bleeding.  Restart when her GI doctor or PCP think it is safe.

## 2015-06-07 NOTE — Telephone Encounter (Signed)
Advised patient, verbalized understanding  

## 2015-06-07 NOTE — Telephone Encounter (Signed)
Will contact [facility] on Monday 06/10/15 as they are now closed.

## 2015-06-07 NOTE — Addendum Note (Signed)
Addended by: Regis Bill B on: 06/07/2015 06:20 PM   Modules accepted: Orders, Medications

## 2015-06-10 NOTE — Telephone Encounter (Signed)
Called [facility] - they have been unable to get in touch with the patient. Advised that I would attempt to reach her and have her contact them to schedule an OV. Aware that someone from her office spoke to the patient on 06/07/15 so the number on the patient chart is working.  Called and LM for patient x 1

## 2015-06-11 NOTE — Telephone Encounter (Signed)
lmtcb x2 for pt. 

## 2015-06-12 NOTE — Telephone Encounter (Signed)
Left message for patient to call back  

## 2015-06-17 NOTE — Telephone Encounter (Signed)
lmtcb for pt.  

## 2015-06-17 NOTE — Telephone Encounter (Signed)
Pt returning call.[name] ° °

## 2015-06-18 NOTE — Telephone Encounter (Signed)
atc pt X2, went to fast busy signal.  wcb.

## 2015-06-19 NOTE — Telephone Encounter (Signed)
Spoke with pt.  Pt states she has already called cardiology back.  No appt is needed at this time - see phone msg from cards on 06/04/15.

## 2015-07-03 ENCOUNTER — Other Ambulatory Visit: Payer: Self-pay | Admitting: Adult Health

## 2015-07-03 NOTE — Telephone Encounter (Signed)
Received paper refill request from [facility] Take 2 puffs by mouth twice daily Last refilled 05/10/15  Per last ov note with MR pt was to stop dulera.  Refill request denied

## 2015-07-09 ENCOUNTER — Ambulatory Visit (INDEPENDENT_AMBULATORY_CARE_PROVIDER_SITE_OTHER): Payer: Medicaid Other | Admitting: Internal Medicine

## 2015-07-09 ENCOUNTER — Encounter: Payer: Self-pay | Admitting: Internal Medicine

## 2015-07-09 VITALS — BP 102/60 | HR 73 | Ht 67.0 in | Wt 117.6 lb

## 2015-07-09 DIAGNOSIS — Z720 Tobacco use: Secondary | ICD-10-CM

## 2015-07-09 DIAGNOSIS — R911 Solitary pulmonary nodule: Secondary | ICD-10-CM | POA: Diagnosis not present

## 2015-07-09 DIAGNOSIS — J449 Chronic obstructive pulmonary disease, unspecified: Secondary | ICD-10-CM | POA: Diagnosis not present

## 2015-07-09 DIAGNOSIS — Z87891 Personal history of nicotine dependence: Secondary | ICD-10-CM

## 2015-07-09 MED ORDER — TIOTROPIUM BROMIDE-OLODATEROL 2.5-2.5 MCG/ACT IN AERS: 2.0000 | INHALATION_SPRAY | Freq: Every day | RESPIRATORY_TRACT | Status: DC

## 2015-07-09 MED ORDER — BUDESONIDE 0.25 MG/2ML IN SUSP: 0.2500 mg | Freq: Two times a day (BID) | RESPIRATORY_TRACT | Status: DC

## 2015-07-09 NOTE — Patient Instructions (Signed)
ICD-9-CM ICD-10-CM   1. COPD, very severe (HCC) 496 J44.9   2. Nodule of left lung 793.11 R91.1   3. 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

## 2015-07-09 NOTE — Progress Notes (Signed)
Subjective:     Patient ID: [name], female   DOB: 02-17-1961, 55 y.o.   MRN: [id number]  HPI    OV 12/04/2014  Chief Complaint  Patient presents with  . Follow-up    Pt former [facility] pt. Pt c/o pain under left breast. Pt c/o increase in SOB with activity. Pt denies cough.     55 year old female with MM alpha 1 phenotype. Reported severe COPD on oxygen. However I do not see any pulmonary function test in the chart. She presents for routine follow-up. She is to be followed by Dr. Jonathon Bellows in the past but now is a transfer of care after he left the practice. She says that her oxygen needs are unspecified. She also says that her inhalers are being changed to many times and she has no idea what she needs to be on. In addition she's had side effects to some inhalers. She is a rambling historian and it is unclear to me what she is trying to say. She also seems focused on some recent admission for ischemic colitis that has made her more deconditioned. She's been using a walker for the last 8 months to one year because of neuromuscular issues. Nevertheless overall his COPD stable.  Walking desaturation test using a walker but 185 feet 3 laps on room air: At one lap she desaturated below 88%. On 2 L she walk 2 laps and maintained a saturation    12/27/2014 Follow up : COPD Pt returns for 3 week follow up .  Had recent bronchitis , tx w/ abx , she is feeling better.  PFT  Today showed FEV1 at 17%, ratio 32, FVC 42%, no BD response. DLCO 13%.  This is decreased from last.  Previously on Spiriva and Pulmicort, no longer taking.  Can not tolerate powder inhalers.  Now on Dulera .  Can not take powders and insurance will not cover  respimat.  Declines flu shot today , wants to wait a little while .  Denies chest pain , orthopnea, edema or fever.      OV 05/14/2015  Chief Complaint  Patient presents with  . Follow-up    Pt here after PFT. Pt states her breathing is unchanged since last OV.  Pt saw TP in October in to adjust medications. Pt states her inhalers arent helping. Pt states her SOB is at baseline. Pt denies cough and CP/tightenss.    Follow-up very severe COPD  I last saw her in the fall 2016. That was my first visit with her because she is a transfer of care. She then followed up with nurse practitioner and Pulmicort function test showed very severe Gold stage IV COPD FEV1 17%. She subsequently had made several phone calls insisting only on seeing me. Getting upset that she could not see nurse practitioner. She wanted to change her nebulizers because Dulera was not working well for her. She says she does not like powdered inhalers. In addition she is very upset about how advanced his COPD might be. She thinks that lung functions are artificially low because of respiratory infection at that time. She is also upset that I only spent "few seconds with her" at her previous visit with me. Nevertheless overall COPD stable. She is worried about her prognosis. CAT  Score at 28 is stable  Review of the chart shows last CT chest May 2015 when she had possible left upper lobe nodule.    OV 07/09/2015  Chief Complaint  Patient presents  with  . Follow-up    Pt states the Qvar does not work and pt changed herself back to [place]. Pt states her breathing has improved since last OV.     Follow-up Gold stage IV COPD   Last visit I change her to DuoNeb was Qvar. She tells me that inhalers do not work well for her. She chokes on inhaler particles. She only wants mist based inhalers.  She stopped taking her Qvar and is now on Dulera long with the DuoNeb. In essence she is getting too much long-acting beta agonist. She feels overall a little bit better. She is frustrated that she has to be on inhalers. In addition she feels that the smoking did not cause her Gold stage IV COPD. She says in 2010 she was on ciprofloxacin and Levaquin at [facility] for "blew up my lung". She feels that  these antibiotics are extremely unsafe to the lungs. She says that former FDA commissioner Natalia Leatherwood [place] deliberately suppressed this information because her husband at confident of interest to the pharmaceutical industry. 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 She is asking about repeat pulmonary function test. COPD cat score is  ft she had CT chest in April 2017 at the lung nodule is calcified. I personally visualized the CT chest. She has coronary artery calcification but in November 2016 she had a cardiac stress test is normal.   CAT COPD Symptom & Quality of Life Score (GSK trademark) 0 is no burden. 5 is highest burden 12/04/2014  05/14/2015  07/09/2015   Never Cough -> Cough all the time 1 2 1   No phlegm in chest -> Chest is full of phlegm 3 3 3   No chest tightness -> Chest feels very tight 3 3 3   No dyspnea for 1 flight stairs/hill -> Very dyspneic for 1 flight of stairs 5 5 5   No limitations for ADL at home -> Very limited with ADL at home 5 4 4   Confident leaving home -> Not at all confident leaving home 5 4 3   Sleep soundly -> Do not sleep soundly because of lung condition 3 4 3   Lots of Energy -> No energy at all 3 3 3   TOTAL Score (max 40)  28 28 25       has a past medical history of TIA (transient ischemic attack); GERD (gastroesophageal reflux disease); Hepatomegaly; Colonic inertia; Lung abscess (HCC); COPD (chronic obstructive pulmonary disease) (HCC); Stroke [facility]) (2009); PTSD (post-traumatic stress disorder); Bipolar disorder (HCC); Depression; Chronic headache; Seizures (HCC); Fibromyalgia; Iron deficiency anemia; Arthritis; Acute ischemic colitis (HCC) (11/01/2012); Marijuana abuse (2014); and TIA (transient ischemic attack).   reports that she quit smoking about 17 months ago. Her smoking use included Cigarettes. She has a 39 pack-year smoking history. She has never used smokeless tobacco.  Past Surgical History  Procedure Laterality Date   . 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 Cesarean section      x 2  . Esophagogastroduodenoscopy  12/23/2011    Procedure: ESOPHAGOGASTRODUODENOSCOPY (EGD);  Surgeon: Hart Carwin, MD;  Location: [facility];  Service: Endoscopy;  Laterality: N/A;  . Colonoscopy    . Colonoscopy N/A 11/01/2012    Procedure: COLONOSCOPY;  Surgeon: Iva Boop, MD;  Location: [facility];  Service: Endoscopy;  Laterality: N/A;    Allergies  Allergen Reactions  . Oxycodone-Acetaminophen Anaphylaxis, Itching and Nausea And  Vomiting    Tylox (Pt states, "I can only take Demerol, Oxycontin, and Dilaudid")  . Codeine Nausea And Vomiting  . Hydrocodone Nausea And Vomiting  . Quinolones Hives and Other (See Comments)    Damaged nerves and tendons; If have to take antibiotic, pt could take    Immunization History  Administered Date(s) Administered  . Influenza Split 12/01/2011  . Influenza Whole 01/11/2009, 11/28/2009, 11/01/2010, 12/26/2013  . Influenza,inj,Quad PF,36+ Mos 11/22/2012  . Pneumococcal Conjugate-13 07/27/2014    Family History  Problem Relation Age of Onset  . Emphysema Father   . Asthma Son   . Clotting disorder Mother   . Rheum arthritis Sister   . Rheum arthritis Brother   . Ovarian cancer Sister      Current outpatient prescriptions:  .  albuterol (PROVENTIL) (2.5 MG/3ML) 0.083% nebulizer solution, Take 3 mLs (2.5 mg total) by nebulization every 6 (six) hours as needed for shortness of breath. For shortness of breath, Disp: 75 mL, Rfl: 1 .  albuterol (VENTOLIN HFA) 108 (90 BASE) MCG/ACT inhaler, Inhale 2 puffs into the lungs every 6 (six) hours as needed for wheezing or shortness of breath. , Disp: , Rfl:  .  Cholecalciferol (VITAMIN D-3) 5000 UNITS TABS, Take 3,000 Units by mouth daily. , Disp: , Rfl:  .  diazepam (VALIUM) 5 MG tablet, Take 2 mg by mouth every 8 (eight) hours. Reported on 05/14/2015, Disp: , Rfl: 0 .  dicyclomine (BENTYL)  10 MG capsule, Take 1 cap by mouth every 4-6 hrs PRN cramps, Disp: 60 capsule, Rfl: 0 .  Guaifenesin 1200 MG TB12, Take 1 tablet (1,200 mg total) by mouth 2 (two) times daily at 10 AM and 5 PM., Disp: 14 each, Rfl: 0 .  hydrochlorothiazide (MICROZIDE) 12.5 MG capsule, Take 1 capsule (12.5 mg total) by mouth daily. (Patient taking differently: Take 12.5 mg by mouth daily as needed. ), Disp: 30 capsule, Rfl: 11 .  ipratropium-albuterol (DUONEB) 0.5-2.5 (3) MG/3ML SOLN, Take 3 mLs by nebulization 4 (four) times daily., Disp: 360 mL, Rfl: 11 .  lactose free nutrition (BOOST PLUS) LIQD, Take 237 mLs by mouth 2 (two) times daily between meals., Disp: 60 Can, Rfl: 0 .  mirtazapine (REMERON) 45 MG tablet, Take 45 mg by mouth at bedtime., Disp: , Rfl: 0 .  mometasone (NASONEX) 50 MCG/ACT nasal spray, Place 2 sprays into the nose daily., Disp: 17 g, Rfl: 12 .  mometasone-formoterol (DULERA) 200-5 MCG/ACT AERO, Inhale 2 puffs into the lungs 2 (two) times daily., Disp: 1 Inhaler, Rfl: 5 .  oxyCODONE (ROXICODONE) 15 MG immediate release tablet, Take 15 mg by mouth as needed for pain. , Disp: , Rfl: 0 .  pantoprazole (PROTONIX) 40 MG tablet, Take 1 tablet (40 mg total) by mouth daily at 6 (six) AM. (Patient taking differently: Take 40 mg by mouth daily as needed. ), Disp: 30 tablet, Rfl: 1 .  promethazine (PHENERGAN) 25 MG tablet, Take 0.5-1 tablets (12.5-25 mg total) by mouth every 6 (six) hours as needed for nausea., Disp: 30 tablet, Rfl: 0 .  VITAMIN E PO, Take 1 capsule by mouth daily., Disp: , Rfl:     Review of Systems     Objective:   Physical Exam  Constitutional: She is oriented to person, place, and time. She appears well-developed and well-nourished. No distress.  HENT:  Head: Normocephalic and atraumatic.  Right Ear: External ear normal.  Left Ear: External ear normal.  Mouth/Throat: Oropharynx is clear and moist. No  oropharyngeal exudate.  Eyes: Conjunctivae and EOM are normal. Pupils are  equal, round, and reactive to light. Right eye exhibits no discharge. Left eye exhibits no discharge. No scleral icterus.  Neck: Normal range of motion. Neck supple. No JVD present. No tracheal deviation present. No thyromegaly present.  Cardiovascular: Normal rate, regular rhythm, normal heart sounds and intact distal pulses.  Exam reveals no gallop and no friction rub.   No murmur heard. Pulmonary/Chest: Effort normal and breath sounds normal. No respiratory distress. She has no wheezes. She has no rales. She exhibits no tenderness.  Abdominal: Soft. Bowel sounds are normal. She exhibits no distension and no mass. There is no tenderness. There is no rebound and no guarding.  Musculoskeletal: Normal range of motion. She exhibits no edema or tenderness.  Lymphadenopathy:    She has no cervical adenopathy.  Neurological: She is alert and oriented to person, place, and time. She has normal reflexes. No cranial nerve deficit. She exhibits normal muscle tone. Coordination normal.  Skin: Skin is warm and dry. No rash noted. She is not diaphoretic. No erythema. No pallor.  Psychiatric: She has a normal mood and affect. Her behavior is normal. Judgment and thought content normal.   Cries periodically ansious disposition  Vitals reviewed.    Filed Vitals:   07/09/15 1555  BP: 102/60  Pulse: 73  Height:  (1.702 m)  Weight: 117 lb 9.6 oz (53.343 kg)  SpO2: 98%   This is on room air pulse ox     Assessment:        ICD-9-CM ICD-10-CM   1. COPD, very severe (HCC) 496 J44.9   2. Nodule of left lung 793.11 R91.1   3. History of smoking V15.82 Z72.0         Plan:      #COPD  Currently stable disease I understand your desire not taking inhalers due to this choking you Stop duoneb and dulera/qvar Start stiolto daily Start pulmicort nebulizer 0.25mg  twice daily  - Above is to guarantee mist based treatment Use albueterol as needed Repeat PFT in 3 months  #smoking  - glad is  under remission  #nodule  - this is calcified in spirng 2017 and not cancer - will consider you in future for annual low dose CT  #Followup 3 months or sooner if needed  > 50% of this > 25 min visit spent in face to face counseling or coordination of care   -  Dr. Kalman Shan, M.D., [facility] Pulmonary and Critical Care Medicine Staff Physician [facility] Pager: [phone], If no answer or between  15:00h - 7:00h: call [phone]  07/09/2015 4:30 PM

## 2015-07-10 ENCOUNTER — Ambulatory Visit: Payer: Self-pay | Admitting: Gastroenterology

## 2015-07-15 ENCOUNTER — Inpatient Hospital Stay (HOSPITAL_COMMUNITY): Admission: RE | Admit: 2015-07-15 | Payer: Self-pay | Source: Ambulatory Visit

## 2015-07-18 ENCOUNTER — Telehealth: Payer: Self-pay | Admitting: Internal Medicine

## 2015-07-18 NOTE — Telephone Encounter (Signed)
Spoke wit pt.  Advised MR is ok to d/t pulmicort neb.  Pt verbalized understanding.  Med list updated.  Pt aware.  MR, pt would like to know if another medication needs to be started in place on pulmicort neb.  Please advise.  Thank you.

## 2015-07-18 NOTE — Telephone Encounter (Signed)
She can try an inhaler like flovent or arunity or pulmicort but I think she has problem wih mid. If not, is ok not to be on ics because she is doing well now

## 2015-07-18 NOTE — Telephone Encounter (Signed)
LMTCB

## 2015-07-18 NOTE — Telephone Encounter (Signed)
Spoke with the pt  She states that she does not wish to take pulmicort nebs anymore  We saw her on 07/09/15 and she was given the following instructions:   Patient Instructions       ICD-9-CM ICD-10-CM   1. COPD, very severe (HCC) 496 J44.9   2. Nodule of left lung 793.11 R91.1   3. History of smoking V15.82 Z72.0     #COPD  Currently stable disease I understand your desire not taking inhalers due to this choking you Stop duoneb and dulera/qvar Start stiolto daily Start pulmicort nebulizer 0.25mg  twice daily Use albueterol as needed Repeat PFT in 3 months  #smoking - glad is under remission  #nodule - this is calcified in spirng 2017 and not cancer - will consider you in future for annual low dose CT  #Followup 3 months or sooner if needed    She reports that she likes the Stiolto, but after she takes pulmicort her heart races and this makes her have labored breathing  She is has not had to use ventolin since this last visit  Please advise on stopping pulnicort, thanks

## 2015-07-18 NOTE — Telephone Encounter (Signed)
That is fine - dc pulmicort

## 2015-07-19 NOTE — Telephone Encounter (Signed)
The lowest dose daily

## 2015-07-19 NOTE — Telephone Encounter (Signed)
Pt would like to try Arnuity.   MR please advise as to strength. Thanks!

## 2015-07-19 NOTE — Telephone Encounter (Signed)
Patient called returning our call -prm  °

## 2015-07-19 NOTE — Telephone Encounter (Signed)
lmtcb x1 for pt. 

## 2015-07-22 NOTE — Telephone Encounter (Signed)
atc pt, no answer, no vm set up.  Wcb.  

## 2015-07-23 NOTE — Telephone Encounter (Signed)
Attempted to contact pt. No answer, voicemail is not set up. Will try back. 

## 2015-07-24 ENCOUNTER — Telehealth: Payer: Self-pay | Admitting: Internal Medicine

## 2015-07-24 MED ORDER — FLUTICASONE FUROATE 100 MCG/ACT IN AEPB: 1.0000 | INHALATION_SPRAY | Freq: Every day | RESPIRATORY_TRACT | Status: DC

## 2015-07-24 NOTE — Telephone Encounter (Signed)
  Kalman Shan, MD at 07/19/2015 11:39 AM     Status: Signed       Expand All Collapse All   The lowest dose daily            Vito Backers, CMA at 07/19/2015 9:32 AM     Status: Signed       Expand All Collapse All   Pt would like to try Arnuity.   MR please advise as to strength. Thanks!       Pt very rude and argumentive on the phone for no apparent reason - pt frustrated with the fact the we have not gotten back to her before today - I apologized for any inconvenience but that we have been trying to reach her since 07/18/15 and have not been able to. Pt finally allowed me to speak so that I could explain to her the rec's per MR of Arnuity . 1 puff qd, pt to come by and pick up samples to try and then will fill Rx if she tolerates well. Pt aware to call back if she does not tolerate medication. Pt then hung up the phone.  Will send to MR As FYI.

## 2015-07-24 NOTE — Telephone Encounter (Signed)
atc pt X4, no answer, no vm.  Letter sent.  Will close message per triage protocol.

## 2015-07-30 ENCOUNTER — Ambulatory Visit: Payer: Self-pay | Admitting: Gastroenterology

## 2015-07-30 ENCOUNTER — Telehealth: Payer: Self-pay | Admitting: Internal Medicine

## 2015-07-30 NOTE — Telephone Encounter (Signed)
NO voicemail set up.  Unable to leave a message wcb

## 2015-07-31 NOTE — Telephone Encounter (Signed)
Attempted to contact pt. No answer, no option to leave a message. Will try back.  

## 2015-07-31 NOTE — Telephone Encounter (Signed)
flovent 2 puff bid She is always rude.

## 2015-07-31 NOTE — Telephone Encounter (Signed)
[phone] or [phone] pt calling back very frustrated needs her meds called in

## 2015-07-31 NOTE — Telephone Encounter (Signed)
Spoke with pt. She was very rude and argumentative. States that she has been telling us for 3 weeks that Arunity has not been working. Pt was told that we could try Arunity, Pulmicort and Flovent. She does not want to be put on Pulmicort. Pt wants to try Flovent.  MR - please advise what strength we can give her.  Thanks.

## 2015-07-31 NOTE — Telephone Encounter (Signed)
Attempted to contact pt on both numbers that were left previously. No answer, no option to leave a message. Will try back.

## 2015-07-31 NOTE — Telephone Encounter (Signed)
Patient returned call, CB is [phone]. States her VM is not working.  Advised her to stay by the phone.

## 2015-07-31 NOTE — Telephone Encounter (Signed)
Attempted to contact pt. No answer, voicemail is not set up. Will try back. 

## 2015-08-01 ENCOUNTER — Emergency Department (HOSPITAL_COMMUNITY): Payer: Medicaid Other

## 2015-08-01 ENCOUNTER — Ambulatory Visit
Admission: RE | Admit: 2015-08-01 | Discharge: 2015-08-01 | Disposition: A | Discharge status: Home / Self Care | Payer: Medicaid Other | Source: Ambulatory Visit | Attending: Family Medicine | Admitting: Family Medicine

## 2015-08-01 ENCOUNTER — Other Ambulatory Visit: Payer: Self-pay | Admitting: Family Medicine

## 2015-08-01 ENCOUNTER — Encounter (HOSPITAL_COMMUNITY): Payer: Self-pay | Admitting: Emergency Medicine

## 2015-08-01 ENCOUNTER — Emergency Department (HOSPITAL_COMMUNITY)
Admission: EM | Admit: 2015-08-01 | Discharge: 2015-08-01 | Disposition: A | Discharge status: Home / Self Care | Payer: Medicaid Other | Attending: Emergency Medicine | Admitting: Emergency Medicine

## 2015-08-01 DIAGNOSIS — S7002XA Contusion of left hip, initial encounter: Secondary | ICD-10-CM | POA: Diagnosis not present

## 2015-08-01 DIAGNOSIS — Y9389 Activity, other specified: Secondary | ICD-10-CM | POA: Insufficient documentation

## 2015-08-01 DIAGNOSIS — Z87891 Personal history of nicotine dependence: Secondary | ICD-10-CM | POA: Insufficient documentation

## 2015-08-01 DIAGNOSIS — J449 Chronic obstructive pulmonary disease, unspecified: Secondary | ICD-10-CM | POA: Diagnosis not present

## 2015-08-01 DIAGNOSIS — S0990XA Unspecified injury of head, initial encounter: Secondary | ICD-10-CM

## 2015-08-01 DIAGNOSIS — Z8673 Personal history of transient ischemic attack (TIA), and cerebral infarction without residual deficits: Secondary | ICD-10-CM | POA: Diagnosis not present

## 2015-08-01 DIAGNOSIS — R51 Headache: Secondary | ICD-10-CM

## 2015-08-01 DIAGNOSIS — M25552 Pain in left hip: Secondary | ICD-10-CM | POA: Insufficient documentation

## 2015-08-01 DIAGNOSIS — S0012XA Contusion of left eyelid and periocular area, initial encounter: Secondary | ICD-10-CM | POA: Diagnosis not present

## 2015-08-01 DIAGNOSIS — S090XXA Injury of blood vessels of head, not elsewhere classified, initial encounter: Secondary | ICD-10-CM

## 2015-08-01 DIAGNOSIS — R519 Headache, unspecified: Secondary | ICD-10-CM

## 2015-08-01 DIAGNOSIS — M25512 Pain in left shoulder: Secondary | ICD-10-CM | POA: Diagnosis not present

## 2015-08-01 DIAGNOSIS — Y929 Unspecified place or not applicable: Secondary | ICD-10-CM | POA: Insufficient documentation

## 2015-08-01 DIAGNOSIS — Y999 Unspecified external cause status: Secondary | ICD-10-CM | POA: Insufficient documentation

## 2015-08-01 DIAGNOSIS — W19XXXA Unspecified fall, initial encounter: Secondary | ICD-10-CM

## 2015-08-01 DIAGNOSIS — S159XXA Injury of unspecified blood vessel at neck level, initial encounter: Principal | ICD-10-CM

## 2015-08-01 DIAGNOSIS — T07XXXA Unspecified multiple injuries, initial encounter: Secondary | ICD-10-CM

## 2015-08-01 DIAGNOSIS — W0110XA Fall on same level from slipping, tripping and stumbling with subsequent striking against unspecified object, initial encounter: Secondary | ICD-10-CM | POA: Insufficient documentation

## 2015-08-01 MED ORDER — FLUTICASONE PROPIONATE HFA 110 MCG/ACT IN AERO: 2.0000 | INHALATION_SPRAY | Freq: Two times a day (BID) | RESPIRATORY_TRACT | Status: DC

## 2015-08-01 MED ORDER — MELOXICAM 15 MG PO TABS: 15.0000 mg | ORAL_TABLET | Freq: Every day | ORAL | Status: DC

## 2015-08-01 MED ORDER — KETOROLAC TROMETHAMINE 30 MG/ML IJ SOLN
30.0000 mg | Freq: Once | INTRAMUSCULAR | Status: AC
  Administered 2015-08-01: 30 mg via INTRAVENOUS
  Filled 2015-08-01: qty 1

## 2015-08-01 MED ORDER — FENTANYL CITRATE (PF) 100 MCG/2ML IJ SOLN
50.0000 ug | Freq: Once | INTRAMUSCULAR | Status: DC
Start: 2015-08-01 — End: 2015-08-01

## 2015-08-01 MED ORDER — FENTANYL CITRATE (PF) 100 MCG/2ML IJ SOLN
100.0000 ug | Freq: Once | INTRAMUSCULAR | Status: AC
  Administered 2015-08-01: 100 ug via INTRAVENOUS
  Filled 2015-08-01: qty 2

## 2015-08-01 MED ORDER — METHOCARBAMOL 500 MG PO TABS: 500.0000 mg | ORAL_TABLET | Freq: Two times a day (BID) | ORAL | Status: DC

## 2015-08-01 MED ORDER — SODIUM CHLORIDE 0.9 % IV BOLUS (SEPSIS)
2000.0000 mL | Freq: Once | INTRAVENOUS | Status: AC
  Administered 2015-08-01: 2000 mL via INTRAVENOUS

## 2015-08-01 NOTE — ED Provider Notes (Signed)
CSN: [id number]     Arrival date & time 08/01/15  0134 History   By signing my name below, I, Evon Slack, attest that this documentation has been prepared under the direction and in the presence of Jayshon Dommer, MD. Electronically Signed: Evon Slack, ED Scribe. 08/01/2015. 3:16 AM.    No chief complaint on file.   Patient is a 55 y.o. female presenting with fall. The history is provided by the patient.  Fall This is a new problem. The current episode started less than 1 hour ago. The problem occurs constantly. The problem has not changed since onset.Pertinent negatives include no chest pain, no abdominal pain, no headaches and no shortness of breath. Nothing aggravates the symptoms. Nothing relieves the symptoms. She has tried nothing for the symptoms. The treatment provided no relief.   HPI Comments: [name] is a 55 y.o. female who presents to the Emergency Department complaining of fall onset PTA. Pt present with hematoma to the left eye brow. Pt reports that she tripped over her oxygen tank tubing. Pt states that she fell head first. Pt is complaining of HA, left shoulder pain and left hip pain. Pt reports associated swelling to her left hip.  States she took nothing for pain and is on no pain medication at home  Past Medical History  Diagnosis Date  . TIA (transient ischemic attack)   . GERD (gastroesophageal reflux disease)   . Hepatomegaly     hx  . Colonic inertia   . Lung abscess (HCC)     "calcified over"; bilateral   . COPD (chronic obstructive pulmonary disease) (HCC)   . Stroke [facility]) 2009    TIA  . PTSD (post-traumatic stress disorder)   . Bipolar disorder (HCC)   . Depression   . Chronic headache   . Seizures (HCC)     in past  . Fibromyalgia   . Iron deficiency anemia   . Arthritis   . Acute ischemic colitis (HCC) 11/01/2012  . Marijuana abuse 2014    positive screen in hospital  . TIA (transient ischemic attack)    Past Surgical History  Procedure  Laterality Date  . 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 Cesarean section      x 2  . Esophagogastroduodenoscopy  12/23/2011    Procedure: ESOPHAGOGASTRODUODENOSCOPY (EGD);  Surgeon: Hart Carwin, MD;  Location: [facility];  Service: Endoscopy;  Laterality: N/A;  . Colonoscopy    . Colonoscopy N/A 11/01/2012    Procedure: COLONOSCOPY;  Surgeon: Iva Boop, MD;  Location: [facility] ENDOSCOPY;  Service: Endoscopy;  Laterality: N/A;   Family History  Problem Relation Age of Onset  . Emphysema Father   . Asthma Son   . Clotting disorder Mother   . Rheum arthritis Sister   . Rheum arthritis Brother   . Ovarian cancer Sister    Social History  Substance Use Topics  . Smoking status: Former Smoker -- 1.00 packs/day for 39 years    Types: Cigarettes    Quit date: 01/30/2014  . Smokeless tobacco: Never Used     Comment: has not smoked in 6 days 07/27/14  . Alcohol Use: No   OB History    No data available     Review of Systems  Respiratory: Negative for shortness of breath.   Cardiovascular: Negative for chest pain.  Gastrointestinal: Negative for abdominal pain.  Neurological: Negative for headaches.  Hematological: Negative for adenopathy. Does not bruise/bleed easily.  All other systems reviewed and are negative.     Allergies  Oxycodone-acetaminophen; Codeine; Hydrocodone; and Quinolones  Home Medications   Prior to Admission medications   Medication Sig Start Date End Date Taking? Authorizing Provider  albuterol (PROVENTIL) (2.5 MG/3ML) 0.083% nebulizer solution Take 3 mLs (2.5 mg total) by nebulization every 6 (six) hours as needed for shortness of breath. For shortness of breath 12/20/13   Alison Murray, MD  albuterol (VENTOLIN HFA) 108 (90 BASE) MCG/ACT inhaler Inhale 2 puffs into the lungs every 6 (six) hours as needed for wheezing or shortness of breath.     Historical Provider, MD  Cholecalciferol (VITAMIN D-3) 5000 UNITS TABS  Take 3,000 Units by mouth daily.     Historical Provider, MD  diazepam (VALIUM) 5 MG tablet Take 2 mg by mouth every 8 (eight) hours. Reported on 05/14/2015 07/13/14   Historical Provider, MD  dicyclomine (BENTYL) 10 MG capsule Take 1 cap by mouth every 4-6 hrs PRN cramps 04/25/14   Hart Carwin, MD  Fluticasone Furoate (ARNUITY ELLIPTA) 100 MCG/ACT AEPB Inhale 1 puff into the lungs daily. 07/24/15   Kalman Shan, MD  Guaifenesin 1200 MG TB12 Take 1 tablet (1,200 mg total) by mouth 2 (two) times daily at 10 AM and 5 PM. 07/14/14   Linwood Dibbles, MD  hydrochlorothiazide (MICROZIDE) 12.5 MG capsule Take 1 capsule (12.5 mg total) by mouth daily. Patient taking differently: Take 12.5 mg by mouth daily as needed.  12/05/14   Chilton Si, MD  ipratropium-albuterol (DUONEB) 0.5-2.5 (3) MG/3ML SOLN Take 3 mLs by nebulization 4 (four) times daily. 05/14/15   Kalman Shan, MD  lactose free nutrition (BOOST PLUS) LIQD Take 237 mLs by mouth 2 (two) times daily between meals. 04/03/13   Nishant Dhungel, MD  mirtazapine (REMERON) 45 MG tablet Take 45 mg by mouth at bedtime. 03/28/14   Historical Provider, MD  mometasone (NASONEX) 50 MCG/ACT nasal spray Place 2 sprays into the nose daily. 01/04/14   Loren Racer, MD  mometasone-formoterol [facility]) 200-5 MCG/ACT AERO Inhale 2 puffs into the lungs 2 (two) times daily. 12/27/14   Tammy S Parrett, NP  oxyCODONE (ROXICODONE) 15 MG immediate release tablet Take 15 mg by mouth as needed for pain.  04/24/14   Historical Provider, MD  pantoprazole (PROTONIX) 40 MG tablet Take 1 tablet (40 mg total) by mouth daily at 6 (six) AM. Patient taking differently: Take 40 mg by mouth daily as needed.  11/02/12   Dorothea Ogle, MD  promethazine (PHENERGAN) 25 MG tablet Take 0.5-1 tablets (12.5-25 mg total) by mouth every 6 (six) hours as needed for nausea. 12/20/13   Alison Murray, MD  Tiotropium Bromide-Olodaterol (STIOLTO RESPIMAT) 2.5-2.5 MCG/ACT AERS Inhale 2 puffs into the lungs  daily. 07/09/15   Kalman Shan, MD  VITAMIN E PO Take 1 capsule by mouth daily.    Historical Provider, MD   BP 128/65 mmHg  Pulse 71  Temp(Src) 97.6 F (36.4 C) (Axillary)  Resp 17  SpO2 94%  LMP 05/29/2010   Physical Exam  Constitutional: She is oriented to person, place, and time. She appears well-developed and well-nourished. No distress.  HENT:  Head: Normocephalic and atraumatic. Head is without raccoon's eyes and without Battle's sign.    Right Ear: No hemotympanum.  Left Ear: No hemotympanum.  Mouth/Throat: Oropharynx is clear and moist.  Old bruising noted to left eyebrow  Eyes: Conjunctivae and EOM are normal.  pinpoint  Neck: Normal range of motion. Neck supple. No tracheal deviation present.  Cardiovascular: Normal rate, regular rhythm, normal heart sounds and intact distal pulses.   Pulmonary/Chest: Effort normal and breath sounds normal. No respiratory distress. She has no wheezes. She has no rales. Chest wall is not dull to percussion. She exhibits no tenderness, no bony tenderness, no crepitus, no edema, no deformity and no retraction.  Abdominal: Soft. Bowel sounds are normal. There is no tenderness. There is no rigidity, no rebound, no guarding, no CVA tenderness, no tenderness at McBurney's point and negative Murphy's sign.    Musculoskeletal: Normal range of motion.       Right wrist: Normal.       Left wrist: Normal.       Right hip: Normal.       Left hip: Normal.       Right knee: Normal.       Left knee: Normal.       Right ankle: Normal.       Left ankle: Normal.       Cervical back: Normal.       Thoracic back: Normal.       Lumbar back: Normal.       Right hand: Normal. She exhibits normal capillary refill. Normal sensation noted. Normal strength noted.       Left hand: Normal. She exhibits normal capillary refill. Normal sensation noted. Normal strength noted.  2 inch left hip hematoma. No foreshortening with rotation of extremities.     Neurological: She is alert and oriented to person, place, and time.  Skin: Skin is warm and dry.  Psychiatric: She has a normal mood and affect. Her behavior is normal.  Nursing note and vitals reviewed.   ED Course  Procedures (including critical care time) DIAGNOSTIC STUDIES: Oxygen Saturation is 98% on RA, normal by my interpretation.    COORDINATION OF CARE: 3:08 AM-Discussed treatment plan with pt at bedside and pt agreed to plan.     Labs Review Labs Reviewed - No data to display  Imaging Review No results found.    EKG Interpretation None      MDM   Final diagnoses:  None   Filed Vitals:   08/01/15 0443 08/01/15 0447  BP: 111/78 111/78  Pulse: 75 60  Temp:    Resp: 18 11    Results for orders placed or performed during the hospital encounter of 01/02/15  Myocardial Perfusion Imaging  Result Value Ref Range   Rest HR 76 bpm   Rest BP 112/68 mmHg   Exercise duration (min)  min   Exercise duration (sec)  sec   Estimated workload  METS   Peak HR 100 bpm   Peak BP 115/58 mmHg   MPHR  bpm   Percent HR  %   RPE     LV sys vol 28 mL   TID 1.21    LV dias vol 72 mL   LHR     SSS 3    SRS 1    SDS 2    Dg Chest 2 View  08/01/2015  CLINICAL DATA:  Trip over oxygen tank this morning. Left upper chest pain and bruising. EXAM: CHEST  2 VIEW COMPARISON:  CT 05/22/2015 FINDINGS: Questioned rib fracture on shoulder radiograph not well seen. The lungs are hyperinflated with emphysema. Heart size and mediastinal contours are normal. No pneumothorax or pleural effusion. Calcified granuloma in the left upper lobe. No focal airspace disease. IMPRESSION:  1. Emphysema, no acute intrathoracic process. 2. The questioned rib fracture on shoulder radiograph is not well seen. Electronically Signed   By: Rubye Oaks M.D.   On: 08/01/2015 04:28   Ct Head Wo Contrast  08/01/2015  CLINICAL DATA:  Trip over oxygen tubing resulting in fall. Head injury with hematoma left  periorbital region. Now with headache. EXAM: CT HEAD WITHOUT CONTRAST CT CERVICAL SPINE WITHOUT CONTRAST TECHNIQUE: Multidetector CT imaging of the head and cervical spine was performed following the standard protocol without intravenous contrast. Multiplanar CT image reconstructions of the cervical spine were also generated. COMPARISON:  Head CT 10/13/2012 FINDINGS: CT HEAD FINDINGS No intracranial hemorrhage, mass effect, or midline shift. No hydrocephalus. The basilar cisterns are patent. No evidence of territorial infarct. No intracranial fluid collection. Left periorbital edema without subjacent fracture. Calvarium is intact. Included paranasal sinuses and mastoid air cells are well aerated. CT CERVICAL SPINE FINDINGS No fracture or acute subluxation. The dens is intact. There are no jumped or perched facets. Disc space narrowing at C4-C5 and C5-C6 with endplate spurring. Minimal anterolisthesis of C4 on C5 appears degenerative. There is scattered facet arthropathy, left greater than right. No prevertebral soft tissue edema. Emphysema and scarring at the lung apices. IMPRESSION: 1.  No acute intracranial abnormality. 2. Degenerative change in the cervical spine without acute fracture or subluxation. Electronically Signed   By: Rubye Oaks M.D.   On: 08/01/2015 05:03   Ct Cervical Spine Wo Contrast  08/01/2015  CLINICAL DATA:  Trip over oxygen tubing resulting in fall. Head injury with hematoma left periorbital region. Now with headache. EXAM: CT HEAD WITHOUT CONTRAST CT CERVICAL SPINE WITHOUT CONTRAST TECHNIQUE: Multidetector CT imaging of the head and cervical spine was performed following the standard protocol without intravenous contrast. Multiplanar CT image reconstructions of the cervical spine were also generated. COMPARISON:  Head CT 10/13/2012 FINDINGS: CT HEAD FINDINGS No intracranial hemorrhage, mass effect, or midline shift. No hydrocephalus. The basilar cisterns are patent. No evidence of  territorial infarct. No intracranial fluid collection. Left periorbital edema without subjacent fracture. Calvarium is intact. Included paranasal sinuses and mastoid air cells are well aerated. CT CERVICAL SPINE FINDINGS No fracture or acute subluxation. The dens is intact. There are no jumped or perched facets. Disc space narrowing at C4-C5 and C5-C6 with endplate spurring. Minimal anterolisthesis of C4 on C5 appears degenerative. There is scattered facet arthropathy, left greater than right. No prevertebral soft tissue edema. Emphysema and scarring at the lung apices. IMPRESSION: 1.  No acute intracranial abnormality. 2. Degenerative change in the cervical spine without acute fracture or subluxation. Electronically Signed   By: Rubye Oaks M.D.   On: 08/01/2015 05:03   Dg Shoulder Left  08/01/2015  CLINICAL DATA:  Trip over oxygen tank this morning. Left shoulder and upper chest pain. EXAM: LEFT SHOULDER - 2+ VIEW COMPARISON:  None. FINDINGS: No acute fracture or dislocation of the shoulder. Acromioclavicular alignment is maintained. There is a probable nondisplaced fracture of anterior left tentatively identified third rib. IMPRESSION: 1. No fracture or subluxation of the shoulder. 2. Probable nondisplaced left anterior third rib fracture. Electronically Signed   By: Rubye Oaks M.D.   On: 08/01/2015 04:26   Dg Hand Complete Left  08/01/2015  CLINICAL DATA:  Trip over oxygen tank this morning. Left hand pain and bruising. EXAM: LEFT HAND - COMPLETE 3+ VIEW COMPARISON:  None. FINDINGS: There is no evidence of fracture or dislocation. There is no evidence of arthropathy or other  focal bone abnormality. Soft tissues are unremarkable. IMPRESSION: Negative radiographs of the left hand. Electronically Signed   By: Rubye Oaks M.D.   On: 08/01/2015 04:29   Dg Hip Unilat With Pelvis 2-3 Views Left  08/01/2015  CLINICAL DATA:  Trip over oxygen tank this morning. Now with left hip pain bruising. EXAM:  DG HIP (WITH OR WITHOUT PELVIS) 2-3V LEFT COMPARISON:  None. FINDINGS: The cortical margins of the bony pelvis and left hip are intact. No fracture. Pubic symphysis and sacroiliac joints are congruent. Both femoral heads are well-seated in the respective acetabula. The bones are under mineralized. IMPRESSION: No pelvic or left hip fracture. Electronically Signed   By: Rubye Oaks M.D.   On: 08/01/2015 04:24   Medications  sodium chloride 0.9 % bolus 2,000 mL (0 mLs Intravenous Stopped 08/01/15 0548)  fentaNYL (SUBLIMAZE) injection 100 mcg (100 mcg Intravenous Given 08/01/15 0504)  ketorolac (TORADOL) 30 MG/ML injection 30 mg (30 mg Intravenous Given 08/01/15 0600)    No fractures the area seen on shoulder XR is not seen on cxr and moreover appears to be a lung marking.  Patient is receiving 136 OxyIR 15 mg she has 4 days left on her current RX that she is getting at exactly the 34 day mark.  She told me and my scribe that she is not on any pain meds and states she anaphylaxes to percocet but strangely The DEA database demonstrates she is getting OxyIR, a medication she is already on.  She was given fentanyl as it is synthetic pending her xrays.  Though she asked for demerol multiple times.  Her bruises are old.  I suspect she has overused her OxyIR.  She is clearly drug seeking and was offered mobic and robaxin as she should have narcotics at home but left without her paperwork   I personally performed the services described in this documentation, which was scribed in my presence. The recorded information has been reviewed and is accurate.        Cy Blamer, MD 08/01/15 [phone]

## 2015-08-01 NOTE — ED Notes (Signed)
Called to pt's room to assist pt w/ voiding.  Gave pt the option of bed pan vs wheelchair to the bathroom.  Pt chose to use the bedpan.  Johny Drilling, NT and myself assisted pt onto the bedpan and asked if she would like some privacy.  Pt stated that she would like privacy so Johny Drilling, NT and myself left the room w/ call button in reach of pt.  Within one minute pt began screaming, Johny Drilling, NT and myself rushed into room and pt began cursing at both of [place] saying, "you put the bedpan on wrong and I pissed all over myself."  Assured pt that the chux had caught the urine and [place], NT placed a larger bedpan under pt.  Johny Drilling, NT and myself both remained calm and professional while the pt accused [place] of, "rolling our eyes" at her as well as "Leaving me in piss."  Dr. Nicanor Alcon is aware of situation.

## 2015-08-01 NOTE — ED Notes (Addendum)
Writer assisted with RN to help pt on the bedpan, staff members gave pt some privacy while using the bedpan, call bell bedside pt, pt called out with call bell and screaming in the room that "that staff members put the bed on wrong, and that she pissed all over the bed."  Writer observed that bedpan was on the correct way, and urine was not leaking all over the bed.  Writer informed pt that the bedpan was on correctly and she was dry as a bone.  Staff than proceeded to help pt back onto the bedpan.  Pt proceeded to state that staff members where rolling your eyes.

## 2015-08-01 NOTE — Telephone Encounter (Signed)
Spoke with pt, aware of recs.  rx sent to preferred pharmacy.  Nothing further needed.  

## 2015-08-01 NOTE — Discharge Instructions (Signed)

## 2015-08-01 NOTE — ED Notes (Signed)
Pt refusing to wear the BP cuff until her IVF is finished.  States left arm is a restricted extremity.  Band placed on left arm.

## 2015-08-01 NOTE — ED Notes (Signed)
Patient presents for fall, unsure of LOC, reports hitting head on floor, hematoma noted to left eyebrow, bruising noted to left hip. C/o HA, left hip pain, left shoulder pain, left arm pain. No anticoagulant therapy.

## 2015-08-02 ENCOUNTER — Telehealth: Payer: Self-pay | Admitting: Internal Medicine

## 2015-08-02 NOTE — Telephone Encounter (Signed)
Spoke with pt and she states that she was seen in the ED 08/01/15 for a fall where she hit her head, shoulder and hip. She was given Robaxin and Mobic and does not want to take these without pulmonary approval. She is afraid that they will suppress her breathing even further.   Per ED notes from visit 08/01/15:  No fractures the area seen on shoulder XR is not seen on cxr and moreover appears to be a lung marking. Patient is receiving 136 OxyIR 15 mg she has 4 days left on her current RX that she is getting at exactly the 34 day mark. She told me and my scribe that she is not on any pain meds and states she anaphylaxes to percocet but strangely The DEA database demonstrates she is getting OxyIR, a medication she is already on. She was given fentanyl as it is synthetic pending her xrays. Though she asked for demerol multiple times. Her bruises are old. I suspect she has overused her OxyIR. She is clearly drug seeking and was offered mobic and robaxin as she should have narcotics at home but left without her paperwork.  April Palumbo, MD 08/01/15 0645  MR is off today after 11pm [facility] last night.  MW pt would like to know if it is ok for her to take the Robaxin and Mobic that she was given. Please advise. Thanks!   LOV  07/09/15   Patient Instructions       ICD-9-CM ICD-10-CM   1. COPD, very severe (HCC) 496 J44.9   2. Nodule of left lung 793.11 R91.1   3. History of smoking V15.82 Z72.0     #COPD  Currently stable disease I understand your desire not taking inhalers due to this choking you Stop duoneb and dulera/qvar Start stiolto daily Start pulmicort nebulizer 0.25mg  twice daily Use albueterol as needed Repeat PFT in 3 months  #smoking - glad is under remission  #nodule - this is calcified in spirng 2017 and not cancer - will consider you in future for annual low dose CT  #Followup 3 months or sooner if needed

## 2015-08-02 NOTE — Telephone Encounter (Signed)
Spoke with pt and gave MW's recommendations. Pt voiced understanding. Nothing further needed.

## 2015-08-02 NOTE — Telephone Encounter (Signed)
Ok to take but take mobic with food

## 2015-08-19 ENCOUNTER — Encounter (HOSPITAL_COMMUNITY): Payer: Self-pay | Admitting: Emergency Medicine

## 2015-08-19 ENCOUNTER — Inpatient Hospital Stay (HOSPITAL_COMMUNITY)
Admission: EM | Admit: 2015-08-19 | Discharge: 2015-08-22 | DRG: 536 | Disposition: A | Discharge status: Home / Self Care | Payer: Medicaid Other | Attending: Internal Medicine | Admitting: Internal Medicine

## 2015-08-19 ENCOUNTER — Emergency Department (HOSPITAL_COMMUNITY): Payer: Medicaid Other

## 2015-08-19 ENCOUNTER — Other Ambulatory Visit: Payer: Self-pay

## 2015-08-19 DIAGNOSIS — S72001A Fracture of unspecified part of neck of right femur, initial encounter for closed fracture: Secondary | ICD-10-CM | POA: Diagnosis not present

## 2015-08-19 DIAGNOSIS — F4310 Post-traumatic stress disorder, unspecified: Secondary | ICD-10-CM | POA: Diagnosis present

## 2015-08-19 DIAGNOSIS — S72011A Unspecified intracapsular fracture of right femur, initial encounter for closed fracture: Secondary | ICD-10-CM | POA: Diagnosis not present

## 2015-08-19 DIAGNOSIS — R0902 Hypoxemia: Secondary | ICD-10-CM | POA: Diagnosis present

## 2015-08-19 DIAGNOSIS — W010XXA Fall on same level from slipping, tripping and stumbling without subsequent striking against object, initial encounter: Secondary | ICD-10-CM | POA: Diagnosis present

## 2015-08-19 DIAGNOSIS — Z8673 Personal history of transient ischemic attack (TIA), and cerebral infarction without residual deficits: Secondary | ICD-10-CM

## 2015-08-19 DIAGNOSIS — J449 Chronic obstructive pulmonary disease, unspecified: Secondary | ICD-10-CM | POA: Diagnosis present

## 2015-08-19 DIAGNOSIS — N390 Urinary tract infection, site not specified: Secondary | ICD-10-CM | POA: Diagnosis present

## 2015-08-19 DIAGNOSIS — G8929 Other chronic pain: Secondary | ICD-10-CM | POA: Diagnosis present

## 2015-08-19 DIAGNOSIS — Z7951 Long term (current) use of inhaled steroids: Secondary | ICD-10-CM | POA: Diagnosis not present

## 2015-08-19 DIAGNOSIS — M797 Fibromyalgia: Secondary | ICD-10-CM | POA: Diagnosis present

## 2015-08-19 DIAGNOSIS — M25551 Pain in right hip: Secondary | ICD-10-CM | POA: Diagnosis present

## 2015-08-19 DIAGNOSIS — Z87891 Personal history of nicotine dependence: Secondary | ICD-10-CM | POA: Diagnosis not present

## 2015-08-19 DIAGNOSIS — K219 Gastro-esophageal reflux disease without esophagitis: Secondary | ICD-10-CM | POA: Diagnosis present

## 2015-08-19 DIAGNOSIS — D509 Iron deficiency anemia, unspecified: Secondary | ICD-10-CM | POA: Diagnosis present

## 2015-08-19 DIAGNOSIS — Z9981 Dependence on supplemental oxygen: Secondary | ICD-10-CM

## 2015-08-19 DIAGNOSIS — F319 Bipolar disorder, unspecified: Secondary | ICD-10-CM | POA: Diagnosis present

## 2015-08-19 LAB — CBC WITH DIFFERENTIAL/PLATELET
BASOS ABS: 0 10*3/uL (ref 0.0–0.1)
BASOS PCT: 0 %
EOS PCT: 0 %
Eosinophils Absolute: 0 10*3/uL (ref 0.0–0.7)
HCT: 43.9 % (ref 36.0–46.0)
Hemoglobin: 14.8 g/dL (ref 12.0–15.0)
LYMPHS PCT: 12 %
Lymphs Abs: 1.4 10*3/uL (ref 0.7–4.0)
MCH: 30.1 pg (ref 26.0–34.0)
MCHC: 33.7 g/dL (ref 30.0–36.0)
MCV: 89.4 fL (ref 78.0–100.0)
Monocytes Absolute: 0.5 10*3/uL (ref 0.1–1.0)
Monocytes Relative: 5 %
NEUTROS ABS: 9.4 10*3/uL — AB (ref 1.7–7.7)
Neutrophils Relative %: 83 %
PLATELETS: 264 10*3/uL (ref 150–400)
RBC: 4.91 MIL/uL (ref 3.87–5.11)
RDW: 13.9 % (ref 11.5–15.5)
WBC: 11.4 10*3/uL — AB (ref 4.0–10.5)

## 2015-08-19 LAB — URINALYSIS, ROUTINE W REFLEX MICROSCOPIC
BILIRUBIN URINE: NEGATIVE
GLUCOSE, UA: NEGATIVE mg/dL
HGB URINE DIPSTICK: NEGATIVE
KETONES UR: NEGATIVE mg/dL
NITRITE: NEGATIVE
PH: 7 (ref 5.0–8.0)
Protein, ur: NEGATIVE mg/dL
Specific Gravity, Urine: 1.008 (ref 1.005–1.030)

## 2015-08-19 LAB — BASIC METABOLIC PANEL
ANION GAP: 8 (ref 5–15)
BUN: 11 mg/dL (ref 6–20)
CALCIUM: 10.2 mg/dL (ref 8.9–10.3)
CO2: 29 mmol/L (ref 22–32)
Chloride: 100 mmol/L — ABNORMAL LOW (ref 101–111)
Creatinine, Ser: 0.66 mg/dL (ref 0.44–1.00)
GFR calc Af Amer: >60 mL/min (ref >60)
Glucose, Bld: 105 mg/dL — ABNORMAL HIGH (ref 65–99)
POTASSIUM: 4.3 mmol/L (ref 3.5–5.1)
SODIUM: 137 mmol/L (ref 135–145)

## 2015-08-19 LAB — URINE MICROSCOPIC-ADD ON

## 2015-08-19 LAB — PROTIME-INR
INR: 0.95 (ref 0.00–1.49)
Prothrombin Time: 12.5 seconds (ref 11.6–15.2)

## 2015-08-19 LAB — TYPE AND SCREEN
ABO/RH(D): A NEG
ANTIBODY SCREEN: NEGATIVE

## 2015-08-19 MED ORDER — ONDANSETRON HCL 4 MG/2ML IJ SOLN
4.0000 mg | Freq: Once | INTRAMUSCULAR | Status: AC
  Administered 2015-08-19: 4 mg via INTRAVENOUS
  Filled 2015-08-19: qty 2

## 2015-08-19 MED ORDER — DEXTROSE-NACL 5-0.45 % IV SOLN: INTRAVENOUS | Status: DC

## 2015-08-19 MED ORDER — LORAZEPAM 0.5 MG PO TABS
0.5000 mg | ORAL_TABLET | Freq: Once | ORAL | Status: DC
  Filled 2015-08-19: qty 1

## 2015-08-19 MED ORDER — SODIUM CHLORIDE 0.9 % IV SOLN
INTRAVENOUS | Status: DC
  Administered 2015-08-19 – 2015-08-22 (×4): via INTRAVENOUS

## 2015-08-19 MED ORDER — HYDROMORPHONE HCL 1 MG/ML IJ SOLN
0.5000 mg | INTRAMUSCULAR | Status: DC | PRN
  Administered 2015-08-19 – 2015-08-20 (×2): 0.5 mg via INTRAVENOUS
  Filled 2015-08-19 (×2): qty 1

## 2015-08-19 MED ORDER — ALBUTEROL SULFATE (2.5 MG/3ML) 0.083% IN NEBU: 3.0000 mL | INHALATION_SOLUTION | Freq: Four times a day (QID) | RESPIRATORY_TRACT | Status: DC | PRN

## 2015-08-19 MED ORDER — FENTANYL CITRATE (PF) 100 MCG/2ML IJ SOLN
100.0000 ug | INTRAMUSCULAR | Status: AC | PRN
  Administered 2015-08-19 (×2): 100 ug via INTRAVENOUS
  Filled 2015-08-19 (×2): qty 2

## 2015-08-19 MED ORDER — VITAMIN D 1000 UNITS PO TABS
3000.0000 [IU] | ORAL_TABLET | Freq: Every day | ORAL | Status: DC
Start: 2015-08-19 — End: 2015-08-22
  Administered 2015-08-20 – 2015-08-22 (×3): 3000 [IU] via ORAL
  Filled 2015-08-19 (×4): qty 3

## 2015-08-19 MED ORDER — ENOXAPARIN SODIUM 40 MG/0.4ML ~~LOC~~ SOLN
40.0000 mg | SUBCUTANEOUS | Status: DC
Start: 2015-08-19 — End: 2015-08-22
  Administered 2015-08-19 – 2015-08-20 (×2): 40 mg via SUBCUTANEOUS
  Filled 2015-08-19 (×3): qty 0.4

## 2015-08-19 MED ORDER — MORPHINE SULFATE (PF) 2 MG/ML IV SOLN: 2.0000 mg | INTRAVENOUS | Status: DC | PRN

## 2015-08-19 MED ORDER — PANTOPRAZOLE SODIUM 40 MG PO TBEC
40.0000 mg | DELAYED_RELEASE_TABLET | Freq: Every day | ORAL | Status: DC
  Administered 2015-08-21 – 2015-08-22 (×2): 40 mg via ORAL
  Filled 2015-08-19 (×2): qty 1

## 2015-08-19 MED ORDER — FLUTICASONE PROPIONATE HFA 110 MCG/ACT IN AERO
2.0000 | INHALATION_SPRAY | Freq: Two times a day (BID) | RESPIRATORY_TRACT | Status: DC
  Administered 2015-08-20 – 2015-08-22 (×5): 2 via RESPIRATORY_TRACT
  Filled 2015-08-19: qty 12

## 2015-08-19 MED ORDER — HYDROMORPHONE HCL 1 MG/ML IJ SOLN
1.0000 mg | Freq: Once | INTRAMUSCULAR | Status: AC
  Administered 2015-08-19: 1 mg via INTRAVENOUS
  Filled 2015-08-19: qty 1

## 2015-08-19 MED ORDER — SODIUM CHLORIDE 0.9 % IV BOLUS (SEPSIS)
1000.0000 mL | Freq: Once | INTRAVENOUS | Status: AC
  Administered 2015-08-19: 1000 mL via INTRAVENOUS

## 2015-08-19 MED ORDER — FLUTICASONE PROPIONATE 50 MCG/ACT NA SUSP
2.0000 | Freq: Every day | NASAL | Status: DC
  Administered 2015-08-20 – 2015-08-21 (×2): 2 via NASAL
  Filled 2015-08-19: qty 16

## 2015-08-19 MED ORDER — MORPHINE SULFATE (PF) 4 MG/ML IV SOLN
4.0000 mg | Freq: Once | INTRAVENOUS | Status: AC
  Administered 2015-08-19: 4 mg via INTRAVENOUS
  Filled 2015-08-19: qty 1

## 2015-08-19 MED ORDER — GUAIFENESIN ER 600 MG PO TB12: 1200.0000 mg | ORAL_TABLET | Freq: Two times a day (BID) | ORAL | Status: DC | PRN

## 2015-08-19 MED ORDER — SODIUM CHLORIDE 0.9 % IV SOLN
Freq: Once | INTRAVENOUS | Status: AC
  Administered 2015-08-19: 150 mL/h via INTRAVENOUS

## 2015-08-19 MED ORDER — VALACYCLOVIR HCL 500 MG PO TABS
1000.0000 mg | ORAL_TABLET | Freq: Once | ORAL | Status: DC
  Filled 2015-08-19: qty 2

## 2015-08-19 NOTE — H&P (Signed)
History and Physical  [name] BWI:[id number] DOB: 02-05-1961 DOA: 08/19/2015  PCP:  Kaleen Mask, MD   Chief Complaint:  Right hip pain   History of Present Illness:  Patient is a 55 year old female with history of TIA, GERD, Depression, fibromylagia, lung abscess and COPD on home oxygen who came with cc of fall and right hip pain. She said she was with her neighbor and was trying to hold a cat from being around their baby when she tripped and fell on her right side. She denied any complaints of CP, dyspnea, dizziness or focal sensory/motor deficits prior to that. She had a fall on June 1st as well , also mechanical as she tripped while she was wearing oxygen. She has no complaints except for right hip pain today.   Review of Systems:  CONSTITUTIONAL:     No night sweats.  No fatigue.  No fever. No chills. Eyes:                            No visual changes.  No eye pain.  No eye discharge.   ENT:                              No epistaxis.  No sinus pain.  No sore throat.   No congestion. RESPIRATORY:           No cough.  No wheeze.  No hemoptysis.  No dyspnea CARDIOVASCULAR   :  No chest pains.  No palpitations. GASTROINTESTINAL:  No abdominal pain.  No nausea. No vomiting.  No diarrhea. No  constipation.  No hematemesis.  No hematochezia.  No melena. GENITOURINARY:      No urgency.  No frequency.  No dysuria.  No hematuria.  No obstructive symptoms.  No discharge.  No pain.   MUSCULOSKELETAL:  +musculoskeletal pain.  No joint swelling.  No arthritis. NEUROLOGICAL:        No confusion.  No weakness. No headache. No seizure. PSYCHIATRIC:             +depression. No anxiety. No suicidal ideation. SKIN:                             No rashes.  No lesions.  No wounds. ENDOCRINE:                +weight loss.  No polydipsia.  No polyuria.  No polyphagia. HEMATOLOGIC:           No purpura.  No petechiae.  No bleeding.  ALLERGIC                 : No pruritus.  No  angioedema Other:  Past Medical and Surgical History:   Past Medical History  Diagnosis Date  . TIA (transient ischemic attack)   . GERD (gastroesophageal reflux disease)   . Hepatomegaly     hx  . Colonic inertia   . Lung abscess (HCC)     "calcified over"; bilateral   . COPD (chronic obstructive pulmonary disease) (HCC)   . Stroke [facility]) 2009    TIA  . PTSD (post-traumatic stress disorder)   . Bipolar disorder (HCC)   . Depression   . Chronic headache   . Seizures (HCC)     in past  . Fibromyalgia   . Iron deficiency anemia   .  Arthritis   . Acute ischemic colitis (HCC) 11/01/2012  . Marijuana abuse 2014    positive screen in hospital  . TIA (transient ischemic attack)    Past Surgical History  Procedure Laterality Date  . 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 Cesarean section      x 2  . Esophagogastroduodenoscopy  12/23/2011    Procedure: ESOPHAGOGASTRODUODENOSCOPY (EGD);  Surgeon: Hart Carwin, MD;  Location: [facility];  Service: Endoscopy;  Laterality: N/A;  . Colonoscopy    . Colonoscopy N/A 11/01/2012    Procedure: COLONOSCOPY;  Surgeon: Iva Boop, MD;  Location: [facility];  Service: Endoscopy;  Laterality: N/A;    Social History:   reports that she quit smoking about 18 months ago. Her smoking use included Cigarettes. She has a 39 pack-year smoking history. She has never used smokeless tobacco. She reports that she does not drink alcohol or use illicit drugs.    Allergies  Allergen Reactions  . Oxycodone-Acetaminophen Anaphylaxis, Itching and Nausea And Vomiting    Tylox (Pt states, "I can only take Demerol, Oxycontin, and Dilaudid")  . Codeine Nausea And Vomiting  . Hydrocodone Nausea And Vomiting  . Quinolones Hives and Other (See Comments)    Damaged nerves and tendons; If have to take antibiotic, pt could take    Family History  Problem Relation Age of Onset  . Emphysema Father   . Asthma Son   .  Clotting disorder Mother   . Rheum arthritis Sister   . Rheum arthritis Brother   . Ovarian cancer Sister       Prior to Admission medications   Medication Sig Start Date End Date Taking? Authorizing Provider  albuterol (PROVENTIL) (2.5 MG/3ML) 0.083% nebulizer solution Take 3 mLs (2.5 mg total) by nebulization every 6 (six) hours as needed for shortness of breath. For shortness of breath 12/20/13  Yes Alison Murray, MD  albuterol (VENTOLIN HFA) 108 (90 BASE) MCG/ACT inhaler Inhale 2 puffs into the lungs every 6 (six) hours as needed for wheezing or shortness of breath.    Yes Historical Provider, MD  Cholecalciferol (VITAMIN D3) 3000 units TABS Take 3,000 Units by mouth daily.   Yes Historical Provider, MD  dicyclomine (BENTYL) 10 MG capsule Take 1 cap by mouth every 4-6 hrs PRN cramps 04/25/14  Yes Hart Carwin, MD  fluticasone (FLOVENT HFA) 110 MCG/ACT inhaler Inhale 2 puffs into the lungs 2 (two) times daily. 08/01/15  Yes Kalman Shan, MD  Guaifenesin 1200 MG TB12 Take 1 tablet (1,200 mg total) by mouth 2 (two) times daily at 10 AM and 5 PM. Patient taking differently: Take 1 tablet by mouth 2 (two) times daily as needed (cough).  07/14/14  Yes Linwood Dibbles, MD  hydrochlorothiazide (MICROZIDE) 12.5 MG capsule Take 1 capsule (12.5 mg total) by mouth daily. Patient taking differently: Take 12.5 mg by mouth daily as needed (swelling).  12/05/14  Yes Chilton Si, MD  lactose free nutrition (BOOST PLUS) LIQD Take 237 mLs by mouth 2 (two) times daily between meals. 04/03/13  Yes Nishant Dhungel, MD  mometasone (NASONEX) 50 MCG/ACT nasal spray Place 2 sprays into the nose daily. 01/04/14  Yes Loren Racer, MD  oxyCODONE (ROXICODONE) 15 MG immediate release tablet Take 15 mg by mouth every 6 (six) hours as needed for pain.  08/07/15  Yes Historical Provider, MD  pantoprazole (PROTONIX) 40 MG tablet Take 1 tablet (40  mg total) by mouth daily at 6 (six) AM. Patient taking differently: Take 40 mg by  mouth daily as needed (indigestion).  11/02/12  Yes Dorothea Ogle, MD  Tiotropium Bromide-Olodaterol (STIOLTO RESPIMAT) 2.5-2.5 MCG/ACT AERS Inhale 2 puffs into the lungs daily. 07/09/15  Yes Kalman Shan, MD  VITAMIN E PO Take 1 capsule by mouth daily.   Yes Historical Provider, MD  ipratropium-albuterol (DUONEB) 0.5-2.5 (3) MG/3ML SOLN Take 3 mLs by nebulization 4 (four) times daily. Patient not taking: Reported on 08/19/2015 05/14/15   Kalman Shan, MD  meloxicam (MOBIC) 15 MG tablet Take 1 tablet (15 mg total) by mouth daily. Patient not taking: Reported on 08/19/2015 08/01/15   April Palumbo, MD  methocarbamol (ROBAXIN) 500 MG tablet Take 1 tablet (500 mg total) by mouth 2 (two) times daily. Patient not taking: Reported on 08/19/2015 08/01/15   April Palumbo, MD    Physical Exam: BP 131/74 mmHg  Pulse 100  Temp(Src) 97.6 F (36.4 C) (Oral)  Resp 18  SpO2 94%  LMP 05/29/2010  GENERAL :   Alert and cooperative, and appears to be in mild acute distress. HEAD:           normocephalic. EYES:            PERRL, EOMI.  vision is grossly intact. EARS:           hearing grossly intact. NOSE:           No nasal discharge. THROAT:     Oral cavity and pharynx normal.   NECK:          supple, non-tender.  CARDIAC:    Normal S1 and S2. No gallop. No murmurs.  Vascular:     no peripheral edema. Extremities are warm and well perfused. No carotid bruits. LUNGS:       Clear to auscultation  ABDOMEN: Positive bowel sounds. Soft, nondistended, nontender.   MSK:           No joint erythema or tenderness.  EXT           : No significant deformity or joint abnormality. Neuro        : Alert, oriented to person, place, and time.                      CN II-XII intact.                       Right hip flexed , unable to move due to pain, w/o local tenderness. Limb looks shorter but w/o rotation.  SKIN:            No rash. No lesions. PSYCH:       No hallucination. Patient is not suicidal.          Labs  on Admission:  Reviewed.   Radiological Exams on Admission: Dg Chest 2 View  08/19/2015  CLINICAL DATA:  Status post fall, with concern for chest injury. Initial encounter. EXAM: CHEST  2 VIEW COMPARISON:  Chest radiograph performed 08/01/2015 FINDINGS: The lungs are hyperexpanded, with flattening of the hemidiaphragms, compatible with COPD. Peribronchial thickening is noted. Mild scarring is noted at the lung apices. There is no evidence of pleural effusion or pneumothorax. The cardiomediastinal silhouette is within normal limits. No acute osseous abnormalities are seen. There is chronic deformity of the left ninth posterolateral rib. IMPRESSION: 1. No acute cardiopulmonary process seen. 2. Findings of COPD, with mild scarring at the  lung apices. Electronically Signed   By: Roanna Raider M.D.   On: 08/19/2015 19:10   Dg Hip Unilat With Pelvis 2-3 Views Right  08/19/2015  CLINICAL DATA:  Status post fall, with right hip pain. Initial encounter. EXAM: DG HIP (WITH OR WITHOUT PELVIS) 2-3V RIGHT COMPARISON:  None. FINDINGS: There is a mildly comminuted subcapital fracture of the right femoral neck, with mild transcervical extension. Both femoral heads are seated normally within their respective acetabula. No significant degenerative change is appreciated. The sacroiliac joints are unremarkable in appearance. The visualized bowel gas pattern is grossly unremarkable in appearance. IMPRESSION: Mildly comminuted subcapital fracture of the right femoral neck, with mild transcervical extension. Electronically Signed   By: Roanna Raider M.D.   On: 08/19/2015 18:24    EKG:  Independently reviewed. Sinus tachy  Assessment/Plan  R.femoral neck fracture:  Mechanical fall Ortho consulted : asked to transfer patient to [facility] for possible surgery tmw.  Will keep NPO after MN Morphine prn pain   Possible CAD: CT chest with advanced coronary atherosclerosis in LAD. n No LHC  May need cardio for preop eval.    TIA: Not clear why not on aspirin or statin.  Lipid profile in am  Holding on asp for now due to possible surgery  Depression/ Bipolar:  Psych eval as outpatient. Not suicidal.   COPD:  Continue oxygen therapy and albuterol  Cont Flovent      Input & Output: NA Lines & Tubes: PIV DVT prophylaxis:  [place] enoxaparin  GI prophylaxis: PPI Consultants: Ortho  Code Status: Full Family Communication: at beside   Disposition Plan: MesSurg at [facility].    Eston Esters M.D [facility]

## 2015-08-19 NOTE — Consult Note (Signed)
I long discussion with the patient this morning. She complains of some pain in her right thigh and groin. She denies any numbness or tingling distally.  I discussed the risks and benefits of percutaneous pinning of her hip versus nonoperative treatment. She strongly desires to undergo nonoperative treatment. I did discuss the risk of shortening of the leg further collapse possible nonunion and need for arthroplasty. In light of this she would like to go forward with nonoperative treatment.   Plan:  PT to mobilize [facility] RLE   F/u in my office in 1-2 wks for repeat xray

## 2015-08-19 NOTE — ED Provider Notes (Signed)
CSN: [id number]     Arrival date & time 08/19/15  1605 History   First MD Initiated Contact with Patient 08/19/15 1630     Chief Complaint  Patient presents with  . Hip Injury     (Consider location/radiation/quality/duration/timing/severity/associated sxs/prior Treatment) HPI   [name] is a(n) 55 y.o. female who presents w cc of hip pain. Patient states that she was in the kitchen and her dog got scared and ran under her feet. She fell to the right and landed on her hip. She  denies hitting her head or LOC.  She has chronic pain and oxygen demand hypoxia secondary to a  Previous empyema. She continues to smoke. She takes chronic pain medication. She c/o severe pain in the R hip. Denies paresthesia. Unable to ambulate after fall.    Past Medical History  Diagnosis Date  . TIA (transient ischemic attack)   . GERD (gastroesophageal reflux disease)   . Hepatomegaly     hx  . Colonic inertia   . Lung abscess (HCC)     "calcified over"; bilateral   . COPD (chronic obstructive pulmonary disease) (HCC)   . Stroke [facility]) 2009    TIA  . PTSD (post-traumatic stress disorder)   . Bipolar disorder (HCC)   . Depression   . Chronic headache   . Seizures (HCC)     in past  . Fibromyalgia   . Iron deficiency anemia   . Arthritis   . Acute ischemic colitis (HCC) 11/01/2012  . Marijuana abuse 2014    positive screen in hospital  . TIA (transient ischemic attack)    Past Surgical History  Procedure Laterality Date  . 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 Cesarean section      x 2  . Esophagogastroduodenoscopy  12/23/2011    Procedure: ESOPHAGOGASTRODUODENOSCOPY (EGD);  Surgeon: Hart Carwin, MD;  Location: [facility];  Service: Endoscopy;  Laterality: N/A;  . Colonoscopy    . Colonoscopy N/A 11/01/2012    Procedure: COLONOSCOPY;  Surgeon: Iva Boop, MD;  Location: [facility];  Service: Endoscopy;  Laterality: N/A;   Family History   Problem Relation Age of Onset  . Emphysema Father   . Asthma Son   . Clotting disorder Mother   . Rheum arthritis Sister   . Rheum arthritis Brother   . Ovarian cancer Sister    Social History  Substance Use Topics  . Smoking status: Former Smoker -- 1.00 packs/day for 39 years    Types: Cigarettes    Quit date: 01/30/2014  . Smokeless tobacco: Never Used     Comment: has not smoked in 6 days 07/27/14  . Alcohol Use: No   OB History    No data available     Review of Systems Ten systems reviewed and are negative for acute change, except as noted in the HPI.     Allergies  Oxycodone-acetaminophen; Codeine; Hydrocodone; and Quinolones  Home Medications   Prior to Admission medications   Medication Sig Start Date End Date Taking? Authorizing Provider  albuterol (PROVENTIL) (2.5 MG/3ML) 0.083% nebulizer solution Take 3 mLs (2.5 mg total) by nebulization every 6 (six) hours as needed for shortness of breath. For shortness of breath 12/20/13  Yes Alison Murray, MD  albuterol (VENTOLIN HFA) 108 (90 BASE) MCG/ACT inhaler Inhale 2 puffs into the lungs every 6 (six) hours as needed for wheezing or shortness  of breath.    Yes Historical Provider, MD  Cholecalciferol (VITAMIN D3) 3000 units TABS Take 3,000 Units by mouth daily.   Yes Historical Provider, MD  dicyclomine (BENTYL) 10 MG capsule Take 1 cap by mouth every 4-6 hrs PRN cramps 04/25/14  Yes Hart Carwin, MD  fluticasone (FLOVENT HFA) 110 MCG/ACT inhaler Inhale 2 puffs into the lungs 2 (two) times daily. 08/01/15  Yes Kalman Shan, MD  Guaifenesin 1200 MG TB12 Take 1 tablet (1,200 mg total) by mouth 2 (two) times daily at 10 AM and 5 PM. Patient taking differently: Take 1 tablet by mouth 2 (two) times daily as needed (cough).  07/14/14  Yes Linwood Dibbles, MD  hydrochlorothiazide (MICROZIDE) 12.5 MG capsule Take 1 capsule (12.5 mg total) by mouth daily. Patient taking differently: Take 12.5 mg by mouth daily as needed (swelling).   12/05/14  Yes Chilton Si, MD  lactose free nutrition (BOOST PLUS) LIQD Take 237 mLs by mouth 2 (two) times daily between meals. 04/03/13  Yes Nishant Dhungel, MD  mometasone (NASONEX) 50 MCG/ACT nasal spray Place 2 sprays into the nose daily. 01/04/14  Yes Loren Racer, MD  oxyCODONE (ROXICODONE) 15 MG immediate release tablet Take 15 mg by mouth every 6 (six) hours as needed for pain.  08/07/15  Yes Historical Provider, MD  pantoprazole (PROTONIX) 40 MG tablet Take 1 tablet (40 mg total) by mouth daily at 6 (six) AM. Patient taking differently: Take 40 mg by mouth daily as needed (indigestion).  11/02/12  Yes Dorothea Ogle, MD  Tiotropium Bromide-Olodaterol (STIOLTO RESPIMAT) 2.5-2.5 MCG/ACT AERS Inhale 2 puffs into the lungs daily. 07/09/15  Yes Kalman Shan, MD  VITAMIN E PO Take 1 capsule by mouth daily.   Yes Historical Provider, MD  ipratropium-albuterol (DUONEB) 0.5-2.5 (3) MG/3ML SOLN Take 3 mLs by nebulization 4 (four) times daily. Patient not taking: Reported on 08/19/2015 05/14/15   Kalman Shan, MD  meloxicam (MOBIC) 15 MG tablet Take 1 tablet (15 mg total) by mouth daily. Patient not taking: Reported on 08/19/2015 08/01/15   April Palumbo, MD  methocarbamol (ROBAXIN) 500 MG tablet Take 1 tablet (500 mg total) by mouth 2 (two) times daily. Patient not taking: Reported on 08/19/2015 08/01/15   April Palumbo, MD   BP 127/71 mmHg  Pulse 94  Temp(Src) 97.6 F (36.4 C) (Oral)  Resp 18  SpO2 97%  LMP 05/29/2010 Physical Exam  Constitutional: She is oriented to person, place, and time. She appears well-developed and well-nourished. No distress.  HENT:  Head: Normocephalic and atraumatic.  Eyes: Conjunctivae are normal. No scleral icterus.  Neck: Normal range of motion.  Cardiovascular: Normal rate, regular rhythm and normal heart sounds.  Exam reveals no gallop and no friction rub.   No murmur heard. Pulmonary/Chest: Effort normal and breath sounds normal. No respiratory distress.   Abdominal: Soft. Bowel sounds are normal. She exhibits no distension and no mass. There is no tenderness. There is no guarding.  Musculoskeletal:  Legs appear well aligned. No shortening or rotation in the right hip. She has severe pain with any movement of the leg. Strong distal pulses. Wiggles toes.    Neurological: She is alert and oriented to person, place, and time.  Skin: Skin is warm and dry. She is not diaphoretic.  Nursing note and vitals reviewed.   ED Course  Procedures (including critical care time) Labs Review Labs Reviewed  BASIC METABOLIC PANEL - Abnormal; Notable for the following:    Chloride 100 (*)  Glucose, Bld 105 (*)    All other components within normal limits  CBC WITH DIFFERENTIAL/PLATELET - Abnormal; Notable for the following:    WBC 11.4 (*)    Neutro Abs 9.4 (*)    All other components within normal limits  URINALYSIS, ROUTINE W REFLEX MICROSCOPIC (NOT AT [facility]) - Abnormal; Notable for the following:    APPearance CLOUDY (*)    Leukocytes, UA LARGE (*)    All other components within normal limits  URINE MICROSCOPIC-ADD ON - Abnormal; Notable for the following:    Squamous Epithelial / LPF 6-30 (*)    Bacteria, UA FEW (*)    All other components within normal limits  PROTIME-INR  COMPREHENSIVE METABOLIC PANEL  CBC  APTT  LIPID PANEL  TYPE AND SCREEN    Imaging Review Dg Chest 2 View  08/19/2015  CLINICAL DATA:  Status post fall, with concern for chest injury. Initial encounter. EXAM: CHEST  2 VIEW COMPARISON:  Chest radiograph performed 08/01/2015 FINDINGS: The lungs are hyperexpanded, with flattening of the hemidiaphragms, compatible with COPD. Peribronchial thickening is noted. Mild scarring is noted at the lung apices. There is no evidence of pleural effusion or pneumothorax. The cardiomediastinal silhouette is within normal limits. No acute osseous abnormalities are seen. There is chronic deformity of the left ninth posterolateral rib.  IMPRESSION: 1. No acute cardiopulmonary process seen. 2. Findings of COPD, with mild scarring at the lung apices. Electronically Signed   By: Roanna Raider M.D.   On: 08/19/2015 19:10   Dg Hip Unilat With Pelvis 2-3 Views Right  08/19/2015  CLINICAL DATA:  Status post fall, with right hip pain. Initial encounter. EXAM: DG HIP (WITH OR WITHOUT PELVIS) 2-3V RIGHT COMPARISON:  None. FINDINGS: There is a mildly comminuted subcapital fracture of the right femoral neck, with mild transcervical extension. Both femoral heads are seated normally within their respective acetabula. No significant degenerative change is appreciated. The sacroiliac joints are unremarkable in appearance. The visualized bowel gas pattern is grossly unremarkable in appearance. IMPRESSION: Mildly comminuted subcapital fracture of the right femoral neck, with mild transcervical extension. Electronically Signed   By: Roanna Raider M.D.   On: 08/19/2015 18:24   I have personally reviewed and evaluated these images and lab results as part of my medical decision-making.   EKG Interpretation None      MDM   Final diagnoses:  None    .patient with subcapital hip fracture. Due to her multipel comorbidities she will need med clearance.  I have spoken with Dr. Eulah Pont who asks that we have the patient admitted to [facility] hospital. Dr. Ninfa Linden will admit for the Hospitalists. Pt stable in ED with no significant deterioration in condition.     Arthor Captain, PA-C 08/20/15 0005  Benjiman Core, MD 08/20/15 0010

## 2015-08-19 NOTE — ED Notes (Signed)
Patient transported to X-ray 

## 2015-08-19 NOTE — ED Notes (Signed)
Hospitalist at bedside 

## 2015-08-19 NOTE — ED Notes (Signed)
Report given to [facility] from [facility]

## 2015-08-19 NOTE — ED Notes (Signed)
Per EMS pt had fall in kitchen approx one hour ago. Pt tripped over pet and fell onto RIGHT hip. No shortening or rotation present. Pain is 10/10.

## 2015-08-20 ENCOUNTER — Encounter (HOSPITAL_COMMUNITY)
Admission: EM | Disposition: A | Discharge status: Home / Self Care | Payer: Self-pay | Source: Home / Self Care | Attending: Internal Medicine

## 2015-08-20 ENCOUNTER — Ambulatory Visit: Payer: Self-pay | Admitting: Gastroenterology

## 2015-08-20 DIAGNOSIS — M797 Fibromyalgia: Secondary | ICD-10-CM

## 2015-08-20 LAB — ABO/RH: ABO/RH(D): A NEG

## 2015-08-20 LAB — GLUCOSE, CAPILLARY: Glucose-Capillary: 122 mg/dL — ABNORMAL HIGH (ref 65–99)

## 2015-08-20 LAB — TYPE AND SCREEN
ABO/RH(D): A NEG
ANTIBODY SCREEN: NEGATIVE

## 2015-08-20 SURGERY — PINNING, EXTREMITY, PERCUTANEOUS
Anesthesia: General | Laterality: Right

## 2015-08-20 MED ORDER — DEXTROSE-NACL 5-0.45 % IV SOLN: 100.0000 mL/h | INTRAVENOUS | Status: AC

## 2015-08-20 MED ORDER — ONDANSETRON HCL 4 MG/2ML IJ SOLN
4.0000 mg | Freq: Four times a day (QID) | INTRAMUSCULAR | Status: DC | PRN
  Administered 2015-08-20 – 2015-08-21 (×2): 4 mg via INTRAVENOUS
  Filled 2015-08-20 (×2): qty 2

## 2015-08-20 MED ORDER — ENSURE ENLIVE PO LIQD
237.0000 mL | Freq: Two times a day (BID) | ORAL | Status: DC
  Administered 2015-08-21 – 2015-08-22 (×3): 237 mL via ORAL

## 2015-08-20 MED ORDER — PROMETHAZINE HCL 25 MG/ML IJ SOLN
12.5000 mg | Freq: Four times a day (QID) | INTRAMUSCULAR | Status: DC | PRN
  Administered 2015-08-20: 12.5 mg via INTRAVENOUS
  Filled 2015-08-20 (×2): qty 1

## 2015-08-20 MED ORDER — HYDROMORPHONE HCL 1 MG/ML IJ SOLN
1.0000 mg | INTRAMUSCULAR | Status: DC | PRN
  Administered 2015-08-20 – 2015-08-21 (×10): 1 mg via INTRAVENOUS
  Filled 2015-08-20 (×12): qty 1

## 2015-08-20 MED ORDER — DIAZEPAM 5 MG PO TABS
5.0000 mg | ORAL_TABLET | Freq: Three times a day (TID) | ORAL | Status: DC | PRN
  Administered 2015-08-20 – 2015-08-22 (×5): 5 mg via ORAL
  Filled 2015-08-20 (×5): qty 1

## 2015-08-20 MED ORDER — PROMETHAZINE HCL 25 MG RE SUPP: 12.5000 mg | Freq: Four times a day (QID) | RECTAL | Status: DC | PRN

## 2015-08-20 MED ORDER — OXYCODONE HCL 5 MG PO TABS
15.0000 mg | ORAL_TABLET | Freq: Four times a day (QID) | ORAL | Status: DC | PRN
  Administered 2015-08-21 – 2015-08-22 (×2): 15 mg via ORAL
  Filled 2015-08-20 (×3): qty 3

## 2015-08-20 MED ORDER — CETYLPYRIDINIUM CHLORIDE 0.05 % MT LIQD
7.0000 mL | Freq: Two times a day (BID) | OROMUCOSAL | Status: DC
  Administered 2015-08-20 – 2015-08-21 (×4): 7 mL via OROMUCOSAL

## 2015-08-20 MED ORDER — PROMETHAZINE HCL 25 MG PO TABS: 12.5000 mg | ORAL_TABLET | Freq: Four times a day (QID) | ORAL | Status: DC | PRN

## 2015-08-20 MED ORDER — OXYCODONE HCL ER 10 MG PO T12A
10.0000 mg | EXTENDED_RELEASE_TABLET | Freq: Two times a day (BID) | ORAL | Status: DC
  Administered 2015-08-20 – 2015-08-22 (×5): 10 mg via ORAL
  Filled 2015-08-20 (×5): qty 1

## 2015-08-20 MED ORDER — CEFAZOLIN SODIUM-DEXTROSE 2-4 GM/100ML-% IV SOLN: 2.0000 g | INTRAVENOUS | Status: AC

## 2015-08-20 MED ORDER — CHLORHEXIDINE GLUCONATE 4 % EX LIQD
60.0000 mL | Freq: Once | CUTANEOUS | Status: DC
  Filled 2015-08-20: qty 60

## 2015-08-20 MED ORDER — DIAZEPAM 2 MG PO TABS
1.0000 mg | ORAL_TABLET | Freq: Once | ORAL | Status: AC
  Administered 2015-08-20: 1 mg via ORAL
  Filled 2015-08-20: qty 1

## 2015-08-20 MED ORDER — POVIDONE-IODINE 10 % EX SWAB: 2.0000 "application " | Freq: Once | CUTANEOUS | Status: DC

## 2015-08-20 NOTE — Consult Note (Signed)
ORTHOPAEDIC CONSULTATION  REQUESTING PHYSICIAN: Joseph Art, DO  Chief Complaint: Right hip pain.  Assessment: Active Problems:   Femoral neck fracture, right, closed, initial encounter  History of TIA, GERD, Depression, fibromylagia, lung abscess and COPD on home oxygen.  Not compliant with NPO status and declining surgery at this time.   Plan: Dr. Eulah Pont will further discuss risks and benefits of surgery vs non-operative treatment.  Surgery would be percutaneous pinning. Weight Bearing Status: TDWB VTE px: per primary.  HPI: [name] is a 55 y.o. female with a history of TIA, GERD, Depression, fibromylagia, lung abscess and COPD on home oxygenwho complains of right hip pain in the afternoon yesterday.  She lost balance when trying to hold her cat.  No LOC.  Her pain is at a 10/10 now, but improved significantly from yesterday.  She denies hx of DVT, PE, MI.  Hx of TIA and Lung abscess - patient reports these as complications from medicines prescribed.  XR of pelvis shows: Mildly comminuted subcapital fracture of the right femoral neck, with mild transcervical extension.  Orthopedics was consulted for evaluation.  Past Medical History  Diagnosis Date  . TIA (transient ischemic attack)   . GERD (gastroesophageal reflux disease)   . Hepatomegaly     hx  . Colonic inertia   . Lung abscess (HCC)     "calcified over"; bilateral   . COPD (chronic obstructive pulmonary disease) (HCC)   . Stroke [facility]) 2009    TIA  . PTSD (post-traumatic stress disorder)   . Bipolar disorder (HCC)   . Depression   . Chronic headache   . Seizures (HCC)     in past  . Fibromyalgia   . Iron deficiency anemia   . Arthritis   . Acute ischemic colitis (HCC) 11/01/2012  . Marijuana abuse 2014    positive screen in hospital  . TIA (transient ischemic attack)    Past Surgical History  Procedure Laterality Date  . 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 Cesarean section      x 2  . Esophagogastroduodenoscopy  12/23/2011    Procedure: ESOPHAGOGASTRODUODENOSCOPY (EGD);  Surgeon: Hart Carwin, MD;  Location: [facility];  Service: Endoscopy;  Laterality: N/A;  . Colonoscopy    . Colonoscopy N/A 11/01/2012    Procedure: COLONOSCOPY;  Surgeon: Iva Boop, MD;  Location: [facility];  Service: Endoscopy;  Laterality: N/A;   Social History   Social History  . Marital Status: Married    Spouse Name: N/A  . Number of Children: 4  . Years of Education: N/A   Occupational History  . 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   Social History Main Topics  . Smoking status: Former Smoker -- 1.00 packs/day for 39 years    Types: Cigarettes    Quit date: 01/30/2014  . Smokeless tobacco: Never Used     Comment: has not smoked in 6 days 07/27/14  . Alcohol Use: No  . Drug Use: No     Comment: hx cannibus abuse  . Sexual Activity: No   Other Topics Concern  . None   Social History Narrative   Pt is divorced.   Pt has 4 sons: 2 alive and 2 deceased.   Hx of cannabis abuse   Family History  Problem Relation Age of Onset  . Emphysema Father   .  Asthma Son   . Clotting disorder Mother   . Rheum arthritis Sister   . Rheum arthritis Brother   . Ovarian cancer Sister    Allergies  Allergen Reactions  . Oxycodone-Acetaminophen Anaphylaxis, Itching and Nausea And Vomiting    Tylox (Pt states, "I can only take Demerol, Oxycontin, and Dilaudid")  Patient has oxycodone as a home med and denies history of anaphylaxis w/ Pain medicine.  She says that she can take Acetaminophen also.  . Ciprofloxacin Other (See Comments)    Lift threatening  . Codeine Nausea And Vomiting  . Hydrocodone Nausea And Vomiting  . Levaquin [Levofloxacin In D5w] Other (See Comments)    Life threatening  . Quinolones Hives and Other (See Comments)    Damaged nerves and tendons; If have to take antibiotic, pt could take   Prior to Admission  medications   Medication Sig Start Date End Date Taking? Authorizing Provider  albuterol (PROVENTIL) (2.5 MG/3ML) 0.083% nebulizer solution Take 3 mLs (2.5 mg total) by nebulization every 6 (six) hours as needed for shortness of breath. For shortness of breath 12/20/13  Yes Alison Murray, MD  albuterol (VENTOLIN HFA) 108 (90 BASE) MCG/ACT inhaler Inhale 2 puffs into the lungs every 6 (six) hours as needed for wheezing or shortness of breath.    Yes Historical Provider, MD  Cholecalciferol (VITAMIN D3) 3000 units TABS Take 3,000 Units by mouth daily.   Yes Historical Provider, MD  dicyclomine (BENTYL) 10 MG capsule Take 1 cap by mouth every 4-6 hrs PRN cramps 04/25/14  Yes Hart Carwin, MD  fluticasone (FLOVENT HFA) 110 MCG/ACT inhaler Inhale 2 puffs into the lungs 2 (two) times daily. 08/01/15  Yes Kalman Shan, MD  Guaifenesin 1200 MG TB12 Take 1 tablet (1,200 mg total) by mouth 2 (two) times daily at 10 AM and 5 PM. Patient taking differently: Take 1 tablet by mouth 2 (two) times daily as needed (cough).  07/14/14  Yes Linwood Dibbles, MD  hydrochlorothiazide (MICROZIDE) 12.5 MG capsule Take 1 capsule (12.5 mg total) by mouth daily. Patient taking differently: Take 12.5 mg by mouth daily as needed (swelling).  12/05/14  Yes Chilton Si, MD  lactose free nutrition (BOOST PLUS) LIQD Take 237 mLs by mouth 2 (two) times daily between meals. 04/03/13  Yes Nishant Dhungel, MD  mometasone (NASONEX) 50 MCG/ACT nasal spray Place 2 sprays into the nose daily. 01/04/14  Yes Loren Racer, MD  oxyCODONE (ROXICODONE) 15 MG immediate release tablet Take 15 mg by mouth every 6 (six) hours as needed for pain.  08/07/15  Yes Historical Provider, MD  pantoprazole (PROTONIX) 40 MG tablet Take 1 tablet (40 mg total) by mouth daily at 6 (six) AM. Patient taking differently: Take 40 mg by mouth daily as needed (indigestion).  11/02/12  Yes Dorothea Ogle, MD  Tiotropium Bromide-Olodaterol (STIOLTO RESPIMAT) 2.5-2.5 MCG/ACT  AERS Inhale 2 puffs into the lungs daily. 07/09/15  Yes Kalman Shan, MD  VITAMIN E PO Take 1 capsule by mouth daily.   Yes Historical Provider, MD  ipratropium-albuterol (DUONEB) 0.5-2.5 (3) MG/3ML SOLN Take 3 mLs by nebulization 4 (four) times daily. Patient not taking: Reported on 08/19/2015 05/14/15   Kalman Shan, MD  meloxicam (MOBIC) 15 MG tablet Take 1 tablet (15 mg total) by mouth daily. Patient not taking: Reported on 08/19/2015 08/01/15   April Palumbo, MD  methocarbamol (ROBAXIN) 500 MG tablet Take 1 tablet (500 mg total) by mouth 2 (two) times daily. Patient not taking: Reported  on 08/19/2015 08/01/15   April Palumbo, MD   Dg Chest 2 View  08/19/2015  CLINICAL DATA:  Status post fall, with concern for chest injury. Initial encounter. EXAM: CHEST  2 VIEW COMPARISON:  Chest radiograph performed 08/01/2015 FINDINGS: The lungs are hyperexpanded, with flattening of the hemidiaphragms, compatible with COPD. Peribronchial thickening is noted. Mild scarring is noted at the lung apices. There is no evidence of pleural effusion or pneumothorax. The cardiomediastinal silhouette is within normal limits. No acute osseous abnormalities are seen. There is chronic deformity of the left ninth posterolateral rib. IMPRESSION: 1. No acute cardiopulmonary process seen. 2. Findings of COPD, with mild scarring at the lung apices. Electronically Signed   By: Roanna Raider M.D.   On: 08/19/2015 19:10   Dg Hip Unilat With Pelvis 2-3 Views Right  08/19/2015  CLINICAL DATA:  Status post fall, with right hip pain. Initial encounter. EXAM: DG HIP (WITH OR WITHOUT PELVIS) 2-3V RIGHT COMPARISON:  None. FINDINGS: There is a mildly comminuted subcapital fracture of the right femoral neck, with mild transcervical extension. Both femoral heads are seated normally within their respective acetabula. No significant degenerative change is appreciated. The sacroiliac joints are unremarkable in appearance. The visualized bowel gas  pattern is grossly unremarkable in appearance. IMPRESSION: Mildly comminuted subcapital fracture of the right femoral neck, with mild transcervical extension. Electronically Signed   By: Roanna Raider M.D.   On: 08/19/2015 18:24   Positive ROS: All other systems have been reviewed and were otherwise negative with the exception of those mentioned in the HPI and as above.  Objective: Labs cbc  Recent Labs  08/19/15 1707  WBC 11.4*  HGB 14.8  HCT 43.9  PLT 264    Recent Labs  08/19/15 1707  INR 0.95     Recent Labs  08/19/15 1707  NA 137  K 4.3  CL 100*  CO2 29  GLUCOSE 105*  BUN 11  CREATININE 0.66  CALCIUM 10.2    Physical Exam: Filed Vitals:   08/19/15 2042 08/20/15 0624  BP: 127/71 105/44  Pulse: 94 84  Temp:    Resp: 18 15   General: Alert, Anxious appearing, no acute distress Cardiovascular: No pedal edema Respiratory: No cyanosis, no use of accessory musculature.  [place] in place. GI: No organomegaly, abdomen is soft and non-tender Skin: No lesions in the area of chief complaint other than those listed below in MSK exam.  Neurologic: Sensation intact distally save for the below mentioned MSK exam Psychiatric: Anxious appearing.  Speaks quickly. MUSCULOSKELETAL:  Right hip pain with movement.  NVI / sensation intact distally.  EHL/FHL/dorsiflextion/plantarflextion intact.   Albina Billet III PA-C 08/20/2015 7:36 AM

## 2015-08-20 NOTE — Progress Notes (Signed)
RN reminds patient to be nothing by mouth except meds with sips of water. Patient states "Well I'm not being rushed into surgery  tomorrow and I haven't eaten in 2 days so if I have to eat I'm going to eat. RN reminds patient of the importance to prep just incase for surgery. Patient verbalizes understanding. Will continue to assess situation.

## 2015-08-20 NOTE — Progress Notes (Signed)
PROGRESS NOTE    [name]  QIO:[id number] DOB: [birth year] DOA: 08/19/2015 PCP: Kaleen Mask, MD   Outpatient Specialists: Dr. Alan Ripper   Brief Narrative:  Patient is a 55 year old female with history of TIA, GERD, Depression, fibromylagia, lung abscess and COPD on home oxygen who came with cc of fall and right hip pain. She said she was with her neighbor and was trying to hold a cat from being around their baby when she tripped and fell on her right side. She denied any complaints of CP, dyspnea, dizziness or focal sensory/motor deficits prior to that. She had a fall on June 1st as well , also mechanical as she tripped while she was wearing oxygen. She has no complaints except for right hip pain today.  Has refused surgery x 2.     Assessment & Plan:   Active Problems:   Femoral neck fracture, right, closed, initial encounter   R.femoral neck fracture:  Mechanical fall Ortho consulted : patient currently refusing surgery -pain control -PT eval: toe touch on right   H/o TIA: Outpatient follow up  Depression/ Bipolar/personality d/o Psych eval as outpatient. Not suicidal.   Fibromyalgia -pain control will be an issue -added long acting and home PRN as well as IV  COPD:  Continue oxygen therapy and albuterol  Cont Flovent     Code Status: Full Code   Family Communication: Husband at bedside  Disposition Plan:     Consultants:   ortho  Procedures:     Antimicrobials:       Subjective: Upset about numerous issues dating back to 2010 including why she was prescribed cipro and "now my life has been ruined"   Objective: Filed Vitals:   08/19/15 1941 08/19/15 2042 08/20/15 0624 08/20/15 0850  BP: 129/65 127/71 105/44   Pulse: 103 94 84   Temp:      TempSrc:      Resp: SpO2: 91% 97% 95% 96%    Intake/Output Summary (Last 24 hours) at 08/20/15 1303 Last data filed at 08/20/15 0600  Gross per 24 hour  Intake   1120  ml  Output      0 ml  Net   1120 ml   There were no vitals filed for this visit.  Examination:  General exam: ranges from tearful to angry  Respiratory system: Clear to auscultation. Respiratory effort normal. Cardiovascular system: S1 & S2 heard, RRR. No JVD, murmurs, rubs, gallops or clicks. No pedal edema. Gastrointestinal system: Abdomen is nondistended, soft and nontender. No organomegaly or masses felt. Normal bowel sounds heard. Central nervous system: Alert and oriented. No focal neurological deficits. Psychiatry: labile    Data Reviewed: I have personally reviewed following labs and imaging studies  CBC:  Recent Labs Lab 08/19/15 1707  WBC 11.4*  NEUTROABS 9.4*  HGB 14.8  HCT 43.9  MCV 89.4  PLT 264   Basic Metabolic Panel:  Recent Labs Lab 08/19/15 1707  NA 137  K 4.3  CL 100*  CO2 29  GLUCOSE 105*  BUN 11  CREATININE 0.66  CALCIUM 10.2   GFR: CrCl cannot be calculated (Unknown ideal weight.). Liver Function Tests: No results for input(s): AST, ALT, ALKPHOS, BILITOT, PROT, ALBUMIN in the last 168 hours. No results for input(s): LIPASE, AMYLASE in the last 168 hours. No results for input(s): AMMONIA in the last 168 hours. Coagulation Profile:  Recent Labs Lab 08/19/15 1707  INR 0.95   Cardiac Enzymes: No  results for input(s): CKTOTAL, CKMB, CKMBINDEX, TROPONINI in the last 168 hours. BNP (last 3 results) No results for input(s): PROBNP in the last 8760 hours. HbA1C: No results for input(s): HGBA1C in the last 72 hours. CBG:  Recent Labs Lab 08/20/15 0933  GLUCAP 122*   Lipid Profile: No results for input(s): CHOL, HDL, LDLCALC, TRIG, CHOLHDL, LDLDIRECT in the last 72 hours. Thyroid Function Tests: No results for input(s): TSH, T4TOTAL, FREET4, T3FREE, THYROIDAB in the last 72 hours. Anemia Panel: No results for input(s): VITAMINB12, FOLATE, FERRITIN, TIBC, IRON, RETICCTPCT in the last 72 hours. Urine analysis:    Component Value  Date/Time   COLORURINE YELLOW 08/19/2015 1923   APPEARANCEUR CLOUDY* 08/19/2015 1923   LABSPEC 1.008 08/19/2015 1923   PHURINE 7.0 08/19/2015 1923   GLUCOSEU NEGATIVE 08/19/2015 1923   HGBUR NEGATIVE 08/19/2015 1923   BILIRUBINUR NEGATIVE 08/19/2015 1923   KETONESUR NEGATIVE 08/19/2015 1923   PROTEINUR NEGATIVE 08/19/2015 1923   UROBILINOGEN 0.2 03/09/2014 1520   NITRITE NEGATIVE 08/19/2015 1923   LEUKOCYTESUR LARGE* 08/19/2015 1923     )No results found for this or any previous visit (from the past 240 hour(s)).    Anti-infectives    Start     Dose/Rate Route Frequency Ordered Stop   08/20/15 0700  ceFAZolin (ANCEF) IVPB 2g/100 mL premix     2 g 200 mL/hr over 30 Minutes Intravenous To ShortStay Surgical 08/20/15 0618 08/21/15 0700   08/19/15 1715  valACYclovir (VALTREX) tablet 1,000 mg  Status:  Discontinued     1,000 mg Oral  Once 08/19/15 1701 08/19/15 1722       Radiology Studies: Dg Chest 2 View  08/19/2015  CLINICAL DATA:  Status post fall, with concern for chest injury. Initial encounter. EXAM: CHEST  2 VIEW COMPARISON:  Chest radiograph performed 08/01/2015 FINDINGS: The lungs are hyperexpanded, with flattening of the hemidiaphragms, compatible with COPD. Peribronchial thickening is noted. Mild scarring is noted at the lung apices. There is no evidence of pleural effusion or pneumothorax. The cardiomediastinal silhouette is within normal limits. No acute osseous abnormalities are seen. There is chronic deformity of the left ninth posterolateral rib. IMPRESSION: 1. No acute cardiopulmonary process seen. 2. Findings of COPD, with mild scarring at the lung apices. Electronically Signed   By: Roanna Raider M.D.   On: 08/19/2015 19:10   Dg Hip Unilat With Pelvis 2-3 Views Right  08/19/2015  CLINICAL DATA:  Status post fall, with right hip pain. Initial encounter. EXAM: DG HIP (WITH OR WITHOUT PELVIS) 2-3V RIGHT COMPARISON:  None. FINDINGS: There is a mildly comminuted  subcapital fracture of the right femoral neck, with mild transcervical extension. Both femoral heads are seated normally within their respective acetabula. No significant degenerative change is appreciated. The sacroiliac joints are unremarkable in appearance. The visualized bowel gas pattern is grossly unremarkable in appearance. IMPRESSION: Mildly comminuted subcapital fracture of the right femoral neck, with mild transcervical extension. Electronically Signed   By: Roanna Raider M.D.   On: 08/19/2015 18:24        Scheduled Meds: . antiseptic oral rinse  7 mL Mouth Rinse q12n4p  .  ceFAZolin (ANCEF) IV  2 g Intravenous To SS-Surg  . chlorhexidine  60 mL Topical Once  . cholecalciferol  3,000 Units Oral Daily  . enoxaparin (LOVENOX) injection  40 mg Subcutaneous Q24H  . fluticasone  2 spray Each Nare Daily  . fluticasone  2 puff Inhalation BID  . oxyCODONE  10 mg Oral Q12H  .  pantoprazole  40 mg Oral Q0600  . povidone-iodine  2 application Topical Once   Continuous Infusions: . sodium chloride 50 mL/hr at 08/20/15 1109  . dextrose 5 % and 0.45% NaCl    . dextrose 5 % and 0.45% NaCl       LOS: 1 day    Time spent: 35 min    Bethany Bogusz Juanetta Gosling, DO [facility] Pager [phone]  If 7PM-7AM, please contact night-coverage [email or web address] Password [email or web address] 08/20/2015, 1:03 PM

## 2015-08-20 NOTE — Progress Notes (Signed)
Initial Nutrition Assessment  DOCUMENTATION CODES:   Not applicable  INTERVENTION:  Provide Ensure Enlive po BID, each supplement provides 350 kcal and 20 grams of protein  Encourage adequate PO intake.   Recommend obtaining new weight to fully assess weight trends.   NUTRITION DIAGNOSIS:   Increased nutrient needs related to chronic illness as evidenced by estimated needs.  GOAL:   Patient will meet greater than or equal to 90% of their needs  MONITOR:   PO intake, Supplement acceptance, Weight trends, Labs, I & O's  REASON FOR ASSESSMENT:   Malnutrition Screening Tool    ASSESSMENT:   55 y.o. female with a history of TIA, GERD, Depression, fibromylagia, lung abscess and COPD on home oxygenwho complains of right hip pain in the afternoon yesterday. She lost balance when trying to hold her cat. No LOC. Her pain is at a 10/10 now, but improved significantly from yesterday. She denies hx of DVT, PE, MI. Hx of TIA and Lung abscess . XR of pelvis shows: Mildly comminuted subcapital fracture of the right femoral neck. Pt is currently refusing surgery.  Pt reports appetite is fine during time of visit. No percent meal completion recorded. During time of visit, pt had not eating yet as pt was trying to rest. Pt When asked about usual po intake PTA, pt replied with "I eat what I can" and did not provide further description. Pt was in an agitated mood during time of visit. Pt does report a 20 lb weight loss in 5 months. Reason for weight loss unknown. Noted no weight weight recorded. Recommend obtaining new weight to assess weight loss. RD to order Ensure to aid in caloric and protein needs. Pt encouraged to eat her food at meals.   Nutrition-Focused physical exam completed. Findings are no fat depletion, moderate to severe muscle depletion, and mild edema.   Labs and medications reviewed.   Diet Order:  Diet regular Room service appropriate?: Yes; Fluid consistency::  Thin  Skin:  Reviewed, no issues  Last BM:  6/19  Height:   Ht Readings from Last 1 Encounters:  07/09/15 5\' 7"  (1.702 m)    Weight:   Wt Readings from Last 1 Encounters:  07/09/15 117 lb 9.6 oz (53.343 kg)    Ideal Body Weight:  61.36 kg  BMI:  There is no weight on file to calculate BMI.  Estimated Nutritional Needs:   Kcal:  1600-1800  Protein:  65-80 grams  Fluid:  1.6 - 1.8 L/day  EDUCATION NEEDS:   No education needs identified at this time  Roslyn Smiling, MS, RD, LDN Pager # [phone] After hours/ weekend pager # [phone]

## 2015-08-20 NOTE — Progress Notes (Signed)
Patient states "I don't want anyone in my room if I'm sleeping. I'm not answering any admission questions tonight."

## 2015-08-21 DIAGNOSIS — S72001A Fracture of unspecified part of neck of right femur, initial encounter for closed fracture: Secondary | ICD-10-CM

## 2015-08-21 MED ORDER — ACETAMINOPHEN 325 MG PO TABS
650.0000 mg | ORAL_TABLET | Freq: Four times a day (QID) | ORAL | Status: DC | PRN
Start: 2015-08-21 — End: 2015-08-22
  Administered 2015-08-21: 650 mg via ORAL
  Filled 2015-08-21: qty 2

## 2015-08-21 MED ORDER — NITROFURANTOIN MONOHYD MACRO 100 MG PO CAPS: 100.0000 mg | ORAL_CAPSULE | Freq: Two times a day (BID) | ORAL | Status: DC

## 2015-08-21 MED ORDER — HYDROMORPHONE HCL 1 MG/ML IJ SOLN
0.5000 mg | INTRAMUSCULAR | Status: DC | PRN
  Administered 2015-08-21 – 2015-08-22 (×2): 0.5 mg via INTRAVENOUS
  Filled 2015-08-21: qty 1

## 2015-08-21 MED ORDER — NITROFURANTOIN MONOHYD MACRO 100 MG PO CAPS
100.0000 mg | ORAL_CAPSULE | Freq: Two times a day (BID) | ORAL | Status: DC
  Administered 2015-08-21 (×2): 100 mg via ORAL
  Filled 2015-08-21 (×3): qty 1

## 2015-08-21 MED ORDER — OXYCODONE HCL 15 MG PO TABS: 15.0000 mg | ORAL_TABLET | Freq: Four times a day (QID) | ORAL | Status: AC | PRN

## 2015-08-21 NOTE — Discharge Instructions (Signed)
Follow with Primary MD Kaleen Mask, MD in 7 days   Get CBC, CMP,  checked  by Primary MD next visit.    Activity: Touchdown weightbearing right leg   Disposition Home    Diet: Heart Healthy  , with feeding assistance and aspiration precautions.  For Heart failure patients - Check your Weight same time everyday, if you gain over 2 pounds, or you develop in leg swelling, experience more shortness of breath or chest pain, call your Primary MD immediately. Follow Cardiac Low Salt Diet and 1.5 lit/day fluid restriction.   On your next visit with your primary care physician please Get Medicines reviewed and adjusted.   Please request your Prim.MD to go over all Hospital Tests and Procedure/Radiological results at the follow up, please get all Hospital records sent to your Prim MD by signing hospital release before you go home.   If you experience worsening of your admission symptoms, develop shortness of breath, life threatening emergency, suicidal or homicidal thoughts you must seek medical attention immediately by calling 911 or calling your MD immediately  if symptoms less severe.  You Must read complete instructions/literature along with all the possible adverse reactions/side effects for all the Medicines you take and that have been prescribed to you. Take any new Medicines after you have completely understood and accpet all the possible adverse reactions/side effects.   Do not drive, operating heavy machinery, perform activities at heights, swimming or participation in water activities or provide baby sitting services if your were admitted for syncope or siezures until you have seen by Primary MD or a Neurologist and advised to do so again.  Do not drive when taking Pain medications.    Do not take more than prescribed Pain, Sleep and Anxiety Medications  Special Instructions: If you have smoked or chewed Tobacco  in the last 2 yrs please stop smoking, stop any regular  Alcohol  and or any Recreational drug use.  Wear Seat belts while driving.   Please note  You were cared for by a hospitalist during your hospital stay. If you have any questions about your discharge medications or the care you received while you were in the hospital after you are discharged, you can call the unit and asked to speak with the hospitalist on call if the hospitalist that took care of you is not available. Once you are discharged, your primary care physician will handle any further medical issues. Please note that NO REFILLS for any discharge medications will be authorized once you are discharged, as it is imperative that you return to your primary care physician (or establish a relationship with a primary care physician if you do not have one) for your aftercare needs so that they can reassess your need for medications and monitor your lab values.

## 2015-08-21 NOTE — Progress Notes (Signed)
I spent roughly 45 minutes in face to face discussion with [name].   We had a long talk about the risks and benefits of surgery and nonoperative treatment. At the end of this discussion she stated that I had answered all of her questions. She stated that she does not want surgery.  I strongly encouraged her to work with physical therapist who she has been sending away. She did get verbally abusive using profanity but was able to be redirected.  She states that she understands that if her fracture does displace it may need a larger surgery including a hemiarthroplasty.  Plan: Touchdown weightbearing right leg PT to mobilize Follow-up x-ray in my office in 1-2 weeks    [name]

## 2015-08-21 NOTE — Progress Notes (Signed)
PT Cancellation Note  Patient Details Name: [name] MRN: [id number] DOB: Apr 03, 1960   Cancelled Treatment:    Reason Eval/Treat Not Completed: Other (comment).  Paged to return to pt's room and present during end of MD discussion with pt about importance of working with therapy as pt again declined surgery at this point.  Spent an additional in room with pt, husband, and nursing arriving at end encouraging pt to attempt mobility.  Pt states that the bruising and "fluid" on her L shoulder and Bil knees prevents her from using her L UE and bil LEs to perform any mobility.  When PT told pt that she could A and help teach pt how to mobilize, pt and husband becoming upset and refusing any mobility.  Pt and husband arguing about sending PT away as pt indicates she doesn't want the MD to think she sent PT away for a second time, however husband emphatic that PT needed to leave.  Informed RN and Case Manager.     Freda Jaquith, Alison Murray 08/21/2015, 2:24 PM

## 2015-08-21 NOTE — Discharge Summary (Signed)
[name], is a 55 y.o. female  DOB 10-19-1960  MRN [id number].  Admission date:  08/19/2015  Admitting Physician  Eston Esters, MD  Discharge Date:  08/21/2015   Primary MD  Kaleen Mask, MD  Recommendations for primary care physician for things to follow:  - Follow with orthopedic in 1-2 weeks as an outpatient - please check CBC, BMP during next visit   Admission Diagnosis  possible hip fracture Right Hip Fx   Discharge Diagnosis  possible hip fracture Right Hip Fx    Active Problems:   Femoral neck fracture, right, closed, initial encounter      Past Medical History  Diagnosis Date  . TIA (transient ischemic attack)   . GERD (gastroesophageal reflux disease)   . Hepatomegaly     hx  . Colonic inertia   . Lung abscess (HCC)     "calcified over"; bilateral   . COPD (chronic obstructive pulmonary disease) (HCC)   . Stroke [facility]) 2009    TIA  . PTSD (post-traumatic stress disorder)   . Bipolar disorder (HCC)   . Depression   . Chronic headache   . Seizures (HCC)     in past  . Fibromyalgia   . Iron deficiency anemia   . Arthritis   . Acute ischemic colitis (HCC) 11/01/2012  . Marijuana abuse 2014    positive screen in hospital  . TIA (transient ischemic attack)     Past Surgical History  Procedure Laterality Date  . 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 Cesarean section      x 2  . Esophagogastroduodenoscopy  12/23/2011    Procedure: ESOPHAGOGASTRODUODENOSCOPY (EGD);  Surgeon: Hart Carwin, MD;  Location: [facility];  Service: Endoscopy;  Laterality: N/A;  . Colonoscopy    . Colonoscopy N/A 11/01/2012    Procedure: COLONOSCOPY;  Surgeon: Iva Boop, MD;  Location: [facility];  Service: Endoscopy;  Laterality: N/A;       History of present illness and  Hospital Course:     Kindly see H&P for history of present illness  and admission details, please review complete Labs, Consult reports and Test reports for all details in brief  HPI  from the history and physical done on the day of admission 08/19/2015 Patient is a 55 year old female with history of TIA, GERD, Depression, fibromylagia, lung abscess and COPD on home oxygen who came with cc of fall and right hip pain. She said she was with her neighbor and was trying to hold a cat from being around their baby when she tripped and fell on her right side. She denied any complaints of CP, dyspnea, dizziness or focal sensory/motor deficits prior to that. She had a fall on June 1st as well , also mechanical as she tripped while she was wearing oxygen. She has no complaints except for right hip pain today.    Hospital Course  55 year old female with  history of TIA, GERD, Depression, fibromylagia, lung abscess and COPD on home oxygen who came with cc of fall and right hip pain. She said she was with her neighbor and was trying to hold a cat from being around their baby when she tripped and fell on her right side. She denied any complaints of CP, dyspnea, dizziness or focal sensory/motor deficits prior to that. She had a fall on June 1st as well , also mechanical as she tripped while she was wearing oxygen. She has no complaints except for right hip pain today. Has refused surgery x 3.   R.femoral neck fracture:  - S/P Mechanical fall - Ortho consulted : patient currently refusing surgery despite  multiple discussions, follow with orthopedic as an outpatient   H/o TIA: - Outpatient follow up  Depression/ Bipolar/personality d/o - Psych eval as outpatient. Not suicidal.   Fibromyalgia - pain control will be an issue - added long acting and home PRN as well as IV  COPD:  - Continue oxygen therapy and albuterol , she reports she is on three quarters of a liters daytime, and 1 L nighttime. - Cont Flovent   UTI - will start on nitrofurantoin, to finish total of 3  days.  Discharge Condition:  Stable, refusing surgery   Follow UP  Follow-up Information    Follow up with MURPHY, TIMOTHY D, MD In 2 weeks.   Specialty:  Orthopedic Surgery   Contact information:   [address] [place] [address] [phone]       Follow up with Kaleen Mask, MD. Schedule an appointment as soon as possible for a visit in 1 week.   Specialty:  Family Medicine   Contact information:   [address] [place] [address] [phone]         Discharge Instructions  and  Discharge Medications         Discharge Instructions    Discharge instructions    Complete by:  As directed   Follow with Primary MD Kaleen Mask, MD in 7 days   Get CBC, CMP,  checked  by Primary MD next visit.    Activity: Touchdown weightbearing right leg   Disposition Home    Diet: Heart Healthy  , with feeding assistance and aspiration precautions.  For Heart failure patients - Check your Weight same time everyday, if you gain over 2 pounds, or you develop in leg swelling, experience more shortness of breath or chest pain, call your Primary MD immediately. Follow Cardiac Low Salt Diet and 1.5 lit/day fluid restriction.   On your next visit with your primary care physician please Get Medicines reviewed and adjusted.   Please request your Prim.MD to go over all Hospital Tests and Procedure/Radiological results at the follow up, please get all Hospital records sent to your Prim MD by signing hospital release before you go home.   If you experience worsening of your admission symptoms, develop shortness of breath, life threatening emergency, suicidal or homicidal thoughts you must seek medical attention immediately by calling 911 or calling your MD immediately  if symptoms less severe.  You Must read complete instructions/literature along with all the possible adverse reactions/side effects for all the Medicines you take and that  have been prescribed to you. Take any new Medicines after you have completely understood and accpet all the possible adverse reactions/side effects.   Do not drive, operating heavy machinery, perform activities at heights, swimming or participation in water activities or  provide baby sitting services if your were admitted for syncope or siezures until you have seen by Primary MD or a Neurologist and advised to do so again.  Do not drive when taking Pain medications.    Do not take more than prescribed Pain, Sleep and Anxiety Medications  Special Instructions: If you have smoked or chewed Tobacco  in the last 2 yrs please stop smoking, stop any regular Alcohol  and or any Recreational drug use.  Wear Seat belts while driving.   Please note  You were cared for by a hospitalist during your hospital stay. If you have any questions about your discharge medications or the care you received while you were in the hospital after you are discharged, you can call the unit and asked to speak with the hospitalist on call if the hospitalist that took care of you is not available. Once you are discharged, your primary care physician will handle any further medical issues. Please note that NO REFILLS for any discharge medications will be authorized once you are discharged, as it is imperative that you return to your primary care physician (or establish a relationship with a primary care physician if you do not have one) for your aftercare needs so that they can reassess your need for medications and monitor your lab values.            Medication List    STOP taking these medications        hydrochlorothiazide 12.5 MG capsule  Commonly known as:  MICROZIDE      TAKE these medications        dicyclomine 10 MG capsule  Commonly known as:  BENTYL  Take 1 cap by mouth every 4-6 hrs PRN cramps     fluticasone 110 MCG/ACT inhaler  Commonly known as:  FLOVENT HFA  Inhale 2 puffs into the lungs 2 (two)  times daily.     Guaifenesin 1200 MG Tb12  Take 1 tablet (1,200 mg total) by mouth 2 (two) times daily at 10 AM and 5 PM.     ipratropium-albuterol 0.5-2.5 (3) MG/3ML Soln  Commonly known as:  DUONEB  Take 3 mLs by nebulization 4 (four) times daily.     lactose free nutrition Liqd  Take 237 mLs by mouth 2 (two) times daily between meals.     meloxicam 15 MG tablet  Commonly known as:  MOBIC  Take 1 tablet (15 mg total) by mouth daily.     methocarbamol 500 MG tablet  Commonly known as:  ROBAXIN  Take 1 tablet (500 mg total) by mouth 2 (two) times daily.     mometasone 50 MCG/ACT nasal spray  Commonly known as:  NASONEX  Place 2 sprays into the nose daily.     nitrofurantoin (macrocrystal-monohydrate) 100 MG capsule  Commonly known as:  MACROBID  Take 1 capsule (100 mg total) by mouth every 12 (twelve) hours.     oxyCODONE 15 MG immediate release tablet  Commonly known as:  ROXICODONE  Take 1 tablet (15 mg total) by mouth every 6 (six) hours as needed for pain.     pantoprazole 40 MG tablet  Commonly known as:  PROTONIX  Take 1 tablet (40 mg total) by mouth daily at 6 (six) AM.     Tiotropium Bromide-Olodaterol 2.5-2.5 MCG/ACT Aers  Commonly known as:  STIOLTO RESPIMAT  Inhale 2 puffs into the lungs daily.     VENTOLIN HFA 108 (90 Base) MCG/ACT inhaler  Generic drug:  albuterol  Inhale 2 puffs into the lungs every 6 (six) hours as needed for wheezing or shortness of breath.     albuterol (2.5 MG/3ML) 0.083% nebulizer solution  Commonly known as:  PROVENTIL  Take 3 mLs (2.5 mg total) by nebulization every 6 (six) hours as needed for shortness of breath. For shortness of breath     Vitamin D3 3000 units Tabs  Take 3,000 Units by mouth daily.     VITAMIN E PO  Take 1 capsule by mouth daily.          Diet and Activity recommendation: See Discharge Instructions above   Consults obtained -  Orthopedics Dr. Margarita Rana   Major procedures and Radiology  Reports - PLEASE review detailed and final reports for all details, in brief -      Dg Chest 2 View  08/19/2015  CLINICAL DATA:  Status post fall, with concern for chest injury. Initial encounter. EXAM: CHEST  2 VIEW COMPARISON:  Chest radiograph performed 08/01/2015 FINDINGS: The lungs are hyperexpanded, with flattening of the hemidiaphragms, compatible with COPD. Peribronchial thickening is noted. Mild scarring is noted at the lung apices. There is no evidence of pleural effusion or pneumothorax. The cardiomediastinal silhouette is within normal limits. No acute osseous abnormalities are seen. There is chronic deformity of the left ninth posterolateral rib. IMPRESSION: 1. No acute cardiopulmonary process seen. 2. Findings of COPD, with mild scarring at the lung apices. Electronically Signed   By: Roanna Raider M.D.   On: 08/19/2015 19:10   Dg Chest 2 View  08/01/2015  CLINICAL DATA:  Trip over oxygen tank this morning. Left upper chest pain and bruising. EXAM: CHEST  2 VIEW COMPARISON:  CT 05/22/2015 FINDINGS: Questioned rib fracture on shoulder radiograph not well seen. The lungs are hyperinflated with emphysema. Heart size and mediastinal contours are normal. No pneumothorax or pleural effusion. Calcified granuloma in the left upper lobe. No focal airspace disease. IMPRESSION: 1. Emphysema, no acute intrathoracic process. 2. The questioned rib fracture on shoulder radiograph is not well seen. Electronically Signed   By: Rubye Oaks M.D.   On: 08/01/2015 04:28   Ct Head Wo Contrast  08/01/2015  CLINICAL DATA:  Trip over oxygen tubing resulting in fall. Head injury with hematoma left periorbital region. Now with headache. EXAM: CT HEAD WITHOUT CONTRAST CT CERVICAL SPINE WITHOUT CONTRAST TECHNIQUE: Multidetector CT imaging of the head and cervical spine was performed following the standard protocol without intravenous contrast. Multiplanar CT image reconstructions of the cervical spine were also  generated. COMPARISON:  Head CT 10/13/2012 FINDINGS: CT HEAD FINDINGS No intracranial hemorrhage, mass effect, or midline shift. No hydrocephalus. The basilar cisterns are patent. No evidence of territorial infarct. No intracranial fluid collection. Left periorbital edema without subjacent fracture. Calvarium is intact. Included paranasal sinuses and mastoid air cells are well aerated. CT CERVICAL SPINE FINDINGS No fracture or acute subluxation. The dens is intact. There are no jumped or perched facets. Disc space narrowing at C4-C5 and C5-C6 with endplate spurring. Minimal anterolisthesis of C4 on C5 appears degenerative. There is scattered facet arthropathy, left greater than right. No prevertebral soft tissue edema. Emphysema and scarring at the lung apices. IMPRESSION: 1.  No acute intracranial abnormality. 2. Degenerative change in the cervical spine without acute fracture or subluxation. Electronically Signed   By: Rubye Oaks M.D.   On: 08/01/2015 05:03   Ct Cervical Spine Wo Contrast  08/01/2015  CLINICAL DATA:  Trip over oxygen tubing resulting in  fall. Head injury with hematoma left periorbital region. Now with headache. EXAM: CT HEAD WITHOUT CONTRAST CT CERVICAL SPINE WITHOUT CONTRAST TECHNIQUE: Multidetector CT imaging of the head and cervical spine was performed following the standard protocol without intravenous contrast. Multiplanar CT image reconstructions of the cervical spine were also generated. COMPARISON:  Head CT 10/13/2012 FINDINGS: CT HEAD FINDINGS No intracranial hemorrhage, mass effect, or midline shift. No hydrocephalus. The basilar cisterns are patent. No evidence of territorial infarct. No intracranial fluid collection. Left periorbital edema without subjacent fracture. Calvarium is intact. Included paranasal sinuses and mastoid air cells are well aerated. CT CERVICAL SPINE FINDINGS No fracture or acute subluxation. The dens is intact. There are no jumped or perched facets. Disc  space narrowing at C4-C5 and C5-C6 with endplate spurring. Minimal anterolisthesis of C4 on C5 appears degenerative. There is scattered facet arthropathy, left greater than right. No prevertebral soft tissue edema. Emphysema and scarring at the lung apices. IMPRESSION: 1.  No acute intracranial abnormality. 2. Degenerative change in the cervical spine without acute fracture or subluxation. Electronically Signed   By: Rubye Oaks M.D.   On: 08/01/2015 05:03   Mr Brain Wo Contrast  08/01/2015  CLINICAL DATA:  Head trauma. Patient fell and hit head yesterday in kitchen. Worsening symptoms. EXAM: MRI HEAD WITHOUT CONTRAST TECHNIQUE: Multiplanar, multiecho pulse sequences of the brain and surrounding structures were obtained without intravenous contrast. COMPARISON:  CT head earlier today. FINDINGS: No evidence for acute infarction, hemorrhage, mass lesion, hydrocephalus, or extra-axial fluid. Slight premature atrophy. Mild subcortical and periventricular T2 and FLAIR hyperintensities, likely chronic microvascular ischemic change. Pituitary, pineal, and cerebellar tonsils unremarkable. No upper cervical lesions. Flow voids are maintained throughout the carotid, basilar, and vertebral arteries. There are no areas of chronic hemorrhage. Unremarkable paranasal sinuses and mastoids. No orbital abnormality, other than slight LEFT preseptal periorbital hematoma/soft tissue swelling. The scalp soft tissues are otherwise unremarkable. IMPRESSION: LEFT periorbital edema without acute intracranial findings. No intracranial hemorrhage or visible brain contusion. Mild subcortical and periventricular T2 and FLAIR hyperintensities, likely chronic microvascular ischemic change. Electronically Signed   By: Elsie Stain M.D.   On: 08/01/2015 18:15   Dg Shoulder Left  08/01/2015  CLINICAL DATA:  Trip over oxygen tank this morning. Left shoulder and upper chest pain. EXAM: LEFT SHOULDER - 2+ VIEW COMPARISON:  None. FINDINGS: No  acute fracture or dislocation of the shoulder. Acromioclavicular alignment is maintained. There is a probable nondisplaced fracture of anterior left tentatively identified third rib. IMPRESSION: 1. No fracture or subluxation of the shoulder. 2. Probable nondisplaced left anterior third rib fracture. Electronically Signed   By: Rubye Oaks M.D.   On: 08/01/2015 04:26   Dg Hand Complete Left  08/01/2015  CLINICAL DATA:  Trip over oxygen tank this morning. Left hand pain and bruising. EXAM: LEFT HAND - COMPLETE 3+ VIEW COMPARISON:  None. FINDINGS: There is no evidence of fracture or dislocation. There is no evidence of arthropathy or other focal bone abnormality. Soft tissues are unremarkable. IMPRESSION: Negative radiographs of the left hand. Electronically Signed   By: Rubye Oaks M.D.   On: 08/01/2015 04:29   Dg Hip Unilat With Pelvis 2-3 Views Left  08/01/2015  CLINICAL DATA:  Trip over oxygen tank this morning. Now with left hip pain bruising. EXAM: DG HIP (WITH OR WITHOUT PELVIS) 2-3V LEFT COMPARISON:  None. FINDINGS: The cortical margins of the bony pelvis and left hip are intact. No fracture. Pubic symphysis and sacroiliac joints are congruent.  Both femoral heads are well-seated in the respective acetabula. The bones are under mineralized. IMPRESSION: No pelvic or left hip fracture. Electronically Signed   By: Rubye Oaks M.D.   On: 08/01/2015 04:24   Dg Hip Unilat With Pelvis 2-3 Views Right  08/19/2015  CLINICAL DATA:  Status post fall, with right hip pain. Initial encounter. EXAM: DG HIP (WITH OR WITHOUT PELVIS) 2-3V RIGHT COMPARISON:  None. FINDINGS: There is a mildly comminuted subcapital fracture of the right femoral neck, with mild transcervical extension. Both femoral heads are seated normally within their respective acetabula. No significant degenerative change is appreciated. The sacroiliac joints are unremarkable in appearance. The visualized bowel gas pattern is grossly  unremarkable in appearance. IMPRESSION: Mildly comminuted subcapital fracture of the right femoral neck, with mild transcervical extension. Electronically Signed   By: Roanna Raider M.D.   On: 08/19/2015 18:24    Micro Results    No results found for this or any previous visit (from the past 240 hour(s)).     Today   Subjective:   [name] today reports her right hip pain is mainly upon ambulating her leg.  Objective:   Blood pressure 93/48, pulse 100, temperature 98.6 F (37 C), temperature source Oral, resp. rate 18, last menstrual period 05/29/2010, SpO2 90 %.   Intake/Output Summary (Last 24 hours) at 08/21/15 1459 Last data filed at 08/21/15 0800  Gross per 24 hour  Intake    840 ml  Output    650 ml  Net    190 ml    Exam General exam: Awake, alert, no apparent distress Respiratory system: Clear to auscultation. Respiratory effort normal. Cardiovascular system: S1 & S2 heard, RRR. No JVD, murmurs, rubs, gallops or clicks. No pedal edema. Gastrointestinal system: Abdomen is nondistended, soft and nontender. Bowel sounds present  Central nervous system: Alert and oriented. No focal neurological deficits. Psychiatry: labile  Data Review   CBC w Diff:  Lab Results  Component Value Date   WBC 11.4* 08/19/2015   HGB 14.8 08/19/2015   HCT 43.9 08/19/2015   PLT 264 08/19/2015   LYMPHOPCT 12 08/19/2015   MONOPCT 5 08/19/2015   EOSPCT 0 08/19/2015   BASOPCT 0 08/19/2015    CMP:  Lab Results  Component Value Date   NA 137 08/19/2015   K 4.3 08/19/2015   CL 100* 08/19/2015   CO2 29 08/19/2015   BUN 11 08/19/2015   CREATININE 0.66 08/19/2015   CREATININE 0.66 12/13/2014   PROT 6.6 03/09/2014   ALBUMIN 3.7 03/09/2014   BILITOT 1.0 03/09/2014   ALKPHOS 75 03/09/2014   AST 30 03/09/2014   ALT 14 03/09/2014  .   Total Time in preparing paper work, data evaluation and todays exam - 35 minutes  Rashonda Warrior M.D on 08/21/2015 at 2:59 PM  [facility]   Office  [phone]

## 2015-08-21 NOTE — Care Management (Addendum)
Case manager has spoken with patient several times concerning discharge plan. Patient has said no to having surgery. Refusing to work with therapy since admission. CM explained to patient and her husband that unfortunately she will not be able to have home health therapy. CM informed patient and husband that we will arrange ambulance transport for her. Patient and husband very angry with not being able to stay as inpatient. Marland Kitchen

## 2015-08-21 NOTE — Clinical Documentation Improvement (Signed)
Hospitalist  Please document query responses in the progress notes and discharge summary, not on the CDI BPA form in [facility]. Thank you!  Please document if a conditio below provides greater specificity regarding the patient's chronic O2 dependence:  - Chronic Hypoxic Respiratory Failure, stable  - other condition  - unable to clinically determine  Clinical Information: "COPD on home oxygen who came with cc of fall and right hip pain" documented in H&P Oxygen therapy, albuterol and Flovent continued this admission History of bilateral lung abscess per Past Medical History   Please exercise your independent, professional judgment when responding. A specific answer is not anticipated or expected.   Thank You, Jerral Ralph  RN BSN CCDS [phone] Health Information Management [place]

## 2015-08-21 NOTE — Progress Notes (Signed)
PT Cancellation Note  Patient Details Name: [name] MRN: [id number] DOB: September 08, 1960   Cancelled Treatment:    Reason Eval/Treat Not Completed: Other (comment)  Pt very upset stating she hasn't talked to the doctor and that no one has explained her injury or the risks of having surgery with her.  When PT referred to Ortho note stating that he explained non operative vs operative treatment, pt insists this did not occur.  Pt adamantly refusing any PT or mobility insisting she needs to speak to a doctor so that she can make an "intelligent decision".  Will f/u as appropriate.     Sunny Schlein, [place] [phone] 08/21/2015, 11:23 AM

## 2015-08-21 NOTE — Progress Notes (Signed)
PROGRESS NOTE    [name]  JKD:[id number] DOB: 1960/10/12 DOA: 08/19/2015 PCP: Kaleen Mask, MD   Outpatient Specialists: Dr. Alan Ripper   Brief Narrative:   55 year old female with history of TIA, GERD, Depression, fibromylagia, lung abscess and COPD on home oxygen who came with cc of fall and right hip pain. She said she was with her neighbor and was trying to hold a cat from being around their baby when she tripped and fell on her right side. She denied any complaints of CP, dyspnea, dizziness or focal sensory/motor deficits prior to that. She had a fall on June 1st as well , also mechanical as she tripped while she was wearing oxygen. She has no complaints except for right hip pain today.  Has refused surgery x 2.     Assessment & Plan:   Active Problems:   Femoral neck fracture, right, closed, initial encounter  R.femoral neck fracture:  - S/P Mechanical fall - Ortho consulted : patient currently refusing surgery, he will D/W with her  again today. - pain control - PT eval: toe touch on right   H/o TIA: - Outpatient follow up  Depression/ Bipolar/personality d/o - Psych eval as outpatient. Not suicidal.   Fibromyalgia - pain control will be an issue - added long acting and home PRN as well as IV  COPD:  - Continue oxygen therapy and albuterol , she reports she is on three quarters of a liters daytime, and 1 L nighttime. - Cont Flovent     Code Status: Full Code   Family Communication: None at bedside  Disposition Plan:  Ending patient decision today about surgery, if she still refusing, can be discharged home .   Consultants:   ortho  Procedures:     Antimicrobials:       Subjective: Upset , reports her right hip pain is mainly upon ambulating her leg.   Objective: Filed Vitals:   08/20/15 1942 08/20/15 2223 08/21/15 0618 08/21/15 0849  BP:  100/55 97/49   Pulse: 93 103 96   Temp:  99.6 F (37.6 C) 98.2 F (36.8 C)    TempSrc:  Oral Oral   Resp: 16 16 16    SpO2: 93% 93% 96% 90%    Intake/Output Summary (Last 24 hours) at 08/21/15 1126 Last data filed at 08/21/15 0650  Gross per 24 hour  Intake    840 ml  Output    300 ml  Net    540 ml   There were no vitals filed for this visit.  Examination:  General exam: Awake, alert, no apparent distress Respiratory system: Clear to auscultation. Respiratory effort normal. Cardiovascular system: S1 & S2 heard, RRR. No JVD, murmurs, rubs, gallops or clicks. No pedal edema. Gastrointestinal system: Abdomen is nondistended, soft and nontender. Bowel sounds present  Central nervous system: Alert and oriented. No focal neurological deficits. Psychiatry: labile    Data Reviewed: I have personally reviewed following labs and imaging studies  CBC:  Recent Labs Lab 08/19/15 1707  WBC 11.4*  NEUTROABS 9.4*  HGB 14.8  HCT 43.9  MCV 89.4  PLT 264   Basic Metabolic Panel:  Recent Labs Lab 08/19/15 1707  NA 137  K 4.3  CL 100*  CO2 29  GLUCOSE 105*  BUN 11  CREATININE 0.66  CALCIUM 10.2   GFR: CrCl cannot be calculated (Unknown ideal weight.). Liver Function Tests: No results for input(s): AST, ALT, ALKPHOS, BILITOT, PROT, ALBUMIN in the last 168 hours.  No results for input(s): LIPASE, AMYLASE in the last 168 hours. No results for input(s): AMMONIA in the last 168 hours. Coagulation Profile:  Recent Labs Lab 08/19/15 1707  INR 0.95   Cardiac Enzymes: No results for input(s): CKTOTAL, CKMB, CKMBINDEX, TROPONINI in the last 168 hours. BNP (last 3 results) No results for input(s): PROBNP in the last 8760 hours. HbA1C: No results for input(s): HGBA1C in the last 72 hours. CBG:  Recent Labs Lab 08/20/15 0933  GLUCAP 122*   Lipid Profile: No results for input(s): CHOL, HDL, LDLCALC, TRIG, CHOLHDL, LDLDIRECT in the last 72 hours. Thyroid Function Tests: No results for input(s): TSH, T4TOTAL, FREET4, T3FREE, THYROIDAB in the last  72 hours. Anemia Panel: No results for input(s): VITAMINB12, FOLATE, FERRITIN, TIBC, IRON, RETICCTPCT in the last 72 hours. Urine analysis:    Component Value Date/Time   COLORURINE YELLOW 08/19/2015 1923   APPEARANCEUR CLOUDY* 08/19/2015 1923   LABSPEC 1.008 08/19/2015 1923   PHURINE 7.0 08/19/2015 1923   GLUCOSEU NEGATIVE 08/19/2015 1923   HGBUR NEGATIVE 08/19/2015 1923   BILIRUBINUR NEGATIVE 08/19/2015 1923   KETONESUR NEGATIVE 08/19/2015 1923   PROTEINUR NEGATIVE 08/19/2015 1923   UROBILINOGEN 0.2 03/09/2014 1520   NITRITE NEGATIVE 08/19/2015 1923   LEUKOCYTESUR LARGE* 08/19/2015 1923     )No results found for this or any previous visit (from the past 240 hour(s)).    Anti-infectives    Start     Dose/Rate Route Frequency Ordered Stop   08/20/15 0700  ceFAZolin (ANCEF) IVPB 2g/100 mL premix     2 g 200 mL/hr over 30 Minutes Intravenous To [facility] 08/20/15 0618 08/21/15 0700   08/19/15 1715  valACYclovir (VALTREX) tablet 1,000 mg  Status:  Discontinued     1,000 mg Oral  Once 08/19/15 1701 08/19/15 1722       Radiology Studies: Dg Chest 2 View  08/19/2015  CLINICAL DATA:  Status post fall, with concern for chest injury. Initial encounter. EXAM: CHEST  2 VIEW COMPARISON:  Chest radiograph performed 08/01/2015 FINDINGS: The lungs are hyperexpanded, with flattening of the hemidiaphragms, compatible with COPD. Peribronchial thickening is noted. Mild scarring is noted at the lung apices. There is no evidence of pleural effusion or pneumothorax. The cardiomediastinal silhouette is within normal limits. No acute osseous abnormalities are seen. There is chronic deformity of the left ninth posterolateral rib. IMPRESSION: 1. No acute cardiopulmonary process seen. 2. Findings of COPD, with mild scarring at the lung apices. Electronically Signed   By: Roanna Raider M.D.   On: 08/19/2015 19:10   Dg Hip Unilat With Pelvis 2-3 Views Right  08/19/2015  CLINICAL DATA:  Status  post fall, with right hip pain. Initial encounter. EXAM: DG HIP (WITH OR WITHOUT PELVIS) 2-3V RIGHT COMPARISON:  None. FINDINGS: There is a mildly comminuted subcapital fracture of the right femoral neck, with mild transcervical extension. Both femoral heads are seated normally within their respective acetabula. No significant degenerative change is appreciated. The sacroiliac joints are unremarkable in appearance. The visualized bowel gas pattern is grossly unremarkable in appearance. IMPRESSION: Mildly comminuted subcapital fracture of the right femoral neck, with mild transcervical extension. Electronically Signed   By: Roanna Raider M.D.   On: 08/19/2015 18:24        Scheduled Meds: . antiseptic oral rinse  7 mL Mouth Rinse q12n4p  . chlorhexidine  60 mL Topical Once  . cholecalciferol  3,000 Units Oral Daily  . enoxaparin (LOVENOX) injection  40 mg Subcutaneous Q24H  .  feeding supplement (ENSURE ENLIVE)  237 mL Oral BID BM  . fluticasone  2 spray Each Nare Daily  . fluticasone  2 puff Inhalation BID  . oxyCODONE  10 mg Oral Q12H  . pantoprazole  40 mg Oral Q0600  . povidone-iodine  2 application Topical Once   Continuous Infusions: . sodium chloride 50 mL/hr at 08/21/15 0700  . dextrose 5 % and 0.45% NaCl       LOS: 2 days    Time spent: 25 min    Braidyn Scorsone, MD [facility] Pager [phone]  If 7PM-7AM, please contact night-coverage [email or web address] Password [facility] 08/21/2015, 11:26 AM

## 2015-08-22 LAB — CBC
HCT: 30.7 % — ABNORMAL LOW (ref 36.0–46.0)
Hemoglobin: 9.8 g/dL — ABNORMAL LOW (ref 12.0–15.0)
MCH: 28.9 pg (ref 26.0–34.0)
MCHC: 31.9 g/dL (ref 30.0–36.0)
MCV: 90.6 fL (ref 78.0–100.0)
PLATELETS: 127 10*3/uL — AB (ref 150–400)
RBC: 3.39 MIL/uL — AB (ref 3.87–5.11)
RDW: 13.7 % (ref 11.5–15.5)
WBC: 7 10*3/uL (ref 4.0–10.5)

## 2015-08-22 LAB — BASIC METABOLIC PANEL
ANION GAP: 7 (ref 5–15)
BUN: 6 mg/dL (ref 6–20)
CO2: 28 mmol/L (ref 22–32)
Calcium: 8.5 mg/dL — ABNORMAL LOW (ref 8.9–10.3)
Chloride: 99 mmol/L — ABNORMAL LOW (ref 101–111)
Creatinine, Ser: 0.55 mg/dL (ref 0.44–1.00)
GFR calc Af Amer: >60 mL/min (ref >60)
Glucose, Bld: 101 mg/dL — ABNORMAL HIGH (ref 65–99)
POTASSIUM: 3.6 mmol/L (ref 3.5–5.1)
SODIUM: 134 mmol/L — AB (ref 135–145)

## 2015-08-22 LAB — GLUCOSE, CAPILLARY: GLUCOSE-CAPILLARY: 96 mg/dL (ref 65–99)

## 2015-08-22 NOTE — Discharge Summary (Signed)
[name], is a 55 y.o. female  DOB September 15, 1960  MRN [id number].  Admission date:  08/19/2015  Admitting Physician  Eston Esters, MD  Discharge Date:  08/22/2015   Primary MD  Kaleen Mask, MD  No significant updates since discharge summary on 6/21, patient was kept overnight as her foley catheter was discontinued later during the day, and first voiding on her own was too late for discharge, so she was kept overnight, patient is voiding, will discharge today.  Recommendations for primary care physician for things to follow:  - Follow with orthopedic in 1-2 weeks as an outpatient - please check CBC, BMP during next visit   Admission Diagnosis  possible hip fracture Right Hip Fx   Discharge Diagnosis  possible hip fracture Right Hip Fx    Active Problems:   Femoral neck fracture, right, closed, initial encounter      Past Medical History  Diagnosis Date  . TIA (transient ischemic attack)   . GERD (gastroesophageal reflux disease)   . Hepatomegaly     hx  . Colonic inertia   . Lung abscess (HCC)     "calcified over"; bilateral   . COPD (chronic obstructive pulmonary disease) (HCC)   . Stroke [facility]) 2009    TIA  . PTSD (post-traumatic stress disorder)   . Bipolar disorder (HCC)   . Depression   . Chronic headache   . Seizures (HCC)     in past  . Fibromyalgia   . Iron deficiency anemia   . Arthritis   . Acute ischemic colitis (HCC) 11/01/2012  . Marijuana abuse 2014    positive screen in hospital  . TIA (transient ischemic attack)     Past Surgical History  Procedure Laterality Date  . 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 Cesarean section      x 2  . Esophagogastroduodenoscopy  12/23/2011    Procedure: ESOPHAGOGASTRODUODENOSCOPY (EGD);  Surgeon: Hart Carwin, MD;  Location: [facility];  Service: Endoscopy;  Laterality: N/A;  .  Colonoscopy    . Colonoscopy N/A 11/01/2012    Procedure: COLONOSCOPY;  Surgeon: Iva Boop, MD;  Location: [facility];  Service: Endoscopy;  Laterality: N/A;       History of present illness and  Hospital Course:     Kindly see H&P for history of present illness and admission details, please review complete Labs, Consult reports and Test reports for all details in brief  HPI  from the history and physical done on the day of admission 08/19/2015 Patient is a 55 year old female with history of TIA, GERD, Depression, fibromylagia, lung abscess and COPD on home oxygen who came with cc of fall and right hip pain. She said she was with her neighbor and was trying to hold a cat from being around their baby when she tripped and fell on her right side. She denied any complaints of CP, dyspnea, dizziness or focal sensory/motor  deficits prior to that. She had a fall on June 1st as well , also mechanical as she tripped while she was wearing oxygen. She has no complaints except for right hip pain today.    Hospital Course  55 year old female with history of TIA, GERD, Depression, fibromylagia, lung abscess and COPD on home oxygen who came with cc of fall and right hip pain. She said she was with her neighbor and was trying to hold a cat from being around their baby when she tripped and fell on her right side. She denied any complaints of CP, dyspnea, dizziness or focal sensory/motor deficits prior to that. She had a fall on June 1st as well , also mechanical as she tripped while she was wearing oxygen. She has no complaints except for right hip pain today. Has refused surgery x 3.   R.femoral neck fracture:  - S/P Mechanical fall - Ortho consulted : patient currently refusing surgery despite  multiple discussions, follow with orthopedic as an outpatient   H/o TIA: - Outpatient follow up  Depression/ Bipolar/personality d/o - Psych eval as outpatient. Not suicidal.   Fibromyalgia - pain  control will be an issue - added long acting and home PRN as well as IV  COPD:  - Continue oxygen therapy and albuterol , she reports she is on three quarters of a liters daytime, and 1 L nighttime. - Cont Flovent   UTI - will start on nitrofurantoin, to finish total of 3 days.  Discharge Condition:  Stable, refusing surgery   Follow UP  Follow-up Information    Follow up with MURPHY, TIMOTHY D, MD In 2 weeks.   Specialty:  Orthopedic Surgery   Contact information:   [address] [place] [address] [phone]       Follow up with Kaleen Mask, MD. Schedule an appointment as soon as possible for a visit in 1 week.   Specialty:  Family Medicine   Contact information:   [address] [place] [address] [phone]         Discharge Instructions  and  Discharge Medications         Discharge Instructions    Discharge instructions    Complete by:  As directed   Follow with Primary MD Kaleen Mask, MD in 7 days   Get CBC, CMP,  checked  by Primary MD next visit.    Activity: Touchdown weightbearing right leg   Disposition Home    Diet: Heart Healthy  , with feeding assistance and aspiration precautions.  For Heart failure patients - Check your Weight same time everyday, if you gain over 2 pounds, or you develop in leg swelling, experience more shortness of breath or chest pain, call your Primary MD immediately. Follow Cardiac Low Salt Diet and 1.5 lit/day fluid restriction.   On your next visit with your primary care physician please Get Medicines reviewed and adjusted.   Please request your Prim.MD to go over all Hospital Tests and Procedure/Radiological results at the follow up, please get all Hospital records sent to your Prim MD by signing hospital release before you go home.   If you experience worsening of your admission symptoms, develop shortness of breath, life threatening emergency, suicidal or  homicidal thoughts you must seek medical attention immediately by calling 911 or calling your MD immediately  if symptoms less severe.  You Must read complete instructions/literature along with all the possible adverse reactions/side effects for all the Medicines  you take and that have been prescribed to you. Take any new Medicines after you have completely understood and accpet all the possible adverse reactions/side effects.   Do not drive, operating heavy machinery, perform activities at heights, swimming or participation in water activities or provide baby sitting services if your were admitted for syncope or siezures until you have seen by Primary MD or a Neurologist and advised to do so again.  Do not drive when taking Pain medications.    Do not take more than prescribed Pain, Sleep and Anxiety Medications  Special Instructions: If you have smoked or chewed Tobacco  in the last 2 yrs please stop smoking, stop any regular Alcohol  and or any Recreational drug use.  Wear Seat belts while driving.   Please note  You were cared for by a hospitalist during your hospital stay. If you have any questions about your discharge medications or the care you received while you were in the hospital after you are discharged, you can call the unit and asked to speak with the hospitalist on call if the hospitalist that took care of you is not available. Once you are discharged, your primary care physician will handle any further medical issues. Please note that NO REFILLS for any discharge medications will be authorized once you are discharged, as it is imperative that you return to your primary care physician (or establish a relationship with a primary care physician if you do not have one) for your aftercare needs so that they can reassess your need for medications and monitor your lab values.            Medication List    STOP taking these medications        hydrochlorothiazide 12.5 MG capsule    Commonly known as:  MICROZIDE      TAKE these medications        dicyclomine 10 MG capsule  Commonly known as:  BENTYL  Take 1 cap by mouth every 4-6 hrs PRN cramps     fluticasone 110 MCG/ACT inhaler  Commonly known as:  FLOVENT HFA  Inhale 2 puffs into the lungs 2 (two) times daily.     Guaifenesin 1200 MG Tb12  Take 1 tablet (1,200 mg total) by mouth 2 (two) times daily at 10 AM and 5 PM.     ipratropium-albuterol 0.5-2.5 (3) MG/3ML Soln  Commonly known as:  DUONEB  Take 3 mLs by nebulization 4 (four) times daily.     lactose free nutrition Liqd  Take 237 mLs by mouth 2 (two) times daily between meals.     meloxicam 15 MG tablet  Commonly known as:  MOBIC  Take 1 tablet (15 mg total) by mouth daily.     methocarbamol 500 MG tablet  Commonly known as:  ROBAXIN  Take 1 tablet (500 mg total) by mouth 2 (two) times daily.     mometasone 50 MCG/ACT nasal spray  Commonly known as:  NASONEX  Place 2 sprays into the nose daily.     nitrofurantoin (macrocrystal-monohydrate) 100 MG capsule  Commonly known as:  MACROBID  Take 1 capsule (100 mg total) by mouth every 12 (twelve) hours.     oxyCODONE 15 MG immediate release tablet  Commonly known as:  ROXICODONE  Take 1 tablet (15 mg total) by mouth every 6 (six) hours as needed for pain.     pantoprazole 40 MG tablet  Commonly known as:  PROTONIX  Take 1 tablet (40 mg total)  by mouth daily at 6 (six) AM.     Tiotropium Bromide-Olodaterol 2.5-2.5 MCG/ACT Aers  Commonly known as:  STIOLTO RESPIMAT  Inhale 2 puffs into the lungs daily.     VENTOLIN HFA 108 (90 Base) MCG/ACT inhaler  Generic drug:  albuterol  Inhale 2 puffs into the lungs every 6 (six) hours as needed for wheezing or shortness of breath.     albuterol (2.5 MG/3ML) 0.083% nebulizer solution  Commonly known as:  PROVENTIL  Take 3 mLs (2.5 mg total) by nebulization every 6 (six) hours as needed for shortness of breath. For shortness of breath     Vitamin  D3 3000 units Tabs  Take 3,000 Units by mouth daily.     VITAMIN E PO  Take 1 capsule by mouth daily.          Diet and Activity recommendation: See Discharge Instructions above   Consults obtained -  Orthopedics Dr. Margarita Rana   Major procedures and Radiology Reports - PLEASE review detailed and final reports for all details, in brief -      Dg Chest 2 View  08/19/2015  CLINICAL DATA:  Status post fall, with concern for chest injury. Initial encounter. EXAM: CHEST  2 VIEW COMPARISON:  Chest radiograph performed 08/01/2015 FINDINGS: The lungs are hyperexpanded, with flattening of the hemidiaphragms, compatible with COPD. Peribronchial thickening is noted. Mild scarring is noted at the lung apices. There is no evidence of pleural effusion or pneumothorax. The cardiomediastinal silhouette is within normal limits. No acute osseous abnormalities are seen. There is chronic deformity of the left ninth posterolateral rib. IMPRESSION: 1. No acute cardiopulmonary process seen. 2. Findings of COPD, with mild scarring at the lung apices. Electronically Signed   By: Roanna Raider M.D.   On: 08/19/2015 19:10   Dg Chest 2 View  08/01/2015  CLINICAL DATA:  Trip over oxygen tank this morning. Left upper chest pain and bruising. EXAM: CHEST  2 VIEW COMPARISON:  CT 05/22/2015 FINDINGS: Questioned rib fracture on shoulder radiograph not well seen. The lungs are hyperinflated with emphysema. Heart size and mediastinal contours are normal. No pneumothorax or pleural effusion. Calcified granuloma in the left upper lobe. No focal airspace disease. IMPRESSION: 1. Emphysema, no acute intrathoracic process. 2. The questioned rib fracture on shoulder radiograph is not well seen. Electronically Signed   By: Rubye Oaks M.D.   On: 08/01/2015 04:28   Ct Head Wo Contrast  08/01/2015  CLINICAL DATA:  Trip over oxygen tubing resulting in fall. Head injury with hematoma left periorbital region. Now with  headache. EXAM: CT HEAD WITHOUT CONTRAST CT CERVICAL SPINE WITHOUT CONTRAST TECHNIQUE: Multidetector CT imaging of the head and cervical spine was performed following the standard protocol without intravenous contrast. Multiplanar CT image reconstructions of the cervical spine were also generated. COMPARISON:  Head CT 10/13/2012 FINDINGS: CT HEAD FINDINGS No intracranial hemorrhage, mass effect, or midline shift. No hydrocephalus. The basilar cisterns are patent. No evidence of territorial infarct. No intracranial fluid collection. Left periorbital edema without subjacent fracture. Calvarium is intact. Included paranasal sinuses and mastoid air cells are well aerated. CT CERVICAL SPINE FINDINGS No fracture or acute subluxation. The dens is intact. There are no jumped or perched facets. Disc space narrowing at C4-C5 and C5-C6 with endplate spurring. Minimal anterolisthesis of C4 on C5 appears degenerative. There is scattered facet arthropathy, left greater than right. No prevertebral soft tissue edema. Emphysema and scarring at the lung apices. IMPRESSION: 1.  No acute intracranial  abnormality. 2. Degenerative change in the cervical spine without acute fracture or subluxation. Electronically Signed   By: Rubye Oaks M.D.   On: 08/01/2015 05:03   Ct Cervical Spine Wo Contrast  08/01/2015  CLINICAL DATA:  Trip over oxygen tubing resulting in fall. Head injury with hematoma left periorbital region. Now with headache. EXAM: CT HEAD WITHOUT CONTRAST CT CERVICAL SPINE WITHOUT CONTRAST TECHNIQUE: Multidetector CT imaging of the head and cervical spine was performed following the standard protocol without intravenous contrast. Multiplanar CT image reconstructions of the cervical spine were also generated. COMPARISON:  Head CT 10/13/2012 FINDINGS: CT HEAD FINDINGS No intracranial hemorrhage, mass effect, or midline shift. No hydrocephalus. The basilar cisterns are patent. No evidence of territorial infarct. No  intracranial fluid collection. Left periorbital edema without subjacent fracture. Calvarium is intact. Included paranasal sinuses and mastoid air cells are well aerated. CT CERVICAL SPINE FINDINGS No fracture or acute subluxation. The dens is intact. There are no jumped or perched facets. Disc space narrowing at C4-C5 and C5-C6 with endplate spurring. Minimal anterolisthesis of C4 on C5 appears degenerative. There is scattered facet arthropathy, left greater than right. No prevertebral soft tissue edema. Emphysema and scarring at the lung apices. IMPRESSION: 1.  No acute intracranial abnormality. 2. Degenerative change in the cervical spine without acute fracture or subluxation. Electronically Signed   By: Rubye Oaks M.D.   On: 08/01/2015 05:03   Mr Brain Wo Contrast  08/01/2015  CLINICAL DATA:  Head trauma. Patient fell and hit head yesterday in kitchen. Worsening symptoms. EXAM: MRI HEAD WITHOUT CONTRAST TECHNIQUE: Multiplanar, multiecho pulse sequences of the brain and surrounding structures were obtained without intravenous contrast. COMPARISON:  CT head earlier today. FINDINGS: No evidence for acute infarction, hemorrhage, mass lesion, hydrocephalus, or extra-axial fluid. Slight premature atrophy. Mild subcortical and periventricular T2 and FLAIR hyperintensities, likely chronic microvascular ischemic change. Pituitary, pineal, and cerebellar tonsils unremarkable. No upper cervical lesions. Flow voids are maintained throughout the carotid, basilar, and vertebral arteries. There are no areas of chronic hemorrhage. Unremarkable paranasal sinuses and mastoids. No orbital abnormality, other than slight LEFT preseptal periorbital hematoma/soft tissue swelling. The scalp soft tissues are otherwise unremarkable. IMPRESSION: LEFT periorbital edema without acute intracranial findings. No intracranial hemorrhage or visible brain contusion. Mild subcortical and periventricular T2 and FLAIR hyperintensities, likely  chronic microvascular ischemic change. Electronically Signed   By: Elsie Stain M.D.   On: 08/01/2015 18:15   Dg Shoulder Left  08/01/2015  CLINICAL DATA:  Trip over oxygen tank this morning. Left shoulder and upper chest pain. EXAM: LEFT SHOULDER - 2+ VIEW COMPARISON:  None. FINDINGS: No acute fracture or dislocation of the shoulder. Acromioclavicular alignment is maintained. There is a probable nondisplaced fracture of anterior left tentatively identified third rib. IMPRESSION: 1. No fracture or subluxation of the shoulder. 2. Probable nondisplaced left anterior third rib fracture. Electronically Signed   By: Rubye Oaks M.D.   On: 08/01/2015 04:26   Dg Hand Complete Left  08/01/2015  CLINICAL DATA:  Trip over oxygen tank this morning. Left hand pain and bruising. EXAM: LEFT HAND - COMPLETE 3+ VIEW COMPARISON:  None. FINDINGS: There is no evidence of fracture or dislocation. There is no evidence of arthropathy or other focal bone abnormality. Soft tissues are unremarkable. IMPRESSION: Negative radiographs of the left hand. Electronically Signed   By: Rubye Oaks M.D.   On: 08/01/2015 04:29   Dg Hip Unilat With Pelvis 2-3 Views Left  08/01/2015  CLINICAL DATA:  Trip over oxygen tank this morning. Now with left hip pain bruising. EXAM: DG HIP (WITH OR WITHOUT PELVIS) 2-3V LEFT COMPARISON:  None. FINDINGS: The cortical margins of the bony pelvis and left hip are intact. No fracture. Pubic symphysis and sacroiliac joints are congruent. Both femoral heads are well-seated in the respective acetabula. The bones are under mineralized. IMPRESSION: No pelvic or left hip fracture. Electronically Signed   By: Rubye Oaks M.D.   On: 08/01/2015 04:24   Dg Hip Unilat With Pelvis 2-3 Views Right  08/19/2015  CLINICAL DATA:  Status post fall, with right hip pain. Initial encounter. EXAM: DG HIP (WITH OR WITHOUT PELVIS) 2-3V RIGHT COMPARISON:  None. FINDINGS: There is a mildly comminuted subcapital fracture  of the right femoral neck, with mild transcervical extension. Both femoral heads are seated normally within their respective acetabula. No significant degenerative change is appreciated. The sacroiliac joints are unremarkable in appearance. The visualized bowel gas pattern is grossly unremarkable in appearance. IMPRESSION: Mildly comminuted subcapital fracture of the right femoral neck, with mild transcervical extension. Electronically Signed   By: Roanna Raider M.D.   On: 08/19/2015 18:24    Micro Results    No results found for this or any previous visit (from the past 240 hour(s)).     Today   Subjective:   [name] today reports her right hip pain is mainly upon ambulating her leg.  Objective:   Blood pressure 93/53, pulse 79, temperature 97.8 F (36.6 C), temperature source Oral, resp. rate 18, last menstrual period 05/29/2010, SpO2 96 %.   Intake/Output Summary (Last 24 hours) at 08/22/15 0948 Last data filed at 08/22/15 1610  Gross per 24 hour  Intake    790 ml  Output    550 ml  Net    240 ml    Exam General exam: Awake, alert, no apparent distress Respiratory system: Clear to auscultation. Respiratory effort normal. Cardiovascular system: S1 & S2 heard, RRR. No JVD, murmurs, rubs, gallops or clicks. No pedal edema. Gastrointestinal system: Abdomen is nondistended, soft and nontender. Bowel sounds present  Central nervous system: Alert and oriented. No focal neurological deficits. Psychiatry: labile  Data Review   CBC w Diff:  Lab Results  Component Value Date   WBC 7.0 08/22/2015   HGB 9.8* 08/22/2015   HCT 30.7* 08/22/2015   PLT 127* 08/22/2015   LYMPHOPCT 12 08/19/2015   MONOPCT 5 08/19/2015   EOSPCT 0 08/19/2015   BASOPCT 0 08/19/2015    CMP:  Lab Results  Component Value Date   NA 134* 08/22/2015   K 3.6 08/22/2015   CL 99* 08/22/2015   CO2 28 08/22/2015   BUN 6 08/22/2015   CREATININE 0.55 08/22/2015   CREATININE 0.66 12/13/2014    PROT 6.6 03/09/2014   ALBUMIN 3.7 03/09/2014   BILITOT 1.0 03/09/2014   ALKPHOS 75 03/09/2014   AST 30 03/09/2014   ALT 14 03/09/2014  .   Total Time in preparing paper work, data evaluation and todays exam - No charge  Randol Kern, Antoniette Peake M.D on 08/22/2015 at 9:48 AM  [facility]   Office  [phone]

## 2015-08-22 NOTE — Progress Notes (Signed)
Discharge instructions given. Pt verbalized understanding and all questions were answered.  

## 2015-08-22 NOTE — Progress Notes (Signed)
Patient started voiding on her own around 0215 this am.  It was too late to discharge patient to home. Patient will be able to discharge today.  She has continued to void via bedpan.

## 2015-08-22 NOTE — Care Management (Signed)
Patient husband irate and angry with CM concerning discharge plan, CM attempted to explain why HH services wont be provided, to no avail. Husband states he will be filing complaint, asked for CM's name, which was provided. Situation was fueled by [employer], who kept insisting patient couldn't just be "left " at home.

## 2015-08-31 ENCOUNTER — Emergency Department (HOSPITAL_COMMUNITY): Payer: Medicaid Other

## 2015-08-31 ENCOUNTER — Emergency Department (HOSPITAL_COMMUNITY)
Admission: EM | Admit: 2015-08-31 | Discharge: 2015-09-01 | Disposition: A | Discharge status: Home / Self Care | Payer: Medicaid Other | Attending: Emergency Medicine | Admitting: Emergency Medicine

## 2015-08-31 ENCOUNTER — Encounter (HOSPITAL_COMMUNITY): Payer: Self-pay

## 2015-08-31 DIAGNOSIS — M1990 Unspecified osteoarthritis, unspecified site: Secondary | ICD-10-CM | POA: Insufficient documentation

## 2015-08-31 DIAGNOSIS — J449 Chronic obstructive pulmonary disease, unspecified: Secondary | ICD-10-CM | POA: Diagnosis not present

## 2015-08-31 DIAGNOSIS — Z8673 Personal history of transient ischemic attack (TIA), and cerebral infarction without residual deficits: Secondary | ICD-10-CM | POA: Insufficient documentation

## 2015-08-31 DIAGNOSIS — R0602 Shortness of breath: Secondary | ICD-10-CM | POA: Insufficient documentation

## 2015-08-31 DIAGNOSIS — M549 Dorsalgia, unspecified: Secondary | ICD-10-CM | POA: Diagnosis present

## 2015-08-31 DIAGNOSIS — F329 Major depressive disorder, single episode, unspecified: Secondary | ICD-10-CM | POA: Diagnosis not present

## 2015-08-31 DIAGNOSIS — M25561 Pain in right knee: Secondary | ICD-10-CM | POA: Insufficient documentation

## 2015-08-31 DIAGNOSIS — M25562 Pain in left knee: Secondary | ICD-10-CM | POA: Insufficient documentation

## 2015-08-31 DIAGNOSIS — Z87891 Personal history of nicotine dependence: Secondary | ICD-10-CM | POA: Insufficient documentation

## 2015-08-31 DIAGNOSIS — M25551 Pain in right hip: Secondary | ICD-10-CM | POA: Insufficient documentation

## 2015-08-31 LAB — COMPREHENSIVE METABOLIC PANEL
ALT: 61 U/L — ABNORMAL HIGH (ref 14–54)
ANION GAP: 9 (ref 5–15)
AST: 30 U/L (ref 15–41)
Albumin: 4.1 g/dL (ref 3.5–5.0)
Alkaline Phosphatase: 89 U/L (ref 38–126)
BILIRUBIN TOTAL: 0.8 mg/dL (ref 0.3–1.2)
BUN: 12 mg/dL (ref 6–20)
CHLORIDE: 97 mmol/L — AB (ref 101–111)
CO2: 28 mmol/L (ref 22–32)
Calcium: 9.3 mg/dL (ref 8.9–10.3)
Creatinine, Ser: 0.59 mg/dL (ref 0.44–1.00)
Glucose, Bld: 114 mg/dL — ABNORMAL HIGH (ref 65–99)
POTASSIUM: 3.6 mmol/L (ref 3.5–5.1)
Sodium: 134 mmol/L — ABNORMAL LOW (ref 135–145)
TOTAL PROTEIN: 7.3 g/dL (ref 6.5–8.1)

## 2015-08-31 LAB — CBC WITH DIFFERENTIAL/PLATELET
BASOS ABS: 0 10*3/uL (ref 0.0–0.1)
Basophils Relative: 0 %
EOS PCT: 1 %
Eosinophils Absolute: 0.1 10*3/uL (ref 0.0–0.7)
HEMATOCRIT: 37.4 % (ref 36.0–46.0)
Hemoglobin: 12.3 g/dL (ref 12.0–15.0)
LYMPHS ABS: 2.2 10*3/uL (ref 0.7–4.0)
LYMPHS PCT: 26 %
MCH: 29.5 pg (ref 26.0–34.0)
MCHC: 32.9 g/dL (ref 30.0–36.0)
MCV: 89.7 fL (ref 78.0–100.0)
MONO ABS: 0.9 10*3/uL (ref 0.1–1.0)
MONOS PCT: 11 %
NEUTROS ABS: 5.3 10*3/uL (ref 1.7–7.7)
Neutrophils Relative %: 62 %
PLATELETS: 432 10*3/uL — AB (ref 150–400)
RBC: 4.17 MIL/uL (ref 3.87–5.11)
RDW: 14.3 % (ref 11.5–15.5)
WBC: 8.5 10*3/uL (ref 4.0–10.5)

## 2015-08-31 LAB — D-DIMER, QUANTITATIVE (NOT AT ARMC): D DIMER QUANT: 0.76 ug{FEU}/mL — AB (ref 0.00–0.50)

## 2015-08-31 LAB — I-STAT CG4 LACTIC ACID, ED: LACTIC ACID, VENOUS: 1.8 mmol/L (ref 0.5–1.9)

## 2015-08-31 MED ORDER — ONDANSETRON HCL 4 MG/2ML IJ SOLN
4.0000 mg | Freq: Once | INTRAMUSCULAR | Status: AC
  Administered 2015-08-31: 4 mg via INTRAVENOUS
  Filled 2015-08-31: qty 2

## 2015-08-31 MED ORDER — IOPAMIDOL (ISOVUE-370) INJECTION 76%
100.0000 mL | Freq: Once | INTRAVENOUS | Status: AC | PRN
  Administered 2015-09-01: 100 mL via INTRAVENOUS

## 2015-08-31 MED ORDER — HYDROMORPHONE HCL 1 MG/ML IJ SOLN
1.0000 mg | Freq: Once | INTRAMUSCULAR | Status: AC
Start: 2015-08-31 — End: 2015-08-31
  Administered 2015-08-31: 1 mg via INTRAVENOUS
  Filled 2015-08-31: qty 1

## 2015-08-31 NOTE — ED Provider Notes (Signed)
CSN: [id number]     Arrival date & time 08/31/15  2041 History   First MD Initiated Contact with Patient 08/31/15 2123     Chief Complaint  Patient presents with  . Back Pain     (Consider location/radiation/quality/duration/timing/severity/associated sxs/prior Treatment) HPI 55 year old female who presents with fever and mid to upper back pain. History of COPD on 2 L home oxygen, fibromyalgia, depression. Was recently discharged from the hospital June 22 after management for a right femoral neck fracture. She did not want surgical management at that time and was discharged home. States that over this past week she has had decreased appetite, malaise, generalized weakness. Initially started having low-grade fevers and had a subsequent fever of 102 Fahrenheit today. Has noted some mild increased shortness of breath with mid and upper back pain over the posterior ribs. Has not had cough, congestion, sore throat or runny nose. Denies chest pain, nausea or vomiting, diarrhea. Has not noted dysuria or urinary frequency. Sent to ED by her primary care doctor for further evaluation. No fall or trauma. Past Medical History  Diagnosis Date  . TIA (transient ischemic attack)   . GERD (gastroesophageal reflux disease)   . Hepatomegaly     hx  . Colonic inertia   . Lung abscess (HCC)     "calcified over"; bilateral   . COPD (chronic obstructive pulmonary disease) (HCC)   . Stroke [facility]) 2009    TIA  . PTSD (post-traumatic stress disorder)   . Bipolar disorder (HCC)   . Depression   . Chronic headache   . Seizures (HCC)     in past  . Fibromyalgia   . Iron deficiency anemia   . Arthritis   . Acute ischemic colitis (HCC) 11/01/2012  . Marijuana abuse 2014    positive screen in hospital  . TIA (transient ischemic attack)    Past Surgical History  Procedure Laterality Date  . 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 Cesarean section      x 2  .  Esophagogastroduodenoscopy  12/23/2011    Procedure: ESOPHAGOGASTRODUODENOSCOPY (EGD);  Surgeon: Hart Carwin, MD;  Location: [facility];  Service: Endoscopy;  Laterality: N/A;  . Colonoscopy    . Colonoscopy N/A 11/01/2012    Procedure: COLONOSCOPY;  Surgeon: Iva Boop, MD;  Location: [facility];  Service: Endoscopy;  Laterality: N/A;   Family History  Problem Relation Age of Onset  . Emphysema Father   . Asthma Son   . Clotting disorder Mother   . Rheum arthritis Sister   . Rheum arthritis Brother   . Ovarian cancer Sister    Social History  Substance Use Topics  . Smoking status: Former Smoker -- 1.00 packs/day for 39 years    Types: Cigarettes    Quit date: 01/30/2014  . Smokeless tobacco: Never Used     Comment: has not smoked in 6 days 07/27/14  . Alcohol Use: No   OB History    No data available     Review of Systems 10/14 systems reviewed and are negative other than those stated in the HPI   Allergies  Oxycodone-acetaminophen; Ciprofloxacin; Codeine; Hydrocodone; Levaquin; and Quinolones  Home Medications   Prior to Admission medications   Medication Sig Start Date End Date Taking? Authorizing Provider  albuterol (PROVENTIL) (2.5 MG/3ML) 0.083% nebulizer solution Take 3 mLs (2.5 mg total) by nebulization every 6 (six) hours as needed for  shortness of breath. For shortness of breath 12/20/13   Alison Murray, MD  albuterol (VENTOLIN HFA) 108 (90 BASE) MCG/ACT inhaler Inhale 2 puffs into the lungs every 6 (six) hours as needed for wheezing or shortness of breath.     Historical Provider, MD  Cholecalciferol (VITAMIN D3) 3000 units TABS Take 3,000 Units by mouth daily.    Historical Provider, MD  dicyclomine (BENTYL) 10 MG capsule Take 1 cap by mouth every 4-6 hrs PRN cramps 04/25/14   Hart Carwin, MD  fluticasone (FLOVENT HFA) 110 MCG/ACT inhaler Inhale 2 puffs into the lungs 2 (two) times daily. 08/01/15   Kalman Shan, MD  Guaifenesin 1200 MG TB12 Take 1  tablet (1,200 mg total) by mouth 2 (two) times daily at 10 AM and 5 PM. Patient taking differently: Take 1 tablet by mouth 2 (two) times daily as needed (cough).  07/14/14   Linwood Dibbles, MD  ipratropium-albuterol (DUONEB) 0.5-2.5 (3) MG/3ML SOLN Take 3 mLs by nebulization 4 (four) times daily. Patient not taking: Reported on 08/19/2015 05/14/15   Kalman Shan, MD  lactose free nutrition (BOOST PLUS) LIQD Take 237 mLs by mouth 2 (two) times daily between meals. 04/03/13   Nishant Dhungel, MD  meloxicam (MOBIC) 15 MG tablet Take 1 tablet (15 mg total) by mouth daily. Patient not taking: Reported on 08/19/2015 08/01/15   April Palumbo, MD  methocarbamol (ROBAXIN) 500 MG tablet Take 1 tablet (500 mg total) by mouth 2 (two) times daily. Patient not taking: Reported on 08/19/2015 08/01/15   April Palumbo, MD  mometasone (NASONEX) 50 MCG/ACT nasal spray Place 2 sprays into the nose daily. 01/04/14   Loren Racer, MD  nitrofurantoin, macrocrystal-monohydrate, (MACROBID) 100 MG capsule Take 1 capsule (100 mg total) by mouth every 12 (twelve) hours. 08/21/15   Leana Roe Elgergawy, MD  oxyCODONE (ROXICODONE) 15 MG immediate release tablet Take 1 tablet (15 mg total) by mouth every 6 (six) hours as needed for pain. 08/21/15   Leana Roe Elgergawy, MD  pantoprazole (PROTONIX) 40 MG tablet Take 1 tablet (40 mg total) by mouth daily at 6 (six) AM. Patient taking differently: Take 40 mg by mouth daily as needed (indigestion).  11/02/12   Dorothea Ogle, MD  Tiotropium Bromide-Olodaterol (STIOLTO RESPIMAT) 2.5-2.5 MCG/ACT AERS Inhale 2 puffs into the lungs daily. 07/09/15   Kalman Shan, MD  VITAMIN E PO Take 1 capsule by mouth daily.    Historical Provider, MD   BP 106/64 mmHg  Pulse 85  Temp(Src) 98.7 F (37.1 C) (Oral)  Resp 20  SpO2 97%  LMP 05/29/2010 Physical Exam Physical Exam  Nursing note and vitals reviewed. Constitutional: Well developed, well nourished, non-toxic, and in no acute distress Head:  Normocephalic and atraumatic.  Mouth/Throat: Oropharynx is clear and moist.  Neck: Normal range of motion. Neck supple.  Cardiovascular: Normal rate and regular rhythm.   Pulmonary/Chest: Effort normal and breath sounds normal. Posterior mid to upper thoracic pain over bilateral posterior ribs.  Abdominal: Soft. There is no tenderness. There is no rebound and no guarding. NO CVA tenderness. Musculoskeletal: No deformities. Neurological: Alert, no facial droop, fluent speech, moves all extremities symmetrically Skin: Skin is warm and dry. No rash or skin changes. Psychiatric: Cooperative  ED Course  Procedures (including critical care time) Labs Review Labs Reviewed  CBC WITH DIFFERENTIAL/PLATELET - Abnormal; Notable for the following:    Platelets 432 (*)    All other components within normal limits  COMPREHENSIVE METABOLIC PANEL - Abnormal;  Notable for the following:    Sodium 134 (*)    Chloride 97 (*)    Glucose, Bld 114 (*)    ALT 61 (*)    All other components within normal limits  D-DIMER, QUANTITATIVE (NOT AT [facility]) - Abnormal; Notable for the following:    D-Dimer, Quant 0.76 (*)    All other components within normal limits  URINE CULTURE  URINALYSIS, ROUTINE W REFLEX MICROSCOPIC (NOT AT [facility])  I-STAT CG4 LACTIC ACID, ED    Imaging Review Dg Chest 2 View  08/31/2015  CLINICAL DATA:  Upper back pain and dyspnea, onset earlier today. EXAM: CHEST  2 VIEW COMPARISON:  08/19/2015 FINDINGS: There is marked hyperinflation. Emphysematous changes are present throughout, as well as fibrotic appearing interstitial coarsening. No superimposed acute finding. No consolidation. No effusion. No pneumothorax. Normal pulmonary vasculature. IMPRESSION: Hyperinflation and severe emphysematous disease. No superimposed acute findings. Electronically Signed   By: Ellery Plunk M.D.   On: 08/31/2015 22:41   Ct Angio Chest Pe W/cm &/or Wo Cm  09/01/2015  CLINICAL DATA:  Lower back pain. Nausea  vomiting and fever today. Recent right femoral fracture. EXAM: CT ANGIOGRAPHY CHEST WITH CONTRAST TECHNIQUE: Multidetector CT imaging of the chest was performed using the standard protocol during bolus administration of intravenous contrast. Multiplanar CT image reconstructions and MIPs were obtained to evaluate the vascular anatomy. CONTRAST:  100 mL Isovue 370 intravenous COMPARISON:  05/22/2015 FINDINGS: Cardiovascular: There is good opacification of the pulmonary arteries. There is no pulmonary embolism. The thoracic aorta is normal in caliber and intact. Lungs: Severe hyperinflation and extensive emphysematous disease. Stable scarring bilaterally. New curvilinear opacity in the right base is probably atelectatic. Central airways: Patent Effusions: None Lymphadenopathy: None Esophagus: Unremarkable Upper abdomen: No significant abnormality Musculoskeletal: No significant abnormality. Review of the MIP images confirms the above findings. IMPRESSION: Negative for acute pulmonary embolism. There is severe emphysematous disease with hyperinflation. New curvilinear opacity in the right base is probably atelectatic. Electronically Signed   By: Ellery Plunk M.D.   On: 09/01/2015 00:29   Dg Knee Complete 4 Views Left  09/01/2015  CLINICAL DATA:  55 year old female with fall and left knee pain. EXAM: LEFT KNEE - COMPLETE 4+ VIEW COMPARISON:  None. FINDINGS: There is no acute fracture or dislocation. The bones are osteopenic. There is narrowing of the knee compartments compatible with mild osteoarthritic changes. There is no joint effusion. The soft tissues appear unremarkable. IMPRESSION: No acute fracture or dislocation. Electronically Signed   By: Elgie Collard M.D.   On: 09/01/2015 02:59   Dg Knee Complete 4 Views Right  09/01/2015  CLINICAL DATA:  55 year old female with bilateral knee pain status post fall. EXAM: RIGHT KNEE - COMPLETE 4+ VIEW COMPARISON:  None. FINDINGS: There is no acute fracture or  dislocation. The bones are osteopenic. There is mild narrowing of the medial and lateral compartments. There is no joint effusion. Soft tissues appear unremarkable. IMPRESSION: No acute fracture or dislocation. Electronically Signed   By: Elgie Collard M.D.   On: 09/01/2015 02:58   Dg Hip Unilat With Pelvis 2-3 Views Right  08/31/2015  CLINICAL DATA:  Larey Seat 2 weeks ago.  Right hip pain. EXAM: DG HIP (WITH OR WITHOUT PELVIS) 2-3V RIGHT COMPARISON:  08/19/2015 FINDINGS: The subcapital right hip fracture is again evident. There has been mild settling/impaction of the fracture compare to the prior study. No dislocation. No new fracture. Sacroiliac joints and pubic symphysis appear intact. IMPRESSION: Mild settling of the  subcapital right hip fracture with slightly increased impaction compared to 08/19/2015. Electronically Signed   By: Ellery Plunk M.D.   On: 08/31/2015 22:45   I have personally reviewed and evaluated these images and lab results as part of my medical decision-making.   EKG Interpretation   Date/Time:  Saturday August 31 2015 22:38:24 EDT Ventricular Rate:  86 PR Interval:    QRS Duration: 82 QT Interval:  377 QTC Calculation: 451 R Axis:   86 Text Interpretation:  Sinus rhythm Confirmed by Omarius Grantham MD, Annabelle Harman ([id number]) on  08/31/2015 11:07:58 PM      MDM   Final diagnoses:  Back pain, unspecified location  Pain in left knee  Pain in right knee  Pain in right hip    55 year old female who presents with fever at home and pain in her mid to upper thoracic posterior chest wall. Is afebrile here in the emergency department with normal vital signs. Breathing comfortably on her home oxygen. Chronically ill but does not appear acutely ill. Pain somewhat reproducible with palpation in her mid to upper posterior chest wall. Infectious workup pursued. No leukocytosis, no evidence of infiltrate on chest x-ray, normal lactate. Urinalysis is pending. No other major electrolyte or metabolic  derangements. Given her mobilization, a d-dimer was sent to evaluate for underlying PE. D-dimer is positive and she is undergoing CTA of the chest to rule out PE. If these studies are unremarkable, she does not appear to have any signs of sepsis or serious infection. She has remained afebrile with normal vital signs and continues to be not ill appearing. I feel that if all of her workup is negative that she may be stable for discharge home with close follow-up by her PCP.   Lavera Guise, MD 09/01/15 [phone]

## 2015-08-31 NOTE — ED Notes (Signed)
Bed: WA02 Expected date: 08/31/15 Expected time:  Means of arrival:  Comments: 86 F back pain/non weight bearing.

## 2015-08-31 NOTE — ED Notes (Addendum)
BIB [facility], w/ c/o 9/10 lower back pain, n/v, and fever earlier today. Pt took x1 15mg  Oxycodone this AM for her pain. Pt was advised by her PCP to take tylenol for fever. Pt had right femur fracture on 6/19. Pt arrives A+OX4, speaking in complete sentences.

## 2015-09-01 ENCOUNTER — Emergency Department (HOSPITAL_COMMUNITY): Payer: Medicaid Other

## 2015-09-01 LAB — URINALYSIS, ROUTINE W REFLEX MICROSCOPIC
BILIRUBIN URINE: NEGATIVE
Glucose, UA: NEGATIVE mg/dL
Hgb urine dipstick: NEGATIVE
Ketones, ur: NEGATIVE mg/dL
Leukocytes, UA: NEGATIVE
NITRITE: NEGATIVE
Protein, ur: NEGATIVE mg/dL
SPECIFIC GRAVITY, URINE: 1.006 (ref 1.005–1.030)
pH: 6 (ref 5.0–8.0)

## 2015-09-01 MED ORDER — FENTANYL CITRATE (PF) 100 MCG/2ML IJ SOLN
50.0000 ug | Freq: Once | INTRAMUSCULAR | Status: AC
  Administered 2015-09-01: 50 ug via INTRAVENOUS
  Filled 2015-09-01: qty 2

## 2015-09-01 NOTE — ED Provider Notes (Signed)
Xray of each knee negative Pt stable for d/c home Will need to see her PCP for pain control as no acute injury tonight   Bethany Rhine, MD 09/01/15 [phone]

## 2015-09-01 NOTE — ED Provider Notes (Signed)
I assumed care in signout to f/u on labs/imaging CT chest negative No UTI noted Pt stable No fever here BP 117/77 mmHg  Pulse 98  Temp(Src) 98.1 F (36.7 C) (Oral)  Resp 14  SpO2 96%  LMP 05/29/2010 She is not septic appearing She is watching TV, no distress noted She is now requesting xray of both knees as she feels they were overlooked when she initially fell and had right hip fx She has full ROM of both knees without difficulty Will order xrays of knees and then d/c home   Zadie Rhine, MD 09/01/15 0202

## 2015-09-01 NOTE — ED Notes (Signed)
Patient transported to X-ray 

## 2015-09-01 NOTE — Discharge Instructions (Signed)

## 2015-09-02 LAB — URINE CULTURE

## 2015-10-15 ENCOUNTER — Ambulatory Visit (INDEPENDENT_AMBULATORY_CARE_PROVIDER_SITE_OTHER): Payer: Medicaid Other | Admitting: Internal Medicine

## 2015-10-15 ENCOUNTER — Encounter: Payer: Self-pay | Admitting: Internal Medicine

## 2015-10-15 VITALS — BP 122/82 | HR 71 | Temp 98.2°F | Ht 66.5 in | Wt 102.6 lb

## 2015-10-15 DIAGNOSIS — J449 Chronic obstructive pulmonary disease, unspecified: Secondary | ICD-10-CM | POA: Diagnosis not present

## 2015-10-15 NOTE — Progress Notes (Signed)
Subjective:     Patient ID: [name], female   DOB: 1961/01/12, 56 y.o.   MRN: [id number]  HPI   OV 12/04/2014  Chief Complaint  Patient presents with  . Follow-up    Pt former [facility] pt. Pt c/o pain under left breast. Pt c/o increase in SOB with activity. Pt denies cough.     55 year old female with MM alpha 1 phenotype. Reported severe COPD on oxygen. However I do not see any pulmonary function test in the chart. She presents for routine follow-up. She is to be followed by Dr. Jonathon Bellows in the past but now is a transfer of care after he left the practice. She says that her oxygen needs are unspecified. She also says that her inhalers are being changed to many times and she has no idea what she needs to be on. In addition she's had side effects to some inhalers. She is a rambling historian and it is unclear to me what she is trying to say. She also seems focused on some recent admission for ischemic colitis that has made her more deconditioned. She's been using a walker for the last 8 months to one year because of neuromuscular issues. Nevertheless overall his COPD stable.  Walking desaturation test using a walker but 185 feet 3 laps on room air: At one lap she desaturated below 88%. On 2 L she walk 2 laps and maintained a saturation    12/27/2014 Follow up : COPD Pt returns for 3 week follow up .  Had recent bronchitis , tx w/ abx , she is feeling better.  PFT  Today showed FEV1 at 17%, ratio 32, FVC 42%, no BD response. DLCO 13%.  This is decreased from last.  Previously on Spiriva and Pulmicort, no longer taking.  Can not tolerate powder inhalers.  Now on Dulera .  Can not take powders and insurance will not cover  respimat.  Declines flu shot today , wants to wait a little while .  Denies chest pain , orthopnea, edema or fever.      OV 05/14/2015  Chief Complaint  Patient presents with  . Follow-up    Pt here after PFT. Pt states her breathing is unchanged since last OV. Pt  saw TP in October in to adjust medications. Pt states her inhalers arent helping. Pt states her SOB is at baseline. Pt denies cough and CP/tightenss.    Follow-up very severe COPD  I last saw her in the fall 2016. That was my first visit with her because she is a transfer of care. She then followed up with nurse practitioner and Pulmicort function test showed very severe Gold stage IV COPD FEV1 17%. She subsequently had made several phone calls insisting only on seeing me. Getting upset that she could not see nurse practitioner. She wanted to change her nebulizers because Dulera was not working well for her. She says she does not like powdered inhalers. In addition she is very upset about how advanced his COPD might be. She thinks that lung functions are artificially low because of respiratory infection at that time. She is also upset that I only spent "few seconds with her" at her previous visit with me. Nevertheless overall COPD stable. She is worried about her prognosis. CAT  Score at 28 is stable  Review of the chart shows last CT chest May 2015 when she had possible left upper lobe nodule.    OV 07/09/2015  Chief Complaint  Patient presents with  .  Follow-up    Pt states the Qvar does not work and pt changed herself back to [place]. Pt states her breathing has improved since last OV.     Follow-up Gold stage IV COPD   Last visit I change her to DuoNeb was Qvar. She tells me that inhalers do not work well for her. She chokes on inhaler particles. She only wants mist based inhalers.  She stopped taking her Qvar and is now on Dulera long with the DuoNeb. In essence she is getting too much long-acting beta agonist. She feels overall a little bit better. She is frustrated that she has to be on inhalers. In addition she feels that the smoking did not cause her Gold stage IV COPD. She says in 2010 she [facility] for "blew up my lung". She feels that  these antibiotics are extremely unsafe to the lungs. She says that former FDA commissioner Natalia LeatherwoodKatherine hamburg deliberately suppressed this information because her husband at confident of interest to the pharmaceutical industry. According to the patient is a former Scientist, physiologicalDA commissioner is now in jail. She is asking about repeat pulmonary function test. COPD cat score is  ft she had CT chest in April 2017 at the lung nodule is calcified. I personally visualized the CT chest. She has coronary artery calcification but in November 2016 she had a cardiac stress test is normal.   OV 10/15/2015  Chief Complaint  Patient presents with  . COPD    breathing has been doing well.  Broke her hip so she has been laying around a lot.  She said o2 has not gone above 88% - 89% during the time she was laid up from hip broken.  Humidity aggravates her breathing, uses oxygen sometimes when walking, gets SOB, uses O2 mostly at night when sleeping.     55 year old female with  very severe COPD. She has multiple drug intolerance.since I last saw her between June and July she fell down and broke her right hip. She is allowing this for natural healing. She is reluctant to have surgery. She says that she's been cleared to weight bear from today. However she still sitting in a wheelchair. STiolto he is working well for her. However she is not doing the Pulmicort nebulizer. She is reluctant have the flu shot. She says she is losing some weight because of GI issues but is trying some natural roots and this is helping. COPD itself was not an exacerbation   CAT COPD Symptom & Quality of Life Score (GSK trademark) 0 is no burden. 5 is highest burden 12/04/2014  05/14/2015  07/09/2015   Never Cough -> Cough all the time 1 2 1   No phlegm in chest -> Chest is full of phlegm 3 3 3   No chest tightness -> Chest feels very tight 3 3 3   No dyspnea for 1 flight stairs/hill -> Very dyspneic for 1 flight of stairs 5 5 5   No limitations for ADL  at home -> Very limited with ADL at home 5 4 4   Confident leaving home -> Not at all confident leaving home 5 4 3   Sleep soundly -> Do not sleep soundly because of lung condition 3 4 3   Lots of Energy -> No energy at all 3 3 3   TOTAL Score (max 40)  28 28 25         has a past medical history of Acute ischemic colitis (HCC) (11/01/2012); Arthritis; Bipolar disorder (HCC);  Chronic headache; Colonic inertia; COPD (chronic obstructive pulmonary disease) (HCC); Depression; Fibromyalgia; GERD (gastroesophageal reflux disease); Hepatomegaly; Iron deficiency anemia; Lung abscess (HCC); Marijuana abuse (2014); PTSD (post-traumatic stress disorder); Seizures (HCC); Stroke [facility]) (2009); TIA (transient ischemic attack); and TIA (transient ischemic attack).   reports that she quit smoking about 20 months ago. Her smoking use included Cigarettes. She has a 39.00 pack-year smoking history. She has never used smokeless tobacco.  Past Surgical History:  Procedure Laterality Date  . CESAREAN SECTION     x 2  . COLONOSCOPY    . COLONOSCOPY N/A 11/01/2012   Procedure: COLONOSCOPY;  Surgeon: Iva Boop, MD;  Location: [facility] ENDOSCOPY;  Service: Endoscopy;  Laterality: N/A;  . ESOPHAGOGASTRODUODENOSCOPY  12/23/2011   Procedure: ESOPHAGOGASTRODUODENOSCOPY (EGD);  Surgeon: Hart Carwin, MD;  Location: [facility];  Service: Endoscopy;  Laterality: N/A;  . 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 Ciprofloxacin Other (See Comments)    Lift threatening  . Codeine Nausea And Vomiting  . Levaquin [Levofloxacin In D5w] Other (See Comments)    Life threatening; caused abscess in lungs  . Oxycodone-Acetaminophen Anaphylaxis, Itching and Nausea And Vomiting    Tylox (Pt states, "I can only take Demerol, Oxycontin, and Dilaudid")  . Quinolones Hives and Other (See Comments)    Damaged nerves and tendons; If have to take antibiotic, pt could take;  caused abscess in lungs  . Hydrocodone Nausea And Vomiting    Immunization History  Administered Date(s) Administered  . Influenza Split 12/01/2011  . Influenza Whole 01/11/2009, 11/28/2009, 11/01/2010, 12/26/2013  . Influenza,inj,Quad PF,36+ Mos 11/22/2012  . Pneumococcal Conjugate-13 07/27/2014    Family History  Problem Relation Age of Onset  . Emphysema Father   . Asthma Son   . Clotting disorder Mother   . Rheum arthritis Sister   . Rheum arthritis Brother   . Ovarian cancer Sister      Current Outpatient Prescriptions:  .  albuterol (PROVENTIL) (2.5 MG/3ML) 0.083% nebulizer solution, Take 3 mLs (2.5 mg total) by nebulization every 6 (six) hours as needed for shortness of breath. For shortness of breath, Disp: 75 mL, Rfl: 1 .  albuterol (VENTOLIN HFA) 108 (90 BASE) MCG/ACT inhaler, Inhale 2 puffs into the lungs every 6 (six) hours as needed for wheezing or shortness of breath. , Disp: , Rfl:  .  calcium citrate-vitamin D (CITRACAL+D) 315-200 MG-UNIT tablet, Take 1 tablet by mouth 2 (two) times daily., Disp: , Rfl:  .  Cholecalciferol (VITAMIN D3) 3000 units TABS, Take 3,000 Units by mouth daily., Disp: , Rfl:  .  dicyclomine (BENTYL) 10 MG capsule, Take 1 cap by mouth every 4-6 hrs PRN cramps, Disp: 60 capsule, Rfl: 0 .  lactose free nutrition (BOOST PLUS) LIQD, Take 237 mLs by mouth 2 (two) times daily between meals., Disp: 60 Can, Rfl: 0 .  oxyCODONE (ROXICODONE) 15 MG immediate release tablet, Take 1 tablet (15 mg total) by mouth every 6 (six) hours as needed for pain., Disp: 20 tablet, Rfl: 0 .  STIOLTO RESPIMAT 2.5-2.5 MCG/ACT AERS, , Disp: , Rfl: 1 .  VITAMIN E PO, Take 1 capsule by mouth daily., Disp: , Rfl:     Review of Systems     Objective:   Physical Exam  Constitutional: She is oriented to person, place, and time. She appears well-developed and well-nourished. No distress.  Thin  HENT:  Head: Normocephalic and atraumatic.  Right Ear: External ear normal.   Left Ear: External ear normal.  Mouth/Throat: Oropharynx is clear and moist. No oropharyngeal exudate.  Eyes: Conjunctivae and EOM are normal. Pupils are equal, round, and reactive to light. Right eye exhibits no discharge. Left eye exhibits no discharge. No scleral icterus.  Neck: Normal range of motion. Neck supple. No JVD present. No tracheal deviation present. No thyromegaly present.  Cardiovascular: Normal rate, regular rhythm, normal heart sounds and intact distal pulses.  Exam reveals no gallop and no friction rub.   No murmur heard. Pulmonary/Chest: Effort normal and breath sounds normal. No respiratory distress. She has no wheezes. She has no rales. She exhibits no tenderness.  Abdominal: Soft. Bowel sounds are normal. She exhibits no distension and no mass. There is no tenderness. There is no rebound and no guarding.  Musculoskeletal: Normal range of motion. She exhibits no edema or tenderness.  Sitting in wheel chair  Lymphadenopathy:    She has no cervical adenopathy.  Neurological: She is alert and oriented to person, place, and time. She has normal reflexes. No cranial nerve deficit. She exhibits normal muscle tone. Coordination normal.  Skin: Skin is warm and dry. No rash noted. She is not diaphoretic. No erythema. No pallor.  Psychiatric: Judgment and thought content normal.  Very talkative  Vitals reviewed.   Vitals:   10/15/15 1617  BP: 122/82  Pulse: 71  Temp: 98.2 F (36.8 C)  TempSrc: Oral  SpO2: 94%  Weight: 102 lb 9.6 oz (46.5 kg)  Height: 5' 6.5" (1.689 m)   Body mass index is 16.31 kg/m.      Assessment:       ICD-9-CM ICD-10-CM   1. COPD, very severe (HCC) 496 J44.9        Plan:      #COPD  Currently stable disease Continue stiolto daily; glad this is working [employer] not taking pulmicort nebulizer Use albueterol as needed Repeat PFT in 3 months Flu shot in fall but respect you want to wait till we meet again in 3  months   #Followup 3 months or sooner if needed  Dr. Kalman Shan, M.D., [facility].C.P Pulmonary and Critical Care Medicine Staff Physician [place] [facility] [place] Pulmonary and Critical Care Pager: [phone], If no answer or between  15:00h - 7:00h: call [phone]  10/15/2015 4:36 PM

## 2015-10-15 NOTE — Patient Instructions (Addendum)
ICD-9-CM ICD-10-CM   1. COPD, very severe (HCC) 496 J44.9     #COPD  Currently stable disease Continue stiolto daily; glad this is working [employer] Respect not taking pulmicort nebulizer Use albueterol as needed Repeat PFT in 3 months Flu shot in fall but respect you want to wait till we meet again in 3 months   #Followup 3 months or sooner if needed   -

## 2015-11-21 ENCOUNTER — Telehealth: Payer: Self-pay

## 2015-11-21 DIAGNOSIS — J9620 Acute and chronic respiratory failure, unspecified whether with hypoxia or hypercapnia: Secondary | ICD-10-CM

## 2015-11-21 NOTE — Telephone Encounter (Signed)
Called spoke with pt. She reports she fell and broke her hip. She is unable to get her mobility back at the moment. She is wanting to see if she could get a POC. Please advise MR thanks

## 2015-11-21 NOTE — Telephone Encounter (Signed)
Ok for POC. Plesae wish her the best for speedy recovery  Dr. Kalman Shan, M.D., [facility].C.P Pulmonary and Critical Care Medicine Staff Physician [place] [facility] [place] Pulmonary and Critical Care Pager: [phone], If no answer or between  15:00h - 7:00h: call [phone]  11/21/2015 5:35 PM

## 2015-11-22 NOTE — Telephone Encounter (Signed)
Pt returned call - [phone]

## 2015-11-22 NOTE — Telephone Encounter (Signed)
LMOMTCB x 1 

## 2015-11-22 NOTE — Telephone Encounter (Signed)
Spoke with pt, aware of recs.  poc eval ordered.  Nothing further needed.

## 2015-11-28 ENCOUNTER — Telehealth: Payer: Self-pay | Admitting: Internal Medicine

## 2015-11-28 NOTE — Telephone Encounter (Signed)
Spoke with the pt  She states she called for refill on Stiolto and was advised med needs PA again  I called the [facility] on [address] and spoke with Tobi Bastos  She states that the medication requires PA  Pt ID number [id number] [phone] Tracks  Called to initiate PA PA approved x 1 yr- approval number [id number] [facility] aid again spoke with Coralee North to inform her  She ran rx through and it was approved and she will get med ready for pick up  Pt aware and nothing further needed

## 2015-12-09 ENCOUNTER — Ambulatory Visit (INDEPENDENT_AMBULATORY_CARE_PROVIDER_SITE_OTHER): Payer: Self-pay | Admitting: Sports Medicine

## 2015-12-23 ENCOUNTER — Encounter: Payer: Self-pay | Admitting: Internal Medicine

## 2015-12-27 ENCOUNTER — Ambulatory Visit (HOSPITAL_COMMUNITY)
Admission: RE | Admit: 2015-12-27 | Discharge: 2015-12-27 | Disposition: A | Discharge status: Home / Self Care | Payer: Medicaid Other | Source: Ambulatory Visit | Attending: Family Medicine | Admitting: Family Medicine

## 2015-12-27 ENCOUNTER — Other Ambulatory Visit (HOSPITAL_COMMUNITY): Payer: Self-pay | Admitting: Family Medicine

## 2015-12-27 ENCOUNTER — Other Ambulatory Visit (HOSPITAL_COMMUNITY): Payer: Self-pay | Admitting: Orthopedic Surgery

## 2015-12-27 DIAGNOSIS — R609 Edema, unspecified: Secondary | ICD-10-CM | POA: Diagnosis not present

## 2015-12-27 DIAGNOSIS — R600 Localized edema: Secondary | ICD-10-CM | POA: Insufficient documentation

## 2015-12-27 NOTE — Progress Notes (Signed)
*  Preliminary Results* Bilateral lower extremity venous duplex completed. Bilateral lower extremities are negative for deep vein thrombosis. There is no evidence of Baker's cyst bilaterally.  12/27/2015 3:35 PM Gertie FeyMichelle Kalianne Fetting, BS, RVT, RDCS, RDMS

## 2016-01-20 ENCOUNTER — Ambulatory Visit: Payer: Self-pay | Admitting: Internal Medicine

## 2016-02-06 ENCOUNTER — Telehealth: Payer: Self-pay | Admitting: Internal Medicine

## 2016-02-06 NOTE — Telephone Encounter (Signed)
lmtcb

## 2016-02-06 NOTE — Telephone Encounter (Signed)
I wopuld recommend Please take prednisone 40 mg x1 day, then 30 mg x1 day, then 20 mg x1 day, then 10 mg x1 day, and then 5 mg x1 day and stop

## 2016-02-06 NOTE — Telephone Encounter (Signed)
Called and spoke to pt. Pt c/o slight increase in SOB, prod cough with clear mucus, and chest congestion x 2 weeks. Pt states she was at her PCP and they advised her to contact her pulmonologist. Pt denies CP/tightness and f/c/s. Pt states she feels she is improving since her s/s started but is requesting recs is necessary.   MR please advise. Thanks.

## 2016-02-07 MED ORDER — METHYLPREDNISOLONE 4 MG PO TBPK: ORAL_TABLET | ORAL | 0 refills | Status: AC

## 2016-02-07 NOTE — Telephone Encounter (Signed)
MR  Please Advise-  PT. States she has had trouble taking predisone to where after taking after two days she is unable to get any sleep. She wanted to know if anything else can be prescribed or do you think this is the best thing for her.  Below is her previous message as well as your recc.   Kalman Shan, MD      5:03 PM  Note    I wopuld recommend Please take prednisone 40 mg x1 day, then 30 mg x1 day, then 20 mg x1 day, then 10 mg x1 day, and then 5 mg x1 day and stop      Sheran Luz, RN      4:57 PM  Note    Called and spoke to pt. Pt c/o slight increase in SOB, prod cough with clear mucus, and chest congestion x 2 weeks. Pt states she was at her PCP and they advised her to contact her pulmonologist. Pt denies CP/tightness and f/c/s. Pt states she feels she is improving since her s/s started but is requesting recs is necessary.   MR please advise. Thanks.

## 2016-02-07 NOTE — Telephone Encounter (Signed)
Per MR:  Please take medrol 4mg  dosepak. Pt can respond differently to medrol than prednisone.   Called and spoke to pt. Informed her of the recs per MR. Rx sent to preferred pharmacy. Pt verbalized understanding and denied any further questions or concerns at this time.

## 2016-02-07 NOTE — Telephone Encounter (Signed)
lmtcb x2 for pt. 

## 2016-02-12 ENCOUNTER — Telehealth: Payer: Self-pay | Admitting: Internal Medicine

## 2016-02-12 NOTE — Telephone Encounter (Signed)
Pt is requesting neb solution to add in addition to stiolto, as stiolto is not working for a whole 24hr.  Pt c/o chest tightness & increased sob.  MR please advise. Thanks.

## 2016-02-12 NOTE — Telephone Encounter (Signed)
She is already on albuterol neb - if she ran out you can refill Noticed she is not on chronic inhaled steroids -you can wirte for pulmicort neb 0.25mg  bid

## 2016-02-12 NOTE — Telephone Encounter (Signed)
LMTCB

## 2016-02-13 MED ORDER — BUDESONIDE 0.25 MG/2ML IN SUSP: 0.2500 mg | Freq: Two times a day (BID) | RESPIRATORY_TRACT | 6 refills | Status: AC

## 2016-02-13 NOTE — Telephone Encounter (Signed)
She should continue stiolto -because pulmicort is a different class of med   Allergies  Allergen Reactions  . Ciprofloxacin Other (See Comments)    Lift threatening  . Codeine Nausea And Vomiting  . Levaquin [Levofloxacin In D5w] Other (See Comments)    Life threatening; caused abscess in lungs  . Oxycodone-Acetaminophen Anaphylaxis, Itching and Nausea And Vomiting    Tylox (Pt states, "I can only take Demerol, Oxycontin, and Dilaudid")  . Quinolones Hives and Other (See Comments)    Damaged nerves and tendons; If have to take antibiotic, pt could take; caused abscess in lungs  . Hydrocodone Nausea And Vomiting

## 2016-02-13 NOTE — Telephone Encounter (Signed)
Spoke with the pt and notified of recs per MR and she verbalized understanding. 

## 2016-02-13 NOTE — Telephone Encounter (Signed)
Pt aware of rec's per MR.  No refills needed at this time of Albuterol neb. Pulmicort sent to pharmacy.  Pt wants to know if she need to continue the Stiolto or stop this once she starts the Pulmicort neb.  Please advise Dr Marchelle Gearing. Thanks.

## 2016-02-21 ENCOUNTER — Telehealth: Payer: Self-pay | Admitting: Internal Medicine

## 2016-02-21 DIAGNOSIS — J438 Other emphysema: Secondary | ICD-10-CM

## 2016-02-21 NOTE — Telephone Encounter (Signed)
Spoke with who who interesting in pulm rehab. Pt states she broke her hip in June and had been down since, pt reports her lung function was low before this accident. Pt is concerned that it will continue to decline if she does not get into rehab.  MR please advise. Thanks.

## 2016-02-21 NOTE — Telephone Encounter (Signed)
Refer pulmonary rehab on basis of emphysema  Dr. Kalman Shan, M.D., [facility].C.P Pulmonary and Critical Care Medicine Staff Physician [place] [facility] [place] Pulmonary and Critical Care Pager: [phone], If no answer or between  15:00h - 7:00h: call [phone]  02/21/2016 3:53 PM

## 2016-02-21 NOTE — Telephone Encounter (Signed)
Called and spoke with pt and she is aware of order that has been placed for her to attend pulmonary rehab.  Nothing further is needed.

## 2016-03-03 ENCOUNTER — Telehealth: Payer: Self-pay | Admitting: Internal Medicine

## 2016-03-03 DIAGNOSIS — J449 Chronic obstructive pulmonary disease, unspecified: Secondary | ICD-10-CM

## 2016-03-03 NOTE — Telephone Encounter (Signed)
MR  Please Advise-   Pt. Is aware that you are unavailable until later on this week. Pt. Was upset that her appointment had to be rescheduled due to schedule changes. She states she is trying to see if she can get an order placed for her to have an ONO because she just wants to know for herself what her oxygen is doing at night.  She is also requesting an order to be placed with [facility] to receive a oximeter.

## 2016-03-03 NOTE — Telephone Encounter (Signed)
And also needs a ONO

## 2016-03-04 NOTE — Telephone Encounter (Signed)
1. Ok to order ONO on room air  2. Pulse oximenter is < $50 - sothere is no insurance coverage. She can get a good one on www.amazon.com for $20-$25. Go for anything that has more th an 4 star reviews   Thanks  Dr. Kalman Shan, M.D., [facility].C.P Pulmonary and Critical Care Medicine Staff Physician [place] [facility] [place] Pulmonary and Critical Care Pager: [phone], If no answer or between  15:00h - 7:00h: call [phone]  03/04/2016 8:36 AM

## 2016-03-04 NOTE — Telephone Encounter (Signed)
I spoke with Rod Holler with [employer], who confirmed that medicare will cover a pulse-ox with a Rx. Pt concerned about having ONO on room air and her O2 dropping too.  MR please advise if we can do this in lab, and if you are okay with sending a RX to [facility]. Thanks.

## 2016-03-04 NOTE — Telephone Encounter (Signed)
lmtcb x1 for pt. 

## 2016-03-04 NOTE — Telephone Encounter (Signed)
Ok to test in lab  Dr. Kalman Shan, M.D., [employer].C.P Pulmonary and Critical Care Medicine Staff Physician [place] [facility] [place] Pulmonary and Critical Care Pager: [phone], If no answer or between  15:00h - 7:00h: call [phone]  03/04/2016 3:31 PM

## 2016-03-04 NOTE — Telephone Encounter (Signed)
lmomtcb x1 

## 2016-03-04 NOTE — Telephone Encounter (Signed)
Patient returned call - pr  °

## 2016-03-05 NOTE — Telephone Encounter (Signed)
[phone] pt is now having a reaction to pulmicort needs a new one

## 2016-03-05 NOTE — Telephone Encounter (Signed)
Called and spoke to pt. Advised pt that ONO's cannot be done "in lab" only at home, pt verbalized understanding. Order placed for pulse ox to go to [facility]. Pt also states she cannot take Pulmicort neb or inhaler because it makes her breathing worse per pt. Pt is requesting another scheduled nebulizer medication to use in conjunction with the Stiolto. Pt using albuterol neb prn.   MR please advise. Thanks.

## 2016-03-05 NOTE — Telephone Encounter (Signed)
lmtcb x1 for pt. 

## 2016-03-05 NOTE — Telephone Encounter (Signed)
lmtcb x2 for pt. 

## 2016-03-06 NOTE — Telephone Encounter (Signed)
Dc stiolto  DuoNeb is q6h + prn   Dr. Kalman Shan, M.D., [facility].C.P Pulmonary and Critical Care Medicine Staff Physician [place] [facility] [place] Pulmonary and Critical Care Pager: [phone], If no answer or between  15:00h - 7:00h: call [phone]  03/06/2016 2:51 PM

## 2016-03-06 NOTE — Telephone Encounter (Signed)
lmtcb x1 for pt. 

## 2016-03-06 NOTE — Telephone Encounter (Signed)
Called and spoke to pt. Pt is requesting to use scheduled Duoneb instead of Stiolto.   Dr. Marchelle Gearing please advise how often you would like pt to use Duoneb. Thanks.

## 2016-03-06 NOTE — Telephone Encounter (Signed)
Patient called back - she can be reached [phone] -pr

## 2016-03-06 NOTE — Telephone Encounter (Signed)
When on stiolto (LABA/LAMA)   - pulmicort  (ICS) is the only scheduled neb that can be added. Adding another LABA/SABA  (brovana/alb)  or SAMA (atrovent) can result in more side effects.   REally the alternatives is to do scheduled duoneb instead of stiolto  Thanks  Dr. Kalman Shan, M.D., [facility].C.P Pulmonary and Critical Care Medicine Staff Physician [place] [facility] [place] Pulmonary and Critical Care Pager: [phone], If no answer or between  15:00h - 7:00h: call [phone]  03/06/2016 9:57 AM

## 2016-03-09 ENCOUNTER — Emergency Department (HOSPITAL_COMMUNITY): Payer: Medicaid Other

## 2016-03-09 ENCOUNTER — Encounter (HOSPITAL_COMMUNITY): Payer: Self-pay | Admitting: Emergency Medicine

## 2016-03-09 ENCOUNTER — Inpatient Hospital Stay (HOSPITAL_COMMUNITY): Payer: Medicaid Other

## 2016-03-09 ENCOUNTER — Inpatient Hospital Stay (HOSPITAL_COMMUNITY)
Admission: EM | Admit: 2016-03-09 | Discharge: 2016-04-02 | DRG: 871 | Disposition: E | Payer: Medicaid Other | Attending: Internal Medicine | Admitting: Internal Medicine

## 2016-03-09 ENCOUNTER — Other Ambulatory Visit: Payer: Self-pay

## 2016-03-09 DIAGNOSIS — R6521 Severe sepsis with septic shock: Secondary | ICD-10-CM | POA: Diagnosis present

## 2016-03-09 DIAGNOSIS — Z79899 Other long term (current) drug therapy: Secondary | ICD-10-CM

## 2016-03-09 DIAGNOSIS — R64 Cachexia: Secondary | ICD-10-CM | POA: Diagnosis present

## 2016-03-09 DIAGNOSIS — Z8673 Personal history of transient ischemic attack (TIA), and cerebral infarction without residual deficits: Secondary | ICD-10-CM

## 2016-03-09 DIAGNOSIS — Z87891 Personal history of nicotine dependence: Secondary | ICD-10-CM | POA: Diagnosis not present

## 2016-03-09 DIAGNOSIS — M797 Fibromyalgia: Secondary | ICD-10-CM | POA: Diagnosis present

## 2016-03-09 DIAGNOSIS — Z7951 Long term (current) use of inhaled steroids: Secondary | ICD-10-CM | POA: Diagnosis not present

## 2016-03-09 DIAGNOSIS — A419 Sepsis, unspecified organism: Secondary | ICD-10-CM | POA: Diagnosis present

## 2016-03-09 DIAGNOSIS — E872 Acidosis: Secondary | ICD-10-CM | POA: Diagnosis present

## 2016-03-09 DIAGNOSIS — E8801 Alpha-1-antitrypsin deficiency: Secondary | ICD-10-CM | POA: Diagnosis present

## 2016-03-09 DIAGNOSIS — Z9981 Dependence on supplemental oxygen: Secondary | ICD-10-CM

## 2016-03-09 DIAGNOSIS — K219 Gastro-esophageal reflux disease without esophagitis: Secondary | ICD-10-CM | POA: Diagnosis present

## 2016-03-09 DIAGNOSIS — J449 Chronic obstructive pulmonary disease, unspecified: Secondary | ICD-10-CM | POA: Diagnosis present

## 2016-03-09 DIAGNOSIS — Z825 Family history of asthma and other chronic lower respiratory diseases: Secondary | ICD-10-CM | POA: Diagnosis not present

## 2016-03-09 DIAGNOSIS — J9621 Acute and chronic respiratory failure with hypoxia: Secondary | ICD-10-CM | POA: Diagnosis present

## 2016-03-09 DIAGNOSIS — Z681 Body mass index (BMI) 19 or less, adult: Secondary | ICD-10-CM | POA: Diagnosis not present

## 2016-03-09 DIAGNOSIS — Z888 Allergy status to other drugs, medicaments and biological substances status: Secondary | ICD-10-CM

## 2016-03-09 DIAGNOSIS — I469 Cardiac arrest, cause unspecified: Secondary | ICD-10-CM | POA: Diagnosis not present

## 2016-03-09 DIAGNOSIS — F4310 Post-traumatic stress disorder, unspecified: Secondary | ICD-10-CM | POA: Diagnosis present

## 2016-03-09 DIAGNOSIS — N179 Acute kidney failure, unspecified: Secondary | ICD-10-CM | POA: Diagnosis present

## 2016-03-09 DIAGNOSIS — Z885 Allergy status to narcotic agent status: Secondary | ICD-10-CM

## 2016-03-09 DIAGNOSIS — Z66 Do not resuscitate: Secondary | ICD-10-CM | POA: Diagnosis not present

## 2016-03-09 DIAGNOSIS — Z8674 Personal history of sudden cardiac arrest: Secondary | ICD-10-CM

## 2016-03-09 DIAGNOSIS — F319 Bipolar disorder, unspecified: Secondary | ICD-10-CM | POA: Diagnosis present

## 2016-03-09 DIAGNOSIS — K55039 Acute (reversible) ischemia of large intestine, extent unspecified: Secondary | ICD-10-CM | POA: Diagnosis present

## 2016-03-09 DIAGNOSIS — Z881 Allergy status to other antibiotic agents status: Secondary | ICD-10-CM | POA: Diagnosis not present

## 2016-03-09 LAB — CBC WITH DIFFERENTIAL/PLATELET
BASOS PCT: 0 %
Basophils Absolute: 0 10*3/uL (ref 0.0–0.1)
EOS ABS: 0 10*3/uL (ref 0.0–0.7)
Eosinophils Relative: 0 %
HCT: 49 % — ABNORMAL HIGH (ref 36.0–46.0)
Hemoglobin: 15.8 g/dL — ABNORMAL HIGH (ref 12.0–15.0)
LYMPHS ABS: 3.2 10*3/uL (ref 0.7–4.0)
Lymphocytes Relative: 37 %
MCH: 30.2 pg (ref 26.0–34.0)
MCHC: 32.2 g/dL (ref 30.0–36.0)
MCV: 93.7 fL (ref 78.0–100.0)
MONO ABS: 0.6 10*3/uL (ref 0.1–1.0)
Monocytes Relative: 7 %
NEUTROS ABS: 4.8 10*3/uL (ref 1.7–7.7)
Neutrophils Relative %: 56 %
PLATELETS: 247 10*3/uL (ref 150–400)
RBC: 5.23 MIL/uL — ABNORMAL HIGH (ref 3.87–5.11)
RDW: 15.1 % (ref 11.5–15.5)
WBC: 8.6 10*3/uL (ref 4.0–10.5)

## 2016-03-09 LAB — APTT: aPTT: 34 seconds (ref 24–36)

## 2016-03-09 LAB — I-STAT ARTERIAL BLOOD GAS, ED
ACID-BASE DEFICIT: 12 mmol/L — AB (ref 0.0–2.0)
BICARBONATE: 18.9 mmol/L — AB (ref 20.0–28.0)
O2 SAT: 99 %
PO2 ART: 190 mmHg — AB (ref 83.0–108.0)
Patient temperature: 97
TCO2: 21 mmol/L (ref 0–100)
pCO2 arterial: 63.6 mmHg — ABNORMAL HIGH (ref 32.0–48.0)
pH, Arterial: 7.075 — CL (ref 7.350–7.450)

## 2016-03-09 LAB — I-STAT CHEM 8, ED
BUN: 62 mg/dL — ABNORMAL HIGH (ref 6–20)
CALCIUM ION: 0.94 mmol/L — AB (ref 1.15–1.40)
Chloride: 82 mmol/L — ABNORMAL LOW (ref 101–111)
Creatinine, Ser: 3.7 mg/dL — ABNORMAL HIGH (ref 0.44–1.00)
Glucose, Bld: 163 mg/dL — ABNORMAL HIGH (ref 65–99)
HCT: 50 % — ABNORMAL HIGH (ref 36.0–46.0)
HEMOGLOBIN: 17 g/dL — AB (ref 12.0–15.0)
POTASSIUM: 4.1 mmol/L (ref 3.5–5.1)
Sodium: 131 mmol/L — ABNORMAL LOW (ref 135–145)
TCO2: 30 mmol/L (ref 0–100)

## 2016-03-09 LAB — I-STAT TROPONIN, ED: TROPONIN I, POC: 0.04 ng/mL (ref 0.00–0.08)

## 2016-03-09 LAB — PROTIME-INR
INR: 1.22
Prothrombin Time: 15.5 seconds — ABNORMAL HIGH (ref 11.4–15.2)

## 2016-03-09 LAB — COMPREHENSIVE METABOLIC PANEL
ALK PHOS: 75 U/L (ref 38–126)
ALT: 29 U/L (ref 14–54)
ANION GAP: 34 — AB (ref 5–15)
AST: 56 U/L — ABNORMAL HIGH (ref 15–41)
Albumin: 3.8 g/dL (ref 3.5–5.0)
BUN: 49 mg/dL — ABNORMAL HIGH (ref 6–20)
CALCIUM: 9.3 mg/dL (ref 8.9–10.3)
CHLORIDE: 78 mmol/L — AB (ref 101–111)
CO2: 24 mmol/L (ref 22–32)
Creatinine, Ser: 3.75 mg/dL — ABNORMAL HIGH (ref 0.44–1.00)
GFR calc non Af Amer: 13 mL/min — ABNORMAL LOW (ref >60)
GFR, EST AFRICAN AMERICAN: 15 mL/min — AB (ref >60)
Glucose, Bld: 170 mg/dL — ABNORMAL HIGH (ref 65–99)
Potassium: 4.4 mmol/L (ref 3.5–5.1)
SODIUM: 136 mmol/L (ref 135–145)
Total Bilirubin: 1.7 mg/dL — ABNORMAL HIGH (ref 0.3–1.2)
Total Protein: 7.2 g/dL (ref 6.5–8.1)

## 2016-03-09 LAB — I-STAT CG4 LACTIC ACID, ED

## 2016-03-09 LAB — LIPASE, BLOOD: Lipase: 279 U/L — ABNORMAL HIGH (ref 11–51)

## 2016-03-09 MED ORDER — HEPARIN SODIUM (PORCINE) 5000 UNIT/ML IJ SOLN: 5000.0000 [IU] | Freq: Three times a day (TID) | INTRAMUSCULAR | Status: DC

## 2016-03-09 MED ORDER — SODIUM BICARBONATE 8.4 % IV SOLN
INTRAVENOUS | Status: DC
  Administered 2016-03-09: 06:00:00 via INTRAVENOUS
  Filled 2016-03-09 (×2): qty 150

## 2016-03-09 MED ORDER — PIPERACILLIN-TAZOBACTAM 3.375 G IVPB 30 MIN
3.3750 g | Freq: Once | INTRAVENOUS | Status: DC
  Filled 2016-03-09: qty 50

## 2016-03-09 MED ORDER — SODIUM BICARBONATE 8.4 % IV SOLN
50.0000 meq | Freq: Once | INTRAVENOUS | Status: AC
  Administered 2016-03-09: 50 meq via INTRAVENOUS

## 2016-03-09 MED ORDER — VANCOMYCIN HCL IN DEXTROSE 1-5 GM/200ML-% IV SOLN
1000.0000 mg | Freq: Once | INTRAVENOUS | Status: DC
  Filled 2016-03-09: qty 200

## 2016-03-09 MED ORDER — NOREPINEPHRINE BITARTRATE 1 MG/ML IV SOLN
5.0000 ug/min | INTRAVENOUS | Status: DC
  Administered 2016-03-09: 5 ug/min via INTRAVENOUS
  Filled 2016-03-09 (×2): qty 4

## 2016-03-09 MED ORDER — MORPHINE SULFATE (PF) 4 MG/ML IV SOLN
4.0000 mg | Freq: Once | INTRAVENOUS | Status: DC
  Filled 2016-03-09: qty 1

## 2016-03-09 MED ORDER — SODIUM CHLORIDE 0.9 % IV SOLN: 250.0000 mL | INTRAVENOUS | Status: DC | PRN

## 2016-03-09 MED ORDER — IPRATROPIUM-ALBUTEROL 0.5-2.5 (3) MG/3ML IN SOLN
RESPIRATORY_TRACT | Status: AC
  Administered 2016-03-09: 07:00:00
  Filled 2016-03-09: qty 6

## 2016-03-09 MED ORDER — SODIUM BICARBONATE 8.4 % IV SOLN
INTRAVENOUS | Status: AC | PRN
  Administered 2016-03-09: 50 meq via INTRAVENOUS

## 2016-03-09 MED ORDER — SODIUM CHLORIDE 0.9 % IV BOLUS (SEPSIS)
1000.0000 mL | Freq: Once | INTRAVENOUS | Status: AC
  Administered 2016-03-09: 1000 mL via INTRAVENOUS

## 2016-03-09 MED ORDER — FAMOTIDINE IN NACL 20-0.9 MG/50ML-% IV SOLN
20.0000 mg | Freq: Every day | INTRAVENOUS | Status: DC
Start: 2016-03-09 — End: 2016-03-09

## 2016-03-09 MED ORDER — EPINEPHRINE PF 1 MG/10ML IJ SOSY
PREFILLED_SYRINGE | INTRAMUSCULAR | Status: AC | PRN
  Administered 2016-03-09 (×3): 1 via INTRAVENOUS

## 2016-03-09 MED ORDER — ASPIRIN 300 MG RE SUPP
300.0000 mg | RECTAL | Status: AC
  Administered 2016-03-09: 300 mg via RECTAL
  Filled 2016-03-09: qty 1

## 2016-03-09 MED FILL — Medication: Qty: 1 | Status: AC

## 2016-03-10 NOTE — Telephone Encounter (Signed)
Change in pt status- will close encounter.

## 2016-03-17 ENCOUNTER — Ambulatory Visit: Payer: Self-pay | Admitting: Internal Medicine

## 2016-04-02 NOTE — Code Documentation (Signed)
Patient time of death occurred at 30

## 2016-04-02 NOTE — Code Documentation (Signed)
Family updated as to patient's status.

## 2016-04-02 NOTE — Code Documentation (Signed)
Family at beside. Family given emotional support. 

## 2016-04-02 NOTE — Progress Notes (Signed)
   04-Apr-2016 0630  Clinical Encounter Type  Visited With Family  Visit Type ED  Spiritual Encounters  Spiritual Needs Emotional;Grief support  Stress Factors  Patient Stress Factors Not reviewed  Family Stress Factors Major life changes  Paged to ED of Pt's husband in waiting area. Pt actively coding when arrived. Husband was not in area but did come back and was emotionally distraught. Brother also arrived. Pt in process of actively dying and had died before leaving area. Provided Pt placement card to Pt's brother and NOK information to nursing.

## 2016-04-02 NOTE — ED Notes (Signed)
MD notified staff that pt was now a DNR, ordered morphine.

## 2016-04-02 NOTE — H&P (Signed)
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 Reportedly EMS was called out for nausea/vomiting and found the patient ill-appearing but with a pulse. She became more distressed and lost her pulse - initial rhythm asystole. CPR was initiated and reportedly v fib was noted on pulse check. In toto she received 2 shocks and 2 epi. On arrival to the ED, she is in sinus rhythm, normotensive, oxygenating adequately on 50% FiO2 (sat 98%, pO2 190). Labs have not fully resulted, but she has a severe metabolic (lactic) acidosis with lactate >17, AKI with Cr 3.7/BUN 62, and appears hemoconcentrated with Hgb 17.   No family accompanies her. Per the ED triage note, she had been vomiting x 2 days. I was able to reach her spouse by phone. He reports that over the last several days she has been unable to eat, her stomach became quite distended, she was unable to keep anything down. She complained of abdominal pain. No bowel movements he was aware of. Subjective fever. No chills. She has been very weak and lost a lot of weight after a hip fracture a few months ago. This morning she began having more trouble breathing. She went to the doctor yesterday and was given amoxicillin and IVF with no improvement. He says that she has told him in the past to do everything possible to keep her alive, even if she is in a coma.   Shortly after return from CT, patient became profoundly hypotensive. 1 amp HCO3 given. She was then was noted to be in PEA. 1amp epi  given. On first pulse check, ROSC. Levophed arrived and was hung at . Bedside ultrasound demonstrated no frank abnormalities on sub-xiphoid view. LV function looked ok. Lung sliding present bilaterally.   PAST MEDICAL HISTORY :  She  has a past medical history of Acute ischemic colitis (HCC) (11/01/2012); Arthritis; Bipolar disorder (HCC); Chronic headache; Colonic inertia; COPD (chronic obstructive pulmonary disease) (HCC); Depression; Fibromyalgia; GERD (gastroesophageal reflux disease); Hepatomegaly; Iron deficiency anemia; Lung abscess (HCC); Marijuana abuse (2014); PTSD (post-traumatic stress disorder); Seizures (HCC); Stroke [facility] (2009); TIA (transient ischemic attack); and TIA (transient ischemic attack).  PAST SURGICAL HISTORY: She  has a past surgical history that includes Vein Surgery; Cesarean section; Esophagogastroduodenoscopy (12/23/2011); Colonoscopy; and Colonoscopy (N/A, 11/01/2012).  Allergies  Allergen Reactions  . Ciprofloxacin Other (See Comments)    Lift threatening  . Codeine Nausea And Vomiting  . Levaquin [Levofloxacin In D5w] Other (See Comments)    Life threatening; caused abscess in lungs  . Oxycodone-Acetaminophen Anaphylaxis, Itching and Nausea And Vomiting    Tylox (Pt states, "I can only take Demerol, Oxycontin, and Dilaudid")  . Quinolones Hives and Other (See Comments)    Damaged nerves and tendons; If have to take antibiotic, pt could take; caused abscess in lungs  . Hydrocodone Nausea And Vomiting    No current facility-administered medications on file prior to encounter.    Current Outpatient Prescriptions on File Prior to Encounter  Medication Sig  . albuterol (PROVENTIL) (2.5 MG/3ML) 0.083%  nebulizer solution Take 3 mLs (2.5 mg total) by nebulization every 6 (six) hours as needed for shortness of breath. For shortness of breath  . albuterol (VENTOLIN HFA) 108 (90 BASE) MCG/ACT inhaler Inhale 2 puffs into the lungs every 6 (six) hours as needed for  wheezing or shortness of breath.   . budesonide (PULMICORT) 0.25 MG/2ML nebulizer solution Take 2 mLs (0.25 mg total) by nebulization 2 (two) times daily.  . calcium citrate-vitamin D (CITRACAL+D) 315-200 MG-UNIT tablet Take 1 tablet by mouth 2 (two) times daily.  . Cholecalciferol (VITAMIN D3) 3000 units TABS Take 3,000 Units by mouth daily.  Marland Kitchen dicyclomine (BENTYL) 10 MG capsule Take 1 cap by mouth every 4-6 hrs PRN cramps  . lactose free nutrition (BOOST PLUS) LIQD Take 237 mLs by mouth 2 (two) times daily between meals.  . methylPREDNISolone (MEDROL DOSEPAK) 4 MG TBPK tablet Take as direct for 6 days.  Marland Kitchen oxyCODONE (ROXICODONE) 15 MG immediate release tablet Take 1 tablet (15 mg total) by mouth every 6 (six) hours as needed for pain.  Marland Kitchen STIOLTO RESPIMAT 2.5-2.5 MCG/ACT AERS   . VITAMIN E PO Take 1 capsule by mouth daily.    FAMILY HISTORY:  Her indicated that the status of her mother is unknown. She indicated that the status of her father is unknown. She indicated that the status of her brother is unknown. She indicated that two of her five sons are alive.    SOCIAL HISTORY: She  reports that she quit smoking about 2 years ago. Her smoking use included Cigarettes. She has a 39.00 pack-year smoking history. She has never used smokeless tobacco. She reports that she does not drink alcohol or use drugs.  REVIEW OF SYSTEMS:   Unable to obtain 2/2 intubated state  SUBJECTIVE:    VITAL SIGNS: BP 120/78   Pulse 94   Temp (!) 96.6 F (35.9 C) (Rectal)   Resp 16   Ht 5\' 5"  (1.651 m)   Wt 40.8 kg (90 lb)   LMP 05/29/2010   SpO2 98%   BMI 14.98 kg/m   HEMODYNAMICS:    VENTILATOR SETTINGS:    INTAKE / OUTPUT: No intake/output data recorded.  PHYSICAL EXAMINATION:  General Cachectic appearing, well developed, intubated, unresponsive  HEENT No gross abnormalities. OETT / OGT in place.  Pulmonary Markedly diminished breath sounds through out with occasional faint wheeze. No  rales or ronchi. Vent-assisted effort, symmetrical expansion.   Cardiovascular Normal rate, regular rhythm. S1, s2. No m/r/g. Distal pulses palpable.  Abdomen Soft, scaphoid after removal of ~2L via OGT, non-distended, absent bowel sounds, no palpable organomegaly or masses. Normoresonant to percussion.  Musculoskeletal Grossly normal  Lymphatics No cervical, supraclavicular or axillary adenopathy.   Neurologic Unresponsive on exam.   Skin/Integuement No rash, no cyanosis, no clubbing. No edema.     LABS:  BMET  Recent Labs Lab Mar 18, 2016 0442  NA 131*  K 4.1  CL 82*  BUN 62*  CREATININE 3.70*  GLUCOSE 163*    Electrolytes No results for input(s): CALCIUM, MG, PHOS in the last 168 hours.  CBC  Recent Labs Lab 03/18/2016 0442  HGB 17.0*  HCT 50.0*    Coag's  Recent Labs Lab 03/18/2016 0429  APTT 34  INR 1.22    Sepsis Markers  Recent Labs Lab 18-Mar-2016 0442  LATICACIDVEN >17.00*    ABG No results for input(s): PHART, PCO2ART, PO2ART in the last 168 hours.  Liver Enzymes No results for input(s): AST, ALT, ALKPHOS, BILITOT,  ALBUMIN in the last 168 hours.  Cardiac Enzymes No results for input(s): TROPONINI, PROBNP in the last 168 hours.  Glucose No results for input(s): GLUCAP in the last 168 hours.  Imaging Dg Chest Portable 1 View  Result Date: 03-17-2016 CLINICAL DATA:  56 y/o  F; post intubation. EXAM: PORTABLE CHEST 1 VIEW COMPARISON:  08/31/2015 chest radiograph FINDINGS: Endotracheal tube 5.6 cm from carina. Transcutaneous pacing pads noted. Normal stable cardiac silhouette. Aortic atherosclerosis with arch calcification. Left mid and lower lung zone hazy opacification and reticulation. Bones are unremarkable. Stable left upper lobe calcified granuloma. COPD and emphysema. IMPRESSION: Left mid and lower lung zone hazy opacification and reticulation, possibly pneumonia, aspiration, or asymmetric edema. Endotracheal tube in satisfactory position.  Electronically Signed   By: Mitzi Hansen M.D.   On: 03/17/16 04:50   STUDIES:    CULTURES: Blood 2016/03/17 >> Urine 2016-03-17 >>  ANTIBIOTICS: Vancomycin 1/8 >> Zosyn 1/8 >>  SIGNIFICANT EVENTS:   LINES/TUBES: OETT 1/8 OGT 1/8 PIV   Foley 1/8  DISCUSSION: Ms. Fanton is a 11F with very severe obstructive lung disease 2/2 A1AT (MM genotype) with FEV1 17% predicted (0.51L) in 12/2014, hx ischemic colitis, limited mobility, prior TIAs, depression, fibromyalgia, presenting with cardiac arrest after reported illness with nausea and vomiting. Initial rhythm asystole followed by v fib. ECG without ischemic changes and initial troponin 0.04.    ASSESSMENT / PLAN:  PULMONARY A: Acute on chronic hypoxemic respiratory failure Very severe COPD 2/2 A1AT (FEV1 17% pred) P:   Continue ventilatory support Wean as tolerated  CARDIOVASCULAR A:  Cardiac arrest - asystole to vfib Hypotension P:  Continue supportive measures Fluid resuscitate Pressors as needed to maintain MAP > 65 No cooling given concern for sepsis and acute GI pathology  RENAL A:   Acute renal failure (Cr 3.7 from baseline 0.6) Severe metabolic (lactic) acidosis Electrolyte derangement 2/2 vomiting, AKI P:   Avoid nephrotoxins Fluid resuscitate Trend Cr Trend lactate Renally dose meds HCO3 gtt  GASTROINTESTINAL A:   Concern for bowel obstruction vs acute gastroenteritis P:   Check GI pathogen panel Follow up CT abdomen NPO Continue OGT to LIWS Pending CT may need surgery c/s  HEMATOLOGIC A:   No acute issues P:  DVT ppx  INFECTIOUS A:   Sepsis 2/2 presumed GI source P:   Continue empiric abx with vanc/zosyn Follow cultures Trend lactate  ENDOCRINE A:   No acute issues   P:   CBGs  NEUROLOGIC A:   Altered mental status 2/2 sepsis and cardiac arrest  P:   RASS goal: 0 Follow mental status exam   FAMILY  - Updates: Spoke with significant other, Bettey Costa, by phone  (listed as her emergency contact). He states patient was very clear that she wants to be FULL CODE.  - Inter-disciplinary family meet or Palliative Care meeting due by:  day 7  The patient is critically ill with multiple organ system failure and requires high complexity decision making for assessment and support, frequent evaluation and titration of therapies, advanced monitoring, review of radiographic studies and interpretation of complex data.   Critical Care Time devoted to patient care services, exclusive of separately billable procedures, described in this note is 62 minutes.   Nita Sickle, MD Pulmonary and Critical Care Medicine [facility] Pager: [phone]  03/17/16, 5:05 AM

## 2016-04-02 NOTE — Telephone Encounter (Signed)
lmtcb X2 for pt.  

## 2016-04-02 NOTE — Code Documentation (Signed)
Organ procurement team notified. [phone] [name] - rep

## 2016-04-02 NOTE — ED Provider Notes (Addendum)
[facility]-EMERGENCY DEPT Provider Note   CSN: [id number] Arrival date & time: 03-16-2016  0426     History   Chief Complaint Chief Complaint  Patient presents with  . Loss of Consciousness    HPI [name] is a 56 y.o. female.  HPI  This a 57 year old female who presents post arrest. Per EMS, they were called out for vomiting. Patient initially ill-appearing but had a pulse. She got more and more distress and became pulseless and was initially in asystole. She got a total of 10-12 minutes of CPR with 2 rounds of epinephrine. On initial pulse check, she was noted to be in V. fib. She received 2 total shocks. EKG now in sinus rhythm. No additional history attainable at this time. Per chart review has a history of COPD, ischemic colitis.  Past Medical History:  Diagnosis Date  . Acute ischemic colitis (HCC) 11/01/2012  . Arthritis   . Bipolar disorder (HCC)   . Chronic headache   . Colonic inertia   . COPD (chronic obstructive pulmonary disease) (HCC)   . Depression   . Fibromyalgia   . GERD (gastroesophageal reflux disease)   . Hepatomegaly    hx  . Iron deficiency anemia   . Lung abscess (HCC)    "calcified over"; bilateral   . Marijuana abuse 2014   positive screen in hospital  . PTSD (post-traumatic stress disorder)   . Seizures (HCC)    in past  . Stroke [facility]) 2009   TIA  . TIA (transient ischemic attack)   . TIA (transient ischemic attack)     Patient Active Problem List   Diagnosis Date Noted  . Septic shock (HCC) 16-Mar-2016  . Femoral neck fracture, right, closed, initial encounter 08/19/2015  . History of smoking 07/09/2015  . COPD, very severe (HCC) 05/14/2015  . Nodule of left lung 05/14/2015  . Chronic respiratory failure (HCC) 12/27/2014  . Right lower quadrant abdominal pain   . Blood in stool 03/07/2014  . Melena 03/07/2014  . Lower GI bleed 03/07/2014  . Sepsis (HCC) 03/07/2014  . Leg edema, left 03/07/2014  . Abdominal pain, generalized   .  Abnormal CT scan, colon   . Malnutrition of moderate degree (HCC) 12/16/2013  . Acute respiratory failure with hypoxia and hypercapnia (HCC) 12/13/2013  . Generalized anxiety disorder 04/03/2013  . Tobacco abuse 04/03/2013  . Acute bronchitis due to human metapneumovirus 03/25/2013  . Acute on chronic respiratory failure (HCC) 03/25/2013  . Febrile illness, acute 03/22/2013  . COPD exacerbation (HCC) 03/22/2013  . Hyponatremia 03/22/2013  . Dehydration 03/22/2013  . Acute ischemic colitis (HCC) 11/01/2012  . Protein-calorie malnutrition, severe (HCC) 10/31/2012  . Hematochezia 10/31/2012  . Abdominal pain 10/30/2012  . Colitis 10/30/2012  . Chronic abdominal pain 10/14/2012  . Chronic constipation 10/14/2012  . Chronic nausea 10/14/2012  . Nausea with vomiting 12/23/2011  . Epigastric pain 10/27/2011  . Nausea 10/27/2011  . Dysphagia 10/27/2011  . BIPOLAR DISORDER UNSPECIFIED 04/18/2010  . HEPATOMEGALY, HX OF 04/18/2010  . ANEMIA-IRON DEFICIENCY 11/20/2009  . GERD 11/20/2009  . POST TRAUMATIC STRESS SYNDROME 09/24/2008  . COPD (chronic obstructive pulmonary disease) (HCC) 09/24/2008  . HEADACHE, CHRONIC 09/24/2008  . ANGINA, HX OF 09/24/2008  . CT, CHEST, ABNORMAL 09/24/2008    Past Surgical History:  Procedure Laterality Date  . CESAREAN SECTION     x 2  . COLONOSCOPY    . COLONOSCOPY N/A 11/01/2012   Procedure: COLONOSCOPY;  Surgeon: Iva Boop, MD;  Location: [facility];  Service: Endoscopy;  Laterality: N/A;  . ESOPHAGOGASTRODUODENOSCOPY  12/23/2011   Procedure: ESOPHAGOGASTRODUODENOSCOPY (EGD);  Surgeon: Hart Carwin, MD;  Location: [facility];  Service: Endoscopy;  Laterality: N/A;  . 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 Authorizing Provider  albuterol (PROVENTIL) (2.5 MG/3ML)  0.083% nebulizer solution Take 3 mLs (2.5 mg total) by nebulization every 6 (six) hours as needed for shortness of breath. For shortness of breath 12/20/13   Alison Murray, MD  albuterol (VENTOLIN HFA) 108 (90 BASE) MCG/ACT inhaler Inhale 2 puffs into the lungs every 6 (six) hours as needed for wheezing or shortness of breath.     Historical Provider, MD  budesonide (PULMICORT) 0.25 MG/2ML nebulizer solution Take 2 mLs (0.25 mg total) by nebulization 2 (two) times daily. 02/13/16   Kalman Shan, MD  calcium citrate-vitamin D (CITRACAL+D) 315-200 MG-UNIT tablet Take 1 tablet by mouth 2 (two) times daily.    Historical Provider, MD  Cholecalciferol (VITAMIN D3) 3000 units TABS Take 3,000 Units by mouth daily.    Historical Provider, MD  dicyclomine (BENTYL) 10 MG capsule Take 1 cap by mouth every 4-6 hrs PRN cramps 04/25/14   Hart Carwin, MD  lactose free nutrition (BOOST PLUS) LIQD Take 237 mLs by mouth 2 (two) times daily between meals. 04/03/13   Nishant Dhungel, MD  methylPREDNISolone (MEDROL DOSEPAK) 4 MG TBPK tablet Take as direct for 6 days. 02/07/16   Kalman Shan, MD  oxyCODONE (ROXICODONE) 15 MG immediate release tablet Take 1 tablet (15 mg total) by mouth every 6 (six) hours as needed for pain. 08/21/15   Starleen Arms, MD  STIOLTO RESPIMAT 2.5-2.5 MCG/ACT AERS  10/02/15   Historical Provider, MD  VITAMIN E PO Take 1 capsule by mouth daily.    Historical Provider, MD    Family History Family History  Problem Relation Age of Onset  . Emphysema Father   . Asthma Son   . Clotting disorder Mother   . Rheum arthritis Sister   . Rheum arthritis Brother   . Ovarian cancer Sister     Social History Social History  Substance Use Topics  . Smoking status: Former Smoker    Packs/day: 1.00    Years: 39.00    Types: Cigarettes    Quit date: 01/30/2014  . Smokeless tobacco: Never Used     Comment: has not smoked in 6 days 07/27/14  . Alcohol use No     Allergies    Ciprofloxacin; Codeine; Levaquin [levofloxacin in d5w]; Oxycodone-acetaminophen; Quinolones; and Hydrocodone   Review of Systems Review of Systems  Unable to perform ROS: Intubated     Physical Exam Updated Vital Signs BP 121/98   Pulse (!) 29   Temp (!) 90.1 F (32.3 C)   Resp 12   Ht 5\' 5"  (1.651 m)   Wt 90 lb (40.8 kg)   LMP 05/29/2010   SpO2 (!) 33%   BMI 14.98 kg/m   Physical Exam  Constitutional:  Ill-appearing, unresponsive, occasional apniec breath, Cachetic  HENT:  Head: Normocephalic and atraumatic.  Mucous membranes dry  Eyes:  Pupils 4 mm and minimally reactive  Cardiovascular: Normal rate, regular rhythm and normal heart sounds.   No murmur  heard. Pulmonary/Chest: She has no wheezes.  Intubated, diminished breath sounds bilaterally  Abdominal: Bowel sounds are normal. She exhibits distension.  Distended, hard to palpation  Musculoskeletal: She exhibits no edema.  Neurological:  GCS 3  Skin: Skin is warm and dry.  Nursing note and vitals reviewed.    ED Treatments / Results  Labs (all labs ordered are listed, but only abnormal results are displayed) Labs Reviewed  CBC WITH DIFFERENTIAL/PLATELET - Abnormal; Notable for the following:       Result Value   RBC 5.23 (*)    Hemoglobin 15.8 (*)    HCT 49.0 (*)    All other components within normal limits  COMPREHENSIVE METABOLIC PANEL - Abnormal; Notable for the following:    Chloride 78 (*)    Glucose, Bld 170 (*)    BUN 49 (*)    Creatinine, Ser 3.75 (*)    AST 56 (*)    Total Bilirubin 1.7 (*)    GFR calc non Af Amer 13 (*)    GFR calc Af Amer 15 (*)    Anion gap 34 (*)    All other components within normal limits  LIPASE, BLOOD - Abnormal; Notable for the following:    Lipase 279 (*)    All other components within normal limits  PROTIME-INR - Abnormal; Notable for the following:    Prothrombin Time 15.5 (*)    All other components within normal limits  I-STAT CG4 LACTIC ACID, ED -  Abnormal; Notable for the following:    Lactic Acid, Venous >17.00 (*)    All other components within normal limits  I-STAT CHEM 8, ED - Abnormal; Notable for the following:    Sodium 131 (*)    Chloride 82 (*)    BUN 62 (*)    Creatinine, Ser 3.70 (*)    Glucose, Bld 163 (*)    Calcium, Ion 0.94 (*)    Hemoglobin 17.0 (*)    HCT 50.0 (*)    All other components within normal limits  I-STAT ARTERIAL BLOOD GAS, ED - Abnormal; Notable for the following:    pH, Arterial 7.075 (*)    pCO2 arterial 63.6 (*)    pO2, Arterial 190.0 (*)    Bicarbonate 18.9 (*)    Acid-base deficit 12.0 (*)    All other components within normal limits  URINE CULTURE  CULTURE, BLOOD (ROUTINE X 2)  CULTURE, BLOOD (ROUTINE X 2)  APTT  URINALYSIS, ROUTINE W REFLEX MICROSCOPIC  CBC  CREATININE, SERUM  I-STAT TROPOININ, ED  CBG MONITORING, ED  I-STAT CG4 LACTIC ACID, ED    EKG  EKG Interpretation None       Radiology Ct Abdomen Pelvis Wo Contrast  Result Date: 2016-03-26 CLINICAL DATA:  Abdominal distention and vomiting. Patient arrested. CPR for 10-12 minutes. Possible septic shock. EXAM: CT ABDOMEN AND PELVIS WITHOUT CONTRAST TECHNIQUE: Multidetector CT imaging of the abdomen and pelvis was performed following the standard protocol without IV contrast. COMPARISON:  03/07/2014 FINDINGS: Lower chest: Emphysematous changes in the lungs. Diffuse interstitial changes in the lung bases may represent pneumonia, aspiration, or edema. The NG tube is in place. Hepatobiliary: No focal liver abnormality is seen. No gallstones, gallbladder wall thickening, or biliary dilatation. Pancreas: Unremarkable. No pancreatic ductal dilatation or surrounding inflammatory changes. Spleen: Calcified granulomas in the spleen.  No enlargement. Adrenals/Urinary Tract: Adrenal glands are unremarkable. Kidneys are normal, without renal calculi, focal lesion, or hydronephrosis. Foley catheter in the bladder. Gas fills the bladder,  likely related to catheter insertion. No bladder wall thickening appreciated. Stomach/Bowel: Stomach, small bowel, and colon are not abnormally distended. Scattered stool in the colon. Small amount of residual ingested material in the stomach. Under distention of bowel limits evaluation. Vascular/Lymphatic: Extensive vascular calcifications mostly involving the iliac arteries bilaterally. No aortic aneurysm. Inferior vena cava is normal. No significant lymphadenopathy. Reproductive: Uterus and bilateral adnexa are unremarkable. Other: Small amount of free fluid in the abdomen is nonspecific but may be reactive. Musculoskeletal: Mild lumbar scoliosis convex towards the right. Degenerative changes in the hips, greater on the right. IMPRESSION: No acute process demonstrated in the abdomen or pelvis. No evidence of bowel obstruction or inflammation. No abscess. Lung bases demonstrate diffuse interstitial infiltrates suggesting pneumonia, edema, or aspiration. Electronically Signed   By: Burman Nieves M.D.   On: 17-Mar-2016 06:27   Dg Chest Portable 1 View  Result Date: 03/17/2016 CLINICAL DATA:  56 y/o  F; post intubation. EXAM: PORTABLE CHEST 1 VIEW COMPARISON:  08/31/2015 chest radiograph FINDINGS: Endotracheal tube 5.6 cm from carina. Transcutaneous pacing pads noted. Normal stable cardiac silhouette. Aortic atherosclerosis with arch calcification. Left mid and lower lung zone hazy opacification and reticulation. Bones are unremarkable. Stable left upper lobe calcified granuloma. COPD and emphysema. IMPRESSION: Left mid and lower lung zone hazy opacification and reticulation, possibly pneumonia, aspiration, or asymmetric edema. Endotracheal tube in satisfactory position. Electronically Signed   By: Mitzi Hansen M.D.   On: Mar 17, 2016 04:50    Procedures Procedures (including critical care time)  CRITICAL CARE Performed by: Shon Baton   Total critical care time: 60 minutes  Critical  care time was exclusive of separately billable procedures and treating other patients.  Critical care was necessary to treat or prevent imminent or life-threatening deterioration.  Critical care was time spent personally by me on the following activities: development of treatment plan with patient and/or surrogate as well as nursing, discussions with consultants, evaluation of patient's response to treatment, examination of patient, obtaining history from patient or surrogate, ordering and performing treatments and interventions, ordering and review of laboratory studies, ordering and review of radiographic studies, pulse oximetry and re-evaluation of patient's condition.   Medications Ordered in ED Medications  vancomycin (VANCOCIN) IVPB 1000 mg/200 mL premix (not administered)  piperacillin-tazobactam (ZOSYN) IVPB 3.375 g (not administered)  0.9 %  sodium chloride infusion (not administered)  heparin injection 5,000 Units (not administered)  famotidine (PEPCID) IVPB 20 mg premix (not administered)  norepinephrine (LEVOPHED) 4 mg in dextrose 5 % 250 mL (0.016 mg/mL) infusion (20 mcg/min Intravenous Rate/Dose Change Mar 17, 2016 0646)  sodium bicarbonate 150 mEq in dextrose 5 % 1,000 mL infusion ( Intravenous New Bag/Given 03/17/2016 0615)  morphine 4 MG/ML injection 4 mg (not administered)  aspirin suppository 300 mg (300 mg Rectal Given Mar 17, 2016 0508)  sodium chloride 0.9 % bolus 1,000 mL (0 mLs Intravenous Stopped 03/17/16 0644)  sodium chloride 0.9 % bolus 1,000 mL (0 mLs Intravenous Stopped 2016/03/17 0515)  sodium chloride 0.9 % bolus 1,000 mL (1,000 mLs Intravenous New Bag/Given 2016/03/17 0515)  sodium bicarbonate injection 50 mEq (50 mEq Intravenous Given 03/17/16 0557)  ipratropium-albuterol (DUONEB) 0.5-2.5 (3) MG/3ML nebulizer solution (  Given 17-Mar-2016 0650)     Initial Impression / Assessment and Plan / ED Course  I have reviewed the triage vital signs and the nursing notes.  Pertinent labs &  imaging results that were available during my care of the patient were reviewed by me and considered in my medical  decision making (see chart for details).  Clinical Course     Patient presents with cardiac arrest. Approximately 10 minutes of ACLS. Seems to be precipitated by one day of vomiting and abdominal pain. History of ischemic colitis.  Feel patient likely had a medical event that precipitated her rest. She is ill-appearing and intubated. Lactate >17.  Sepsis alert was initiated. Patient was not cooled as it appears her EKG is reassuring and this is likely a medical arrest.  She was treated with broad-spectrum antibiotics and fluids. Given fluids. Creatinine acutely elevated at greater than 3. This is new. PE is also consideration; however given her abdominal exam, suspect intra-abdominal pathology. Critical care consulted and evaluated the patient. She is full code per her husband.  6:40 AM  Cardiopulmonary Resuscitation (CPR) Procedure Note Directed/Performed by: Shon Baton I personally directed ancillary staff and/or performed CPR in an effort to regain return of spontaneous circulation and to maintain cardiac, neuro and systemic perfusion.   This is patient's cardiac arrest while in the emergency room. PEA. She is hypoxic.  She was coded for 5 minutes with 2 rounds of epi and bicarbonate. She had a prior short arrest before the fellow left for the morning.  7:00 AM Patient has a very poor prognosis. No neurologic function or meaningful exam. I discussed this with the patient's husband wishes that she remained comfortable. I discussed with him and I felt like further CPR was likely futile. He agrees. Will make her comfortable. Patient was given morphine.  7:04 AM Time of death 64.  Final Clinical Impressions(s) / ED Diagnoses   Final diagnoses:  Cardiac arrest (HCC)  Septic shock (HCC)  Acute renal failure, unspecified acute renal failure type [facility])    New  Prescriptions New Prescriptions   No medications on file     Shon Baton, MD 04/06/16 [id number]    Shon Baton, MD 04/06/16 [phone]

## 2016-04-02 NOTE — Discharge Summary (Signed)
Physician Discharge Summary       Patient ID: [name] MRN: [id number] DOB/AGE: 56-29-1962 56 y.o.  Admit date: 03/30/16 Discharge date: 03/22/2016  Discharge Diagnoses:  S/p cardiac arrest Acute nausea/vomiting Acute respiratory failure 2/2 altered mental status and cardiac arrest   Detailed Hospital Course:   The patient presented the morning of 03/30/16 from home via EMS after a witnessed cardiac arrest. Reportedly EMS was called out for nausea/vomiting and found the patient ill-appearing but with a pulse. She became more distressed and lost her pulse - initial rhythm asystole. CPR was initiated and reportedly v fib was noted on pulse check. In toto she received 2 shocks and 2 epi. On arrival to the ED, she was in sinus rhythm, normotensive, oxygenating adequately on 50% FiO2 (sat 98%, pO2 190). Labs revealed a profound metabolic acidosis and acute renal failure. She was sent for a CT scan and shortly after return from CT, she became profoundly hypotensive. Fluids and 1 amp HCO3 were given and pressors were ordered. She was then was noted to be in PEA. 1amp epi given. On first pulse check, ROSC. Levophed arrived and was hung at . Bedside ultrasound demonstrated no frank abnormalities on sub-xiphoid view. Despite bicarbonate gtt, pressors, antibiotics and full ventilatory support, she again became pulseless. Her significant other arrived while coding was in process and she was made a DNR. The ED physician was notified of the change in code status and no further attempts were made at resuscitation. The patient expired at 0704 AM on 30-Mar-2016.    Significant Hospital tests/ studies  CT Abd/pelvis - no acute process CXR - bilateral infiltrates  Consults - none  Discharge Exam: BP 121/98   Pulse (!) 29   Temp (!) 90.1 F (32.3 C)   Resp 12   Ht 5\' 5"  (1.651 m)   Wt 40.8 kg (90 lb)   LMP 05/29/2010   SpO2 (!) 33%   BMI 14.98 kg/m   No spontaneous movement No response to  noxious stimuli No spontaneous respirations No heart sounds No palpable pulse   Labs at discharge Lab Results  Component Value Date   CREATININE 3.70 (H) 2016-03-30   BUN 62 (H) 2016-03-30   NA 131 (L) 2016-03-30   K 4.1 March 30, 2016   CL 82 (L) Mar 30, 2016   CO2 24 03-30-16   Lab Results  Component Value Date   WBC 8.6 30-Mar-2016   HGB 17.0 (H) 03-30-2016   HCT 50.0 (H) 2016-03-30   MCV 93.7 2016/03/30   PLT 247 2016-03-30   Lab Results  Component Value Date   ALT 29 March 30, 2016   AST 56 (H) Mar 30, 2016   ALKPHOS 75 03-30-16   BILITOT 1.7 (H) 03-30-16   Lab Results  Component Value Date   INR 1.22 2016/03/30   INR 0.95 08/19/2015   INR 1.05 03/07/2014    Current radiology studies No results found.  Allergies/Medications Not applicable; patient deceased   Disposition:  56-Expired  Discharged Condition: deceased  Signed: Nita Sickle, MD Pulmonary and Critical Care 03/22/16 7:24 PM

## 2016-04-02 NOTE — Progress Notes (Signed)
Transported to CT without any issues. Back in room.

## 2016-04-02 DEATH — deceased

## 2016-04-06 ENCOUNTER — Ambulatory Visit: Payer: Self-pay | Admitting: Internal Medicine

## 2016-04-12 IMAGING — CR DG CHEST 1V PORT
1 series · 1 of 1 positions shown · non-contrast
Comparison: 06/21/2013; 03/26/2013; chest CT- 05/24/2013

CLINICAL DATA: COPD, shortness of breath, sinus infection

EXAM:
PORTABLE CHEST - 1 VIEW

[AP]
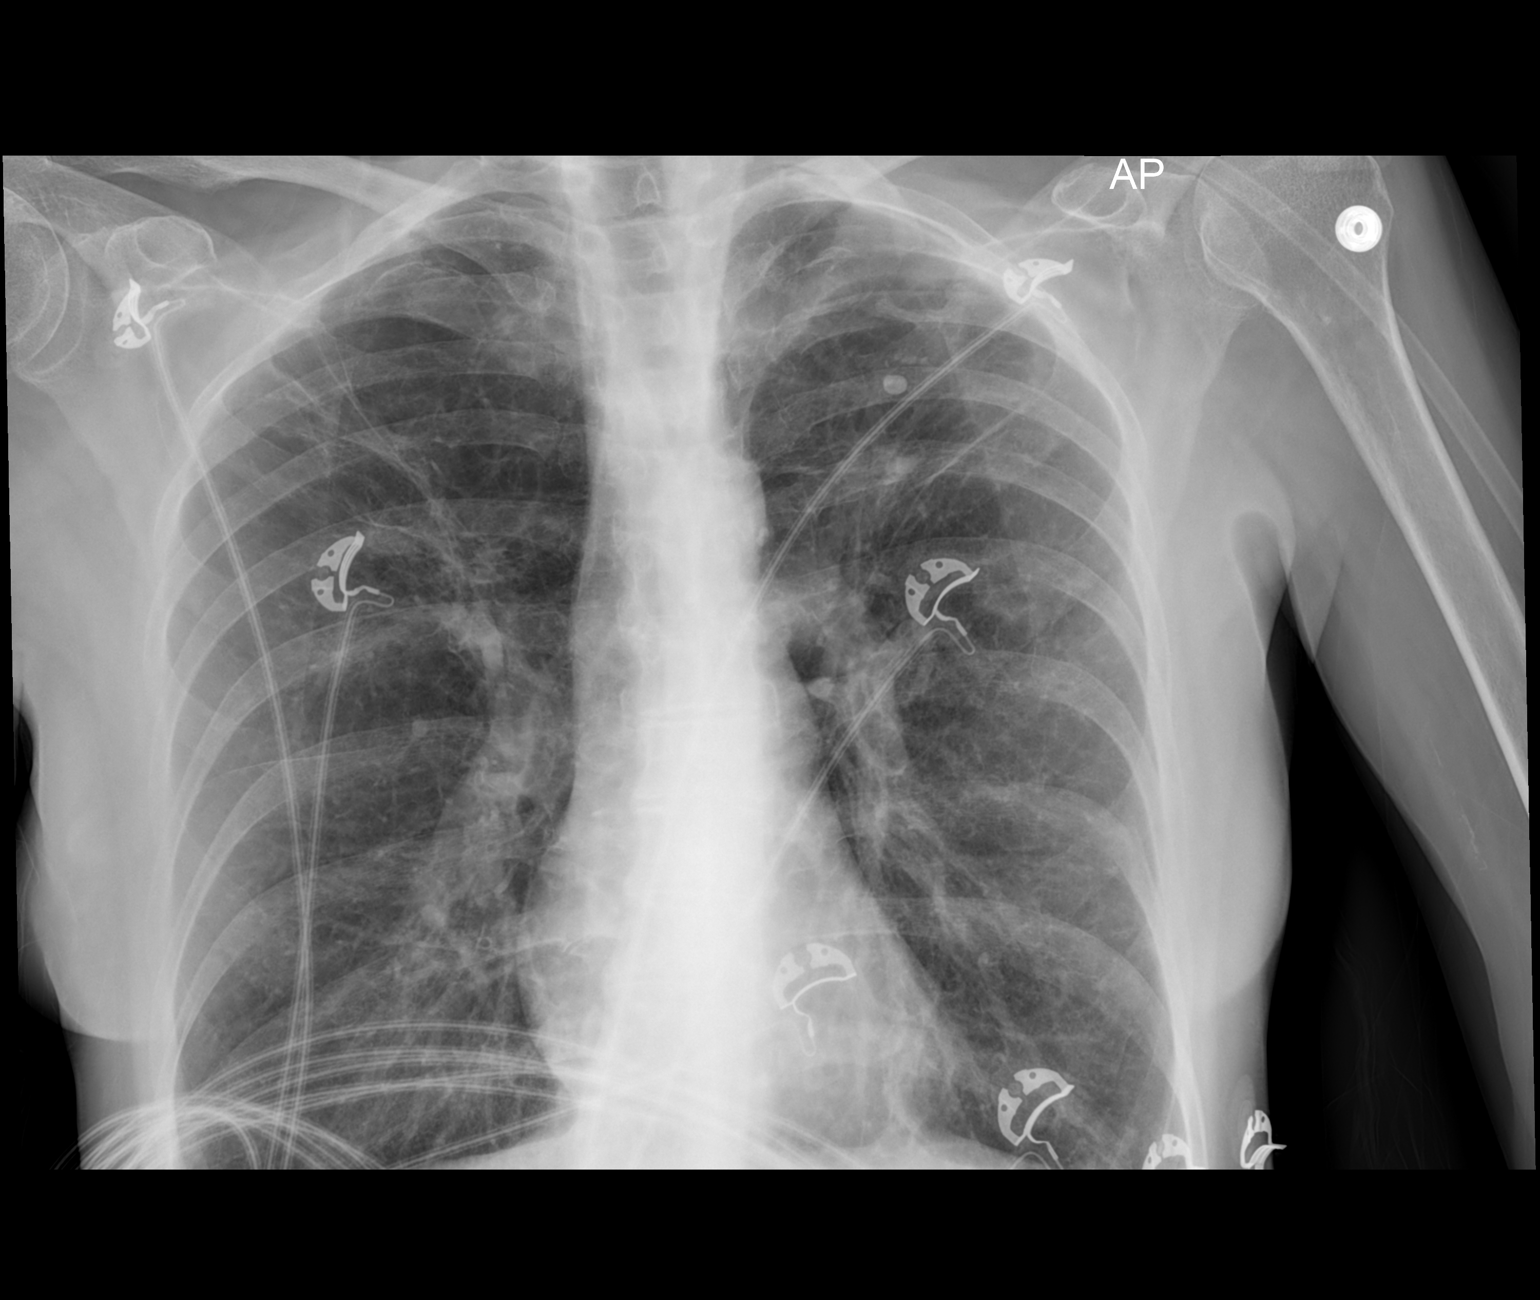

[1 of 1 positions shown; findings below may reference images not displayed]

FINDINGS: Grossly unchanged cardiac silhouette and mediastinal contours the
lungs remain hyperexpanded. Unchanged punctate granuloma within the
left upper lung. Linear heterogeneous opacities within the right
upper lung are unchanged and favored to represent subsegmental
atelectasis. No new focal airspace opacities. No pleural effusion or
pneumothorax. No evidence of edema. No acute osseus abnormalities.
IMPRESSION: Hyperexpanded lungs without acute cardiopulmonary disease.

## 2017-11-29 IMAGING — CR DG HAND COMPLETE 3+V*L*
3 series · 3 of 3 positions shown · non-contrast
Comparison: None.

CLINICAL DATA: Trip over oxygen tank this morning. Left hand pain
and bruising.

EXAM:
LEFT HAND - COMPLETE 3+ VIEW

[x hand pa left]
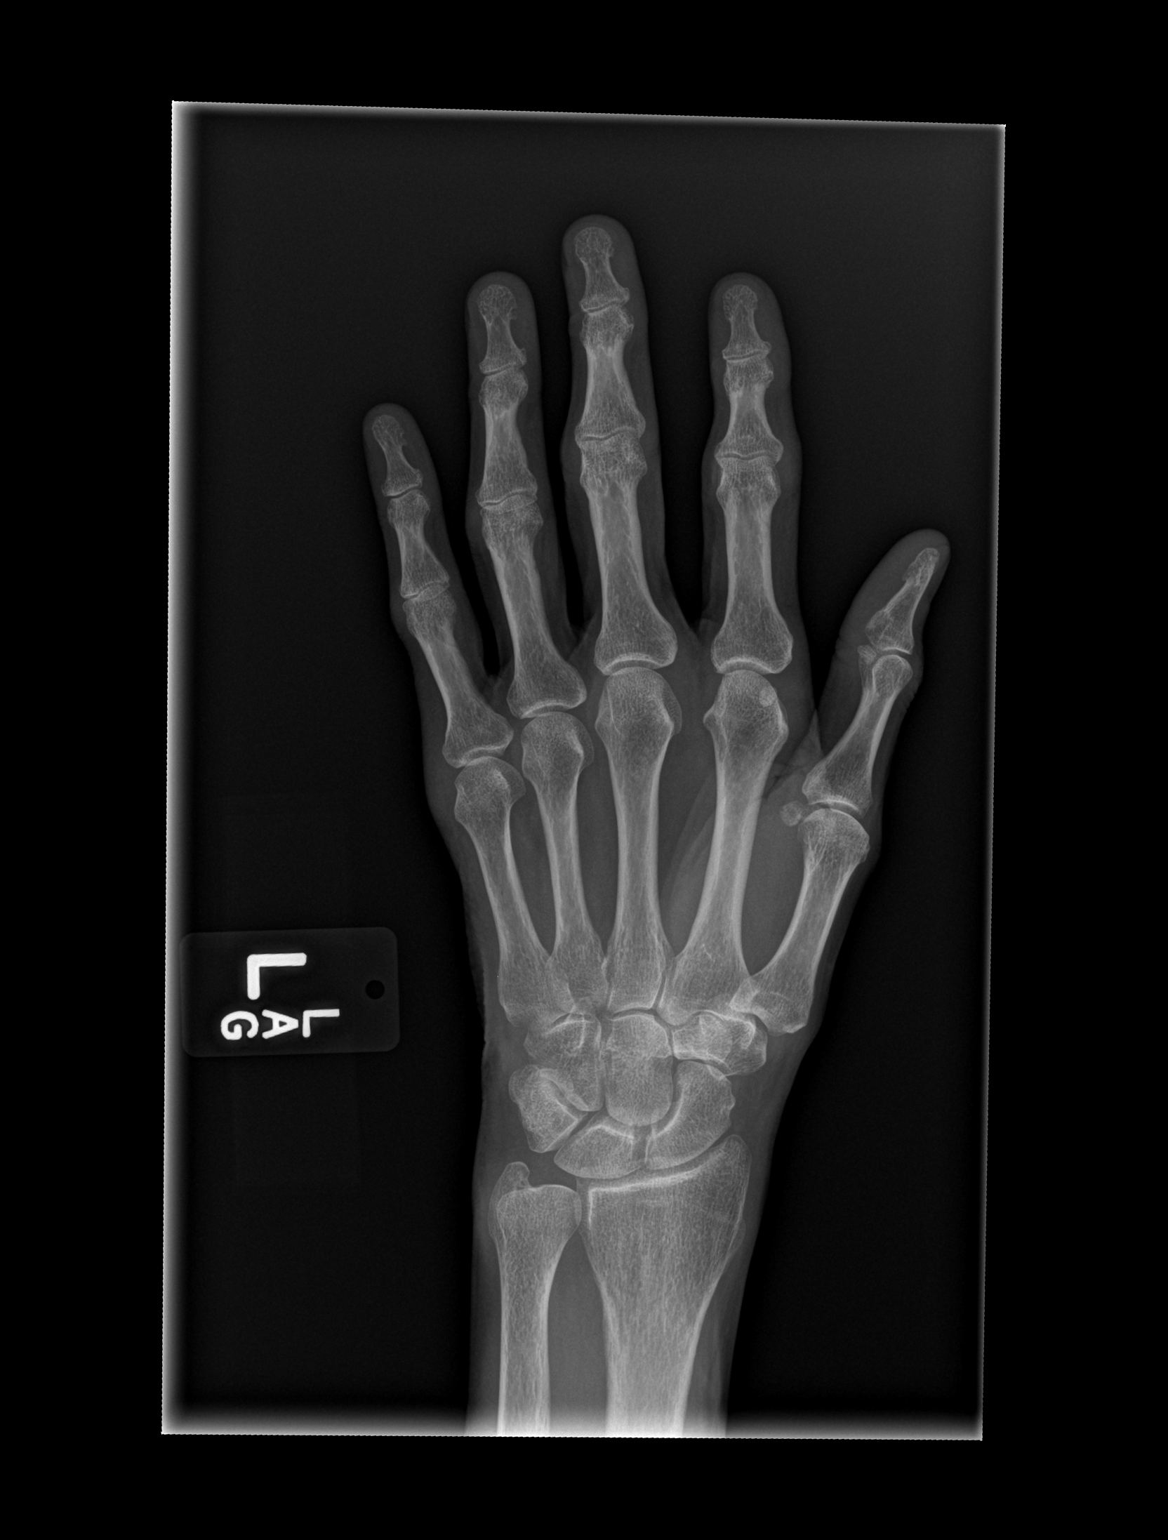

[x hand obl left]
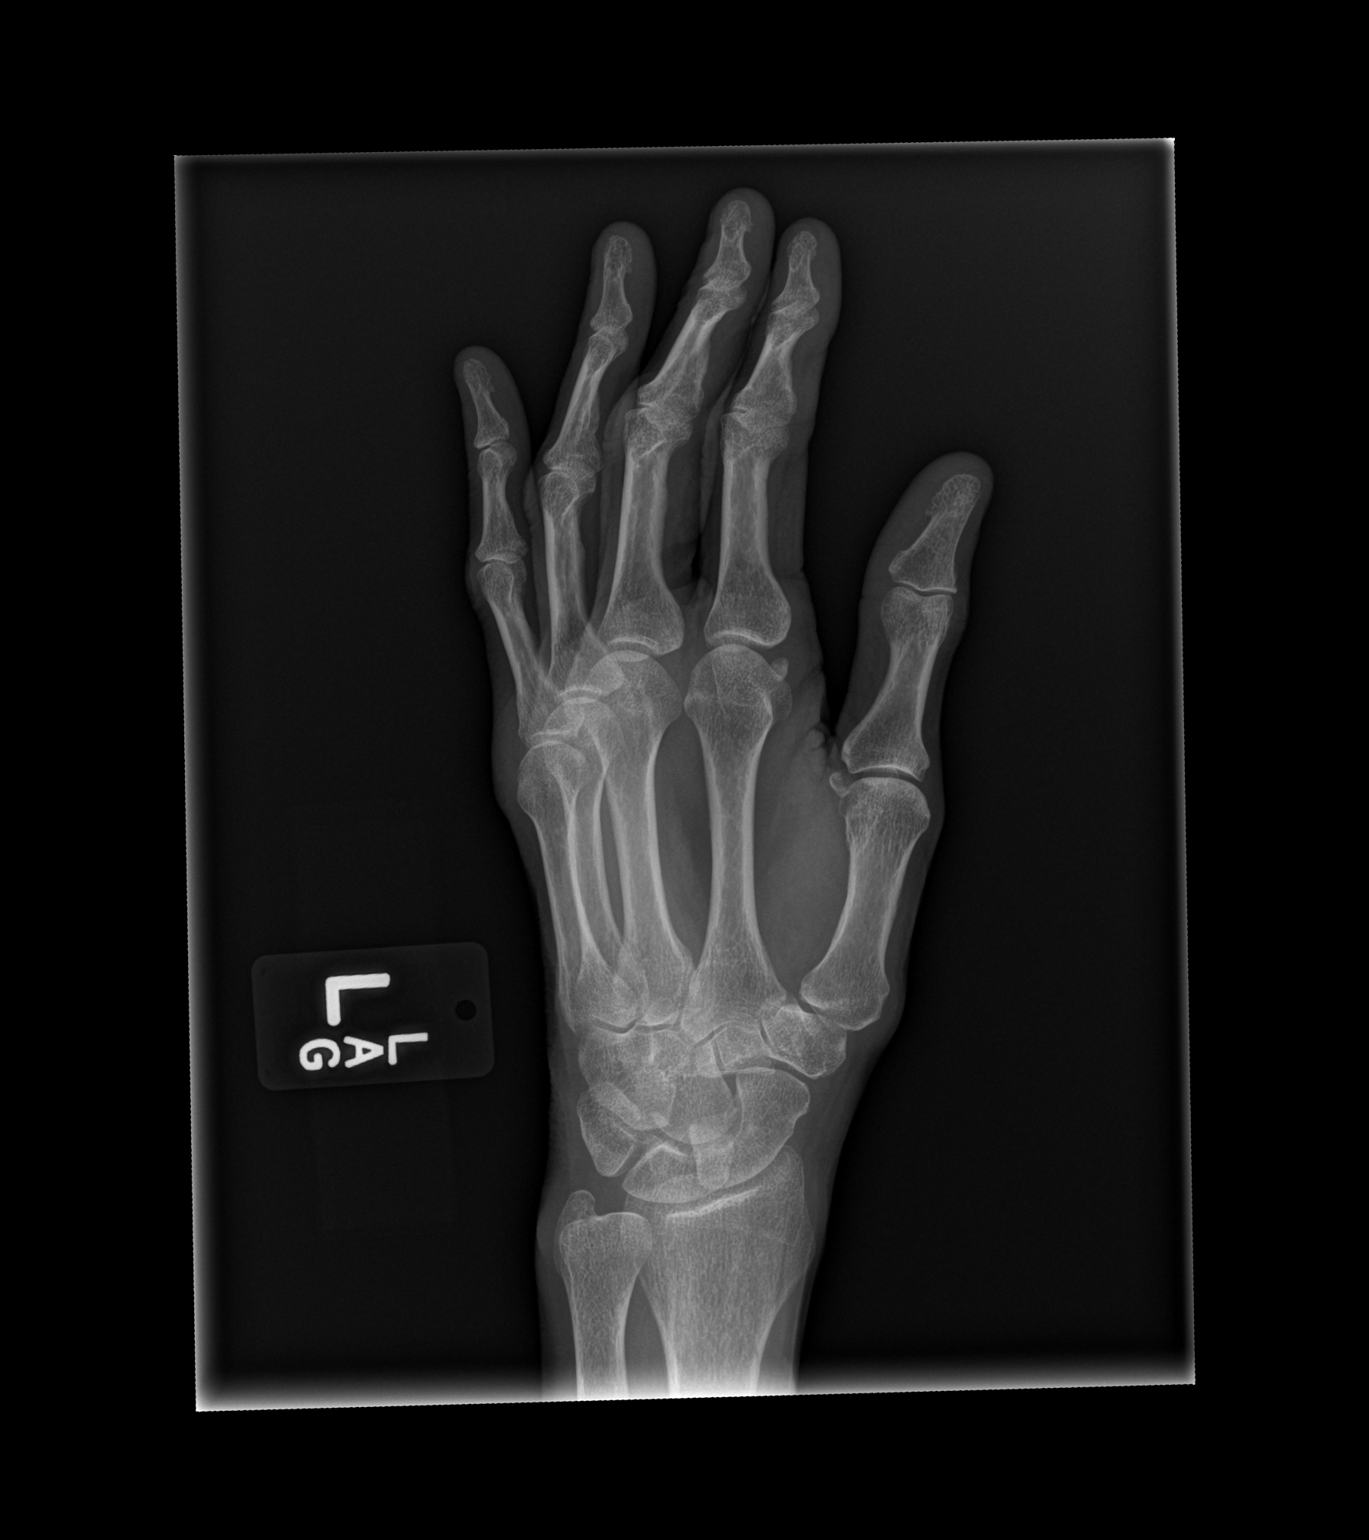

[x hand lat left]
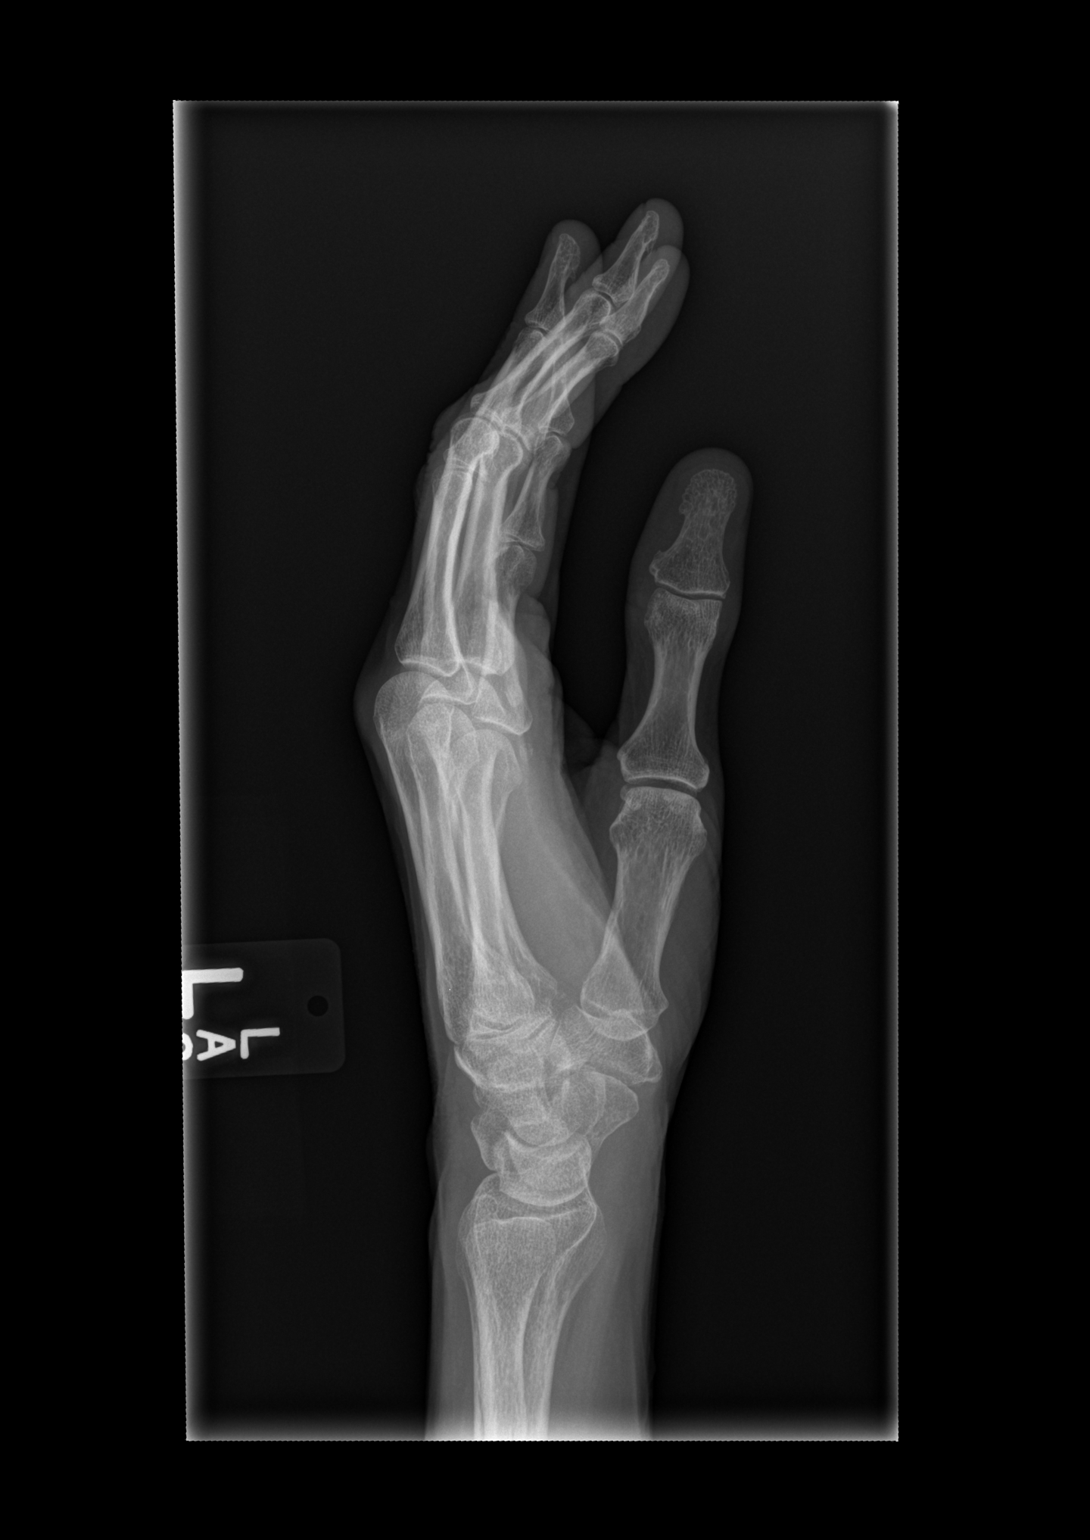

[3 of 3 positions shown; findings below may reference images not displayed]

FINDINGS: There is no evidence of fracture or dislocation. There is no
evidence of arthropathy or other focal bone abnormality. Soft
tissues are unremarkable.
IMPRESSION: Negative radiographs of the left hand.
